# Patient Record
Sex: Male | Born: 1970
Health system: Southern US, Community
[De-identification: ages and names within clinical notes are randomized; demographics above are authoritative.]

## PROBLEM LIST (undated history)

## (undated) DIAGNOSIS — K4090 Unilateral inguinal hernia, without obstruction or gangrene, not specified as recurrent: Secondary | ICD-10-CM

## (undated) DIAGNOSIS — R51 Headache: Secondary | ICD-10-CM

## (undated) DIAGNOSIS — M1990 Unspecified osteoarthritis, unspecified site: Secondary | ICD-10-CM

## (undated) DIAGNOSIS — T4145XA Adverse effect of unspecified anesthetic, initial encounter: Secondary | ICD-10-CM

## (undated) DIAGNOSIS — T8859XA Other complications of anesthesia, initial encounter: Secondary | ICD-10-CM

## (undated) DIAGNOSIS — F329 Major depressive disorder, single episode, unspecified: Secondary | ICD-10-CM

## (undated) DIAGNOSIS — E785 Hyperlipidemia, unspecified: Secondary | ICD-10-CM

## (undated) DIAGNOSIS — J189 Pneumonia, unspecified organism: Secondary | ICD-10-CM

## (undated) DIAGNOSIS — F419 Anxiety disorder, unspecified: Secondary | ICD-10-CM

## (undated) DIAGNOSIS — R519 Headache, unspecified: Secondary | ICD-10-CM

## (undated) DIAGNOSIS — C184 Malignant neoplasm of transverse colon: Secondary | ICD-10-CM

## (undated) DIAGNOSIS — E559 Vitamin D deficiency, unspecified: Secondary | ICD-10-CM

## (undated) DIAGNOSIS — K219 Gastro-esophageal reflux disease without esophagitis: Secondary | ICD-10-CM

## (undated) DIAGNOSIS — C189 Malignant neoplasm of colon, unspecified: Secondary | ICD-10-CM

## (undated) DIAGNOSIS — Z800 Family history of malignant neoplasm of digestive organs: Secondary | ICD-10-CM

## (undated) DIAGNOSIS — F32A Depression, unspecified: Secondary | ICD-10-CM

## (undated) DIAGNOSIS — I10 Essential (primary) hypertension: Secondary | ICD-10-CM

## (undated) DIAGNOSIS — J439 Emphysema, unspecified: Secondary | ICD-10-CM

## (undated) HISTORY — PX: SIGMOIDOSCOPY: SUR1295

## (undated) HISTORY — DX: Emphysema, unspecified: J43.9

## (undated) HISTORY — PX: UPPER GASTROINTESTINAL ENDOSCOPY: SHX188

## (undated) HISTORY — PX: INGUINAL HERNIA REPAIR: SUR1180

## (undated) HISTORY — DX: Depression, unspecified: F32.A

## (undated) HISTORY — PX: COLONOSCOPY: SHX174

## (undated) HISTORY — DX: Malignant neoplasm of colon, unspecified: C18.9

## (undated) HISTORY — DX: Essential (primary) hypertension: I10

## (undated) HISTORY — PX: BACK SURGERY: SHX140

## (undated) HISTORY — DX: Gastro-esophageal reflux disease without esophagitis: K21.9

## (undated) HISTORY — DX: Family history of malignant neoplasm of digestive organs: Z80.0

## (undated) HISTORY — DX: Hyperlipidemia, unspecified: E78.5

## (undated) HISTORY — PX: POLYPECTOMY: SHX149

## (undated) HISTORY — DX: Anxiety disorder, unspecified: F41.9

## (undated) HISTORY — PX: APPENDECTOMY: SHX54

## (undated) HISTORY — DX: Unilateral inguinal hernia, without obstruction or gangrene, not specified as recurrent: K40.90

## (undated) HISTORY — PX: UPPER GI ENDOSCOPY: SHX6162

## (undated) HISTORY — DX: Major depressive disorder, single episode, unspecified: F32.9

---

## 1996-12-13 DIAGNOSIS — C189 Malignant neoplasm of colon, unspecified: Secondary | ICD-10-CM

## 1996-12-13 HISTORY — PX: OTHER SURGICAL HISTORY: SHX169

## 1996-12-13 HISTORY — DX: Malignant neoplasm of colon, unspecified: C18.9

## 1999-01-27 ENCOUNTER — Encounter: Payer: Self-pay | Admitting: Gastroenterology

## 2001-03-24 ENCOUNTER — Ambulatory Visit (HOSPITAL_COMMUNITY): Admission: RE | Admit: 2001-03-24 | Discharge: 2001-03-24 | Payer: Self-pay | Admitting: Gastroenterology

## 2001-03-24 ENCOUNTER — Encounter: Payer: Self-pay | Admitting: Gastroenterology

## 2010-08-21 ENCOUNTER — Encounter: Payer: Self-pay | Admitting: Gastroenterology

## 2010-09-22 ENCOUNTER — Encounter (INDEPENDENT_AMBULATORY_CARE_PROVIDER_SITE_OTHER): Payer: Self-pay | Admitting: *Deleted

## 2010-09-22 ENCOUNTER — Ambulatory Visit: Payer: Self-pay | Admitting: Gastroenterology

## 2010-09-22 DIAGNOSIS — R197 Diarrhea, unspecified: Secondary | ICD-10-CM | POA: Insufficient documentation

## 2010-09-22 DIAGNOSIS — Z85038 Personal history of other malignant neoplasm of large intestine: Secondary | ICD-10-CM | POA: Insufficient documentation

## 2010-09-23 ENCOUNTER — Ambulatory Visit: Payer: Self-pay | Admitting: Gastroenterology

## 2010-09-25 ENCOUNTER — Encounter: Payer: Self-pay | Admitting: Gastroenterology

## 2011-01-12 NOTE — Letter (Signed)
Summary: [facility] Instructions  [facility]  [address] [place], [place] [address]   Phone: [phone]  Fax: [phone]       Charles Santos    [birth year]    MRN: [id number]        Procedure Day /Date:09/23/10  WED     Arrival Time:730 am     Procedure Time:830 am     Location of Procedure:                    X   Endoscopy Center (4th Floor)                         PREPARATION FOR COLONOSCOPY WITH MOVIPREP   Starting 5 days prior to your procedure TODAY do not eat nuts, seeds, popcorn, corn, beans, peas,  salads, or any raw vegetables.  Do not take any fiber supplements (e.g. Metamucil, Citrucel, and Benefiber).  THE DAY BEFORE YOUR PROCEDURE         DATE: 09/22/10  DAY: TUE  1.  Drink clear liquids the entire day-NO SOLID FOOD  2.  Do not drink anything colored red or purple.  Avoid juices with pulp.  No orange juice.  3.  Drink at least 64 oz. (8 glasses) of fluid/clear liquids during the day to prevent dehydration and help the prep work efficiently.  CLEAR LIQUIDS INCLUDE: Water Jello Ice Popsicles Tea (sugar ok, no milk/cream) Powdered fruit flavored drinks Coffee (sugar ok, no milk/cream) Gatorade Juice: apple, white grape, white cranberry  Lemonade Clear bullion, consomm, broth Carbonated beverages (any kind) Strained chicken noodle soup Hard Candy                             4.  In the morning, mix first dose of MoviPrep solution:    Empty 1 Pouch A and 1 Pouch B into the disposable container    Add lukewarm drinking water to the top line of the container. Mix to dissolve    Refrigerate (mixed solution should be used within 24 hrs)  5.  Begin drinking the prep at 5:00 p.m. The MoviPrep container is divided by 4 marks.   Every 15 minutes drink the solution down to the next mark (approximately 8 oz) until the full liter is complete.   6.  Follow completed prep with 16 oz of clear liquid of your choice (Nothing red or  purple).  Continue to drink clear liquids until bedtime.  7.  Before going to bed, mix second dose of MoviPrep solution:    Empty 1 Pouch A and 1 Pouch B into the disposable container    Add lukewarm drinking water to the top line of the container. Mix to dissolve    Refrigerate  THE DAY OF YOUR PROCEDURE      DATE: 09/23/10 DAY: WED  Beginning at 330 a.m. (5 hours before procedure):         1. Every 15 minutes, drink the solution down to the next mark (approx 8 oz) until the full liter is complete.  2. Follow completed prep with 16 oz. of clear liquid of your choice.    3. You may drink clear liquids until 630 am (2 HOURS BEFORE PROCEDURE).   MEDICATION INSTRUCTIONS  Unless otherwise instructed, you should take regular prescription medications with a small sip of water   as early as possible the morning of your procedure.  OTHER INSTRUCTIONS  You will need a responsible adult at least 39 years of age to accompany you and drive you home.   This person must remain in the waiting room during your procedure.  Wear loose fitting clothing that is easily removed.  Leave jewelry and other valuables at home.  However, you may wish to bring a book to read or  an iPod/MP3 player to listen to music as you wait for your procedure to start.  Remove all body piercing jewelry and leave at home.  Total time from sign-in until discharge is approximately 2-3 hours.  You should go home directly after your procedure and rest.  You can resume normal activities the  day after your procedure.  The day of your procedure you should not:   Drive   Make legal decisions   Operate machinery   Drink alcohol   Return to work  You will receive specific instructions about eating, activities and medications before you leave.    The above instructions have been reviewed and explained to me by   _______________________    I fully understand and can verbalize these  instructions _____________________________ Date _________

## 2011-01-12 NOTE — Procedures (Signed)
Summary: Colonoscopy/[facility]  Colonoscopy/[facility]   Imported By: Sherian Rein 10/07/2010 09:36:31  _____________________________________________________________________  External Attachment:    Type:   Image     Comment:   External Document

## 2011-01-12 NOTE — Miscellaneous (Signed)
Summary: rx  Clinical Lists Changes  Medications: Removed medication of MOVIPREP 100 GM  SOLR (PEG-KCL-NACL-NASULF-NA ASC-C) As per prep instructions. Added new medication of CHOLESTYRAMINE   POWD (CHOLESTYRAMINE) take 4gram scoop of powder every morning - Signed Rx of CHOLESTYRAMINE   POWD (CHOLESTYRAMINE) take 4gram scoop of powder every morning;  #1 month x 11;  Signed;  Entered by: Rachael Fee MD;  Authorized by: Rachael Fee MD;  Method used: Electronically to [facility] [phone]*, [address] [place], [place], [place]  [address], Ph: [phone] or [phone], Fax: [phone]    Prescriptions: CHOLESTYRAMINE   POWD (CHOLESTYRAMINE) take 4gram scoop of powder every morning  #1 month x 11   Entered and Authorized by:   Rachael Fee MD   Signed by:   Rachael Fee MD on 09/23/2010   Method used:   Electronically to        [facility] [phone]* (retail)       [address] [place], [place]  [address]       Ph: [phone] or [phone]       Fax: [phone]   RxID:   [id number]

## 2011-01-12 NOTE — Letter (Signed)
Summary: New Patient letter  [facility]  [address] [place], [place] [address]   Phone: [phone]  Fax: [phone]       08/21/2010 MRN: [id number]  Charles Santos [address] [place], [place]  [address]  Dear Mr. Charles Santos,  Welcome to the Gastroenterology Division at [facility].    You are scheduled to see Dr.  Rob Bunting on Sep 22, 2010 at 9am on the 3rd floor at [facility], [address]  We ask that you try to arrive at our office 15 minutes prior to your appointment time to allow for check-in.  We would like you to complete the enclosed self-administered evaluation form prior to your visit and bring it with you on the day of your appointment.  We will review it with you.  Also, please bring a complete list of all your medications or, if you prefer, bring the medication bottles and we will list them.  Please bring your insurance card so that we may make a copy of it.  If your insurance requires a referral to see a specialist, please bring your referral form from your primary care physician.  Co-payments are due at the time of your visit and may be paid by cash, check or credit card.     Your office visit will consist of a consult with your physician (includes a physical exam), any laboratory testing he/she may order, scheduling of any necessary diagnostic testing (e.g. x-ray, ultrasound, CT-scan), and scheduling of a procedure (e.g. Endoscopy, Colonoscopy) if required.  Please allow enough time on your schedule to allow for any/all of these possibilities.    If you cannot keep your appointment, please call [phone] to cancel or reschedule prior to your appointment date.  This allows [place] the opportunity to schedule an appointment for another patient in need of care.  If you do not cancel or reschedule by 5 p.m. the business day prior to your appointment date, you will be charged a $50.00 late cancellation/no-show fee.    Thank you for choosing  [facility] for your medical needs.  We appreciate the opportunity to care for you.  Please visit [place] at our website  to learn more about our practice.                     Sincerely,                                                             The Gastroenterology Division

## 2011-01-12 NOTE — Letter (Signed)
Summary: [facility]   Imported By: Sherian Rein 10/07/2010 09:35:18  _____________________________________________________________________  External Attachment:    Type:   Image     Comment:   External Document

## 2011-01-12 NOTE — Letter (Signed)
Summary: Results Letter  [facility]  [address] [place], [place] [address]   Phone: [phone]  Fax: [phone]        September 25, 2010 MRN: [id number]    Charles Santos [address] [place], [place]  [address]    Dear [name],   At least one of the polyps removed during your recent procedure was proven to be adenomatous.  These are pre-cancerous polyps that may have grown into cancers if they had not been removed.  Based on current nationally recognized surveillance guidelines, I recommend that you have a repeat colonoscopy in 5 years.  These are the same recommendations (5 year interval) for personal history of colon cancer.  The biopsies I took during your colonoscopy (to check for microscopic colitis) were all normal.  You should continue to follow the recommendations that we discussed at the time of your procedure, that is to continue taking the cholestyramine powder once daily.  I look forward to seeing you in the office in 4-5 weeks to see how this is helping your chronic diarrhea.     Sincerely,  Rachael Fee MD  This letter has been electronically signed by your physician.  Appended Document: Results Letter letter mailed

## 2011-01-12 NOTE — Procedures (Signed)
Summary: Colonoscopy  Patient: Charles Santos Note: All result statuses are Final unless otherwise noted.  Tests: (1) Colonoscopy (COL)   COL Colonoscopy           DONE     [facility]     [address]     [place], [place]  [address]           COLONOSCOPY PROCEDURE REPORT     PATIENT:  [name], [name]  MR#:  [id number]     BIRTHDATE:  12/29/1970, 39 yrs. old  GENDER:  male     ENDOSCOPIST:  Rachael Fee, MD     REF. BY:  Rudi Heap, M.D.     PROCEDURE DATE:  09/23/2010     PROCEDURE:  Colonoscopy with snare polypectomy     ASA CLASS:  Class II     INDICATIONS:  diarrhea, personal history of colon cancer at age 71     (s/p resection and chemo in [place])     MEDICATIONS:   Fentanyl 75 mcg IV, Versed 7 mg IV     DESCRIPTION OF PROCEDURE:   After the risks benefits and     alternatives of the procedure were thoroughly explained, informed     consent was obtained.  Digital rectal exam was performed and     revealed no rectal masses.   The LB [id number] endoscope     was introduced through the anus and advanced to the anastomosis,     without limitations.  The quality of the prep was good, using     MoviPrep.  The instrument was then slowly withdrawn as the colon     was fully examined.     <<PROCEDUREIMAGES>>     FINDINGS:  There were three small sessile polyps, all were removed     with cold snare and all were sent to pathology (jar 2). These     ranged in size from 3mm to 4mm and were located in descending,     sigmoid, and rectal segments (see image5 and image4). There     appeared to be a right hemicolectomy anastomosis without any     adenomatous appearing mucosa (see image3 and image2).  This was     otherwise a normal examination of the colon. Random colon biopsies     were performed (pathology jar 1) (see image6).   Retroflexed views     in the rectum revealed no abnormalities.    The scope was then     withdrawn from the patient and the procedure  completed.     COMPLICATIONS:  None           ENDOSCOPIC IMPRESSION:     1) Three small polyps, all removed and all sent to pathology     2) Right hemicolectomy anastomosis appeared normal     3) Otherwise normal examination of the colon; random biopsies     performed to check for microscopic colitis     RECOMMENDATIONS:     1) Given your significant personal history of colon cancer, you     should have a repeat colonoscopy in  5 years even if the polyps     removed today are NOT precancerous.     2) You will receive a letter within 1-2 weeks with the results     of your biopsy as well as final recommendations. Please call my     office if you have not received a letter after 3 weeks.  3) Chronic loose stools likely related to abnormal bile salt     uptake due to right hemicolectomy. Will start trial of     cholestyramine powder 4gram once daily (called into pharmacy).     Call Dr. Christella Hartigan' office in 4-5 weeks to report on whether this is     helping or not.           ______________________________     Rachael Fee, MD           n.     eSIGNED:   Rachael Fee at 09/23/2010 08:51 AM           Nickolas Madrid, [id number]  Note: An exclamation mark (!) indicates a result that was not dispersed into the flowsheet. Document Creation Date: 09/23/2010 8:51 AM _______________________________________________________________________  (1) Order result status: Final Collection or observation date-time: 09/23/2010 08:42 Requested date-time:  Receipt date-time:  Reported date-time:  Referring Physician:   Ordering Physician: Rob Bunting [phone]) Specimen Source:  Source: Launa Grill Order Number: [id number] Lab site:   Appended Document: Colonoscopy     Procedures Next Due Date:    Colonoscopy: 09/2015

## 2011-01-12 NOTE — Assessment & Plan Note (Signed)
History of Present Illness Visit Type: consult  Primary GI MD: Rob Bunting MD Primary Provider: Ernestina Penna, MD Requesting Provider: Ernestina Penna, MD  Chief Complaint: chronic diarrhea  History of Present Illness:     very pleasant 40 year old man who is here with his wife today.  He has had diarrhea for "something" like 1-2 years.  He had colon cancer when he was 26.  This was removed by surgery, sounds right sided. He underwent 6 months of chemo.  Presented with sharp pains in his right side.  This was in [place].  Took a while to ultimately diagnose.    This was in [facility], in [place].  He had colonoscopies for follow up. The last was Dr. Melchor Amour about 9 years ago.    He has 6 loose stools a day, sometimes seven.  He has been dieting, losing weight.  HAs a brisk gastrocolic reflux.  No nocturnal diarrhea, no overt bleeding.  has not tried immodium.           Current Medications (verified): 1)  Antara 130 Mg Caps (Fenofibrate Micronized) .... One Capsule By Mouth Once Daily  Allergies (verified): No Known Drug Allergies  Past History:  Past Medical History: colon cancer at age 61, [place], resected and chemotherapy Elevated cholesterol Sleep apnea  Past Surgical History: colon cancer resection  Family History: diabetes  Social History: he is married, he has 3 children, he is a International aid/development worker, he currently smokes one pack of cigarettes a day, he does not drink alcohol, he drinks at least 10 caffeinated beverages a day  Review of Systems       Pertinent positive and negative review of systems were noted in the above HPI and GI specific review of systems.  All other review of systems was otherwise negative.   Vital Signs:  Patient profile:   40 year old male Height:      70 inches Weight:      204 pounds BMI:     29.38 BSA:     2.11 Pulse rate:   76 / minute Pulse rhythm:   regular BP sitting:   128 / 60  (left arm) Cuff  size:   regular  Vitals Entered By: Ok Anis CMA (September 22, 2010 8:38 AM)  Physical Exam  Additional Exam:  Constitutional: generally well appearing Psychiatric: alert and oriented times 3 Eyes: extraocular movements intact Mouth: oropharynx moist, no lesions Neck: supple, no lymphadenopathy Cardiovascular: heart regular rate and rythm Lungs: CTA bilaterally Abdomen: soft, Well-healed midline scar,non-tender, non-distended, no obvious ascites, no peritoneal signs, normal bowel sounds Extremities: no lower extremity edema bilaterally Skin: no lesions on visible extremities    Impression & Recommendations:  Problem # 1:  personal history of colon cancer we will get records from his [place] cancer care Center here for review. He is due for colonoscopy since the standard of care for personal history of colon cancer is a colonoscopy at least every 5 years. We will schedule that at his soonest convenience  Problem # 2:  diarrhea smoking cigarettes and drinking an exorbitant amount of caffeine every day likely contributes to his diarrhea. I recommended he try cutting back on his caffeine. After his colonoscopy I will suggest Imodium  Patient Instructions: 1)  One of your biggest health concerns is your smoking.  You should try your absolute best to stop.  If you need assistence, please contact your PCP or Smoking Cessation Class at [facility] [phone]) or  [facility] [facility] [place] Quit-Line (1-800-QUIT-NOW). 2)  Caffeine can cause diarrhea, you drink a lot of caffeine and cutting back may help your loose stools. 3)  You will be scheduled to have a colonoscopy. 4)  We will track down reports from Dr. Melchor Amour colonoscopy from 8-9 years ago AND also from your surgery/cancer treatment in [place]. 5)  The medication list was reviewed and reconciled.  All changed / newly prescribed medications were explained.  A complete medication list was provided to the patient / caregiver.  Appended Document:  Orders Update/Movi    Clinical Lists Changes  Problems: Added new problem of PERSONAL HISTORY MALIG NEOPLASM LARGE INTESTINE (ICD-V10.05) Added new problem of DIARRHEA (ICD-787.91) Medications: Added new medication of MOVIPREP 100 GM  SOLR (PEG-KCL-NACL-NASULF-NA ASC-C) As per prep instructions. - Signed Rx of MOVIPREP 100 GM  SOLR (PEG-KCL-NACL-NASULF-NA ASC-C) As per prep instructions.;  #1 x 0;  Signed;  Entered by: Chales Abrahams CMA (AAMA);  Authorized by: Rachael Fee MD;  Method used: Electronically to [facility] [phone]*, [address] [place], [place], [place]  [address], Ph: [phone] or [phone], Fax: [phone] Orders: Added new Test order of Colonoscopy (Colon) - Signed    Prescriptions: MOVIPREP 100 GM  SOLR (PEG-KCL-NACL-NASULF-NA ASC-C) As per prep instructions.  #1 x 0   Entered by:   Chales Abrahams CMA (AAMA)   Authorized by:   Rachael Fee MD   Signed by:   Chales Abrahams CMA (AAMA) on 09/22/2010   Method used:   Electronically to        [facility] [phone]* (retail)       [address] [place], [place]  [address]       Ph: [phone] or [phone]       Fax: [phone]   RxID:   [id number]

## 2011-05-05 ENCOUNTER — Encounter: Payer: Self-pay | Admitting: Nurse Practitioner

## 2012-03-31 ENCOUNTER — Other Ambulatory Visit: Payer: Self-pay | Admitting: Family Medicine

## 2012-03-31 ENCOUNTER — Ambulatory Visit (HOSPITAL_COMMUNITY)
Admission: RE | Admit: 2012-03-31 | Discharge: 2012-03-31 | Disposition: A | Discharge status: Home / Self Care | Payer: PRIVATE HEALTH INSURANCE | Source: Ambulatory Visit | Attending: Family Medicine | Admitting: Family Medicine

## 2012-03-31 DIAGNOSIS — N50819 Testicular pain, unspecified: Secondary | ICD-10-CM

## 2012-03-31 DIAGNOSIS — N509 Disorder of male genital organs, unspecified: Secondary | ICD-10-CM | POA: Insufficient documentation

## 2012-03-31 DIAGNOSIS — N508 Other specified disorders of male genital organs: Secondary | ICD-10-CM | POA: Insufficient documentation

## 2012-03-31 DIAGNOSIS — N433 Hydrocele, unspecified: Secondary | ICD-10-CM | POA: Insufficient documentation

## 2013-10-17 ENCOUNTER — Encounter: Payer: Self-pay | Admitting: Family Medicine

## 2013-10-17 ENCOUNTER — Ambulatory Visit (INDEPENDENT_AMBULATORY_CARE_PROVIDER_SITE_OTHER): Payer: 59 | Admitting: Family Medicine

## 2013-10-17 VITALS — BP 113/60 | HR 78 | Temp 98.0°F | Ht 69.0 in | Wt 201.0 lb

## 2013-10-17 DIAGNOSIS — Z716 Tobacco abuse counseling: Secondary | ICD-10-CM

## 2013-10-17 DIAGNOSIS — Z0000 Encounter for general adult medical examination without abnormal findings: Secondary | ICD-10-CM

## 2013-10-17 DIAGNOSIS — Z23 Encounter for immunization: Secondary | ICD-10-CM

## 2013-10-17 DIAGNOSIS — Z136 Encounter for screening for cardiovascular disorders: Secondary | ICD-10-CM

## 2013-10-17 DIAGNOSIS — Z7189 Other specified counseling: Secondary | ICD-10-CM

## 2013-10-17 DIAGNOSIS — F17200 Nicotine dependence, unspecified, uncomplicated: Secondary | ICD-10-CM

## 2013-10-17 LAB — POCT CBC
Hemoglobin: 17.4 g/dL (ref 14.1–18.1)
Lymph, poc: 1.4 (ref 0.6–3.4)
MCH, POC: 33 pg — AB (ref 27–31.2)
MCHC: 34.4 g/dL (ref 31.8–35.4)
MCV: 96.1 fL (ref 80–97)
POC Granulocyte: 5.7 (ref 2–6.9)
POC LYMPH PERCENT: 18.5 %L (ref 10–50)
Platelet Count, POC: 188 10*3/uL (ref 142–424)
RBC: 5.3 M/uL (ref 4.69–6.13)

## 2013-10-17 NOTE — Progress Notes (Signed)
New Patient History and Physical  Patient name: Charles Santos Medical record number: [id number] Date of birth: 1971/07/28 Age: 42 y.o. Gender: male  Primary Care Provider: Rudi Heap, MD  Chief Complaint: Annual Exam  History of Present Illness: Patient presents today for an annual exam. Patient denies any acute issues or concerns. Patient does report a baseline history of familial colon cancer status post resection in 1998 and recent polyp removal in 2012.   Past Medical History: Patient Active Problem List   Diagnosis Date Noted  . DIARRHEA 09/22/2010  . PERSONAL HISTORY MALIG NEOPLASM LARGE INTESTINE 09/22/2010   Past Medical History  Diagnosis Date  . Hyperlipidemia   . Family hx of colon cancer   . Colon cancer 1998    Past Surgical History: Past Surgical History  Procedure Laterality Date  . Colon resectomy  1998    2 degree herida  . Appendectomy      Social History: History   Social History  . Marital Status: Legally Separated    Spouse Name: N/A    Number of Children: N/A  . Years of Education: N/A   Social History Main Topics  . Smoking status: Current Every Day Smoker -- 1.25 packs/day for 23 years    Types: Cigarettes  . Smokeless tobacco: Never Used  . Alcohol Use: Yes     Comment: occ  . Drug Use: No  . Sexual Activity: None   Other Topics Concern  . None   Social History Narrative  . None    Family History: Family History  Problem Relation Age of Onset  . Family history unknown: Yes    Allergies: No Known Allergies  No current outpatient prescriptions on file.   No current facility-administered medications for this visit.   Review Of Systems: 12 point ROS negative except as noted above in HPI.  Physical Exam: Filed Vitals:   10/17/13 0909  BP: 113/60  Pulse: 78  Temp: 98 F (36.7 C)    General: alert and cooperative HEENT: PERRLA and extra ocular movement intact Heart: S1, S2 normal, no murmur, rub or gallop, regular  rate and rhythm Lungs: clear to auscultation, no wheezes or rales and unlabored breathing Abdomen: abdomen is soft without significant tenderness, masses, organomegaly or guarding Extremities: extremities normal, atraumatic, no cyanosis or edema Skin:no rashes, no ecchymoses Neurology: normal without focal findings  Labs and Imaging:  Assessment and Plan: Orders Placed This Encounter  Procedures  . TSH  . Lipid panel  . POCT CBC  . POCT A1C   Check risk stratification labs Will need regular follow up with GI Follow up pending blood work.        Doree Albee MD

## 2013-10-17 NOTE — Patient Instructions (Signed)
Smoking Cessation Quitting smoking is important to your health and has many advantages. However, it is not always easy to quit since nicotine is a very addictive drug. Often times, people try 3 times or more before being able to quit. This document explains the best ways for you to prepare to quit smoking. Quitting takes hard work and a lot of effort, but you can do it. ADVANTAGES OF QUITTING SMOKING  You will live longer, feel better, and live better.  Your body will feel the impact of quitting smoking almost immediately.  Within 20 minutes, blood pressure decreases. Your pulse returns to its normal level.  After 8 hours, carbon monoxide levels in the blood return to normal. Your oxygen level increases.  After 24 hours, the chance of having a heart attack starts to decrease. Your breath, hair, and body stop smelling like smoke.  After 48 hours, damaged nerve endings begin to recover. Your sense of taste and smell improve.  After 72 hours, the body is virtually free of nicotine. Your bronchial tubes relax and breathing becomes easier.  After 2 to 12 weeks, lungs can hold more air. Exercise becomes easier and circulation improves.  The risk of having a heart attack, stroke, cancer, or lung disease is greatly reduced.  After 1 year, the risk of coronary heart disease is cut in half.  After 5 years, the risk of stroke falls to the same as a nonsmoker.  After 10 years, the risk of lung cancer is cut in half and the risk of other cancers decreases significantly.  After 15 years, the risk of coronary heart disease drops, usually to the level of a nonsmoker.  If you are pregnant, quitting smoking will improve your chances of having a healthy baby.  The people you live with, especially any children, will be healthier.  You will have extra money to spend on things other than cigarettes. QUESTIONS TO THINK ABOUT BEFORE ATTEMPTING TO QUIT You may want to talk about your answers with your  caregiver.  Why do you want to quit?  If you tried to quit in the past, what helped and what did not?  What will be the most difficult situations for you after you quit? How will you plan to handle them?  Who can help you through the tough times? Your family? Friends? A caregiver?  What pleasures do you get from smoking? What ways can you still get pleasure if you quit? Here are some questions to ask your caregiver:  How can you help me to be successful at quitting?  What medicine do you think would be best for me and how should I take it?  What should I do if I need more help?  What is smoking withdrawal like? How can I get information on withdrawal? GET READY  Set a quit date.  Change your environment by getting rid of all cigarettes, ashtrays, matches, and lighters in your home, car, or work. Do not let people smoke in your home.  Review your past attempts to quit. Think about what worked and what did not. GET SUPPORT AND ENCOURAGEMENT You have a better chance of being successful if you have help. You can get support in many ways.  Tell your family, friends, and co-workers that you are going to quit and need their support. Ask them not to smoke around you.  Get individual, group, or telephone counseling and support. Programs are available at local hospitals and health centers. Call your local health department for   information about programs in your area.  Spiritual beliefs and practices may help some smokers quit.  Download a "quit meter" on your computer to keep track of quit statistics, such as how long you have gone without smoking, cigarettes not smoked, and money saved.  Get a self-help book about quitting smoking and staying off of tobacco. LEARN NEW SKILLS AND BEHAVIORS  Distract yourself from urges to smoke. Talk to someone, go for a walk, or occupy your time with a task.  Change your normal routine. Take a different route to work. Drink tea instead of coffee.  Eat breakfast in a different place.  Reduce your stress. Take a hot bath, exercise, or read a book.  Plan something enjoyable to do every day. Reward yourself for not smoking.  Explore interactive web-based programs that specialize in helping you quit. GET MEDICINE AND USE IT CORRECTLY Medicines can help you stop smoking and decrease the urge to smoke. Combining medicine with the above behavioral methods and support can greatly increase your chances of successfully quitting smoking.  Nicotine replacement therapy helps deliver nicotine to your body without the negative effects and risks of smoking. Nicotine replacement therapy includes nicotine gum, lozenges, inhalers, nasal sprays, and skin patches. Some may be available over-the-counter and others require a prescription.  Antidepressant medicine helps people abstain from smoking, but how this works is unknown. This medicine is available by prescription.  Nicotinic receptor partial agonist medicine simulates the effect of nicotine in your brain. This medicine is available by prescription. Ask your caregiver for advice about which medicines to use and how to use them based on your health history. Your caregiver will tell you what side effects to look out for if you choose to be on a medicine or therapy. Carefully read the information on the package. Do not use any other product containing nicotine while using a nicotine replacement product.  RELAPSE OR DIFFICULT SITUATIONS Most relapses occur within the first 3 months after quitting. Do not be discouraged if you start smoking again. Remember, most people try several times before finally quitting. You may have symptoms of withdrawal because your body is used to nicotine. You may crave cigarettes, be irritable, feel very hungry, cough often, get headaches, or have difficulty concentrating. The withdrawal symptoms are only temporary. They are strongest when you first quit, but they will go away within  10 14 days. To reduce the chances of relapse, try to:  Avoid drinking alcohol. Drinking lowers your chances of successfully quitting.  Reduce the amount of caffeine you consume. Once you quit smoking, the amount of caffeine in your body increases and can give you symptoms, such as a rapid heartbeat, sweating, and anxiety.  Avoid smokers because they can make you want to smoke.  Do not let weight gain distract you. Many smokers will gain weight when they quit, usually less than 10 pounds. Eat a healthy diet and stay active. You can always lose the weight gained after you quit.  Find ways to improve your mood other than smoking. FOR MORE INFORMATION  www.smokefree.gov  Document Released: 11/23/2001 Document Revised: 05/30/2012 Document Reviewed: 03/09/2012 [employer] Patient Information 2014 [employer].  

## 2013-10-18 LAB — LIPID PANEL
Chol/HDL Ratio: 3.3 ratio units (ref 0.0–5.0)
Cholesterol, Total: 199 mg/dL (ref 100–199)
LDL Calculated: 108 mg/dL — ABNORMAL HIGH (ref 0–99)
Triglycerides: 156 mg/dL — ABNORMAL HIGH (ref 0–149)
VLDL Cholesterol Cal: 31 mg/dL (ref 5–40)

## 2013-10-29 ENCOUNTER — Encounter: Payer: Self-pay | Admitting: Family Medicine

## 2013-10-29 ENCOUNTER — Ambulatory Visit (INDEPENDENT_AMBULATORY_CARE_PROVIDER_SITE_OTHER): Payer: 59 | Admitting: Family Medicine

## 2013-10-29 ENCOUNTER — Telehealth: Payer: Self-pay | Admitting: Family Medicine

## 2013-10-29 VITALS — BP 135/77 | HR 80 | Temp 97.3°F | Ht 69.0 in | Wt 201.0 lb

## 2013-10-29 DIAGNOSIS — R05 Cough: Secondary | ICD-10-CM

## 2013-10-29 DIAGNOSIS — R059 Cough, unspecified: Secondary | ICD-10-CM

## 2013-10-29 DIAGNOSIS — J209 Acute bronchitis, unspecified: Secondary | ICD-10-CM

## 2013-10-29 LAB — POCT INFLUENZA A/B
Influenza A, POC: NEGATIVE
Influenza B, POC: NEGATIVE

## 2013-10-29 MED ORDER — AMOXICILLIN 875 MG PO TABS: 875.0000 mg | ORAL_TABLET | Freq: Two times a day (BID) | ORAL | Status: DC

## 2013-10-29 MED ORDER — BENZONATATE 200 MG PO CAPS: 200.0000 mg | ORAL_CAPSULE | Freq: Two times a day (BID) | ORAL | Status: DC | PRN

## 2013-10-29 NOTE — Telephone Encounter (Signed)
appt today at 1:00 with [facility]

## 2013-10-29 NOTE — Patient Instructions (Signed)

## 2013-10-29 NOTE — Progress Notes (Signed)
  Subjective:    Patient ID: Charles Santos, male    DOB: Jul 03, 1971, 42 y.o.   MRN: [id number]  HPI This 41 y.o. male presents for evaluation of URI sx's for over a week.  He is coughing And has congestion.  He states he has been wheezing and he is coughing up  Thick mucopurulent sputum.   Review of Systems C/o cough and congestion. No chest pain, SOB, HA, dizziness, vision change, N/V, diarrhea, constipation, dysuria, urinary urgency or frequency, myalgias, arthralgias or rash.     Objective:   Physical Exam  Vital signs noted  Well developed well nourished male.  HEENT - Head atraumatic Normocephalic                Eyes - PERRLA, Conjuctiva - clear Sclera- Clear EOMI                Ears - EAC's Wnl TM's Wnl Gross Hearing WNL                Nose - Nares patent                 Throat - oropharanx wnl Respiratory - Lungs CTA bilateral Cardiac - RRR S1 and S2 without murmur GI - Abdomen soft Nontender and bowel sounds active x 4 Extremities - No edema. Neuro - Grossly intact.      Assessment & Plan:  Cough - Plan: POCT Influenza A/B, amoxicillin (AMOXIL) 875 MG tablet, benzonatate (TESSALON) 200 MG capsule  Acute bronchitis - Plan: amoxicillin (AMOXIL) 875 MG tablet, benzonatate (TESSALON) 200 MG capsule  Deatra Canter FNP

## 2014-09-10 ENCOUNTER — Encounter: Payer: Self-pay | Admitting: Gastroenterology

## 2015-01-24 ENCOUNTER — Other Ambulatory Visit: Payer: 59 | Admitting: Family

## 2015-09-24 ENCOUNTER — Ambulatory Visit (INDEPENDENT_AMBULATORY_CARE_PROVIDER_SITE_OTHER): Payer: BLUE CROSS/BLUE SHIELD | Admitting: *Deleted

## 2015-09-24 DIAGNOSIS — Z23 Encounter for immunization: Secondary | ICD-10-CM

## 2015-09-29 ENCOUNTER — Encounter: Payer: Self-pay | Admitting: Gastroenterology

## 2015-10-16 ENCOUNTER — Encounter: Payer: Self-pay | Admitting: Family

## 2015-10-16 ENCOUNTER — Ambulatory Visit (INDEPENDENT_AMBULATORY_CARE_PROVIDER_SITE_OTHER): Payer: BLUE CROSS/BLUE SHIELD | Admitting: Family

## 2015-10-16 VITALS — BP 128/85 | HR 74 | Temp 97.3°F | Ht 69.0 in | Wt 198.0 lb

## 2015-10-16 DIAGNOSIS — Z85038 Personal history of other malignant neoplasm of large intestine: Secondary | ICD-10-CM

## 2015-10-16 DIAGNOSIS — Z0000 Encounter for general adult medical examination without abnormal findings: Secondary | ICD-10-CM | POA: Diagnosis not present

## 2015-10-16 NOTE — Progress Notes (Signed)
   Subjective:    Patient ID: Charles Santos, male    DOB: 04-Oct-1971, 44 y.o.   MRN: [id number]  HPI Pt presents to the office to the office today for CPE. Pt currently not taking any medications at this time. Pt states she has bilateral heel pain at times. Pt states he works 12 hour shifts in [employer]. Pt states he needs a referral for a colonoscopy today. Pt states he is suppose to have a colonoscopy every 2 years and it has been over 3 years. Pt states he has a history of colon cancer and carries the genetic gene.  Pt denies any headache, palpitations, SOB, or edema at this time.     Review of Systems  Constitutional: Negative.   HENT: Negative.   Respiratory: Negative.   Cardiovascular: Negative.   Gastrointestinal: Negative.   Endocrine: Negative.   Genitourinary: Negative.   Musculoskeletal: Negative.   Neurological: Negative.   Hematological: Negative.   Psychiatric/Behavioral: Negative.   All other systems reviewed and are negative.      Objective:   Physical Exam  Constitutional: He is oriented to person, place, and time. He appears well-developed and well-nourished. No distress.  HENT:  Head: Normocephalic.  Right Ear: External ear normal.  Left Ear: External ear normal.  Nasal passage erythemas with mild swelling  Oropharynx erythemas   Eyes: Pupils are equal, round, and reactive to light. Right eye exhibits no discharge. Left eye exhibits no discharge.  Neck: Normal range of motion. Neck supple. No thyromegaly present.  Cardiovascular: Normal rate, regular rhythm, normal heart sounds and intact distal pulses.   No murmur heard. Pulmonary/Chest: Effort normal and breath sounds normal. No respiratory distress. He has no wheezes.  Abdominal: Soft. Bowel sounds are normal. He exhibits no distension. There is no tenderness.  Musculoskeletal: Normal range of motion. He exhibits no edema or tenderness.  Neurological: He is alert and oriented to person, place, and  time. He has normal reflexes. No cranial nerve deficit.  Skin: Skin is warm and dry. No rash noted. No erythema.  Psychiatric: He has a normal mood and affect. His behavior is normal. Judgment and thought content normal.  Vitals reviewed.   BP 128/85 mmHg  Pulse 74  Temp(Src) 97.3 F (36.3 C) (Oral)  Ht 5\' 9"  (1.753 m)  Wt 198 lb (89.812 kg)  BMI 29.23 kg/m2       Assessment & Plan:

## 2015-10-16 NOTE — Patient Instructions (Signed)

## 2015-10-17 ENCOUNTER — Other Ambulatory Visit: Payer: Self-pay | Admitting: Family

## 2015-10-17 DIAGNOSIS — E785 Hyperlipidemia, unspecified: Secondary | ICD-10-CM

## 2015-10-17 DIAGNOSIS — E559 Vitamin D deficiency, unspecified: Secondary | ICD-10-CM

## 2015-10-17 LAB — LIPID PANEL
CHOLESTEROL TOTAL: 205 mg/dL — AB (ref 100–199)
Chol/HDL Ratio: 3.1 ratio units (ref 0.0–5.0)
HDL: 66 mg/dL (ref >39)
LDL Calculated: 100 mg/dL — ABNORMAL HIGH (ref 0–99)
TRIGLYCERIDES: 194 mg/dL — AB (ref 0–149)
VLDL Cholesterol Cal: 39 mg/dL (ref 5–40)

## 2015-10-17 LAB — CMP14+EGFR
ALBUMIN: 4.4 g/dL (ref 3.5–5.5)
ALT: 28 IU/L (ref 0–44)
AST: 29 IU/L (ref 0–40)
Albumin/Globulin Ratio: 1.9 (ref 1.1–2.5)
Alkaline Phosphatase: 78 IU/L (ref 39–117)
BUN/Creatinine Ratio: 18 (ref 9–20)
BUN: 17 mg/dL (ref 6–24)
Bilirubin Total: 0.7 mg/dL (ref 0.0–1.2)
CO2: 19 mmol/L (ref 18–29)
CREATININE: 0.96 mg/dL (ref 0.76–1.27)
Calcium: 8.9 mg/dL (ref 8.7–10.2)
Chloride: 102 mmol/L (ref 97–106)
GFR calc non Af Amer: 96 mL/min/{1.73_m2} (ref >59)
GFR, EST AFRICAN AMERICAN: 111 mL/min/{1.73_m2} (ref >59)
GLUCOSE: 90 mg/dL (ref 65–99)
Globulin, Total: 2.3 g/dL (ref 1.5–4.5)
Potassium: 4 mmol/L (ref 3.5–5.2)
Sodium: 138 mmol/L (ref 136–144)
TOTAL PROTEIN: 6.7 g/dL (ref 6.0–8.5)

## 2015-10-17 LAB — CBC WITH DIFFERENTIAL/PLATELET
BASOS: 1 %
Basophils Absolute: 0 10*3/uL (ref 0.0–0.2)
EOS (ABSOLUTE): 0.2 10*3/uL (ref 0.0–0.4)
EOS: 3 %
HEMATOCRIT: 50.1 % (ref 37.5–51.0)
Hemoglobin: 17.5 g/dL (ref 12.6–17.7)
IMMATURE GRANS (ABS): 0 10*3/uL (ref 0.0–0.1)
IMMATURE GRANULOCYTES: 0 %
LYMPHS: 13 %
Lymphocytes Absolute: 1 10*3/uL (ref 0.7–3.1)
MCH: 34 pg — AB (ref 26.6–33.0)
MCHC: 34.9 g/dL (ref 31.5–35.7)
MCV: 97 fL (ref 79–97)
MONOS ABS: 0.9 10*3/uL (ref 0.1–0.9)
Monocytes: 12 %
NEUTROS ABS: 5.4 10*3/uL (ref 1.4–7.0)
Neutrophils: 71 %
PLATELETS: 180 10*3/uL (ref 150–379)
RBC: 5.15 x10E6/uL (ref 4.14–5.80)
RDW: 12.9 % (ref 12.3–15.4)
WBC: 7.6 10*3/uL (ref 3.4–10.8)

## 2015-10-17 LAB — VITAMIN D 25 HYDROXY (VIT D DEFICIENCY, FRACTURES): Vit D, 25-Hydroxy: 23.9 ng/mL — ABNORMAL LOW (ref 30.0–100.0)

## 2015-10-17 LAB — THYROID PANEL WITH TSH
FREE THYROXINE INDEX: 1.8 (ref 1.2–4.9)
T3 UPTAKE RATIO: 28 % (ref 24–39)
T4, Total: 6.5 ug/dL (ref 4.5–12.0)
TSH: 1.54 u[IU]/mL (ref 0.450–4.500)

## 2015-10-17 LAB — PSA, TOTAL AND FREE
PSA FREE PCT: 45 %
PSA FREE: 0.36 ng/mL
Prostate Specific Ag, Serum: 0.8 ng/mL (ref 0.0–4.0)

## 2015-10-17 MED ORDER — VITAMIN D (ERGOCALCIFEROL) 1.25 MG (50000 UNIT) PO CAPS: 50000.0000 [IU] | ORAL_CAPSULE | ORAL | Status: DC

## 2015-10-17 MED ORDER — ATORVASTATIN CALCIUM 10 MG PO TABS: 10.0000 mg | ORAL_TABLET | Freq: Every day | ORAL | Status: DC

## 2015-10-20 ENCOUNTER — Telehealth: Payer: Self-pay | Admitting: Family

## 2015-10-20 NOTE — Telephone Encounter (Signed)
Left detailed message going over all test results per pt request. Advised to CB with any further questions and concerns.

## 2015-10-21 ENCOUNTER — Encounter (INDEPENDENT_AMBULATORY_CARE_PROVIDER_SITE_OTHER): Payer: Self-pay | Admitting: *Deleted

## 2015-12-14 HISTORY — PX: OTHER SURGICAL HISTORY: SHX169

## 2016-06-01 ENCOUNTER — Ambulatory Visit (INDEPENDENT_AMBULATORY_CARE_PROVIDER_SITE_OTHER): Payer: BLUE CROSS/BLUE SHIELD | Admitting: Family Medicine

## 2016-06-01 ENCOUNTER — Encounter: Payer: Self-pay | Admitting: Family Medicine

## 2016-06-01 ENCOUNTER — Telehealth: Payer: Self-pay | Admitting: Family

## 2016-06-01 VITALS — BP 145/82 | HR 76 | Temp 97.7°F | Ht 69.0 in | Wt 205.0 lb

## 2016-06-01 DIAGNOSIS — Z85038 Personal history of other malignant neoplasm of large intestine: Secondary | ICD-10-CM

## 2016-06-01 DIAGNOSIS — Z720 Tobacco use: Secondary | ICD-10-CM | POA: Diagnosis not present

## 2016-06-01 DIAGNOSIS — F329 Major depressive disorder, single episode, unspecified: Secondary | ICD-10-CM | POA: Diagnosis not present

## 2016-06-01 DIAGNOSIS — E785 Hyperlipidemia, unspecified: Secondary | ICD-10-CM

## 2016-06-01 DIAGNOSIS — F1010 Alcohol abuse, uncomplicated: Secondary | ICD-10-CM

## 2016-06-01 DIAGNOSIS — F1011 Alcohol abuse, in remission: Secondary | ICD-10-CM | POA: Insufficient documentation

## 2016-06-01 DIAGNOSIS — F32A Depression, unspecified: Secondary | ICD-10-CM

## 2016-06-01 MED ORDER — CITALOPRAM HYDROBROMIDE 20 MG PO TABS: ORAL_TABLET | ORAL | Status: DC

## 2016-06-01 NOTE — Progress Notes (Signed)
   HPI  Patient presents today here to discuss mood, history of GI cancer, hyperlipidemia, and ethanol abuse.  Mood and ethanol abuse Patient's drinking 6-8 drinks at night. He states that he struggled with depression for years, about 3 years ago his son, who is 44 years old, died of pneumonia. Around the same time his wife left him for his brother, and his other brother died. He is also very anxious that his colon cancer will return. He denies suicidal thoughts. He does have anxiety He has difficulty sleeping when he does not drink.  Hyperlipidemia Watching his diet more carefully, not taking Lipitor.  Smoking Contemplative about quitting, not ready.  Acne, rosacea Facial rash for several months, he attributes this to alcohol and copper dust that is present at work.  PMH: Smoking status noted ROS: Per HPI  Objective: BP 145/82 mmHg  Pulse 76  Temp(Src) 97.7 F (36.5 C) (Oral)  Ht 5\' 9"  (1.753 m)  Wt 205 lb (92.987 kg)  BMI 30.26 kg/m2 Gen: NAD, alert, cooperative with exam HEENT: NCAT CV: RRR, good S1/S2, no murmur Resp: CTABL, no wheezes, non-labored Ext: No edema, warm Neuro: Alert and oriented, No gross deficits  Assessment and plan:  # Depression, mood disorder Starting citalopram Follow-up 3-4 weeks Discussed slowly titrating off of alcohol  # Ethanol abuse Encouraged him to cut back to 6 or less drinks a day Discussed slow titration He does not one to do inpatient rehabilitation or medical detox currently  # Tobacco abuse Contemplative  # History of colon cancer He is overdue for his every 2 year colonoscopy Referral written, he does not have a steady GI doctor, [facility]    Orders Placed This Encounter  Procedures  . Ambulatory referral to Gastroenterology    Referral Priority:  Routine    Referral Type:  Consultation    Referral Reason:  Specialty Services Required    Number of Visits Requested:  1    Meds  ordered this encounter  Medications  . citalopram (CELEXA) 20 MG tablet    Sig: 1/2 pill for 2 weeks then 1 pill daily    Dispense:  30 tablet    Refill:  [place], MD [facility] 06/01/2016, 10:59 AM

## 2016-06-01 NOTE — Telephone Encounter (Signed)
Advised patient to take celexa in morning.

## 2016-06-01 NOTE — Telephone Encounter (Signed)
Left detailed message on VM per ROI. Citilapram is to be taken 1/2 tablet for 2 weeks then 1 whole tablet

## 2016-06-01 NOTE — Telephone Encounter (Signed)
Take 1/2 celexa pill for 14 days then 1 pill daily.

## 2016-06-01 NOTE — Patient Instructions (Signed)
Great to meet you!  Look into getting a counselor, check out [email or web address] who has a very complete listing.    I have sent citalopram, an antidepressant, to [facility] with 1/2 pil for 2 weeks then increase to 1 pill daily.   Come back in 3-4 weeks

## 2016-06-01 NOTE — Telephone Encounter (Signed)
Advised pt to take in the evening since he works 7a - 7p and he did not want it to make him sleepy at work. To try that for a week then see if he needed to switch it to the morning

## 2016-06-16 ENCOUNTER — Encounter: Payer: Self-pay | Admitting: Gastroenterology

## 2016-07-02 ENCOUNTER — Encounter: Payer: Self-pay | Admitting: Family Medicine

## 2016-07-02 ENCOUNTER — Ambulatory Visit (INDEPENDENT_AMBULATORY_CARE_PROVIDER_SITE_OTHER): Payer: BLUE CROSS/BLUE SHIELD | Admitting: Family Medicine

## 2016-07-02 VITALS — BP 133/80 | HR 70 | Temp 98.0°F | Ht 69.0 in | Wt 202.8 lb

## 2016-07-02 DIAGNOSIS — Z720 Tobacco use: Secondary | ICD-10-CM

## 2016-07-02 DIAGNOSIS — F1010 Alcohol abuse, uncomplicated: Secondary | ICD-10-CM | POA: Diagnosis not present

## 2016-07-02 DIAGNOSIS — F32A Depression, unspecified: Secondary | ICD-10-CM

## 2016-07-02 DIAGNOSIS — F329 Major depressive disorder, single episode, unspecified: Secondary | ICD-10-CM

## 2016-07-02 MED ORDER — CITALOPRAM HYDROBROMIDE 20 MG PO TABS: 20.0000 mg | ORAL_TABLET | Freq: Every day | ORAL | Status: DC

## 2016-07-02 NOTE — Patient Instructions (Signed)
Great to see you!  I am so glad you are having good results!  Try to cut down to half the amount of alcohol for 2 weeks then quit.   Continue celexa, Come back in 2-3 months

## 2016-07-02 NOTE — Progress Notes (Signed)
   HPI  Patient presents today for depression and alcohol abuse.  Patient was previously drinking a sixpack of beer +one 40 ounce beer every night. Since his last visit he has cut down to 2 beers a night. He states that he feels much better as it pertains to depression and denies any suicidal thoughts. He states that since her last visit he "feels like the weight was lifted off of him". The medication is not bothering him, he denies any upset stomach. He had one coughing spell of a nosebleed which she was wondering if the medication could've caused.  He's planning to get his colonoscopy in a few weeks.  PMH: Smoking status noted ROS: Per HPI  Objective: BP 133/80 mmHg  Pulse 70  Temp(Src) 98 F (36.7 C) (Oral)  Ht 5\' 9"  (1.753 m)  Wt 202 lb 12.8 oz (91.989 kg)  BMI 29.93 kg/m2 Gen: NAD, alert, cooperative with exam HEENT: NCAT CV: RRR, good S1/S2, no murmur Resp: CTABL, no wheezes, non-labored Ext: No edema, warm Neuro: Alert and oriented, No gross deficits  Psych: Appropriate mood and affect, denies suicidal ideation  Assessment and plan:  # Depression Improving, likely more improvement from decreasing his alcohol use than starting the medication, however he does feel medication is helping as well. Continue Celexa at current dose Follow-up 2-3 months.  # Alcohol abuse Improving, we discussed ways to decrease further and obliquely stop. Consider this most likely to be mood disorder induced by substance use, likely alcoholic and would be best served by complete abstinence.  # Tobacco abuse Contemplative, not really ready to quit   Meds ordered this encounter  Medications  . citalopram (CELEXA) 20 MG tablet    Sig: Take 1 tablet (20 mg total) by mouth daily. 1/2 pill for 2 weeks then 1 pill daily    Dispense:  30 tablet    Refill:  [place], MD [facility] 07/02/2016, 10:33 AM

## 2016-07-21 ENCOUNTER — Ambulatory Visit (AMBULATORY_SURGERY_CENTER): Payer: BLUE CROSS/BLUE SHIELD | Admitting: *Deleted

## 2016-07-21 VITALS — Ht 70.0 in | Wt 202.0 lb

## 2016-07-21 DIAGNOSIS — Z85038 Personal history of other malignant neoplasm of large intestine: Secondary | ICD-10-CM

## 2016-07-21 MED ORDER — NA SULFATE-K SULFATE-MG SULF 17.5-3.13-1.6 GM/177ML PO SOLN: 1.0000 | Freq: Once | ORAL | 0 refills | Status: AC

## 2016-07-21 NOTE — Progress Notes (Signed)
No egg or soy allergy. No anesthesia problems.  No home O2.  No diet meds.  

## 2016-07-23 ENCOUNTER — Encounter: Payer: Self-pay | Admitting: Gastroenterology

## 2016-08-04 ENCOUNTER — Other Ambulatory Visit: Payer: BLUE CROSS/BLUE SHIELD

## 2016-08-04 ENCOUNTER — Encounter: Payer: Self-pay | Admitting: Gastroenterology

## 2016-08-04 ENCOUNTER — Ambulatory Visit (AMBULATORY_SURGERY_CENTER): Payer: BLUE CROSS/BLUE SHIELD | Admitting: Gastroenterology

## 2016-08-04 ENCOUNTER — Telehealth: Payer: Self-pay

## 2016-08-04 VITALS — BP 113/84 | HR 73 | Temp 98.9°F | Resp 15 | Ht 69.0 in | Wt 202.0 lb

## 2016-08-04 DIAGNOSIS — K6389 Other specified diseases of intestine: Secondary | ICD-10-CM

## 2016-08-04 DIAGNOSIS — D125 Benign neoplasm of sigmoid colon: Secondary | ICD-10-CM

## 2016-08-04 DIAGNOSIS — Z85038 Personal history of other malignant neoplasm of large intestine: Secondary | ICD-10-CM | POA: Diagnosis not present

## 2016-08-04 DIAGNOSIS — C189 Malignant neoplasm of colon, unspecified: Secondary | ICD-10-CM

## 2016-08-04 DIAGNOSIS — C184 Malignant neoplasm of transverse colon: Secondary | ICD-10-CM | POA: Diagnosis not present

## 2016-08-04 DIAGNOSIS — D123 Benign neoplasm of transverse colon: Secondary | ICD-10-CM

## 2016-08-04 DIAGNOSIS — D122 Benign neoplasm of ascending colon: Secondary | ICD-10-CM

## 2016-08-04 MED ORDER — SODIUM CHLORIDE 0.9 % IV SOLN: 500.0000 mL | INTRAVENOUS | Status: DC

## 2016-08-04 NOTE — Progress Notes (Signed)
Patient awakening,vss,report to rn 

## 2016-08-04 NOTE — Telephone Encounter (Signed)
Dr. [name]' office will arrange for CT       scan chest, abdomen, pelvis, CEA level.                           Referral to [facility] to consider subtotal  colectomy for colon cancer in setting of     (presumed) [name].   Will also work to get your records from [facility] genetic testing sent here for review.                            Dr. [name]' office will also arrange for EGD (for presumed [name] Screening) at [facility] in near future.   The lab order is in EPIC and the pt would like to call back with his schedule before the CT is set up, I will call [facility] and get appt and ROI is being signed for records.  EGD will also be set up when the pt calls with schedule for CT.

## 2016-08-04 NOTE — Patient Instructions (Signed)
YOU HAD AN ENDOSCOPIC PROCEDURE TODAY AT [facility]:   Refer to the procedure report that was given to you for any specific questions about what was found during the examination.  If the procedure report does not answer your questions, please call your gastroenterologist to clarify.  If you requested that your care partner not be given the details of your procedure findings, then the procedure report has been included in a sealed envelope for you to review at your convenience later.  YOU SHOULD EXPECT: Some feelings of bloating in the abdomen. Passage of more gas than usual.  Walking can help get rid of the air that was put into your GI tract during the procedure and reduce the bloating. If you had a lower endoscopy (such as a colonoscopy or flexible sigmoidoscopy) you may notice spotting of blood in your stool or on the toilet paper. If you underwent a bowel prep for your procedure, you may not have a normal bowel movement for a few days.  Please Note:  You might notice some irritation and congestion in your nose or some drainage.  This is from the oxygen used during your procedure.  There is no need for concern and it should clear up in a day or so.  SYMPTOMS TO REPORT IMMEDIATELY:   Following lower endoscopy (colonoscopy or flexible sigmoidoscopy):  Excessive amounts of blood in the stool  Significant tenderness or worsening of abdominal pains  Swelling of the abdomen that is new, acute  Fever of 100F or higher    For urgent or emergent issues, a gastroenterologist can be reached at any hour by calling [phone].   DIET:  We do recommend a small meal at first, but then you may proceed to your regular diet.  Drink plenty of fluids but you should avoid alcoholic beverages for 24 hours.  ACTIVITY:  You should plan to take it easy for the rest of today and you should NOT DRIVE or use heavy machinery until tomorrow (because of the sedation medicines used during the test).     FOLLOW UP: Our staff will call the number listed on your records the next business day following your procedure to check on you and address any questions or concerns that you may have regarding the information given to you following your procedure. If we do not reach you, we will leave a message.  However, if you are feeling well and you are not experiencing any problems, there is no need to return our call.  We will assume that you have returned to your regular daily activities without incident.  If any biopsies were taken you will be contacted by phone or by letter within the next 1-3 weeks.  Please call us at [phone] if you have not heard about the biopsies in 3 weeks.    SIGNATURES/CONFIDENTIALITY: You and/or your care partner have signed paperwork which will be entered into your electronic medical record.  These signatures attest to the fact that that the information above on your After Visit Summary has been reviewed and is understood.  Full responsibility of the confidentiality of this discharge information lies with you and/or your care-partner.   Office notified per Dr. [name] request for procedures to be scheduled. Contrast given to patient prior to discharge.Resume medications. Information given on polyps.

## 2016-08-04 NOTE — Progress Notes (Signed)
Patient stated that he does not have work schedule,he works 2/3 days meaning he works two days on two days [employer] and she instructed me to tell pt. To call when he gets home with his work schedule so she can schedule CT scan and other appointments. Pt. Verbalized understanding and phone number along with name written down and given to pt.

## 2016-08-04 NOTE — Progress Notes (Signed)
Pt. Signed release of information form prior to discharge.

## 2016-08-04 NOTE — Progress Notes (Signed)
Called to room for pathology. 

## 2016-08-04 NOTE — Op Note (Signed)
[place] Patient Name: Charles Santos Procedure Date: 08/04/2016 10:26 AM MRN: [id number] Endoscopist: [name] , MD Age: 45 Referring MD:  Date of Birth: 03-04-1971 Gender: Male Account #: [id number] Procedure:                Colonoscopy Indications:              Screening in patient at increased risk: Family                            history of 1st-degree relative with colorectal                            cancer; personal history of colon cancer at age 66;                            Colonoscopy Dr. [facility] 2002. Colonoscopy Dr.                            [name] 2011; no precancerous polyps; evaluated at                            [facility] genetics (we do not have those records here)                            and he was told he should have repeat colonoscopy                            every 2 years. Medicines:                Monitored Anesthesia Care Procedure:                Pre-Anesthesia Assessment:                           - Prior to the procedure, a History and Physical                            was performed, and patient medications and                            allergies were reviewed. The patient's tolerance of                            previous anesthesia was also reviewed. The risks                            and benefits of the procedure and the sedation                            options and risks were discussed with the patient.                            All questions were answered, and informed consent  was obtained. Prior Anticoagulants: The patient has                            taken no previous anticoagulant or antiplatelet                            agents. ASA Grade Assessment: II - A patient with                            mild systemic disease. After reviewing the risks                            and benefits, the patient was deemed in                            satisfactory condition to undergo the procedure.                        After obtaining informed consent, the colonoscope                            was passed under direct vision. Throughout the                            procedure, the patient's blood pressure, pulse, and                            oxygen saturations were monitored continuously. The                            Model CF-HQ190L [id number]) scope was introduced                            through the anus and advanced to the the                            ileocolonic anastomosis. The colonoscopy was                            performed without difficulty. The patient tolerated                            the procedure well. The quality of the bowel                            preparation was excellent. The rectum was                            photographed. Scope In: 10:32:05 AM Scope Out: 10:44:06 AM Scope Withdrawal Time: 0 hours 9 minutes 41 seconds  Total Procedure Duration: 0 hours 12 minutes 1 second  Findings:                 There was evidence of a prior end-to-side  colo-colonic anastomosis in the proximal transverse                            colon. This was patent and was characterized by                            healthy appearing mucosa.                           An ulcerated non-obstructing 3cm diameter mass was                            found in the mid transverse colon. The mass was                            non-circumferential. No bleeding was present. Area                            was tattooed with an injection of [place] ink.                            Biopsies were taken with a cold forceps for                            histology.                           Two semi-pedunculated polyps were found in the                            sigmoid colon. The polyps were 10 mm in size. These                            polyps were removed with a hot snare. Resection and                            retrieval were complete.                            The exam was otherwise without abnormality on                            direct and retroflexion views. Complications:            No immediate complications. Estimated blood loss:                            None. Estimated Blood Loss:     Estimated blood loss: none. Impression:               - Patent end-to-side colo-colonic anastomosis,                            characterized by healthy appearing mucosa.                           -  Likely malignant tumor in the mid transverse                            colon. Biopsied. Tattooed.                           - Two 10 mm polyps in the sigmoid colon, removed                            with a hot snare. Resected and retrieved.                           - The examination was otherwise normal on direct                            and retroflexion views. Recommendation:           - Patient has a contact number available for                            emergencies. The signs and symptoms of potential                            delayed complications were discussed with the                            patient. Return to normal activities tomorrow.                            Written discharge instructions were provided to the                            patient.                           - Resume previous diet.                           - Continue present medications.                           - Await final pathology.                           - Dr. [name]' office will arrange for CT scan                            chest, abdomen, pelvis, CEA level. Also for                            referral to [facility] to consider subtotal                            colectomy for colon cancer in setting of (presumed)  [name]. Will also work to get your records                            from [facility] genetic testing sent here for review.                           - Dr. [name]' office will also arrange for EGD (for                             presumed [name] Screening) at [facility] in near                            future. [name], MD 08/04/2016 10:59:47 AM This report has been signed electronically.

## 2016-08-05 ENCOUNTER — Telehealth: Payer: Self-pay

## 2016-08-05 ENCOUNTER — Telehealth: Payer: Self-pay | Admitting: Family Medicine

## 2016-08-05 ENCOUNTER — Telehealth: Payer: Self-pay | Admitting: Gastroenterology

## 2016-08-05 DIAGNOSIS — C189 Malignant neoplasm of colon, unspecified: Secondary | ICD-10-CM

## 2016-08-05 DIAGNOSIS — Z1509 Genetic susceptibility to other malignant neoplasm: Secondary | ICD-10-CM

## 2016-08-05 LAB — CEA: CEA: 6.9 ng/mL — ABNORMAL HIGH

## 2016-08-05 NOTE — Telephone Encounter (Signed)
  Follow up Call-  Call back number 08/04/2016  Post procedure Call Back phone  # [phone]  Permission to leave phone message Yes  Some recent data might be hidden    Patient was called for follow up after his procedure on 08/04/2016. No answer at the number given for follow up phone call. A message was left on the answering machine.

## 2016-08-05 NOTE — Telephone Encounter (Signed)
See alternate note  

## 2016-08-05 NOTE — Telephone Encounter (Signed)
I spoke with the pt and he is not ready to set up the CT, EGD or CCS appt's.  He gave a few dates that would work for appt's but will have to call tomorrow to set up beyond those days.  I have gone ahead and set up CCS with Dr Dalbert Batman for 08/10/16 at 11:15 am to arrive at 10:30 am, her EGD is scheduled for 08/31/16 10 am Tuesday, release has been faxed to [facility] for genetic records.  CT scan scheduled (see below)  You have been scheduled for a CT scan of the abdomen and pelvis at [place] ([address]---this is in the same building as Press photographer).   You are scheduled on 08/10/16 at 2 pm. You should arrive 15 minutes prior to your appointment time for registration. Please follow the written instructions below on the day of your exam:  WARNING: IF YOU ARE ALLERGIC TO IODINE/X-RAY DYE, PLEASE NOTIFY RADIOLOGY IMMEDIATELY AT [phone]! YOU WILL BE GIVEN A 13 HOUR PREMEDICATION PREP.  1) Do not eat or drink anything after 10 am (4 hours prior to your test) 2) You have been given 2 bottles of oral contrast to drink. The solution may taste better if refrigerated, but do NOT add ice or any other liquid to this solution. Shake well before drinking.    Drink 1 bottle of contrast @ 12 noon (2 hours prior to your exam)  Drink 1 bottle of contrast @ 1 pm (1 hour prior to your exam)  You may take any medications as prescribed with a small amount of water except for the following: Metformin, Glucophage, Glucovance, Avandamet, Riomet, Fortamet, Actoplus Met, Janumet, Glumetza or Metaglip. The above medications must be held the day of the exam AND 48 hours after the exam.  The purpose of you drinking the oral contrast is to aid in the visualization of your intestinal tract. The contrast solution may cause some diarrhea. Before your exam is started, you will be given a small amount of fluid to drink. Depending on your individual set of symptoms, you may also receive an intravenous injection of  x-ray contrast/dye. Plan on being at [facility] for 30 minutes or longer, depending on the type of exam you are having performed.  This test typically takes 30-45 minutes to complete.  If you have any questions regarding your exam or if you need to reschedule, you may call the CT department at [phone] between the hours of 8:00 am and 5:00 pm, Monday-Friday.  ________________________________________________________________________

## 2016-08-06 NOTE — Telephone Encounter (Signed)
Left message that Dr. Wendi Snipes is aware of results and call him if you need anything.  Please follow closely with your GI doctor.

## 2016-08-06 NOTE — Telephone Encounter (Signed)
Spoke with pt and he just wanted you to be aware of his Colonoscopy findings. FYI.

## 2016-08-06 NOTE — Telephone Encounter (Signed)
All information given to the pt and will be put at the front desk for pick up.  Pt verbalized understanding of the instructions.

## 2016-08-06 NOTE — Telephone Encounter (Signed)
The pt returned call and needs to reschedule the EGD to 09/07/16 10 am.  All other appts are ok, he already has the contrast and instruction sheet for the CT and will fill in the dates.  I will mail all the instructions to his home.

## 2016-08-06 NOTE — Telephone Encounter (Signed)
Thanks for the FYI.   I got the results, I am sorry to hear about the colon mass, I am glad he is getting what he needs with GI.

## 2016-08-08 ENCOUNTER — Telehealth: Payer: Self-pay | Admitting: Internal Medicine

## 2016-08-08 ENCOUNTER — Observation Stay (HOSPITAL_COMMUNITY)
Admission: AD | Admit: 2016-08-08 | Discharge: 2016-08-09 | Disposition: A | Discharge status: Home / Self Care | Payer: BLUE CROSS/BLUE SHIELD | Source: Other Acute Inpatient Hospital | Attending: Internal Medicine | Admitting: Internal Medicine

## 2016-08-08 ENCOUNTER — Encounter (HOSPITAL_COMMUNITY): Payer: Self-pay | Admitting: Radiology

## 2016-08-08 ENCOUNTER — Observation Stay (HOSPITAL_COMMUNITY): Payer: BLUE CROSS/BLUE SHIELD

## 2016-08-08 DIAGNOSIS — F17210 Nicotine dependence, cigarettes, uncomplicated: Secondary | ICD-10-CM | POA: Insufficient documentation

## 2016-08-08 DIAGNOSIS — C184 Malignant neoplasm of transverse colon: Secondary | ICD-10-CM

## 2016-08-08 DIAGNOSIS — Z980 Intestinal bypass and anastomosis status: Secondary | ICD-10-CM | POA: Diagnosis not present

## 2016-08-08 DIAGNOSIS — K922 Gastrointestinal hemorrhage, unspecified: Secondary | ICD-10-CM | POA: Diagnosis present

## 2016-08-08 DIAGNOSIS — Z85038 Personal history of other malignant neoplasm of large intestine: Secondary | ICD-10-CM

## 2016-08-08 DIAGNOSIS — Z86010 Personal history of colonic polyps: Secondary | ICD-10-CM | POA: Insufficient documentation

## 2016-08-08 DIAGNOSIS — K921 Melena: Principal | ICD-10-CM

## 2016-08-08 DIAGNOSIS — J439 Emphysema, unspecified: Secondary | ICD-10-CM | POA: Insufficient documentation

## 2016-08-08 DIAGNOSIS — Z720 Tobacco use: Secondary | ICD-10-CM | POA: Diagnosis present

## 2016-08-08 DIAGNOSIS — Z800 Family history of malignant neoplasm of digestive organs: Secondary | ICD-10-CM | POA: Insufficient documentation

## 2016-08-08 DIAGNOSIS — Z9221 Personal history of antineoplastic chemotherapy: Secondary | ICD-10-CM | POA: Insufficient documentation

## 2016-08-08 DIAGNOSIS — Z9889 Other specified postprocedural states: Secondary | ICD-10-CM | POA: Diagnosis not present

## 2016-08-08 DIAGNOSIS — F1010 Alcohol abuse, uncomplicated: Secondary | ICD-10-CM

## 2016-08-08 DIAGNOSIS — Z9049 Acquired absence of other specified parts of digestive tract: Secondary | ICD-10-CM | POA: Diagnosis not present

## 2016-08-08 DIAGNOSIS — R1032 Left lower quadrant pain: Secondary | ICD-10-CM | POA: Diagnosis not present

## 2016-08-08 DIAGNOSIS — F1011 Alcohol abuse, in remission: Secondary | ICD-10-CM | POA: Diagnosis present

## 2016-08-08 LAB — CBC
HCT: 45.9 % (ref 39.0–52.0)
HEMATOCRIT: 45.2 % (ref 39.0–52.0)
Hemoglobin: 15.9 g/dL (ref 13.0–17.0)
Hemoglobin: 16.5 g/dL (ref 13.0–17.0)
MCH: 33.7 pg (ref 26.0–34.0)
MCH: 34.4 pg — AB (ref 26.0–34.0)
MCHC: 35.2 g/dL (ref 30.0–36.0)
MCHC: 35.9 g/dL (ref 30.0–36.0)
MCV: 95.8 fL (ref 78.0–100.0)
MCV: 95.8 fL (ref 78.0–100.0)
PLATELETS: 187 10*3/uL (ref 150–400)
Platelets: 184 10*3/uL (ref 150–400)
RBC: 4.72 MIL/uL (ref 4.22–5.81)
RBC: 4.79 MIL/uL (ref 4.22–5.81)
RDW: 13.1 % (ref 11.5–15.5)
RDW: 13.1 % (ref 11.5–15.5)
WBC: 5.5 10*3/uL (ref 4.0–10.5)
WBC: 7.5 10*3/uL (ref 4.0–10.5)

## 2016-08-08 MED ORDER — ADULT MULTIVITAMIN W/MINERALS CH
1.0000 | ORAL_TABLET | Freq: Every day | ORAL | Status: DC
  Administered 2016-08-08 – 2016-08-09 (×2): 1 via ORAL
  Filled 2016-08-08 (×2): qty 1

## 2016-08-08 MED ORDER — IOPAMIDOL (ISOVUE-300) INJECTION 61%
100.0000 mL | Freq: Once | INTRAVENOUS | Status: AC | PRN
  Administered 2016-08-08: 100 mL via INTRAVENOUS

## 2016-08-08 MED ORDER — ONDANSETRON HCL 4 MG PO TABS: 4.0000 mg | ORAL_TABLET | Freq: Four times a day (QID) | ORAL | Status: DC | PRN

## 2016-08-08 MED ORDER — LORAZEPAM 1 MG PO TABS
1.0000 mg | ORAL_TABLET | Freq: Four times a day (QID) | ORAL | Status: DC | PRN
  Administered 2016-08-08: 1 mg via ORAL
  Filled 2016-08-08: qty 1

## 2016-08-08 MED ORDER — CITALOPRAM HYDROBROMIDE 20 MG PO TABS: 20.0000 mg | ORAL_TABLET | Freq: Every day | ORAL | Status: DC

## 2016-08-08 MED ORDER — THIAMINE HCL 100 MG/ML IJ SOLN: 100.0000 mg | Freq: Every day | INTRAMUSCULAR | Status: DC

## 2016-08-08 MED ORDER — DIATRIZOATE MEGLUMINE & SODIUM 66-10 % PO SOLN
30.0000 mL | Freq: Once | ORAL | Status: DC
  Administered 2016-08-08: 30 mL via ORAL

## 2016-08-08 MED ORDER — SODIUM CHLORIDE 0.9% FLUSH
3.0000 mL | Freq: Two times a day (BID) | INTRAVENOUS | Status: DC
  Administered 2016-08-08 – 2016-08-09 (×3): 3 mL via INTRAVENOUS

## 2016-08-08 MED ORDER — ONDANSETRON HCL 4 MG/2ML IJ SOLN: 4.0000 mg | Freq: Four times a day (QID) | INTRAMUSCULAR | Status: DC | PRN

## 2016-08-08 MED ORDER — FOLIC ACID 1 MG PO TABS
1.0000 mg | ORAL_TABLET | Freq: Every day | ORAL | Status: DC
  Administered 2016-08-08 – 2016-08-09 (×2): 1 mg via ORAL
  Filled 2016-08-08 (×2): qty 1

## 2016-08-08 MED ORDER — ACETAMINOPHEN 325 MG PO TABS: 650.0000 mg | ORAL_TABLET | Freq: Three times a day (TID) | ORAL | Status: DC | PRN

## 2016-08-08 MED ORDER — LORAZEPAM 2 MG/ML IJ SOLN: 1.0000 mg | Freq: Four times a day (QID) | INTRAMUSCULAR | Status: DC | PRN

## 2016-08-08 MED ORDER — NICOTINE 14 MG/24HR TD PT24
14.0000 mg | MEDICATED_PATCH | Freq: Every day | TRANSDERMAL | Status: DC
  Administered 2016-08-08 – 2016-08-09 (×2): 14 mg via TRANSDERMAL
  Filled 2016-08-08 (×2): qty 1

## 2016-08-08 MED ORDER — VITAMIN B-1 100 MG PO TABS
100.0000 mg | ORAL_TABLET | Freq: Every day | ORAL | Status: DC
  Administered 2016-08-08 – 2016-08-09 (×2): 100 mg via ORAL
  Filled 2016-08-08 (×2): qty 1

## 2016-08-08 NOTE — H&P (Signed)
History and Physical    Charles Santos [id number] DOB: 01/31/1971 DOA: 08/08/2016  PCP: Kenn File, MD  Outpatient Specialists: [facility] Patient coming from: [facility], home  Chief Complaint: blood in stool  HPI: Charles Santos is a 45 y.o. male with medical history significant of colon cancer when he was in his 16s, status post resection and chemotherapy, who had a colonoscopy 2 days ago by Dr. [name] which showed recurrence of his cancer as well as couple of polyps that were removed. He presents to the emergency room at [facility] with complaints of bright red blood per rectum. Patient tells me that the night prior to admission, he went to use the bathroom and saw "only blood". He had another episode, the next morning which prompted him to seek care. He has no chest pain or shortness of breath. He has no fever or chills. He complains of a left lower side abdominal pain,has no nausea or vomiting. He states that he drinks about 6 beers per day plus a 40 ounce, and he is a current smoker. He was transferred to the [facility] hospital for further evaluation.   ED Course: In the ED, his renal function is normal, his hemoglobin is 16, his LFTs are normal and his INR 0.9.  Review of Systems: As per HPI otherwise 10 point review of systems negative.   Past Medical History:  Diagnosis Date  . Anxiety   . Colon cancer ([place]) 1998  . Emphysema of lung ([place])   . Family hx of colon cancer   . Hyperlipidemia     Past Surgical History:  Procedure Laterality Date  . APPENDECTOMY    . colon resectomy  1998   2 degree herida  . COLONOSCOPY    . INGUINAL HERNIA REPAIR       reports that he has been smoking Cigarettes.  He has a 28.75 pack-year smoking history. He has never used smokeless tobacco. He reports that he drinks about 33.6 oz of alcohol per week . He reports that he does not use drugs.  No Known Allergies  Family History  Problem Relation Age of Onset  . Colon cancer Father       Prior to Admission medications   Medication Sig Start Date End Date Taking? Authorizing Provider  acetaminophen (TYLENOL) 650 MG CR tablet Take 650 mg by mouth every 8 (eight) hours as needed for pain.    Historical Provider, MD  citalopram (CELEXA) 20 MG tablet Take 1 tablet (20 mg total) by mouth daily. 1/2 pill for 2 weeks then 1 pill daily 07/02/16   Timmothy Euler, MD    Physical Exam: Vitals:   08/08/16 0656  BP: 126/74  Pulse: 70  Resp: 16  Temp: 98.9 F (37.2 C)  TempSrc: Oral  SpO2: 98%  Weight: 86.4 kg (190 lb 6.4 oz)  Height: _0  (1.778 m)    Constitutional: NAD, calm, comfortable Vitals:   08/08/16 0656  BP: 126/74  Pulse: 70  Resp: 16  Temp: 98.9 F (37.2 C)  TempSrc: Oral  SpO2: 98%  Weight: 86.4 kg (190 lb 6.4 oz)  Height: _1  (1.778 m)   Eyes: PERRL, lids and conjunctivae normal ENMT: Mucous membranes are moist. Posterior pharynx clear of any exudate or lesions.Normal dentition.  Neck: normal, supple Respiratory: clear to auscultation bilaterally, no wheezing, no crackles. Normal respiratory effort. No accessory muscle use.  Cardiovascular: Regular rate and rhythm, no murmurs / rubs / gallops. No extremity edema. 2+ pedal  pulses.  Abdomen: mild tenderness LLQ, no masses palpated. Bowel sounds positive. No guarding/rebound Musculoskeletal: no clubbing / cyanosis. Normal muscle tone.  Skin: no rashes, lesions, ulcers. No induration Neurologic: non focal  Psychiatric: Normal judgment and insight. Alert and oriented x 3. Normal mood.   Labs on Admission: I have personally reviewed following labs and imaging studies  [facility] hospital labs Na 139, K 3.3, Bicarb 19.5, [place] 21 (H), BUN 9, Cr 0.81, Ca 9.1, T bili 0.6, AST 30, ALT 28, Alk phos 80, INR 0.9, WBC 7.8, Hb 16.5, Plt 192.  CBC:  Recent Labs Lab 08/08/16 0804  WBC 5.5  HGB 15.9  HCT 45.2  MCV 95.8  PLT 844   Basic Metabolic Panel: No results for input(s): NA, K, CL, CO2,  GLUCOSE, BUN, CREATININE, CALCIUM, MG, PHOS in the last 168 hours. GFR: CrCl cannot be calculated (Patient's most recent lab result is older than the maximum 21 days allowed.). Liver Function Tests: No results for input(s): AST, ALT, ALKPHOS, BILITOT, PROT, ALBUMIN in the last 168 hours. No results for input(s): LIPASE, AMYLASE in the last 168 hours. No results for input(s): AMMONIA in the last 168 hours. Coagulation Profile: No results for input(s): INR, PROTIME in the last 168 hours. Cardiac Enzymes: No results for input(s): CKTOTAL, CKMB, CKMBINDEX, TROPONINI in the last 168 hours. BNP (last 3 results) No results for input(s): PROBNP in the last 8760 hours. HbA1C: No results for input(s): HGBA1C in the last 72 hours. CBG: No results for input(s): GLUCAP in the last 168 hours. Lipid Profile: No results for input(s): CHOL, HDL, LDLCALC, TRIG, CHOLHDL, LDLDIRECT in the last 72 hours. Thyroid Function Tests: No results for input(s): TSH, T4TOTAL, FREET4, T3FREE, THYROIDAB in the last 72 hours. Anemia Panel: No results for input(s): VITAMINB12, FOLATE, FERRITIN, TIBC, IRON, RETICCTPCT in the last 72 hours. Urine analysis: No results found for: COLORURINE, APPEARANCEUR, LABSPEC, PHURINE, GLUCOSEU, HGBUR, BILIRUBINUR, KETONESUR, PROTEINUR, UROBILINOGEN, NITRITE, LEUKOCYTESUR Sepsis Labs: _0 (procalcitonin:4,lacticidven:4) )No results found for this or any previous visit (from the past 240 hour(s)).   Radiological Exams on Admission: No results found.   Assessment/Plan Active Problems:   History of malignant neoplasm of large intestine   ETOH abuse   Tobacco abuse   GI bleed   GI bleed - Possible post polypectomy bleeding, consulted gastroenterology, they will see patient, appreciate input - Repeat CBC with hemoglobin of 15, continue to monitor  Recurrent colon cancer - Strong family history and with presumed [name], we'll need subtotal colectomy eventually -  Pathology is pending  - complete staging with CT chest, abdomen, pelvis  Tobacco abuse - We'll provide patient with a nicotine patch  Alcohol abuse - Place patient on CIWA    DVT prophylaxis: SCD  Code Status: Full  Family Communication: sons bedside Disposition Plan: home when ready Consults called: GI  Admission status: Obs    Marzetta Board, MD [facility] Pager [phone]  If 7PM-7AM, please contact night-coverage [email or web address] Password [facility]  08/08/2016, 9:35 AM

## 2016-08-08 NOTE — Consult Note (Signed)
Referring Provider: Dr. Cruzita Lederer Primary Care Physician:  Kenn File, MD Primary Gastroenterologist:  Dr. [name]   Reason for Consultation:  GI bleed  HPI: Charles Santos is a 45 y.o. male with history of colon cancer at age 34 s/p resection and chemo.  Had colonoscopy on 8/23 by Dr. [name] that showed the following:  - Patent end-to-side colo-colonic anastomosis, characterized by healthy appearing mucosa. - Likely malignant tumor in the mid transverse colon. Biopsied. Tattooed. - Two 10 mm polyps in the sigmoid colon, removed with a hot snare. Resected and retrieved  He presented to [facility] with complaints of red blood per rectum. Patient tells me that the night prior to admission, he went to use the bathroom and saw "only blood". He had another episode, the next morning which prompted him to seek care.  He also complained of some mild left sided abdominal pain but that has now resolved.  Was transferred to [facility] hospital.  ED Course: In the ED, his renal function is normal, his hemoglobin is 16, his LFTs are normal and his INR 0.9.  He tells me that he's not experienced any further bleeding since last night.  Is currently drinking CT scan contrast.  Past Medical History:  Diagnosis Date  . Anxiety   . Colon cancer ([place]) 1998  . Emphysema of lung ([place])   . Family hx of colon cancer   . Hyperlipidemia     Past Surgical History:  Procedure Laterality Date  . APPENDECTOMY    . colon resectomy  1998   2 degree herida  . COLONOSCOPY    . INGUINAL HERNIA REPAIR      Prior to Admission medications   Medication Sig Start Date End Date Taking? Authorizing Provider  acetaminophen (TYLENOL) 650 MG CR tablet Take 650 mg by mouth every 8 (eight) hours as needed for pain.    Historical Provider, MD  citalopram (CELEXA) 20 MG tablet Take 1 tablet (20 mg total) by mouth daily. 1/2 pill for 2 weeks then 1 pill daily 07/02/16   Timmothy Euler, MD    Current  Facility-Administered Medications  Medication Dose Route Frequency Provider Last Rate Last Dose  . acetaminophen (TYLENOL) CR tablet 650 mg  650 mg Oral Q8H PRN Costin Karlyne Greenspan, MD      . Derrill Memo ON 08/09/2016] citalopram (CELEXA) tablet 20 mg  20 mg Oral Daily Costin Karlyne Greenspan, MD      . diatrizoate meglumine-sodium (GASTROGRAFIN) 66-10 % solution 30 mL  30 mL Oral Once Costin Karlyne Greenspan, MD      . folic acid (FOLVITE) tablet 1 mg  1 mg Oral Daily Costin Karlyne Greenspan, MD      . LORazepam (ATIVAN) tablet 1 mg  1 mg Oral Q6H PRN Costin Karlyne Greenspan, MD       Or  . LORazepam (ATIVAN) injection 1 mg  1 mg Intravenous Q6H PRN Costin Karlyne Greenspan, MD      . multivitamin with minerals tablet 1 tablet  1 tablet Oral Daily Costin Karlyne Greenspan, MD      . nicotine (NICODERM CQ - dosed in mg/24 hours) patch 14 mg  14 mg Transdermal Daily Costin Karlyne Greenspan, MD      . ondansetron (ZOFRAN) tablet 4 mg  4 mg Oral Q6H PRN Costin Karlyne Greenspan, MD       Or  . ondansetron (ZOFRAN) injection 4 mg  4 mg Intravenous Q6H PRN Costin Karlyne Greenspan, MD      .  sodium chloride flush (NS) 0.9 % injection 3 mL  3 mL Intravenous Q12H Costin Karlyne Greenspan, MD      . thiamine (VITAMIN B-1) tablet 100 mg  100 mg Oral Daily Costin Karlyne Greenspan, MD       Or  . thiamine (B-1) injection 100 mg  100 mg Intravenous Daily Costin Karlyne Greenspan, MD        Allergies as of 08/08/2016  . (No Known Allergies)    Family History  Problem Relation Age of Onset  . Colon cancer Father     Social History   Social History  . Marital status: Legally Separated    Spouse name: N/A  . Number of children: N/A  . Years of education: N/A   Occupational History  . Not on file.   Social History Main Topics  . Smoking status: Current Every Day Smoker    Packs/day: 1.25    Years: 23.00    Types: Cigarettes  . Smokeless tobacco: Never Used  . Alcohol use 33.6 oz/week    56 Standard drinks or equivalent per week     Comment: occ  . Drug use: No  . Sexual activity:  Not on file   Other Topics Concern  . Not on file   Social History Narrative  . No narrative on file    Review of Systems: Ten point ROS is O/W negative except as mentioned in HPI.  Physical Exam: Vital signs in last 24 hours: Temp:  [98.9 F (37.2 C)] 98.9 F (37.2 C) (08/27 0656) Pulse Rate:  [70] 70 (08/27 0656) Resp:  [16] 16 (08/27 0656) BP: (126)/(74) 126/74 (08/27 0656) SpO2:  [98 %] 98 % (08/27 0656) Weight:  [190 lb 6.4 oz (86.4 kg)] 190 lb 6.4 oz (86.4 kg) (08/27 0656)   General:  Alert, Well-developed, well-nourished, pleasant and cooperative in NAD Head:  Normocephalic and atraumatic. Eyes:  Sclera clear, no icterus.  Conjunctiva pink. Ears:  Normal auditory acuity. Mouth:  No deformity or lesions.   Lungs:  Clear throughout to auscultation.  No wheezes, crackles, or rhonchi. Heart:  Regular rate and rhythm; no murmurs, clicks, rubs, or gallops. Abdomen:  Soft, non-distended.  BS present.  Non-tender. Rectal:  Deferred  Msk:  Symmetrical without gross deformities. Pulses:  Normal pulses noted. Extremities:  Without clubbing or edema. Neurologic:  Alert and oriented x 4;  grossly normal neurologically. Skin:  Intact without significant lesions or rashes. Psych:  Alert and cooperative. Normal mood and affect.  Lab Results:  Recent Labs  08/08/16 0804  WBC 5.5  HGB 15.9  HCT 45.2  PLT 184   IMPRESSION:  -45 year old male with history of colon cancer at age 58 and now recurrent mass on recent colonoscopy along with two other polyps that were removed who is presenting with rectal bleeding.  Polyps were removed with hot cautery so question if post-polypectomy bleed or if the ulcerated mass bled some.  No further bleeding since last night.  Hgb is stable.  Drinking contrast for CT scan currently.  PLAN: -Await results of CT scan. -Monitor for further bleeding and monitor Hgb.  ZEHR, JESSICA D.  08/08/2016, 8:46 AM  Pager number [phone]     Attending  physician's note   I have taken a history, examined the patient and reviewed the chart. I agree with the Advanced Practitioner's note, impression and recommendations.  LGI bleed following colonoscopy with snare cautery of 2 sigmoid colon polyps and biopsy of malignant appearing  transverse colon mass. Postpolypectomy bleed vs tumor bleed. He had recurrent bleeding this morning after Alonza Bogus evaluated him. CT was already ordered for tumor staging and he has had the oral contrast. Await CT results. If bleeding persist will proceed with colonoscopy. Trend Hb.   Lucio Edward, MD [facility] [phone] Mon-Fri 8a-5p [phone] after 5p, weekends, holidays

## 2016-08-08 NOTE — Progress Notes (Signed)
Pt had a BM that was entirely bloody. It was dark and bright red in color and a few clots were noted. It was a small amount. Pt reports that the stool he had at home yesterday was more watery and more bright red in color, compared to this stool. Will continue to monitor BMs.

## 2016-08-08 NOTE — Telephone Encounter (Signed)
Called by [facility] hospital: 45 yo M with h/o colon cancer at age 60, treated with resection and chemo.  Colonoscopy a couple of days ago found ulcerated mass and had 2x polyps removed also.  Had small amount rectal bleeding onset tonight.  HGB 16.  Pending CT chest abd pelvis, blood work, and subtotal colectomy.  Accepted to tele for GI bleed.

## 2016-08-08 NOTE — Progress Notes (Signed)
Patient had another BM. This one was a medium amount of bright red watery stool with dark red clots.

## 2016-08-08 NOTE — Progress Notes (Signed)
Have notified [facility] that pt has arrived and is in the system and does not have any orders as of yet.

## 2016-08-08 NOTE — Progress Notes (Signed)
Patient has had 3rd BM for today. This BM was a small amount of loose stool that was brown with some bright red streaks. The patient reported bright red blood when wiping.

## 2016-08-09 ENCOUNTER — Other Ambulatory Visit: Payer: Self-pay

## 2016-08-09 ENCOUNTER — Telehealth: Payer: Self-pay

## 2016-08-09 DIAGNOSIS — K921 Melena: Secondary | ICD-10-CM

## 2016-08-09 DIAGNOSIS — Z85038 Personal history of other malignant neoplasm of large intestine: Secondary | ICD-10-CM | POA: Diagnosis not present

## 2016-08-09 DIAGNOSIS — F1010 Alcohol abuse, uncomplicated: Secondary | ICD-10-CM | POA: Diagnosis not present

## 2016-08-09 DIAGNOSIS — Z9889 Other specified postprocedural states: Secondary | ICD-10-CM

## 2016-08-09 DIAGNOSIS — K922 Gastrointestinal hemorrhage, unspecified: Secondary | ICD-10-CM

## 2016-08-09 LAB — CBC
HCT: 48.6 % (ref 39.0–52.0)
Hemoglobin: 16.6 g/dL (ref 13.0–17.0)
MCH: 33.5 pg (ref 26.0–34.0)
MCHC: 34.2 g/dL (ref 30.0–36.0)
MCV: 98 fL (ref 78.0–100.0)
PLATELETS: 192 10*3/uL (ref 150–400)
RBC: 4.96 MIL/uL (ref 4.22–5.81)
RDW: 13.1 % (ref 11.5–15.5)
WBC: 6.4 10*3/uL (ref 4.0–10.5)

## 2016-08-09 LAB — COMPREHENSIVE METABOLIC PANEL
ALBUMIN: 3.7 g/dL (ref 3.5–5.0)
ALK PHOS: 65 U/L (ref 38–126)
ALT: 25 U/L (ref 17–63)
ANION GAP: 6 (ref 5–15)
AST: 26 U/L (ref 15–41)
BUN: 8 mg/dL (ref 6–20)
CO2: 27 mmol/L (ref 22–32)
Calcium: 8.7 mg/dL — ABNORMAL LOW (ref 8.9–10.3)
Chloride: 105 mmol/L (ref 101–111)
Creatinine, Ser: 0.87 mg/dL (ref 0.61–1.24)
GFR calc Af Amer: >60 mL/min (ref >60)
GFR calc non Af Amer: >60 mL/min (ref >60)
GLUCOSE: 98 mg/dL (ref 65–99)
POTASSIUM: 3.6 mmol/L (ref 3.5–5.1)
SODIUM: 138 mmol/L (ref 135–145)
Total Bilirubin: 1.2 mg/dL (ref 0.3–1.2)
Total Protein: 6.6 g/dL (ref 6.5–8.1)

## 2016-08-09 NOTE — Progress Notes (Signed)
Progress Note   Subjective  Chief Complaint:GI bleed,history of colon cancer at age 45 with recurrent mass on recent colonoscopy  Patient found laying in bed today, he has devoured his clear liquid diet. He tells me he has no abdominal pain and his last bowel movementwas around 3 PM yesterday with only slight streaking of bright red blood, hemoglobin has remained stable, he would like to go home   Objective   Vital signs in last 24 hours: Temp:  [98 F (36.7 C)-98.4 F (36.9 C)] 98 F (36.7 C) (08/28 0539) Pulse Rate:  [58-68] 58 (08/28 0539) Resp:  [16] 16 (08/28 0539) BP: (121-131)/(72-84) 122/72 (08/28 0539) SpO2:  [95 %-98 %] 95 % (08/28 0539) Last BM Date: 08/08/16 General: Caucasian male in NAD Heart:  Regular rate and rhythm; no murmurs Lungs: Respirations even and unlabored, lungs CTA bilaterally Abdomen:  Soft, nontender and nondistended. Normal bowel sounds. Extremities:  Without edema. Neurologic:  Alert and oriented,  grossly normal neurologically. Psych:  Cooperative. Normal mood and affect.  Intake/Output from previous day: 08/27 0701 - 08/28 0700 In: 840 [P.O.:840] Out: 2100 [Urine:2100]  Lab Results:  Recent Labs  08/08/16 0804 08/08/16 1657 08/09/16 0445  WBC 5.5 7.5 6.4  HGB 15.9 16.5 16.6  HCT 45.2 45.9 48.6  PLT 184 187 192   BMET  Recent Labs  08/09/16 0445  NA 138  K 3.6  CL 105  CO2 27  GLUCOSE 98  BUN 8  CREATININE 0.87  CALCIUM 8.7*   LFT  Recent Labs  08/09/16 0445  PROT 6.6  ALBUMIN 3.7  AST 26  ALT 25  ALKPHOS 65  BILITOT 1.2   PT/INR No results for input(s): LABPROT, INR in the last 72 hours.  Studies/Results: Ct Chest W Contrast  Result Date: 08/08/2016 CLINICAL DATA:  Malignant neoplasm of the colon.  Staging. EXAM: CT CHEST WITH CONTRAST CT ABDOMEN AND PELVIS WITH CONTRAST TECHNIQUE: Multidetector CT imaging of the chest was performed during intravenous contrast administration. Multidetector CT imaging of  the abdomen and pelvis was performed following the standard protocol before and during bolus administration of intravenous contrast. CONTRAST:  173mL ISOVUE-300 IOPAMIDOL (ISOVUE-300) INJECTION 61% COMPARISON:  CT of the abdomen pelvis 03/31/2011 FINDINGS: CT CHEST FINDINGS Mediastinum/Lymph Nodes: No masses, pathologically enlarged lymph nodes, or other significant abnormality. Lungs/Pleura: No pulmonary mass, infiltrate, or effusion. Musculoskeletal: No chest wall mass or suspicious bone lesions identified. CT ABDOMEN PELVIS FINDINGS Hepatobiliary: No masses or other significant abnormality. Pancreas: No mass, inflammatory changes, or other significant abnormality. Spleen: Within normal limits in size and appearance. Adrenals/Urinary Tract: No masses identified. No evidence of hydronephrosis. Stomach/Bowel: No evidence of obstruction, inflammatory process, or abnormal fluid collections. Postsurgical changes from cecal resection. Vascular/Lymphatic: No pathologically enlarged lymph nodes. Scattered mesenteric lymph nodes in the right upper abdomen are unchanged, non pathologic by CT criteria. No evidence of abdominal aortic aneurysm. Reproductive: Scattered calcifications within the prostate gland, nonspecific. Other: None. Musculoskeletal: No suspicious bone lesions identified. Mild osteoarthritic changes of the lumbosacral spine with multiple Schmorl's nodes. Mild osteoarthritic changes of bilateral hips also seen. IMPRESSION: Normal appearance of the thorax. Normal appearance of the abdomen and pelvis, status post right cecal resection. No evidence of metastatic disease. Electronically Signed   By: Fidela Salisbury M.D.   On: 08/08/2016 13:33   Ct Abdomen Pelvis W Contrast  Result Date: 08/08/2016 CLINICAL DATA:  Malignant neoplasm of the colon.  Staging. EXAM: CT CHEST WITH CONTRAST CT ABDOMEN AND  PELVIS WITH CONTRAST TECHNIQUE: Multidetector CT imaging of the chest was performed during intravenous  contrast administration. Multidetector CT imaging of the abdomen and pelvis was performed following the standard protocol before and during bolus administration of intravenous contrast. CONTRAST:  114mL ISOVUE-300 IOPAMIDOL (ISOVUE-300) INJECTION 61% COMPARISON:  CT of the abdomen pelvis 03/31/2011 FINDINGS: CT CHEST FINDINGS Mediastinum/Lymph Nodes: No masses, pathologically enlarged lymph nodes, or other significant abnormality. Lungs/Pleura: No pulmonary mass, infiltrate, or effusion. Musculoskeletal: No chest wall mass or suspicious bone lesions identified. CT ABDOMEN PELVIS FINDINGS Hepatobiliary: No masses or other significant abnormality. Pancreas: No mass, inflammatory changes, or other significant abnormality. Spleen: Within normal limits in size and appearance. Adrenals/Urinary Tract: No masses identified. No evidence of hydronephrosis. Stomach/Bowel: No evidence of obstruction, inflammatory process, or abnormal fluid collections. Postsurgical changes from cecal resection. Vascular/Lymphatic: No pathologically enlarged lymph nodes. Scattered mesenteric lymph nodes in the right upper abdomen are unchanged, non pathologic by CT criteria. No evidence of abdominal aortic aneurysm. Reproductive: Scattered calcifications within the prostate gland, nonspecific. Other: None. Musculoskeletal: No suspicious bone lesions identified. Mild osteoarthritic changes of the lumbosacral spine with multiple Schmorl's nodes. Mild osteoarthritic changes of bilateral hips also seen. IMPRESSION: Normal appearance of the thorax. Normal appearance of the abdomen and pelvis, status post right cecal resection. No evidence of metastatic disease. Electronically Signed   By: Fidela Salisbury M.D.   On: 08/08/2016 13:33       Assessment / Plan:   Assessment: 1. GI bleed: though likely secondary to recent polyp removal versus possible tumor bleeding,  No further since 3 PM yesterday and has decreased in frequency, hemoglobin  stable and increasing 2. History of colon cancer at age 45 with recurrent mass on recent colonoscopy: CT as above with no signs of metastasis   Plan: 1. Patient does have a follow-up appointment scheduled with Dr. [name] for tomorrow 08/10/16 in the morning 2. Agree with increasing to a full liquid diet and then to low fiber/low residue 3. Agree with patient discharged today with plans for follow-up with surgery and to follow in our office tomorrow as scheduled 4. Discussed above with Dr. Silverio Decamp  Thank you for your kind consultation, we will sign off, please let us know if we may be of any further assistance    LOS: 1 day   Levin Erp  08/09/2016, 9:53 AM  Pager # [phone]

## 2016-08-09 NOTE — Telephone Encounter (Signed)
The pt was admitted over the weekend and had CT scan done.  He was called and asked to come in tomorrow morning for labs and reminded him of [facility] appt tomorrow with Dr Dalbert Batman at 10:30 am.  He also has an EGD for 09/07/16 at 10 am.  We discussed at length the appt information and the pt verbalized back to me the details.  He has asked to call back and set up follow up appt with Dr [name] so that he can coordinate with his schedule.

## 2016-08-09 NOTE — Care Management Note (Signed)
Case Management Note  Patient Details  Name: Charles Santos MRN: [id number] Date of Birth: Apr 07, 1971  Subjective/Objective:  45 y/o m admitted w/GIB. From home.                  Action/Plan:d/c home no needs or orders.   Expected Discharge Date:                  Expected Discharge Plan:  Home/Self Care  In-House Referral:     Discharge planning Services  CM Consult  Post Acute Care Choice:    Choice offered to:     DME Arranged:    DME Agency:     HH Arranged:    [place] Agency:     Status of Service:  Completed, signed off  If discussed at [employer] of Stay Meetings, dates discussed:    Additional Comments:  Dessa Phi, RN 08/09/2016, 11:23 AM

## 2016-08-09 NOTE — Discharge Summary (Signed)
Physician Discharge Summary  Sharbel Cioffi [id number] DOB: 1971/02/26 DOA: 08/08/2016  PCP: Kenn File, MD  Admit date: 08/08/2016 Discharge date: 08/09/2016  Admitted From: home Disposition:  home  Recommendations for Outpatient Follow-up:  Follow up with Dr. Dalbert Batman with General Surgery in 1 day Follow up with Dr. [name] as scheduled  Home Health: none Equipment/Devices: none  Discharge Condition: stable CODE STATUS: Full Diet recommendation: regular  HPI: HPI: Charles Santos is a 45 y.o. male with medical history significant of colon cancer when he was in his 7s, status post resection and chemotherapy, who had a colonoscopy 2 days ago by Dr. [name] which showed recurrence of his cancer as well as couple of polyps that were removed. He presents to the emergency room at [facility] with complaints of bright red blood per rectum. Patient tells me that the night prior to admission, he went to use the bathroom and saw "only blood". He had another episode, the next morning which prompted him to seek care. He has no chest pain or shortness of breath. He has no fever or chills. He complains of a left lower side abdominal pain,has no nausea or vomiting. He states that he drinks about 6 beers per day plus a 40 ounce, and he is a current smoker. He was transferred to the [facility] long hospital for further evaluation.   ED Course: In the ED, his renal function is normal, his hemoglobin is 16, his LFTs are normal and his INR 0.9. Hospital Course: Discharge Diagnoses:  Active Problems:   History of malignant neoplasm of colon without staging   ETOH abuse   Tobacco abuse   GI bleed   S/P colonoscopic polypectomy   Hematochezia  The patient was admitted to the hospital with GI bleed in the setting of recent colonoscopy. During the colonoscopy it was found that his colon cancer has returned, had 2 polyps that were removed. Gastroenterology was consulted and followed patient was hospitalized.  Patient's GI bleed is likely to be a post polypectomy bleed, it resolved, patient's hemoglobin has remained stable around 15-16, discussed with gastroenterology, will discharge patient home. He has close follow-up with Gen. surgery tomorrow as he will likely need to be evaluated for surgery. Patient was advised against tobacco and alcohol use, he was placed on CIWA while hospitalized but did not trigger but did not appear to be in withdrawals.  Discharge Instructions     Medication List    TAKE these medications   acetaminophen 650 MG CR tablet Commonly known as:  TYLENOL Take 650 mg by mouth every 8 (eight) hours as needed for pain.      Follow-up Information    [name], MD .   Specialty:  Gastroenterology Contact information: [address] [place] [address] [phone]          No Known Allergies  Consultations:  GI  Procedures/Studies:  none  Ct Chest W Contrast  Result Date: 08/08/2016 CLINICAL DATA:  Malignant neoplasm of the colon.  Staging. EXAM: CT CHEST WITH CONTRAST CT ABDOMEN AND PELVIS WITH CONTRAST TECHNIQUE: Multidetector CT imaging of the chest was performed during intravenous contrast administration. Multidetector CT imaging of the abdomen and pelvis was performed following the standard protocol before and during bolus administration of intravenous contrast. CONTRAST:  120mL ISOVUE-300 IOPAMIDOL (ISOVUE-300) INJECTION 61% COMPARISON:  CT of the abdomen pelvis 03/31/2011 FINDINGS: CT CHEST FINDINGS Mediastinum/Lymph Nodes: No masses, pathologically enlarged lymph nodes, or other significant abnormality. Lungs/Pleura: No pulmonary mass, infiltrate,  or effusion. Musculoskeletal: No chest wall mass or suspicious bone lesions identified. CT ABDOMEN PELVIS FINDINGS Hepatobiliary: No masses or other significant abnormality. Pancreas: No mass, inflammatory changes, or other significant abnormality. Spleen: Within normal limits in size and appearance.  Adrenals/Urinary Tract: No masses identified. No evidence of hydronephrosis. Stomach/Bowel: No evidence of obstruction, inflammatory process, or abnormal fluid collections. Postsurgical changes from cecal resection. Vascular/Lymphatic: No pathologically enlarged lymph nodes. Scattered mesenteric lymph nodes in the right upper abdomen are unchanged, non pathologic by CT criteria. No evidence of abdominal aortic aneurysm. Reproductive: Scattered calcifications within the prostate gland, nonspecific. Other: None. Musculoskeletal: No suspicious bone lesions identified. Mild osteoarthritic changes of the lumbosacral spine with multiple Schmorl's nodes. Mild osteoarthritic changes of bilateral hips also seen. IMPRESSION: Normal appearance of the thorax. Normal appearance of the abdomen and pelvis, status post right cecal resection. No evidence of metastatic disease. Electronically Signed   By: Fidela Salisbury M.D.   On: 08/08/2016 13:33   Ct Abdomen Pelvis W Contrast  Result Date: 08/08/2016 CLINICAL DATA:  Malignant neoplasm of the colon.  Staging. EXAM: CT CHEST WITH CONTRAST CT ABDOMEN AND PELVIS WITH CONTRAST TECHNIQUE: Multidetector CT imaging of the chest was performed during intravenous contrast administration. Multidetector CT imaging of the abdomen and pelvis was performed following the standard protocol before and during bolus administration of intravenous contrast. CONTRAST:  152mL ISOVUE-300 IOPAMIDOL (ISOVUE-300) INJECTION 61% COMPARISON:  CT of the abdomen pelvis 03/31/2011 FINDINGS: CT CHEST FINDINGS Mediastinum/Lymph Nodes: No masses, pathologically enlarged lymph nodes, or other significant abnormality. Lungs/Pleura: No pulmonary mass, infiltrate, or effusion. Musculoskeletal: No chest wall mass or suspicious bone lesions identified. CT ABDOMEN PELVIS FINDINGS Hepatobiliary: No masses or other significant abnormality. Pancreas: No mass, inflammatory changes, or other significant abnormality.  Spleen: Within normal limits in size and appearance. Adrenals/Urinary Tract: No masses identified. No evidence of hydronephrosis. Stomach/Bowel: No evidence of obstruction, inflammatory process, or abnormal fluid collections. Postsurgical changes from cecal resection. Vascular/Lymphatic: No pathologically enlarged lymph nodes. Scattered mesenteric lymph nodes in the right upper abdomen are unchanged, non pathologic by CT criteria. No evidence of abdominal aortic aneurysm. Reproductive: Scattered calcifications within the prostate gland, nonspecific. Other: None. Musculoskeletal: No suspicious bone lesions identified. Mild osteoarthritic changes of the lumbosacral spine with multiple Schmorl's nodes. Mild osteoarthritic changes of bilateral hips also seen. IMPRESSION: Normal appearance of the thorax. Normal appearance of the abdomen and pelvis, status post right cecal resection. No evidence of metastatic disease. Electronically Signed   By: Fidela Salisbury M.D.   On: 08/08/2016 13:33      Subjective: - no chest pain, shortness of breath, no abdominal pain, nausea or vomiting.    Discharge Exam: Vitals:   08/08/16 2057 08/09/16 0539  BP: 131/84 122/72  Pulse: 63 (!) 58  Resp: 16 16  Temp: 98.4 F (36.9 C) 98 F (36.7 C)   Vitals:   08/08/16 0656 08/08/16 1336 08/08/16 2057 08/09/16 0539  BP: 126/74 121/79 131/84 122/72  Pulse: 70 68 63 (!) 58  Resp: 16 16 16 16   Temp: 98.9 F (37.2 C) 98.4 F (36.9 C) 98.4 F (36.9 C) 98 F (36.7 C)  TempSrc: Oral Oral Oral Oral  SpO2: 98% 97% 98% 95%  Weight: 86.4 kg (190 lb 6.4 oz)     Height: 5\' 10"  (1.778 m)       General: Pt is alert, awake, not in acute distress Cardiovascular: RRR, S1/S2 +, no rubs, no gallops Respiratory: CTA bilaterally, no wheezing, no rhonchi Abdominal:  Soft, NT, ND, bowel sounds + Extremities: no edema, no cyanosis    The results of significant diagnostics from this hospitalization (including imaging,  microbiology, ancillary and laboratory) are listed below for reference.     Microbiology: No results found for this or any previous visit (from the past 240 hour(s)).   Labs: BNP (last 3 results) No results for input(s): BNP in the last 8760 hours. Basic Metabolic Panel:  Recent Labs Lab 08/09/16 0445  NA 138  K 3.6  CL 105  CO2 27  GLUCOSE 98  BUN 8  CREATININE 0.87  CALCIUM 8.7*   Liver Function Tests:  Recent Labs Lab 08/09/16 0445  AST 26  ALT 25  ALKPHOS 65  BILITOT 1.2  PROT 6.6  ALBUMIN 3.7   No results for input(s): LIPASE, AMYLASE in the last 168 hours. No results for input(s): AMMONIA in the last 168 hours. CBC:  Recent Labs Lab 08/08/16 0804 08/08/16 1657 08/09/16 0445  WBC 5.5 7.5 6.4  HGB 15.9 16.5 16.6  HCT 45.2 45.9 48.6  MCV 95.8 95.8 98.0  PLT 184 187 192   Cardiac Enzymes: No results for input(s): CKTOTAL, CKMB, CKMBINDEX, TROPONINI in the last 168 hours. BNP: Invalid input(s): POCBNP CBG: No results for input(s): GLUCAP in the last 168 hours. D-Dimer No results for input(s): DDIMER in the last 72 hours. Hgb A1c No results for input(s): HGBA1C in the last 72 hours. Lipid Profile No results for input(s): CHOL, HDL, LDLCALC, TRIG, CHOLHDL, LDLDIRECT in the last 72 hours. Thyroid function studies No results for input(s): TSH, T4TOTAL, T3FREE, THYROIDAB in the last 72 hours.  Invalid input(s): FREET3 Anemia work up No results for input(s): VITAMINB12, FOLATE, FERRITIN, TIBC, IRON, RETICCTPCT in the last 72 hours. Urinalysis No results found for: COLORURINE, APPEARANCEUR, LABSPEC, [place], GLUCOSEU, HGBUR, BILIRUBINUR, KETONESUR, PROTEINUR, UROBILINOGEN, NITRITE, LEUKOCYTESUR Sepsis Labs Invalid input(s): PROCALCITONIN,  WBC,  LACTICIDVEN Microbiology No results found for this or any previous visit (from the past 240 hour(s)).   Time coordinating discharge: Over 30 minutes  SIGNED:  Marzetta Board, MD  [facility] 08/09/2016, 4:23 PM Pager [phone]  If 7PM-7AM, please contact night-coverage [email or web address] Password [email or web address]

## 2016-08-10 ENCOUNTER — Inpatient Hospital Stay: Admission: RE | Admit: 2016-08-10 | Payer: BLUE CROSS/BLUE SHIELD | Source: Ambulatory Visit

## 2016-08-10 ENCOUNTER — Other Ambulatory Visit: Payer: BLUE CROSS/BLUE SHIELD

## 2016-08-10 ENCOUNTER — Other Ambulatory Visit (INDEPENDENT_AMBULATORY_CARE_PROVIDER_SITE_OTHER): Payer: BLUE CROSS/BLUE SHIELD

## 2016-08-10 ENCOUNTER — Other Ambulatory Visit: Payer: Self-pay | Admitting: General Surgery

## 2016-08-10 DIAGNOSIS — K922 Gastrointestinal hemorrhage, unspecified: Secondary | ICD-10-CM

## 2016-08-10 DIAGNOSIS — K921 Melena: Secondary | ICD-10-CM | POA: Diagnosis not present

## 2016-08-10 LAB — CBC WITH DIFFERENTIAL/PLATELET
BASOS PCT: 0.9 % (ref 0.0–3.0)
Basophils Absolute: 0.1 10*3/uL (ref 0.0–0.1)
EOS PCT: 2 % (ref 0.0–5.0)
Eosinophils Absolute: 0.2 10*3/uL (ref 0.0–0.7)
HEMATOCRIT: 48 % (ref 39.0–52.0)
HEMOGLOBIN: 17.1 g/dL — AB (ref 13.0–17.0)
LYMPHS PCT: 18.2 % (ref 12.0–46.0)
Lymphs Abs: 1.6 10*3/uL (ref 0.7–4.0)
MCHC: 35.7 g/dL (ref 30.0–36.0)
MCV: 96.1 fl (ref 78.0–100.0)
MONOS PCT: 8.5 % (ref 3.0–12.0)
Monocytes Absolute: 0.7 10*3/uL (ref 0.1–1.0)
Neutro Abs: 6 10*3/uL (ref 1.4–7.7)
Neutrophils Relative %: 70.4 % (ref 43.0–77.0)
Platelets: 206 10*3/uL (ref 150.0–400.0)
RBC: 4.99 Mil/uL (ref 4.22–5.81)
RDW: 13.3 % (ref 11.5–15.5)
WBC: 8.6 10*3/uL (ref 4.0–10.5)

## 2016-08-10 LAB — HEMOGLOBIN: Hemoglobin: 17.1 g/dL — ABNORMAL HIGH (ref 13.0–17.0)

## 2016-08-13 ENCOUNTER — Telehealth: Payer: Self-pay | Admitting: Gastroenterology

## 2016-08-13 NOTE — Telephone Encounter (Signed)
Dr [name] the pt is available to speak to you now.Marland Kitchen

## 2016-08-13 NOTE — Telephone Encounter (Signed)
We spoke this afternoon.  He's aware of the final path. Already met with Dr. Dalbert Batman, planning for colectomy in early October.  Has EGD a week or two prior

## 2016-08-18 ENCOUNTER — Telehealth: Payer: Self-pay

## 2016-08-18 NOTE — Telephone Encounter (Signed)
-----   Message from [name], MD sent at 08/18/2016  7:51 AM EDT ----- Can you offer him sooner EGD ([facility] if possible or [facility] Thursday if needed).  Currently on for late September but we'd like to get it done sooner.    Thanks   dj

## 2016-08-19 NOTE — Telephone Encounter (Signed)
I offered sooner appt but the pt states he has to have the appt as scheduled because of his work schedule.  Please advise

## 2016-08-19 NOTE — Telephone Encounter (Signed)
Ok, thanks  [place], he cannot come in for a sooner EGD.  FYI

## 2016-08-25 ENCOUNTER — Encounter: Payer: Self-pay | Admitting: Gastroenterology

## 2016-08-31 ENCOUNTER — Encounter: Payer: BLUE CROSS/BLUE SHIELD | Admitting: Gastroenterology

## 2016-09-06 ENCOUNTER — Encounter (HOSPITAL_COMMUNITY)
Admission: RE | Admit: 2016-09-06 | Discharge: 2016-09-06 | Disposition: A | Discharge status: Home / Self Care | Payer: BLUE CROSS/BLUE SHIELD | Source: Ambulatory Visit | Attending: General Surgery | Admitting: General Surgery

## 2016-09-06 ENCOUNTER — Encounter (HOSPITAL_COMMUNITY): Payer: Self-pay

## 2016-09-06 DIAGNOSIS — Z01812 Encounter for preprocedural laboratory examination: Secondary | ICD-10-CM | POA: Insufficient documentation

## 2016-09-06 DIAGNOSIS — C189 Malignant neoplasm of colon, unspecified: Secondary | ICD-10-CM | POA: Diagnosis not present

## 2016-09-06 LAB — COMPREHENSIVE METABOLIC PANEL
ALK PHOS: 70 U/L (ref 38–126)
ALT: 30 U/L (ref 17–63)
AST: 31 U/L (ref 15–41)
Albumin: 4.4 g/dL (ref 3.5–5.0)
Anion gap: 9 (ref 5–15)
BUN: 14 mg/dL (ref 6–20)
CALCIUM: 9.2 mg/dL (ref 8.9–10.3)
CHLORIDE: 108 mmol/L (ref 101–111)
CO2: 23 mmol/L (ref 22–32)
CREATININE: 0.8 mg/dL (ref 0.61–1.24)
GFR calc Af Amer: >60 mL/min (ref >60)
Glucose, Bld: 97 mg/dL (ref 65–99)
Potassium: 3.9 mmol/L (ref 3.5–5.1)
SODIUM: 140 mmol/L (ref 135–145)
Total Bilirubin: 1.1 mg/dL (ref 0.3–1.2)
Total Protein: 6.9 g/dL (ref 6.5–8.1)

## 2016-09-06 LAB — CBC WITH DIFFERENTIAL/PLATELET
BASOS ABS: 0 10*3/uL (ref 0.0–0.1)
Basophils Relative: 0 %
EOS PCT: 3 %
Eosinophils Absolute: 0.2 10*3/uL (ref 0.0–0.7)
HCT: 48.8 % (ref 39.0–52.0)
HEMOGLOBIN: 16.9 g/dL (ref 13.0–17.0)
LYMPHS ABS: 1.4 10*3/uL (ref 0.7–4.0)
LYMPHS PCT: 20 %
MCH: 34.4 pg — AB (ref 26.0–34.0)
MCHC: 34.6 g/dL (ref 30.0–36.0)
MCV: 99.4 fL (ref 78.0–100.0)
Monocytes Absolute: 0.6 10*3/uL (ref 0.1–1.0)
Monocytes Relative: 9 %
Neutro Abs: 4.7 10*3/uL (ref 1.7–7.7)
Neutrophils Relative %: 68 %
PLATELETS: 197 10*3/uL (ref 150–400)
RBC: 4.91 MIL/uL (ref 4.22–5.81)
RDW: 13.1 % (ref 11.5–15.5)
WBC: 6.9 10*3/uL (ref 4.0–10.5)

## 2016-09-06 LAB — PROTIME-INR
INR: 0.98
Prothrombin Time: 13 seconds (ref 11.4–15.2)

## 2016-09-06 LAB — TYPE AND SCREEN
ABO/RH(D): A POS
ANTIBODY SCREEN: NEGATIVE

## 2016-09-06 LAB — ABO/RH: ABO/RH(D): A POS

## 2016-09-06 LAB — APTT: aPTT: 34 seconds (ref 24–36)

## 2016-09-06 NOTE — Patient Instructions (Addendum)
Charles Santos  09/06/2016   Your procedure is scheduled on: Friday 09/17/2016  Report to [facility]  Entrance take [place]  elevators to 3rd floor to  [place] at  [place]   AM.  Call this number if you have problems the morning of surgery [phone]   Remember: ONLY 1 PERSON MAY GO WITH YOU TO SHORT STAY TO GET  READY MORNING OF [place].             FOLLOW BOWEL PREP INSTRUCTIONS FROM DR. INGRAMS OFFICE WITH A CLEAR LIQUID DIET ALL DAY THE DAY BEFORE SURGERY TIL MIDNIGHT!           CLEAR LIQUID DIET   Foods Allowed                                                                     Foods Excluded  Coffee and tea, regular and decaf                             liquids that you cannot  Plain Jell-O in any flavor                                             see through such as: Fruit ices (not with fruit pulp)                                     milk, soups, orange juice  Iced Popsicles                                    All solid food Carbonated beverages, regular and diet                                    Cranberry, grape and apple juices Sports drinks like Gatorade Lightly seasoned clear broth or consume(fat free) Sugar, honey syrup  Sample Menu Breakfast                                Lunch                                     Supper Cranberry juice                    Beef broth                            Chicken broth Jell-O  Grape juice                           Apple juice Coffee or tea                        Jell-O                                      Popsicle                                                Coffee or tea                        Coffee or tea  _____________________________________________________________________     Do not eat food or drink liquids :After Midnight.     Take these medicines the morning of surgery with A SIP OF WATER: none                                  You  may not have any metal on your body including hair pins and              piercings  Do not wear jewelry, make-up, lotions, powders or perfumes, deodorant             Do not wear nail polish.  Do not shave  48 hours prior to surgery.              Men may shave face and neck.   Do not bring valuables to the hospital. [place].  Contacts, dentures or bridgework may not be worn into surgery.  Leave suitcase in the car. After surgery it may be brought to your room.     Patients discharged the day of surgery will not be allowed to drive home.  Name and phone number of your driver:  Special Instructions: N/A              Please read over the following fact sheets you were given: _____________________________________________________________________             [facility] - Preparing for Surgery Before surgery, you can play an important role.  Because skin is not sterile, your skin needs to be as free of germs as possible.  You can reduce the number of germs on your skin by washing with CHG (chlorahexidine gluconate) soap before surgery.  CHG is an antiseptic cleaner which kills germs and bonds with the skin to continue killing germs even after washing. Please DO NOT use if you have an allergy to CHG or antibacterial soaps.  If your skin becomes reddened/irritated stop using the CHG and inform your nurse when you arrive at Short Stay. Do not shave (including legs and underarms) for at least 48 hours prior to the first CHG shower.  You may shave your face/neck. Please follow these instructions carefully:  1.  Shower with CHG Soap the night before surgery and the  morning of Surgery.  2.  If you choose  to wash your hair, wash your hair first as usual with your  normal  shampoo.  3.  After you shampoo, rinse your hair and body thoroughly to remove the  shampoo.                           4.  Use CHG as you would any other liquid soap.  You can apply  chg directly  to the skin and wash                       Gently with a scrungie or clean washcloth.  5.  Apply the CHG Soap to your body ONLY FROM THE NECK DOWN.   Do not use on face/ open                           Wound or open sores. Avoid contact with eyes, ears mouth and genitals (private parts).                       Wash face,  Genitals (private parts) with your normal soap.             6.  Wash thoroughly, paying special attention to the area where your surgery  will be performed.  7.  Thoroughly rinse your body with warm water from the neck down.  8.  DO NOT shower/wash with your normal soap after using and rinsing off  the CHG Soap.                9.  Pat yourself dry with a clean towel.            10.  Wear clean pajamas.            11.  Place clean sheets on your bed the night of your first shower and do not  sleep with pets. Day of Surgery : Do not apply any lotions/deodorants the morning of surgery.  Please wear clean clothes to the hospital/surgery center.  FAILURE TO FOLLOW THESE INSTRUCTIONS MAY RESULT IN THE CANCELLATION OF YOUR SURGERY PATIENT SIGNATURE_________________________________  NURSE SIGNATURE__________________________________  ________________________________________________________________________  WHAT IS A BLOOD TRANSFUSION? Blood Transfusion Information  A transfusion is the replacement of blood or some of its parts. Blood is made up of multiple cells which provide different functions.  Red blood cells carry oxygen and are used for blood loss replacement.  White blood cells fight against infection.  Platelets control bleeding.  Plasma helps clot blood.  Other blood products are available for specialized needs, such as hemophilia or other clotting disorders. BEFORE THE TRANSFUSION  Who gives blood for transfusions?   Healthy volunteers who are fully evaluated to make sure their blood is safe. This is blood bank blood. Transfusion therapy is the  safest it has ever been in the practice of medicine. Before blood is taken from a donor, a complete history is taken to make sure that person has no history of diseases nor engages in risky social behavior (examples are intravenous drug use or sexual activity with multiple partners). The donor's travel history is screened to minimize risk of transmitting infections, such as malaria. The donated blood is tested for signs of infectious diseases, such as HIV and hepatitis. The blood is then tested to be sure it is compatible with you in order to minimize the chance of a transfusion reaction. If you  or a relative donates blood, this is often done in anticipation of surgery and is not appropriate for emergency situations. It takes many days to process the donated blood. RISKS AND COMPLICATIONS Although transfusion therapy is very safe and saves many lives, the main dangers of transfusion include:   Getting an infectious disease.  Developing a transfusion reaction. This is an allergic reaction to something in the blood you were given. Every precaution is taken to prevent this. The decision to have a blood transfusion has been considered carefully by your caregiver before blood is given. Blood is not given unless the benefits outweigh the risks. AFTER THE TRANSFUSION  Right after receiving a blood transfusion, you will usually feel much better and more energetic. This is especially true if your red blood cells have gotten low (anemic). The transfusion raises the level of the red blood cells which carry oxygen, and this usually causes an energy increase.  The nurse administering the transfusion will monitor you carefully for complications. HOME CARE INSTRUCTIONS  No special instructions are needed after a transfusion. You may find your energy is better. Speak with your caregiver about any limitations on activity for underlying diseases you may have. SEEK MEDICAL CARE IF:   Your condition is not improving  after your transfusion.  You develop redness or irritation at the intravenous (IV) site. SEEK IMMEDIATE MEDICAL CARE IF:  Any of the following symptoms occur over the next 12 hours:  Shaking chills.  You have a temperature by mouth above 102 F (38.9 C), not controlled by medicine.  Chest, back, or muscle pain.  People around you feel you are not acting correctly or are confused.  Shortness of breath or difficulty breathing.  Dizziness and fainting.  You get a rash or develop hives.  You have a decrease in urine output.  Your urine turns a dark color or changes to pink, red, or brown. Any of the following symptoms occur over the next 10 days:  You have a temperature by mouth above 102 F (38.9 C), not controlled by medicine.  Shortness of breath.  Weakness after normal activity.  The white part of the eye turns yellow (jaundice).  You have a decrease in the amount of urine or are urinating less often.  Your urine turns a dark color or changes to pink, red, or brown. Document Released: 11/26/2000 Document Revised: 02/21/2012 Document Reviewed: 07/15/2008 [facility] Patient Information 2014 [place], [place].  _______________________________________________________________________

## 2016-09-07 ENCOUNTER — Encounter: Payer: Self-pay | Admitting: Gastroenterology

## 2016-09-07 ENCOUNTER — Ambulatory Visit (AMBULATORY_SURGERY_CENTER): Payer: BLUE CROSS/BLUE SHIELD | Admitting: Gastroenterology

## 2016-09-07 ENCOUNTER — Other Ambulatory Visit: Payer: Self-pay

## 2016-09-07 VITALS — BP 117/74 | HR 66 | Temp 97.5°F | Resp 16 | Ht 69.0 in | Wt 202.0 lb

## 2016-09-07 DIAGNOSIS — K2990 Gastroduodenitis, unspecified, without bleeding: Secondary | ICD-10-CM

## 2016-09-07 DIAGNOSIS — Z800 Family history of malignant neoplasm of digestive organs: Secondary | ICD-10-CM

## 2016-09-07 DIAGNOSIS — K3189 Other diseases of stomach and duodenum: Secondary | ICD-10-CM

## 2016-09-07 DIAGNOSIS — K2970 Gastritis, unspecified, without bleeding: Secondary | ICD-10-CM

## 2016-09-07 DIAGNOSIS — Z1509 Genetic susceptibility to other malignant neoplasm: Secondary | ICD-10-CM

## 2016-09-07 LAB — HEMOGLOBIN A1C
HEMOGLOBIN A1C: 4.8 % (ref 4.8–5.6)
Mean Plasma Glucose: 91 mg/dL

## 2016-09-07 LAB — CEA: CEA: 10.2 ng/mL — AB (ref 0.0–4.7)

## 2016-09-07 MED ORDER — SODIUM CHLORIDE 0.9 % IV SOLN: 500.0000 mL | INTRAVENOUS | Status: DC

## 2016-09-07 NOTE — Progress Notes (Signed)
Pt given instructions re: CT per Loel Ro LPN.  Discharge instructions given at that time as well

## 2016-09-07 NOTE — Op Note (Signed)
[facility] Patient Name: Charles Santos Procedure Date: 09/07/2016 9:17 AM MRN: [id number] Endoscopist: [name] , MD Age: 45 Referring MD:  Date of Birth: Jun 04, 1971 Gender: Male Account #: [id number] Procedure:                Upper GI endoscopy Indications:              Screening procedure; known [name]; upcoming                            subtotal colectomy Dr. Dalbert Batman for colon cancer; s/p                            right colectomy many years ago for the same Medicines:                Monitored Anesthesia Care Procedure:                Pre-Anesthesia Assessment:                           - Prior to the procedure, a History and Physical                            was performed, and patient medications and                            allergies were reviewed. The patient's tolerance of                            previous anesthesia was also reviewed. The risks                            and benefits of the procedure and the sedation                            options and risks were discussed with the patient.                            All questions were answered, and informed consent                            was obtained. Prior Anticoagulants: The patient has                            taken no previous anticoagulant or antiplatelet                            agents. ASA Grade Assessment: II - A patient with                            mild systemic disease. After reviewing the risks                            and benefits, the patient was deemed in  satisfactory condition to undergo the procedure.                           After obtaining informed consent, the endoscope was                            passed under direct vision. Throughout the                            procedure, the patient's blood pressure, pulse, and                            oxygen saturations were monitored continuously. The                            Model  GIF-HQ190 [id number]) scope was introduced                            through the mouth, and advanced to the third part                            of duodenum. The upper GI endoscopy was                            accomplished without difficulty. The patient                            tolerated the procedure well. Scope In: Scope Out: Findings:                 The esophagus was normal.                           Mild inflammation characterized by erosions and                            friability was found in the entire examined                            stomach. Biopsies were taken with a cold forceps                            for histology.                           A single 20 mm mucosal lesion was found in the                            second portion of the duodenum (slightly proximal                            and lateral to the major papilla). This is                            suspicious for advanced neoplasm. Biopsies were  taken with a cold forceps for histology, lesion is                            firm. Area was tattooed with an injection of [place]                            ink (one injection on proximal edge and on                            injection on the distal edge). Complications:            No immediate complications. Estimated blood loss:                            None. Estimated Blood Loss:     Estimated blood loss: none. Impression:               - Normal esophagus.                           - Gastritis. Biopsied.                           - 2cm mucosal leion found in the second duodenum                            (lays 2cm from the major papilla but does not                            involve the major papilla); firm, suspicious for                            advanced neoplasm; multiple biopsies taken and the                            site was labeled with [place] Ink Recommendation:           - Patient has a contact number available  for                            emergencies. The signs and symptoms of potential                            delayed complications were discussed with the                            patient. Return to normal activities tomorrow.                            Written discharge instructions were provided to the                            patient.                           - Resume previous diet.                           -  Continue present medications.                           - Await pathology results (sent rush)                           - My office will arrange for CT enterography with                            IV contrast to check for further small bowel                            lesions.                           - Will communicate these findings with Drs. Gaylord Shih. [name], MD 09/07/2016 9:44:58 AM This report has been signed electronically.

## 2016-09-07 NOTE — Progress Notes (Signed)
To recovery, report to [place], RN, VSS 

## 2016-09-07 NOTE — Progress Notes (Signed)
Called to room to assist during endoscopic procedure.  Patient ID and intended procedure confirmed with present staff. Received instructions for my participation in the procedure from the performing physician.  

## 2016-09-07 NOTE — Progress Notes (Signed)
Patient scheduled for enterography abn/pelvis at [facility] for 09/10/16 3:00.  He will need to arrive at 1:40.  4 hours of no solids prior.  Riki Sheer, LPN notified and will relay to the patient

## 2016-09-07 NOTE — Patient Instructions (Signed)
YOU HAD AN ENDOSCOPIC PROCEDURE TODAY AT [facility]:   Refer to the procedure report that was given to you for any specific questions about what was found during the examination.  If the procedure report does not answer your questions, please call your gastroenterologist to clarify.  If you requested that your care partner not be given the details of your procedure findings, then the procedure report has been included in a sealed envelope for you to review at your convenience later.  YOU SHOULD EXPECT: Some feelings of bloating in the abdomen. Passage of more gas than usual.  Walking can help get rid of the air that was put into your GI tract during the procedure and reduce the bloating.  Please Note:  You might notice some irritation and congestion in your nose or some drainage.  This is from the oxygen used during your procedure.  There is no need for concern and it should clear up in a day or so.  SYMPTOMS TO REPORT IMMEDIATELY:   Following upper endoscopy (EGD)  Vomiting of blood or coffee ground material  New chest pain or pain under the shoulder blades  Painful or persistently difficult swallowing  New shortness of breath  Fever of 100F or higher  Black, tarry-looking stools  For urgent or emergent issues, a gastroenterologist can be reached at any hour by calling [phone].   DIET:  We do recommend a small meal at first, but then you may proceed to your regular diet.  Drink plenty of fluids but you should avoid alcoholic beverages for 24 hours.  ACTIVITY:  You should plan to take it easy for the rest of today and you should NOT DRIVE or use heavy machinery until tomorrow (because of the sedation medicines used during the test).    FOLLOW UP: Our staff will call the number listed on your records the next business day following your procedure to check on you and address any questions or concerns that you may have regarding the information given to you following  your procedure. If we do not reach you, we will leave a message.  However, if you are feeling well and you are not experiencing any problems, there is no need to return our call.  We will assume that you have returned to your regular daily activities without incident.  If any biopsies were taken you will be contacted by phone or by letter within the next 1-3 weeks.  Please call us at [phone] if you have not heard about the biopsies in 3 weeks.   SIGNATURES/CONFIDENTIALITY: You and/or your care partner have signed paperwork which will be entered into your electronic medical record.  These signatures attest to the fact that that the information above on your After Visit Summary has been reviewed and is understood.  Full responsibility of the confidentiality of this discharge information lies with you and/or your care-partner.  Please read over handout about gastritis  Your appointment for your CT is going to be on Friday, September 29th at [facility] Nothing to eat or drink after 11:00 a.m that day.  Please arrive at the office at 1:40, where they have you drink the contrast.  Please bring something to read/keep you occupied until 3:00 p.m which is when they will do the CT scan.    Please continue medications

## 2016-09-08 ENCOUNTER — Telehealth: Payer: Self-pay | Admitting: Gastroenterology

## 2016-09-08 ENCOUNTER — Telehealth: Payer: Self-pay

## 2016-09-08 ENCOUNTER — Telehealth: Payer: Self-pay | Admitting: *Deleted

## 2016-09-08 NOTE — Telephone Encounter (Signed)
  Follow up Call-  Call back number 09/07/2016 08/04/2016  Post procedure Call Back phone  # [phone] cell [phone]  Permission to leave phone message Yes Yes  Some recent data might be hidden    Patient was called for follow up after his procedure on 09/07/2016. No answer at the number given for follow up phone call. This was the second attempt to reach the patient. A message was left on the answering machine.

## 2016-09-08 NOTE — Telephone Encounter (Signed)
Message left

## 2016-09-09 ENCOUNTER — Telehealth: Payer: Self-pay

## 2016-09-09 NOTE — Telephone Encounter (Signed)
Left message on machine to call back  

## 2016-09-09 NOTE — Telephone Encounter (Signed)
The pt questions were answered and he is aware that the path has been received but not reviewed.  We will call as soon as available.

## 2016-09-09 NOTE — Telephone Encounter (Signed)
Recall flex in EPIC for 1 year, referral to Dr Okey Dupre.  Records and referral form faxed to [phone]

## 2016-09-09 NOTE — Telephone Encounter (Signed)
-----   Message from Charles Banister, MD sent at 09/09/2016 11:22 AM EDT ----- Madaline Brilliant, I talked with him.  He understands the plan is to go ahead with colon surgery next week.  Still getting the CT enterography tomorrow. We'll set him up to meet Dr. Lysle Rubens to discuss endoscopic resection.  Thanks.    James Senn, He needs to be referred to Dr. Gayleen Orem at [place].  He has [name], undergoing subtotal colectomy next week for colon cancer (his second bout with it).  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  Send him copies of colonoscopy, EGD, path reports. Also put him in for recall flex sign in one year if not already in our system.  Thanks    ----- Message ----- From: Stark Klein, MD Sent: 09/09/2016  10:54 AM To: Charles Banister, MD, [name], MD  If it can be resected endoscopically, that would be better for the patient.  Think 2 stage plan is good. fb  ----- Message ----- From: [name], MD Sent: 09/09/2016   7:22 AM To: Charles Banister, MD, Stark Klein, MD, #  Linna Hoff and Dorris Fetch,  If you think the duodenal adenoma  has a reasonable probability of being resectable endoscopically, then I have no problem going ahead with his colon cancer surgery first as scheduled Oct 6.  It would probably be 4 weeks before he could get the endoscopic resection of his duodenal adenoma from my point of view.  One usual caveat is that  if he were to suffer a major complication of the colon surgery, then that could significantly delay intervention on the duodenal adenoma..  I think that the risk of a major complication is somewhere between 5 and 7%.  Dorris Fetch, what do you think about this two-stage plan?  [place] ----- Message ----- From: Charles Banister, MD Sent: 09/09/2016   6:59 AM To: Stark Klein, MD, [name], MD  The biopsies of the duodenal lesion show adenoma (no cancer or HGD).  It is not removable endoscopically, at least not in  [place].  He is getting the CT enterography tomorrow to check for other small bowel abnormalities and I'll let everyone know the results.    I am planning to refer him to [facility] Dr. Gayleen Orem (GI) to attempt endoscopic resection.  Wonder about your thoughts on the timing but I think it may be best to go ahead with subtotal colectomy as planned and then work on the duodenal lesion after he recovers.    Thoughts?

## 2016-09-10 ENCOUNTER — Encounter: Payer: Self-pay | Admitting: Family Medicine

## 2016-09-10 ENCOUNTER — Ambulatory Visit (INDEPENDENT_AMBULATORY_CARE_PROVIDER_SITE_OTHER)
Admission: RE | Admit: 2016-09-10 | Discharge: 2016-09-10 | Disposition: A | Discharge status: Home / Self Care | Payer: BLUE CROSS/BLUE SHIELD | Source: Ambulatory Visit | Attending: Gastroenterology | Admitting: Gastroenterology

## 2016-09-10 ENCOUNTER — Ambulatory Visit (INDEPENDENT_AMBULATORY_CARE_PROVIDER_SITE_OTHER): Payer: BLUE CROSS/BLUE SHIELD | Admitting: Family Medicine

## 2016-09-10 VITALS — BP 136/82 | HR 75 | Temp 97.6°F | Ht 69.0 in | Wt 198.4 lb

## 2016-09-10 DIAGNOSIS — M542 Cervicalgia: Secondary | ICD-10-CM | POA: Diagnosis not present

## 2016-09-10 DIAGNOSIS — Z1509 Genetic susceptibility to other malignant neoplasm: Secondary | ICD-10-CM

## 2016-09-10 DIAGNOSIS — Z720 Tobacco use: Secondary | ICD-10-CM | POA: Diagnosis not present

## 2016-09-10 DIAGNOSIS — F329 Major depressive disorder, single episode, unspecified: Secondary | ICD-10-CM | POA: Diagnosis not present

## 2016-09-10 DIAGNOSIS — Z23 Encounter for immunization: Secondary | ICD-10-CM | POA: Diagnosis not present

## 2016-09-10 DIAGNOSIS — F32A Depression, unspecified: Secondary | ICD-10-CM

## 2016-09-10 MED ORDER — IOPAMIDOL (ISOVUE-300) INJECTION 61%
100.0000 mL | Freq: Once | INTRAVENOUS | Status: AC | PRN
  Administered 2016-09-10: 100 mL via INTRAVENOUS

## 2016-09-10 MED ORDER — PREDNISONE 20 MG PO TABS: 40.0000 mg | ORAL_TABLET | Freq: Every day | ORAL | 0 refills | Status: DC

## 2016-09-10 MED ORDER — CITALOPRAM HYDROBROMIDE 20 MG PO TABS: 20.0000 mg | ORAL_TABLET | Freq: Every day | ORAL | 3 refills | Status: DC

## 2016-09-10 NOTE — Progress Notes (Signed)
   HPI  Patient presents today here with neck pain.  Patient explains over the last week or so he's had severe right-sided neck pain with radiation down his posterior right arm extending to about the level of his elbow. He has numbness and tingling of that area of his arm, however no arm weakness. The pains described as shooting pain. Worse with certain positions like lying in bed, it's fine at work.   Naprosyn helps.  Patient states his depression is stable, he denies suicidal thoughts  Still smoking, he still drinking, however he has cut back quite a bit, he  guesses that he's drinking 2-4 beers per night.  PMH: Smoking status noted ROS: Per HPI  Objective: BP 136/82   Pulse 75   Temp 97.6 F (36.4 C) (Oral)   Ht 5\' 9"  (1.753 m)   Wt 198 lb 6.4 oz (90 kg)   BMI 29.30 kg/m  Gen: NAD, alert, cooperative with exam HEENT: NCAT CV: RRR, good S1/S2, no murmur Resp: CTABL, no wheezes, non-labored Ext: No edema, warm Neuro: Alert and oriented, strength 5/5 and sensation intact in bilateral upper extremities and grip strength.  Skin- rosacea, pustules and papules on the mid face  MSK: Mild tenderness to palpation of the paraspinal muscles in the cervical area on the right side  Assessment and plan:  # Neck pain with radiculopathy Prednisone burst RTC with any concerns, short course given surgery in a few days.   # depression Stable, continue citalopram  # tobacco abuse Discussed- recommended cessation. He is not ready  # flu shot given today    Orders Placed This Encounter  Procedures  . Flu Vaccine QUAD 36+ mos IM    Meds ordered this encounter  Medications  . predniSONE (DELTASONE) 20 MG tablet    Sig: Take 2 tablets (40 mg total) by mouth daily with breakfast.    Dispense:  10 tablet    Refill:  0  . citalopram (CELEXA) 20 MG tablet    Sig: Take 1 tablet (20 mg total) by mouth daily.    Dispense:  30 tablet    Refill:  [place],  MD [facility] 09/10/2016, 9:23 AM

## 2016-09-10 NOTE — Patient Instructions (Signed)
Great to see you!  Take all of your prednisone, I think it will make a big difference for your pain.

## 2016-09-16 NOTE — Anesthesia Preprocedure Evaluation (Addendum)
Anesthesia Evaluation  Patient identified by MRN, date of birth, ID band Patient awake    Reviewed: Allergy & Precautions, NPO status , Patient's Chart, lab work & pertinent test results  History of Anesthesia Complications Negative for: history of anesthetic complications  Airway Mallampati: III  TM Distance: >3 FB Neck ROM: Limited    Dental  (+) Poor Dentition, Dental Advisory Given   Pulmonary neg shortness of breath, sleep apnea (smokes 1.5 ppd) , COPD, neg recent URI, Current Smoker (smokes 1.5 ppd),    Pulmonary exam normal breath sounds clear to auscultation       Cardiovascular Exercise Tolerance: Good (-) hypertension(-) angina(-) Past MI, (-) Cardiac Stents, (-) CABG, (-) Orthopnea, (-) PND and (-) DOE (-) dysrhythmias  Rhythm:Regular Rate:Normal  HLD   Neuro/Psych PSYCHIATRIC DISORDERS Anxiety Depression negative neurological ROS     GI/Hepatic GERD  Controlled,(+)     substance abuse  alcohol use, Recurrent cancer   Endo/Other  negative endocrine ROS  Renal/GU negative Renal ROS     Musculoskeletal   Abdominal   Peds  Hematology negative hematology ROS (+)   Anesthesia Other Findings Patient admits to intermittent numbness and tingling of arms and hands.  Reproductive/Obstetrics                            Anesthesia Physical Anesthesia Plan  ASA: III  Anesthesia Plan: General   Post-op Pain Management:    Induction: Intravenous  Airway Management Planned: Oral ETT  Additional Equipment:   Intra-op Plan:   Post-operative Plan: Extubation in OR  Informed Consent: I have reviewed the patients History and Physical, chart, labs and discussed the procedure including the risks, benefits and alternatives for the proposed anesthesia with the patient or authorized representative who has indicated his/her understanding and acceptance.   Dental advisory given  Plan  Discussed with: CRNA  Anesthesia Plan Comments: (Risks of general anesthesia discussed including, but not limited to, sore throat, hoarse voice, chipped/damaged teeth, injury to vocal cords, nausea and vomiting, allergic reactions, lung infection, heart attack, stroke, and death. All questions answered.  Patient on prednisone 20mg  daily but did not take today. Will give hydrocortisone. )      Anesthesia Quick Evaluation

## 2016-09-16 NOTE — H&P (Signed)
Jeanmarie Plant Location: [facility] Patient #: [id number] DOB: 06-02-1971 Separated / Language: Cleophus Molt / Race: White Male       History of Present Illness  The patient is a 45 year old male who presents with a complaint of recurrent colon cancer, transverse colon. This is [name] who lives in [place]. He is referred by Dr. Owens Loffler because of a newly diagnosed cancer in the transverse colon. Significant past history of a right colectomy, presumably, in [place] at age 75 for colon cancer with postop chemotherapy. Presumably he has [name]. Dr. Laroy Apple is his PCP.  At age 33 he was having right lower quadrant pain and had CT scans and ultimately a colonoscopy and then underwent a colon resection for colon cancer from which he recovered. He had postop chemotherapy, and a note from [facility] in 2011 states that he had 5-FU and leucovorin according to old records. He also states that he had a CEA of 239 preoperatively in [place], but the only lab report they documented is CEA of 4.4. He states that genetic testing was done at [facility] in 2011. We are requesting those records but may not get them. He moved to [place] in the year 2000. He says he gets colonoscopies every 2 or 3 years. There is a colonoscopy report from [facility] in 2002 which showed hepatic flexure anastomosis and no polyps. He underwent elective colonoscopy by Dr. [name] on August 23 and Dr. [name] found a non-circumferential ulcerated mass in the transverse colon and 2 polyps in the sigmoid colon. He describes an anastomosis proximal to the cancer. He says the cancer was in the transverse colon. Pathology shows adenocarcinoma. 2 sigmoid polyp: Polyps were removed and showed villous adenoma. He was admitted to the hospital to a couple days later with post-polypectomy bleeding which stopped spontaneously.  He states that his biologic  father had colon cancer. He doesn't know of any other family members. His father was his mother's second husband. The patient has 2 sons. They were evaluated at [facility] in 2011. Reportedly the patient and one of his sons had the gene for colon cancer and the second son did not. The son with the gene for colon cancer had lots of medical problems and has died of pneumonia subsequently.  Patient's comorbidities are daily tobacco use. Daily alcohol use 9-10 beers a day.  Dr. Laroy Apple is his PCP and is trying to help him with substance dependence He's had a right inguinal hernia repair. Right colectomy.  He is separated. Lives with his 53 year old son. Drinks about 10 beers a day. Smokes about a pack of cigarettes per day. Lives in [place]. Works at a [employer] in [employer]. Fairly strenuous job. He denies alcohol withdrawal syndrome but hasn't stopped daily drinking a long time. Denies drinking in the morning.  I drew pictures of what I presumed his anatomy was. Abdomen CT scan suggested a right cecal resection. No metastasis. CT chest is normal. I reviewed his pathology. I'm going to add him onto GI case conference as soon as possible...Marland KitchenMarland Kitchenthis was done and the consensus surgical recommendation was subtotal colectomy to distal sigmoid or proximal rectum. He will be scheduled for subtotal colectomy. We talked about the extent of surgery and I told him that we would resect from the prior anastomosis all the way down to the mid sigmoid and he was still be able to have  bowel movements per rectum although this might be more frequent. He says he already has 3 or 4 bowel movements a day. This may be due to the beer intake. We will do this through his long midline incision.  I discussed the indications, techniques, and numerous risk of this surgery with him. He is aware the risk of bleeding, infection, wound dehiscence, wound  hernia, injury to adjacent organs with major reconstructive surgery, anastomotic leak requiring reoperation and diverting ostomy, pneumonia, cardiac, thromboembolic problems, alcohol and tobacco withdrawal. He understands all these issues well. All of his questions were answered. He agrees with this plan.  He was instructed in mechanical and antibiotic bowel prep He requests that we do the surgery at [facility]  I have requested records from [place], [facility], and subsequent records from Dr. [name].  Addendum: CT enterography is negative for other tumors. Upper endoscopy revealed a neoplastic mass in the second portion of the duodenum.  Biopsy shows adenoma.  Dr. [name] plans for referral After his colon surgery for endoscopic resection.  Dr. [name], Dr. Barry Dienes, and I have discussed this and agree with this plan.     Other Problems  Alcohol Abuse Colon Cancer Inguinal Hernia Sleep Apnea  Past Surgical History Colon Removal - Partial Ventral / Umbilical Hernia Surgery Right.  Allergies  No Known Drug Allergies08/29/2017  Medication History  No Current Medications Medications Reconciled  Social History Alcohol use Moderate alcohol use. Caffeine use Carbonated beverages. Tobacco use Current every day smoker.  Family History  Alcohol Abuse Father. Colon Cancer Father. Depression Sister. Migraine Headache Mother. Seizure disorder Brother.    Review of Systems  General Not Present- Appetite Loss, Chills, Fatigue, Fever, Night Sweats, Weight Gain and Weight Loss. Skin Not Present- Change in Wart/Mole, Dryness, Hives, Jaundice, New Lesions, Non-Healing Wounds, Rash and Ulcer. HEENT Present- Wears glasses/contact lenses. Not Present- Earache, Hearing Loss, Hoarseness, Nose Bleed, Oral Ulcers, Ringing in the Ears, Seasonal Allergies, Sinus Pain, Sore Throat, Visual Disturbances and Yellow Eyes. Respiratory Present-  Wheezing. Not Present- Bloody sputum, Chronic Cough, Difficulty Breathing and Snoring. Cardiovascular Present- Chest Pain. Not Present- Difficulty Breathing Lying Down, Leg Cramps, Palpitations, Rapid Heart Rate, Shortness of Breath and Swelling of Extremities. Musculoskeletal Not Present- Back Pain, Joint Pain, Joint Stiffness, Muscle Pain, Muscle Weakness and Swelling of Extremities. Neurological Not Present- Decreased Memory, Fainting, Headaches, Numbness, Seizures, Tingling, Tremor, Trouble walking and Weakness.  Vitals  Weight: 198 lb Height: 70in Body Surface Area: 2.08 m Body Mass Index: 28.41 kg/m  Temp.: 62F(Temporal)  Pulse: 79 (Regular)  BP: 124/74 (Sitting, Left Arm, Standard)    Physical Exam  General Mental Status-Alert. General Appearance-Consistent with stated age. Hydration-Well hydrated. Voice-Normal.  Head and Neck Head-normocephalic, atraumatic with no lesions or palpable masses. Trachea-midline. Thyroid Gland Characteristics - normal size and consistency.  Eye Eyeball - Bilateral-Extraocular movements intact. Sclera/Conjunctiva - Bilateral-No scleral icterus.  Chest and Lung Exam Chest and lung exam reveals -quiet, even and easy respiratory effort with no use of accessory muscles and on auscultation, normal breath sounds, no adventitious sounds and normal vocal resonance. Inspection Chest Wall - Normal. Back - normal.  Cardiovascular Cardiovascular examination reveals -normal heart sounds, regular rate and rhythm with no murmurs and normal pedal pulses bilaterally.  Abdomen Note: Abdomen slightly protuberant. Liver and spleen not enlarged. Long midline incision both above and below umbilicus. No hernia. No mass. Right inguinal scar but no recurrence of his right inguinal hernia. Good femoral pulses. No  inguinal adenopathy.   Neurologic Neurologic evaluation reveals -alert and oriented x 3 with no impairment of  recent or remote memory. Mental Status-Normal.  Neuropsychiatric Note: Alert and oriented 4. Gait normal. He looks a little anxious and a little stressed. Shirt is dirty.   Musculoskeletal Normal Exam - Left-Upper Extremity Strength Normal and Lower Extremity Strength Normal. Normal Exam - Right-Upper Extremity Strength Normal and Lower Extremity Strength Normal.  Lymphatic Head & Neck  General Head & Neck Lymphatics: Bilateral - Description - Normal. Axillary  General Axillary Region: Bilateral - Description - Normal. Tenderness - Non Tender. Femoral & Inguinal  Generalized Femoral & Inguinal Lymphatics: Bilateral - Description - Normal. Tenderness - Non Tender.    Assessment & Plan  CANCER OF TRANSVERSE COLON (C18.4) Current Plans Pt Education - Pamphlet Given - Colorectal Surgery: discussed with patient and provided information. Started Neomycin Sulfate 500MG , 2 (two) Tablet SEE NOTE, #6, 08/10/2016, No Refill. Local Order: TAKE TWO TABLETS AT 2 PM, 3 PM, AND 10 PM THE DAY PRIOR TO SURGERY Started Flagyl 500MG , 2 (two) Tablet SEE NOTE, #6, 08/10/2016, No Refill. Local Order: Take at 2pm, 3pm, and 10pm the day prior to your colon operation    You have been diagnosed with a small colon cancer in your transverse colon You had a previous colon cancer in the right colon at age 71. Records are pending. You state that you have a genetic mutation for colon cancer. Records are pending  We discussed the surgical approach in this case. You will be scheduled for a subtotal colectomy. This means that I will remove the transverse colon and sigmoid colon and hook the small intestine up to the mid sigmoid colon. This will leave you with 12-18 inches of colon so that she can have normal bowel movements, although they will be much more frequent than they are now This will avoid a colostomy You will need to undergo frequent screening in the future because there is still a  possibility of developing a third colon cancer in this remaining segment You have accepted this and stated that you did not want a colostomy.  We have discussed the indications, techniques, and numerous risk of this surgery in detail.  You will be required to undergo a bowel prep with lots of laxative the day before surgery You will also need to be on a liquid diet for 2 days prior to the surgery You will also need to take antibiotic pills one day prior to surgery  We will try to help you with tobacco and alcohol withdrawal issues in the hospital You'll be referred to a medical oncologist postop  Please read over all of the patient information booklets and printed material that we gave you.  HISTORY OF COLON CANCER (Z85.038) Impression: Presumably ascending colon cancer age 53. FAMILY HISTORY OF COLON CANCER IN FATHER (Z80.0) TOBACCO ABUSE (Z72.0) Impression: At least 1 pack per day ALCOHOL ABUSE (F10.10) Impression: Estimate 9 beers per day on a daily basis HISTORY OF RIGHT INGUINAL HERNIA REPAIR (Z98.890) [facility] SYNDROME (Z15.09) Impression: Records pending. States he has genetic mutation for colon cancer. Diagnosed at [facility] 2011. Records pending.    Edsel Petrin. Dalbert Batman, M.D., [facility] General and Minimally invasive Surgery Breast and Colorectal Surgery Office:   [phone] Pager:   [phone]

## 2016-09-17 ENCOUNTER — Inpatient Hospital Stay (HOSPITAL_COMMUNITY): Payer: BLUE CROSS/BLUE SHIELD | Admitting: Anesthesiology

## 2016-09-17 ENCOUNTER — Encounter (HOSPITAL_COMMUNITY): Payer: Self-pay | Admitting: *Deleted

## 2016-09-17 ENCOUNTER — Inpatient Hospital Stay (HOSPITAL_COMMUNITY)
Admission: RE | Admit: 2016-09-17 | Discharge: 2016-09-23 | DRG: 330 | Disposition: A | Discharge status: Home / Self Care | Payer: BLUE CROSS/BLUE SHIELD | Source: Ambulatory Visit | Attending: General Surgery | Admitting: General Surgery

## 2016-09-17 ENCOUNTER — Encounter (HOSPITAL_COMMUNITY)
Admission: RE | Disposition: A | Discharge status: Home / Self Care | Payer: Self-pay | Source: Ambulatory Visit | Attending: General Surgery

## 2016-09-17 DIAGNOSIS — K660 Peritoneal adhesions (postprocedural) (postinfection): Secondary | ICD-10-CM | POA: Diagnosis present

## 2016-09-17 DIAGNOSIS — Z1509 Genetic susceptibility to other malignant neoplasm: Secondary | ICD-10-CM

## 2016-09-17 DIAGNOSIS — F1011 Alcohol abuse, in remission: Secondary | ICD-10-CM | POA: Diagnosis present

## 2016-09-17 DIAGNOSIS — Z820 Family history of epilepsy and other diseases of the nervous system: Secondary | ICD-10-CM | POA: Diagnosis not present

## 2016-09-17 DIAGNOSIS — Z86010 Personal history of colonic polyps: Secondary | ICD-10-CM | POA: Diagnosis not present

## 2016-09-17 DIAGNOSIS — Z800 Family history of malignant neoplasm of digestive organs: Secondary | ICD-10-CM | POA: Diagnosis not present

## 2016-09-17 DIAGNOSIS — G4730 Sleep apnea, unspecified: Secondary | ICD-10-CM | POA: Diagnosis present

## 2016-09-17 DIAGNOSIS — F1010 Alcohol abuse, uncomplicated: Secondary | ICD-10-CM | POA: Diagnosis present

## 2016-09-17 DIAGNOSIS — R197 Diarrhea, unspecified: Secondary | ICD-10-CM | POA: Diagnosis present

## 2016-09-17 DIAGNOSIS — Z811 Family history of alcohol abuse and dependence: Secondary | ICD-10-CM

## 2016-09-17 DIAGNOSIS — Z85038 Personal history of other malignant neoplasm of large intestine: Secondary | ICD-10-CM

## 2016-09-17 DIAGNOSIS — F17210 Nicotine dependence, cigarettes, uncomplicated: Secondary | ICD-10-CM | POA: Diagnosis present

## 2016-09-17 DIAGNOSIS — C184 Malignant neoplasm of transverse colon: Secondary | ICD-10-CM | POA: Diagnosis present

## 2016-09-17 DIAGNOSIS — Z9049 Acquired absence of other specified parts of digestive tract: Secondary | ICD-10-CM | POA: Diagnosis not present

## 2016-09-17 DIAGNOSIS — K567 Ileus, unspecified: Secondary | ICD-10-CM | POA: Diagnosis not present

## 2016-09-17 DIAGNOSIS — Z720 Tobacco use: Secondary | ICD-10-CM | POA: Diagnosis present

## 2016-09-17 HISTORY — PX: LAPAROSCOPIC SUBTOTAL COLECTOMY: SHX5930

## 2016-09-17 HISTORY — PX: PROCTOSCOPY: SHX2266

## 2016-09-17 HISTORY — DX: Malignant neoplasm of transverse colon: C18.4

## 2016-09-17 LAB — CREATININE, SERUM
Creatinine, Ser: 1.13 mg/dL (ref 0.61–1.24)
GFR calc Af Amer: >60 mL/min (ref >60)

## 2016-09-17 LAB — CBC
HEMATOCRIT: 52 % (ref 39.0–52.0)
HEMOGLOBIN: 17.3 g/dL — AB (ref 13.0–17.0)
MCH: 34.1 pg — ABNORMAL HIGH (ref 26.0–34.0)
MCHC: 33.3 g/dL (ref 30.0–36.0)
MCV: 102.4 fL — ABNORMAL HIGH (ref 78.0–100.0)
Platelets: 210 10*3/uL (ref 150–400)
RBC: 5.08 MIL/uL (ref 4.22–5.81)
RDW: 13.5 % (ref 11.5–15.5)
WBC: 15.1 10*3/uL — ABNORMAL HIGH (ref 4.0–10.5)

## 2016-09-17 SURGERY — LAPAROSCOPIC SUBTOTAL COLECTOMY
Anesthesia: General | Site: Abdomen

## 2016-09-17 MED ORDER — FENTANYL CITRATE (PF) 100 MCG/2ML IJ SOLN
INTRAMUSCULAR | Status: AC
  Filled 2016-09-17: qty 4

## 2016-09-17 MED ORDER — HYDROMORPHONE HCL 1 MG/ML IJ SOLN
0.5000 mg | Freq: Once | INTRAMUSCULAR | Status: AC
  Administered 2016-09-17: 0.5 mg via INTRAVENOUS

## 2016-09-17 MED ORDER — HYDROMORPHONE HCL 1 MG/ML IJ SOLN
INTRAMUSCULAR | Status: DC | PRN
  Administered 2016-09-17 (×2): 1 mg via INTRAVENOUS

## 2016-09-17 MED ORDER — ACETAMINOPHEN 10 MG/ML IV SOLN
INTRAVENOUS | Status: DC | PRN
  Administered 2016-09-17: 1000 mg via INTRAVENOUS

## 2016-09-17 MED ORDER — SODIUM CHLORIDE 0.9 % IJ SOLN
INTRAMUSCULAR | Status: AC
  Filled 2016-09-17: qty 10

## 2016-09-17 MED ORDER — LIDOCAINE 2% (20 MG/ML) 5 ML SYRINGE
INTRAMUSCULAR | Status: AC
  Filled 2016-09-17: qty 15

## 2016-09-17 MED ORDER — MIDAZOLAM HCL 5 MG/5ML IJ SOLN
INTRAMUSCULAR | Status: DC | PRN
  Administered 2016-09-17 (×2): 1 mg via INTRAVENOUS

## 2016-09-17 MED ORDER — BUPIVACAINE HCL (PF) 0.5 % IJ SOLN
INTRAMUSCULAR | Status: AC
Start: 2016-09-17 — End: 2016-09-17
  Filled 2016-09-17: qty 30

## 2016-09-17 MED ORDER — NICOTINE 21 MG/24HR TD PT24
21.0000 mg | MEDICATED_PATCH | Freq: Every day | TRANSDERMAL | Status: DC
  Administered 2016-09-17 – 2016-09-22 (×5): 21 mg via TRANSDERMAL
  Filled 2016-09-17 (×6): qty 1

## 2016-09-17 MED ORDER — CEFOTETAN DISODIUM-DEXTROSE 2-2.08 GM-% IV SOLR
INTRAVENOUS | Status: AC
Start: 2016-09-17 — End: 2016-09-17
  Filled 2016-09-17: qty 50

## 2016-09-17 MED ORDER — ROCURONIUM BROMIDE 10 MG/ML (PF) SYRINGE
PREFILLED_SYRINGE | INTRAVENOUS | Status: AC
  Filled 2016-09-17: qty 10

## 2016-09-17 MED ORDER — LACTATED RINGERS IV SOLN
INTRAVENOUS | Status: DC | PRN
  Administered 2016-09-17 (×2): via INTRAVENOUS

## 2016-09-17 MED ORDER — OXYCODONE-ACETAMINOPHEN 5-325 MG PO TABS
1.0000 | ORAL_TABLET | ORAL | Status: DC | PRN
  Administered 2016-09-21 – 2016-09-22 (×8): 2 via ORAL
  Filled 2016-09-17 (×8): qty 2

## 2016-09-17 MED ORDER — FENTANYL CITRATE (PF) 100 MCG/2ML IJ SOLN
INTRAMUSCULAR | Status: DC | PRN
  Administered 2016-09-17 (×2): 50 ug via INTRAVENOUS
  Administered 2016-09-17 (×2): 100 ug via INTRAVENOUS
  Administered 2016-09-17 (×2): 50 ug via INTRAVENOUS
  Administered 2016-09-17: 100 ug via INTRAVENOUS

## 2016-09-17 MED ORDER — PNEUMOCOCCAL VAC POLYVALENT 25 MCG/0.5ML IJ INJ
0.5000 mL | INJECTION | INTRAMUSCULAR | Status: DC
  Filled 2016-09-17: qty 0.5

## 2016-09-17 MED ORDER — HYDROMORPHONE HCL 2 MG/ML IJ SOLN
INTRAMUSCULAR | Status: AC
  Filled 2016-09-17: qty 1

## 2016-09-17 MED ORDER — SODIUM CHLORIDE 0.9 % IV BOLUS (SEPSIS)
500.0000 mL | Freq: Once | INTRAVENOUS | Status: AC
  Administered 2016-09-17: 500 mL via INTRAVENOUS

## 2016-09-17 MED ORDER — ONDANSETRON HCL 4 MG/2ML IJ SOLN
INTRAMUSCULAR | Status: AC
  Filled 2016-09-17: qty 2

## 2016-09-17 MED ORDER — ONDANSETRON HCL 4 MG/2ML IJ SOLN: 4.0000 mg | Freq: Four times a day (QID) | INTRAMUSCULAR | Status: DC | PRN

## 2016-09-17 MED ORDER — ENOXAPARIN SODIUM 40 MG/0.4ML ~~LOC~~ SOLN
40.0000 mg | SUBCUTANEOUS | Status: DC
  Administered 2016-09-18 – 2016-09-23 (×6): 40 mg via SUBCUTANEOUS
  Filled 2016-09-17 (×6): qty 0.4

## 2016-09-17 MED ORDER — FENTANYL CITRATE (PF) 100 MCG/2ML IJ SOLN
INTRAMUSCULAR | Status: AC
  Filled 2016-09-17: qty 2

## 2016-09-17 MED ORDER — MIDAZOLAM HCL 2 MG/2ML IJ SOLN
INTRAMUSCULAR | Status: AC
  Filled 2016-09-17: qty 2

## 2016-09-17 MED ORDER — LIDOCAINE 2% (20 MG/ML) 5 ML SYRINGE
INTRAMUSCULAR | Status: DC | PRN
  Administered 2016-09-17: 1.5 mg/kg/h via INTRAVENOUS

## 2016-09-17 MED ORDER — SUGAMMADEX SODIUM 500 MG/5ML IV SOLN
INTRAVENOUS | Status: DC | PRN
  Administered 2016-09-17: 300 mg via INTRAVENOUS

## 2016-09-17 MED ORDER — HYDROCORTISONE NA SUCCINATE PF 100 MG IJ SOLR
INTRAMUSCULAR | Status: AC
  Filled 2016-09-17: qty 2

## 2016-09-17 MED ORDER — DIPHENHYDRAMINE HCL 12.5 MG/5ML PO ELIX: 12.5000 mg | ORAL_SOLUTION | Freq: Four times a day (QID) | ORAL | Status: DC | PRN

## 2016-09-17 MED ORDER — POTASSIUM CHLORIDE 2 MEQ/ML IV SOLN
INTRAVENOUS | Status: DC
  Administered 2016-09-17 – 2016-09-22 (×11): via INTRAVENOUS
  Filled 2016-09-17 (×21): qty 1000

## 2016-09-17 MED ORDER — BUPIVACAINE HCL (PF) 0.5 % IJ SOLN
INTRAMUSCULAR | Status: DC | PRN
  Administered 2016-09-17: 15 mL

## 2016-09-17 MED ORDER — ALVIMOPAN 12 MG PO CAPS
12.0000 mg | ORAL_CAPSULE | Freq: Two times a day (BID) | ORAL | Status: DC
  Administered 2016-09-18 – 2016-09-20 (×5): 12 mg via ORAL
  Filled 2016-09-17 (×5): qty 1

## 2016-09-17 MED ORDER — LABETALOL HCL 5 MG/ML IV SOLN
INTRAVENOUS | Status: AC
  Filled 2016-09-17: qty 4

## 2016-09-17 MED ORDER — ALVIMOPAN 12 MG PO CAPS
12.0000 mg | ORAL_CAPSULE | Freq: Once | ORAL | Status: AC
  Administered 2016-09-17: 12 mg via ORAL
  Filled 2016-09-17: qty 1

## 2016-09-17 MED ORDER — LACTATED RINGERS IR SOLN
Status: DC | PRN
  Administered 2016-09-17: 1000 mL

## 2016-09-17 MED ORDER — HYDROMORPHONE 1 MG/ML IV SOLN
INTRAVENOUS | Status: AC
  Filled 2016-09-17: qty 25

## 2016-09-17 MED ORDER — HYDROMORPHONE HCL 1 MG/ML IJ SOLN
INTRAMUSCULAR | Status: AC
  Administered 2016-09-17: 0.5 mg via INTRAVENOUS
  Filled 2016-09-17: qty 1

## 2016-09-17 MED ORDER — SODIUM CHLORIDE 0.9% FLUSH: 9.0000 mL | INTRAVENOUS | Status: DC | PRN

## 2016-09-17 MED ORDER — FENTANYL CITRATE (PF) 100 MCG/2ML IJ SOLN
INTRAMUSCULAR | Status: AC
  Administered 2016-09-17: 50 ug via INTRAVENOUS
  Filled 2016-09-17: qty 2

## 2016-09-17 MED ORDER — PROPOFOL 10 MG/ML IV BOLUS
INTRAVENOUS | Status: AC
  Filled 2016-09-17: qty 20

## 2016-09-17 MED ORDER — ONDANSETRON HCL 4 MG/2ML IJ SOLN
INTRAMUSCULAR | Status: DC | PRN
  Administered 2016-09-17: 4 mg via INTRAVENOUS

## 2016-09-17 MED ORDER — DEXTROSE 5 % IV SOLN
2.0000 g | INTRAVENOUS | Status: AC
  Administered 2016-09-17: 2 g via INTRAVENOUS

## 2016-09-17 MED ORDER — DEXTROSE 5 % IV SOLN
2.0000 g | Freq: Two times a day (BID) | INTRAVENOUS | Status: AC
  Administered 2016-09-17: 2 g via INTRAVENOUS
  Filled 2016-09-17: qty 2

## 2016-09-17 MED ORDER — PROMETHAZINE HCL 25 MG/ML IJ SOLN: 6.2500 mg | INTRAMUSCULAR | Status: DC | PRN

## 2016-09-17 MED ORDER — ARTIFICIAL TEARS OP OINT
TOPICAL_OINTMENT | OPHTHALMIC | Status: AC
  Filled 2016-09-17: qty 3.5

## 2016-09-17 MED ORDER — DIPHENHYDRAMINE HCL 50 MG/ML IJ SOLN: 12.5000 mg | Freq: Four times a day (QID) | INTRAMUSCULAR | Status: DC | PRN

## 2016-09-17 MED ORDER — ONDANSETRON HCL 4 MG PO TABS: 4.0000 mg | ORAL_TABLET | Freq: Four times a day (QID) | ORAL | Status: DC | PRN

## 2016-09-17 MED ORDER — SUGAMMADEX SODIUM 500 MG/5ML IV SOLN
INTRAVENOUS | Status: AC
  Filled 2016-09-17: qty 10

## 2016-09-17 MED ORDER — LIDOCAINE HCL (CARDIAC) 20 MG/ML IV SOLN
INTRAVENOUS | Status: DC | PRN
  Administered 2016-09-17: 100 mg via INTRAVENOUS

## 2016-09-17 MED ORDER — LABETALOL HCL 5 MG/ML IV SOLN
INTRAVENOUS | Status: DC | PRN
  Administered 2016-09-17: 5 mg via INTRAVENOUS

## 2016-09-17 MED ORDER — ROCURONIUM BROMIDE 100 MG/10ML IV SOLN
INTRAVENOUS | Status: DC | PRN
  Administered 2016-09-17: 10 mg via INTRAVENOUS
  Administered 2016-09-17: 30 mg via INTRAVENOUS
  Administered 2016-09-17: 20 mg via INTRAVENOUS
  Administered 2016-09-17: 50 mg via INTRAVENOUS

## 2016-09-17 MED ORDER — PROPOFOL 10 MG/ML IV BOLUS
INTRAVENOUS | Status: DC | PRN
  Administered 2016-09-17: 25 mg via INTRAVENOUS
  Administered 2016-09-17: 50 mg via INTRAVENOUS
  Administered 2016-09-17: 175 mg via INTRAVENOUS

## 2016-09-17 MED ORDER — 0.9 % SODIUM CHLORIDE (POUR BTL) OPTIME
TOPICAL | Status: DC | PRN
  Administered 2016-09-17: 6000 mL

## 2016-09-17 MED ORDER — HYDROMORPHONE 1 MG/ML IV SOLN
INTRAVENOUS | Status: DC
  Administered 2016-09-17: 11:00:00 via INTRAVENOUS
  Administered 2016-09-17: 0.9 mg via INTRAVENOUS
  Administered 2016-09-18: 1.8 mg via INTRAVENOUS
  Administered 2016-09-18: 2.1 mg via INTRAVENOUS
  Administered 2016-09-18: 1.2 mg via INTRAVENOUS
  Administered 2016-09-18: 2.4 mg via INTRAVENOUS
  Administered 2016-09-18 – 2016-09-19 (×3): 0.9 mg via INTRAVENOUS
  Administered 2016-09-19: 0.3 mg via INTRAVENOUS
  Administered 2016-09-19: 19:00:00 via INTRAVENOUS
  Administered 2016-09-19 (×2): 0.9 mg via INTRAVENOUS
  Administered 2016-09-19: 0.6 mg via INTRAVENOUS
  Administered 2016-09-19: 1.2 mg via INTRAVENOUS
  Administered 2016-09-20: 1.8 mg via INTRAVENOUS
  Administered 2016-09-20: 0.6 mg via INTRAVENOUS
  Administered 2016-09-20: 1.5 mg via INTRAVENOUS
  Administered 2016-09-20: 0.3 mg via INTRAVENOUS
  Administered 2016-09-20: 0.6 mg via INTRAVENOUS
  Administered 2016-09-20: 1.8 mg via INTRAVENOUS
  Administered 2016-09-20: 1.2 mg via INTRAVENOUS
  Administered 2016-09-21: 0.9 mg via INTRAVENOUS
  Filled 2016-09-17: qty 25

## 2016-09-17 MED ORDER — NALOXONE HCL 0.4 MG/ML IJ SOLN: 0.4000 mg | INTRAMUSCULAR | Status: DC | PRN

## 2016-09-17 MED ORDER — HYDROMORPHONE HCL 1 MG/ML IJ SOLN
INTRAMUSCULAR | Status: AC
  Filled 2016-09-17: qty 1

## 2016-09-17 MED ORDER — HYDROMORPHONE HCL 1 MG/ML IJ SOLN
0.2500 mg | INTRAMUSCULAR | Status: DC | PRN
  Administered 2016-09-17 (×4): 0.5 mg via INTRAVENOUS

## 2016-09-17 MED ORDER — SUCCINYLCHOLINE CHLORIDE 20 MG/ML IJ SOLN
INTRAMUSCULAR | Status: DC | PRN
  Administered 2016-09-17: 40 mg via INTRAVENOUS
  Administered 2016-09-17: 140 mg via INTRAVENOUS

## 2016-09-17 MED ORDER — HYDROCORTISONE NA SUCCINATE PF 1000 MG IJ SOLR
INTRAMUSCULAR | Status: DC | PRN
  Administered 2016-09-17: 100 mg via INTRAVENOUS

## 2016-09-17 MED ORDER — FENTANYL CITRATE (PF) 100 MCG/2ML IJ SOLN
25.0000 ug | INTRAMUSCULAR | Status: DC | PRN
  Administered 2016-09-17 (×2): 50 ug via INTRAVENOUS

## 2016-09-17 MED ORDER — ACETAMINOPHEN 10 MG/ML IV SOLN
INTRAVENOUS | Status: AC
  Filled 2016-09-17: qty 100

## 2016-09-17 SURGICAL SUPPLY — 67 items
APPLIER CLIP 5 13 M/L LIGAMAX5 (MISCELLANEOUS)
APPLIER CLIP ROT 10 11.4 M/L (STAPLE)
APR CLP MED LRG 11.4X10 (STAPLE)
APR CLP MED LRG 5 ANG JAW (MISCELLANEOUS)
BLADE EXTENDED COATED 6.5IN (ELECTRODE) ×3 IMPLANT
BLADE HEX COATED 2.75 (ELECTRODE) ×4 IMPLANT
BRR ADH 5X3 SEPRAFILM 6 SHT (MISCELLANEOUS) ×2
CELLS DAT CNTRL 66122 CELL SVR (MISCELLANEOUS) IMPLANT
CLIP APPLIE 5 13 M/L LIGAMAX5 (MISCELLANEOUS) IMPLANT
CLIP APPLIE ROT 10 11.4 M/L (STAPLE) IMPLANT
COUNTER NEEDLE 20 DBL MAG RED (NEEDLE) ×3 IMPLANT
COVER SURGICAL LIGHT HANDLE (MISCELLANEOUS) ×4 IMPLANT
DECANTER SPIKE VIAL GLASS SM (MISCELLANEOUS) ×2 IMPLANT
DRAPE LAPAROSCOPIC ABDOMINAL (DRAPES) ×3 IMPLANT
DRAPE UTILITY XL STRL (DRAPES) ×6 IMPLANT
DRSG OPSITE POSTOP 4X10 (GAUZE/BANDAGES/DRESSINGS) IMPLANT
DRSG OPSITE POSTOP 4X12 (GAUZE/BANDAGES/DRESSINGS) ×1 IMPLANT
DRSG OPSITE POSTOP 4X6 (GAUZE/BANDAGES/DRESSINGS) IMPLANT
DRSG OPSITE POSTOP 4X8 (GAUZE/BANDAGES/DRESSINGS) IMPLANT
DRSG TEGADERM 2-3/8X2-3/4 SM (GAUZE/BANDAGES/DRESSINGS) ×1 IMPLANT
DRSG TELFA 3X8 NADH (GAUZE/BANDAGES/DRESSINGS) ×3 IMPLANT
ELECT REM PT RETURN 9FT ADLT (ELECTROSURGICAL) ×3
ELECTRODE REM PT RTRN 9FT ADLT (ELECTROSURGICAL) ×2 IMPLANT
GAUZE SPONGE 2X2 8PLY STRL LF (GAUZE/BANDAGES/DRESSINGS) IMPLANT
GAUZE SPONGE 4X4 12PLY STRL (GAUZE/BANDAGES/DRESSINGS) IMPLANT
GLOVE EUDERMIC 7 POWDERFREE (GLOVE) ×6 IMPLANT
GOWN STRL REUS W/TWL XL LVL3 (GOWN DISPOSABLE) ×18 IMPLANT
HEMOSTAT SNOW SURGICEL 2X4 (HEMOSTASIS) ×2 IMPLANT
IRRIG SUCT STRYKERFLOW 2 WTIP (MISCELLANEOUS) ×3
IRRIGATION SUCT STRKRFLW 2 WTP (MISCELLANEOUS) ×2 IMPLANT
LEGGING LITHOTOMY PAIR STRL (DRAPES) ×3 IMPLANT
PACK COLON (CUSTOM PROCEDURE TRAY) ×3 IMPLANT
PAD DRESSING TELFA 3X8 NADH (GAUZE/BANDAGES/DRESSINGS) IMPLANT
PAD POSITIONING PINK XL (MISCELLANEOUS) ×1 IMPLANT
RELOAD PROXIMATE 75MM BLUE (ENDOMECHANICALS) ×6 IMPLANT
RELOAD STAPLE 75 3.8 BLU REG (ENDOMECHANICALS) IMPLANT
RETRACTOR WND ALEXIS 18 MED (MISCELLANEOUS) IMPLANT
RTRCTR WOUND ALEXIS 18CM MED (MISCELLANEOUS)
SCISSORS LAP 5X35 DISP (ENDOMECHANICALS) ×3 IMPLANT
SEALER TISSUE X1 CVD JAW (INSTRUMENTS) ×1 IMPLANT
SEPRAFILM PROCEDURAL PACK 3X5 (MISCELLANEOUS) ×1 IMPLANT
SHEARS HARMONIC ACE PLUS 36CM (ENDOMECHANICALS) ×1 IMPLANT
SLEEVE XCEL OPT CAN 5 100 (ENDOMECHANICALS) ×6 IMPLANT
SOLUTION ANTI FOG 6CC (MISCELLANEOUS) ×3 IMPLANT
SPONGE GAUZE 2X2 STER 10/PKG (GAUZE/BANDAGES/DRESSINGS) ×1
SPONGE LAP 18X18 X RAY DECT (DISPOSABLE) ×4 IMPLANT
STAPLER CIRC CVD 29MM 37CM (STAPLE) ×1 IMPLANT
STAPLER CIRC ILS CVD 25MM (STAPLE) ×1 IMPLANT
STAPLER PROXIMATE 75MM BLUE (STAPLE) ×1 IMPLANT
STAPLER VISISTAT 35W (STAPLE) ×3 IMPLANT
STRIP CLOSURE SKIN 1/2X4 (GAUZE/BANDAGES/DRESSINGS) IMPLANT
SUT NOVA 1 T20/GS 25DT (SUTURE) ×2 IMPLANT
SUT PDS AB 1 CT1 27 (SUTURE) IMPLANT
SUT PDS AB 1 TP1 96 (SUTURE) ×2 IMPLANT
SUT PROLENE 2 0 SH DA (SUTURE) ×1 IMPLANT
SUT SILK 2 0 (SUTURE) ×3
SUT SILK 2 0 SH CR/8 (SUTURE) ×3 IMPLANT
SUT SILK 2-0 18XBRD TIE 12 (SUTURE) ×2 IMPLANT
SUT SILK 3 0 (SUTURE) ×3
SUT SILK 3 0 SH CR/8 (SUTURE) ×4 IMPLANT
SUT SILK 3-0 18XBRD TIE 12 (SUTURE) ×2 IMPLANT
TOWEL OR NON WOVEN STRL DISP B (DISPOSABLE) ×6 IMPLANT
TRAY FOLEY W/METER SILVER 16FR (SET/KITS/TRAYS/PACK) ×3 IMPLANT
TROCAR BLADELESS OPT 5 100 (ENDOMECHANICALS) ×3 IMPLANT
TROCAR XCEL BLUNT TIP 100MML (ENDOMECHANICALS) IMPLANT
TROCAR XCEL NON-BLD 11X100MML (ENDOMECHANICALS) IMPLANT
TUBING INSUF HEATED (TUBING) ×3 IMPLANT

## 2016-09-17 NOTE — Transfer of Care (Signed)
Immediate Anesthesia Transfer of Care Note  Patient: Charles Santos  Procedure(s) Performed: Procedure(s): DIAGNOSTIC LAPAROSCOPY EXPLORATORY LAPAROTOMY SUBTOTAL COLECTOMY PROCTOSCOPY (N/A) PROCTOSCOPY  Patient Location: PACU  Anesthesia Type:General  Level of Consciousness: awake, alert , oriented and patient cooperative  Airway & Oxygen Therapy: Patient Spontanous Breathing and Patient connected to face mask oxygen  Post-op Assessment: Report given to RN, Post -op Vital signs reviewed and stable and Patient moving all extremities  Post vital signs: Reviewed and stable  Last Vitals:  Vitals:   09/17/16 0512  BP: (!) 132/91  Pulse: 78  Resp: 18  Temp: 37.2 C    Last Pain:  Vitals:   09/17/16 0535  TempSrc:   PainSc: 2       Patients Stated Pain Goal: 4 ([id number])  Complications: No apparent anesthesia complications

## 2016-09-17 NOTE — Anesthesia Procedure Notes (Signed)
Procedure Name: Intubation Date/Time: 09/17/2016 7:38 AM Performed by: Nilda Simmer Pre-anesthesia Checklist: Patient identified, Emergency Drugs available, Suction available, Patient being monitored and Timeout performed Patient Re-evaluated:Patient Re-evaluated prior to inductionOxygen Delivery Method: Circle system utilized Preoxygenation: Pre-oxygenation with 100% oxygen Intubation Type: IV induction Ventilation: Mask ventilation without difficulty and Oral airway inserted - appropriate to patient size Laryngoscope Size: Glidescope and 3 (first attempt with MAC 4, second attempt with glidescope #3) Grade View: Grade I Tube type: Oral Number of attempts: 3 Airway Equipment and Method: Stylet,  Oral airway and Video-laryngoscopy Placement Confirmation: ETT inserted through vocal cords under direct vision,  positive ETCO2 and breath sounds checked- equal and bilateral Secured at: 23 cm Tube secured with: Tape Dental Injury: Teeth and Oropharynx as per pre-operative assessment  Difficulty Due To: Difficulty was anticipated and Difficult Airway- due to reduced neck mobility Comments: First attempt at intubation by CRNA with MAC blade. Second attempt by MDA with MAC blade, grade III view. Third attempt with glidescope 3 with grade I view.

## 2016-09-17 NOTE — Anesthesia Postprocedure Evaluation (Signed)
Anesthesia Post Note  Patient: Lerin Chapple  Procedure(s) Performed: Procedure(s) (LRB): DIAGNOSTIC LAPAROSCOPY EXPLORATORY LAPAROTOMY SUBTOTAL COLECTOMY PROCTOSCOPY (N/A) PROCTOSCOPY  Patient location during evaluation: PACU Anesthesia Type: General Level of consciousness: awake and alert Pain management: pain level controlled Vital Signs Assessment: post-procedure vital signs reviewed and stable Respiratory status: spontaneous breathing, nonlabored ventilation and respiratory function stable Cardiovascular status: blood pressure returned to baseline and stable Postop Assessment: no signs of nausea or vomiting Anesthetic complications: no    Last Vitals:  Vitals:   09/17/16 1145 09/17/16 1200  BP: 119/64 117/69  Pulse: 68 70  Resp: 12 13  Temp:      Last Pain:  Vitals:   09/17/16 1200  TempSrc:   PainSc: 3                  Nilda Simmer

## 2016-09-17 NOTE — Interval H&P Note (Signed)
History and Physical Interval Note:  09/17/2016 6:43 AM  Charles Santos  has presented today for surgery, with the diagnosis of RECURRENT COLON CANCER  The various methods of treatment have been discussed with the patient and family. After consideration of risks, benefits and other options for treatment, the patient has consented to  Procedure(s): LAPAROSCOPIC ASSISTED SUBTOTAL COLECTOMY (N/A) as a surgical intervention .  The patient's history has been reviewed, patient examined, no change in status, stable for surgery.  I have reviewed the patient's chart and labs.  Questions were answered to the patient's satisfaction.     Adin Hector

## 2016-09-17 NOTE — Op Note (Signed)
Patient Name:           Charles Santos   Date of Surgery:        09/17/2016  Pre op Diagnosis:      Adenocarcinoma of the transverse colon                                       [name]                                       History right colectomy for cancer age 45  Post op Diagnosis:    Same plus extensive intra-abdominal adhesions  Procedure:                 Diagnostic laparoscopy, exploratory laparotomy, lysis of adhesions requiring 60 minutes, subtotal colectomy with ileoproctostomy  Surgeon:                     Edsel Petrin. Dalbert Batman, M.D., FACS  Assistant:                      Jens Som, M.D.   Indication for Assistant: Necessary for complex exposure, complex adhesio lysis, creation of anastomosis with stapler, to enhance safety and reduce complications  Operative Indications:    This is [name] who lives in [place]. He is referred by Dr. Owens Loffler because of a newly diagnosed cancer in the transverse colon. Significant past history of a right colectomy, presumably, in [place] at age 45 for colon cancer with postop chemotherapy. Presumably he has [name]. Dr. Laroy Apple is his PCP.       At age 44 he was having right lower quadrant pain and had CT scans and ultimately a colonoscopy and then underwent a colon resection for colon cancer from which he recovered. He had postop chemotherapy, and a note from [facility] in 2011 states that he had 5-FU and leucovorin according to old records. . He states that genetic testing was done at [facility] in 2011. \He moved to [place] in the year 2000. He says he gets colonoscopies every 2 or 3 years. There is a colonoscopy report from [facility] in 2002 which showed hepatic flexure anastomosis and no polyps. He underwent elective colonoscopy by Dr. [name] on August 23 and Dr. [name] found a non-circumferential ulcerated mass in the transverse colon and 2 polyps in the  sigmoid colon. He describes an anastomosis proximal to the cancer. He says the cancer was in the transverse colon. Pathology shows adenocarcinoma. 2 sigmoid polyp: Polyps were removed and showed villous adenoma. He was admitted to the hospital to a couple days later with post-polypectomy bleeding which stopped spontaneously.      He states that his biologic father had colon cancer. He doesn't know of any other family members. The patient has 2 sons. They were evaluated at [facility] in 2011. Reportedly the patient and one of his sons had the gene for colon cancer and the second son did not. The son with the gene for colon cancer had lots of medical problems and has died of pneumonia subsequently.     Patient's comorbidities are daily tobacco use.daily alcohol use 9-10 beers a day.  Dr. Laroy Apple is his PCP and is  trying to help him with substance dependence He's had a right inguinal hernia repair. Right colectomy.     He is separated. Lives with his 30 year old son. Drinks about 10 beers a day. Smokes about a pack of cigarettes per day. Lives in [place]. Works at a [employer] in [employer]. Fairly strenuous job. He denies alcohol withdrawal syndrome but hasn't stopped daily drinking a long time. Denies drinking in the morning. Was presented at GI case conference as soon as possible...Marland KitchenMarland Kitchenthis was done and the consensus surgical recommendation was subtotal colectomy to  proximal rectum. He will be scheduled for subtotal colectomy. We talked about the extent of surgery and I told him that we would resect from the prior anastomosis all the way down to the mid sigmoid and he was still be able to have bowel movements per rectum although this might be more frequent. He says he already has 3 or 4 bowel movements a day. This may be due to the beer intake. We will do this through his long midline incision.. CT enterography is negative for  other tumors. Upper endoscopy revealed a neoplastic mass in the second portion of the duodenum.  Biopsy shows adenoma.  Dr. [name] plans for referral After his colon surgery for endoscopic resection.  Dr. [name], Dr. Barry Dienes, and I have discussed this and agree with this plan.  Operative Findings:       The patient had extensive abdominal, sub-diaphragmatic, and pelvic adhesions.  This required at least 60 minutes to take down.  We started with a diagnostic laparoscopy but found very quickly that there were confluent adhesions and it would not be safe to proceed laparoscopically.  We therefore did this as an open procedure through a midline incision.  We can palpate a small tumor in the transverse colon near a tattoo marked that was about 2.5 cm in diameter.  There is no sign of any metastatic disease.  We created the anastomosis between the terminal ileum and the proximal rectum with the EEA stapling device.  The ileum would barely accommodate a 25 mm stapler and so the anastomosis was done with a 25 mm EEA stapler.  Following the anastomosis a leak test was done under water.  Insufflation was done under tension past the anastomosis and there were no air bubbles or leak.  The anastomosis looked good.  Good blood supply and no tension.  We were careful to make sure that it was not twisted.  Procedure in Detail:          Following the induction of general endotracheal anesthesia a Foley catheter was placed, intravenous antibiotics were given, and a surgical timeout was performed.  The patient was positioned in lithotomy position in padded stirrups and the abdomen and perineum were prepped and draped in a sterile fashion.     A 5 mg optical trocar was placed in the left subcostal region.  We were able to enter the peritoneal space without any trauma and insufflated the abdomen but we put the camera and we found that there were extensive omental adhesions extensively and Fleet covering the anterior bowel wall and  numerous adhesions between loops of small bowel.  We therefore abandon the laparoscopic approach.     Midline laparotomy incision was made both above and below the umbilicus.  The fascia was incised in the  midline.  We slowly entered the abdomen.  We slowly took down the omental adhesions and drop them away from the abdominal wall.  Self-retaining retractor was placed.  We spent 1 hour mobilizing the omentum up out of the pelvis and off of them numerous small bowel loops.  There were extensive adhesions in the right paracolic gutter and right upper quadrant.  We took down all the adhesions until we could run the small bowel from the ligament of Treitz to the anastomosis to the colon in the right upper quadrant.  We divided the terminal ileum with a GIA stapling device about 6 or 8 inches proximal to the ileocecal valve.  We then slowly took down the transverse colon mesentery with the Enseal device.  We mobilized the splenic flexure and divided the mesentery of the splenic flexure using the Enseal device.  We had one small capsular tear of the lower pole of the spleen which was controlled nicely with [facility] hemostatic sponge.  The descending colon and sigmoid colon were also mobilized.  There to visit their mesentery divided with the Enseal device.  We could feel the ureter in the left paracolic retroperitoneum and we stayed well away from this.  We transected the proximal rectum with a GIA stapling device and removed the specimen.  The specimen was sent to the lab and appropriate history attached.      We irrigated the abdomen and checked for hemostasis.  We created an anastomosis between the terminal ileum the proximal rectum in end-to-to side fashion, placed  the end of the rectum to the side of the terminal ileum.  This was done in a Baker technique by inserting the anvil of the stapler into the terminal ileum, bringing the spike of the anvil out through the antimesenteric wall, closing the terminal ileum  distal to the anvil with the GI stapling device.  Dr. Windle Guard passed the stapler from the rectum up to the end of the rectal closure.  We brought the spike of the stapler out through the anterior wall of the rectum.  We secured the terminal ileum onto the stapler.  We closed the stapler fired it opened it and removed it.  We had 2 good doughnuts of tissue.  Insufflation under air tension revealed no air bubbles.  Anastomosis looked good without tension and good blood supply.  There was no twisting of the mesentery.     We then did our standard changeover of instruments gowns and gloves and drapes.  The abdomen was copiously irrigated.  Sponge and needle counts were good.  We checked the  left upper quadrant, right paracolic gutter, left paracolic gutter and pelvis for bleeding and there was none.  We returned the small bowel to its anatomic position.  We placed 4 pieces of Seprafilm on top of the small bowel.  Midline fascia was closed with a running suture of #1 double-stranded PDS and several interrupted sutures of #1 Novafil.  The skin was closed loosely with staples and Telfa wicks between the staples.  Honeycomb bandage was placed and the patient taken to PACU in stable condition.  EBL 250 mL.  Counts correct.  Complications none.     Edsel Petrin. Dalbert Batman, M.D., FACS General and Minimally Invasive Surgery Breast and Colorectal Surgery  09/17/2016 10:54 AM

## 2016-09-18 LAB — BASIC METABOLIC PANEL
Anion gap: 7 (ref 5–15)
BUN: 9 mg/dL (ref 6–20)
CALCIUM: 8.4 mg/dL — AB (ref 8.9–10.3)
CO2: 31 mmol/L (ref 22–32)
CREATININE: 1.13 mg/dL (ref 0.61–1.24)
Chloride: 103 mmol/L (ref 101–111)
GFR calc Af Amer: >60 mL/min (ref >60)
GFR calc non Af Amer: >60 mL/min (ref >60)
GLUCOSE: 126 mg/dL — AB (ref 65–99)
Potassium: 4.4 mmol/L (ref 3.5–5.1)
Sodium: 141 mmol/L (ref 135–145)

## 2016-09-18 LAB — CBC
HCT: 47 % (ref 39.0–52.0)
Hemoglobin: 15.9 g/dL (ref 13.0–17.0)
MCH: 33.8 pg (ref 26.0–34.0)
MCHC: 33.8 g/dL (ref 30.0–36.0)
MCV: 99.8 fL (ref 78.0–100.0)
PLATELETS: 169 10*3/uL (ref 150–400)
RBC: 4.71 MIL/uL (ref 4.22–5.81)
RDW: 13.3 % (ref 11.5–15.5)
WBC: 10.1 10*3/uL (ref 4.0–10.5)

## 2016-09-18 MED ORDER — LORAZEPAM 2 MG/ML IJ SOLN
0.0000 mg | Freq: Four times a day (QID) | INTRAMUSCULAR | Status: AC
  Administered 2016-09-18 – 2016-09-19 (×3): 1 mg via INTRAVENOUS
  Filled 2016-09-18 (×2): qty 1

## 2016-09-18 MED ORDER — LORAZEPAM 2 MG/ML IJ SOLN
1.0000 mg | Freq: Four times a day (QID) | INTRAMUSCULAR | Status: AC | PRN
  Administered 2016-09-20: 1 mg via INTRAVENOUS
  Filled 2016-09-18: qty 1

## 2016-09-18 MED ORDER — LORAZEPAM 1 MG PO TABS: 1.0000 mg | ORAL_TABLET | Freq: Four times a day (QID) | ORAL | Status: AC | PRN

## 2016-09-18 MED ORDER — THIAMINE HCL 100 MG/ML IJ SOLN
100.0000 mg | Freq: Every day | INTRAMUSCULAR | Status: DC
  Administered 2016-09-18: 100 mg via INTRAVENOUS
  Filled 2016-09-18: qty 2

## 2016-09-18 MED ORDER — LORAZEPAM 2 MG/ML IJ SOLN
0.0000 mg | Freq: Two times a day (BID) | INTRAMUSCULAR | Status: DC
  Administered 2016-09-21: 2 mg via INTRAVENOUS
  Filled 2016-09-18 (×2): qty 1

## 2016-09-18 MED ORDER — VITAMIN B-1 100 MG PO TABS
100.0000 mg | ORAL_TABLET | Freq: Every day | ORAL | Status: DC
  Administered 2016-09-19 – 2016-09-22 (×4): 100 mg via ORAL
  Filled 2016-09-18 (×4): qty 1

## 2016-09-18 MED ORDER — FOLIC ACID 1 MG PO TABS
1.0000 mg | ORAL_TABLET | Freq: Every day | ORAL | Status: DC
  Administered 2016-09-18 – 2016-09-22 (×5): 1 mg via ORAL
  Filled 2016-09-18 (×5): qty 1

## 2016-09-18 MED ORDER — ADULT MULTIVITAMIN W/MINERALS CH
1.0000 | ORAL_TABLET | Freq: Every day | ORAL | Status: DC
  Administered 2016-09-18 – 2016-09-22 (×5): 1 via ORAL
  Filled 2016-09-18 (×5): qty 1

## 2016-09-18 NOTE — Progress Notes (Signed)
[place] [facility]:  [phone] General Surgery Progress Note   LOS: 1 day  POD -  1 Day Post-Op  Assessment/Plan: 1. DIAGNOSTIC LAPAROSCOPY,  EXPLORATORY LAPAROTOMY SUBTOTAL COLECTOMY PROCTOSCOPY,  PROCTOSCOPY  WBC - 09/18/2016 - 10,700  Fairly miserable with abdominal pain - but looks okay otherwise  2.  EtOH abuse  Started CIWA last PM.  Seems to be holding his own for right now. 3.  Smokes 4.  Foley   Plan to remove in AM 5.  DVT prophylaxis - Lovenox   Principal Problem:   Cancer of transverse colon ([place]) Active Problems:   Diarrhea   ETOH abuse   Tobacco abuse   Subjective:  Complains of lower abdominal pain, but seems within reason on first post op day.  Some shakiness last PM - his of 6 beers per day - started CIWA  Objective:   Vitals:   09/18/16 0413 09/18/16 0623  BP:  135/78  Pulse:  79  Resp: 14 16  Temp:  98.9 F (37.2 C)     Intake/Output from previous day:  10/06 0701 - 10/07 0700 In: 3209.2 [P.O.:180; I.V.:3029.2] Out: 2295 [Urine:1995; Blood:200]  Intake/Output this shift:  No intake/output data recorded.   Physical Exam:   General: WN WM who is alert and oriented.    HEENT: Normal. Pupils equal. .   Lungs: IS = 1,000.  Does fair.   Abdomen: Soft.  Quiet.   Wound: Okay   Lab Results:    Recent Labs  09/17/16 1438 09/18/16 0518  WBC 15.1* 10.1  HGB 17.3* 15.9  HCT 52.0 47.0  PLT 210 169    BMET   Recent Labs  09/17/16 1438 09/18/16 0518  NA  --  141  K  --  4.4  CL  --  103  CO2  --  31  GLUCOSE  --  126*  BUN  --  9  CREATININE 1.13 1.13  CALCIUM  --  8.4*    PT/INR  No results for input(s): LABPROT, INR in the last 72 hours.  ABG  No results for input(s): PHART, HCO3 in the last 72 hours.  Invalid input(s): PCO2, PO2   Studies/Results:  No results found.   Anti-infectives:   Anti-infectives    Start     Dose/Rate Route Frequency Ordered Stop   09/17/16 1930  cefoTEtan (CEFOTAN) 2 g in  dextrose 5 % 50 mL IVPB     2 g 100 mL/hr over 30 Minutes Intravenous Every 12 hours 09/17/16 1324 09/17/16 2027   09/17/16 0511  cefoTEtan (CEFOTAN) 2 g in dextrose 5 % 50 mL IVPB     2 g 100 mL/hr over 30 Minutes Intravenous On call to O.R. 09/17/16 0511 09/17/16 0730      Alphonsa Overall, MD, FACS Pager: [place] [facility]: [phone] 09/18/2016

## 2016-09-18 NOTE — Progress Notes (Signed)
Patient reports drinks 6 bottles of beer every night. Patient is experiencing shakiness,tremors and so anxious with everything. Notify on call provider and ordered to put patient on CIWA . We will continue to assess and monitor the patient.

## 2016-09-19 LAB — CBC
HEMATOCRIT: 43.7 % (ref 39.0–52.0)
HEMOGLOBIN: 14.2 g/dL (ref 13.0–17.0)
MCH: 33.6 pg (ref 26.0–34.0)
MCHC: 32.5 g/dL (ref 30.0–36.0)
MCV: 103.6 fL — AB (ref 78.0–100.0)
PLATELETS: 150 10*3/uL (ref 150–400)
RBC: 4.22 MIL/uL (ref 4.22–5.81)
RDW: 13.7 % (ref 11.5–15.5)
WBC: 10.6 10*3/uL — AB (ref 4.0–10.5)

## 2016-09-19 LAB — BASIC METABOLIC PANEL
ANION GAP: 6 (ref 5–15)
BUN: 7 mg/dL (ref 6–20)
CHLORIDE: 102 mmol/L (ref 101–111)
CO2: 28 mmol/L (ref 22–32)
Calcium: 8.7 mg/dL — ABNORMAL LOW (ref 8.9–10.3)
Creatinine, Ser: 0.84 mg/dL (ref 0.61–1.24)
GFR calc Af Amer: >60 mL/min (ref >60)
GLUCOSE: 101 mg/dL — AB (ref 65–99)
POTASSIUM: 4.3 mmol/L (ref 3.5–5.1)
Sodium: 136 mmol/L (ref 135–145)

## 2016-09-19 NOTE — Progress Notes (Signed)
[place] [facility]:  [phone] General Surgery Progress Note   LOS: 2 days  POD -  2 Days Post-Op  Assessment/Plan: 1. DIAGNOSTIC LAPAROSCOPY,  EXPLORATORY LAPAROTOMY SUBTOTAL COLECTOMY PROCTOSCOPY, PROCTOSCOPY - 09/17/2016 - [place]  WBC - 09/19/2016 - 10,600  Looks a lot better than yesterday.  Still no bowel function, will leave NPO  2.  EtOH abuse  Started CIWA on Friday, 10/6.   3.  Smokes 4.  Foley - out  Has already urinated 5.  DVT prophylaxis - Lovenox   Principal Problem:   Cancer of transverse colon ([place]) Active Problems:   Diarrhea   ETOH abuse   Tobacco abuse   Subjective:  Sore, but doing and moving better.  Understands plan.  History of 6 beers per day - started CIWA  Objective:   Vitals:   09/19/16 0529 09/19/16 0800  BP: 131/82   Pulse: 97   Resp: 18 14  Temp: 99.2 F (37.3 C)      Intake/Output from previous day:  10/07 0701 - 10/08 0700 In: 2927.1 [I.V.:2927.1] Out: 2250 [Urine:2250]  Intake/Output this shift:  Total I/O In: -  Out: 300 [Urine:300]   Physical Exam:   General: WN WM who is alert and oriented.  No withdrawal symptoms that I see.   HEENT: Normal. Pupils equal. .   Lungs: IS = 1,200.  Some rhonchi, but better.   Abdomen: Soft.  Quiet.   Wound: Okay   Lab Results:     Recent Labs  09/18/16 0518 09/19/16 0525  WBC 10.1 10.6*  HGB 15.9 14.2  HCT 47.0 43.7  PLT 169 150    BMET    Recent Labs  09/18/16 0518 09/19/16 0525  NA 141 136  K 4.4 4.3  CL 103 102  CO2 31 28  GLUCOSE 126* 101*  BUN 9 7  CREATININE 1.13 0.84  CALCIUM 8.4* 8.7*    PT/INR  No results for input(s): LABPROT, INR in the last 72 hours.  ABG  No results for input(s): PHART, HCO3 in the last 72 hours.  Invalid input(s): PCO2, PO2   Studies/Results:  No results found.   Anti-infectives:   Anti-infectives    Start     Dose/Rate Route Frequency Ordered Stop   09/17/16 1930  cefoTEtan (CEFOTAN) 2 g in dextrose 5 %  50 mL IVPB     2 g 100 mL/hr over 30 Minutes Intravenous Every 12 hours 09/17/16 1324 09/17/16 2027   09/17/16 0511  cefoTEtan (CEFOTAN) 2 g in dextrose 5 % 50 mL IVPB     2 g 100 mL/hr over 30 Minutes Intravenous On call to O.R. 09/17/16 0511 09/17/16 0730      Alphonsa Overall, MD, FACS Pager: [place] [facility]: [phone] 09/19/2016

## 2016-09-20 NOTE — Progress Notes (Signed)
3 Days Post-Op  Subjective: Stable and alert.  Pain control fairly good. States nicoderm patch has helped tobacco craving. On CIWA protocol for alcohol withdrawal, but he only takes Ativan about once every 24 hours.  Not a big problem.  He is well aware of this. Ambulating in hall Voiding without difficulty.  Urine output 100 mL per hour Denies nausea or vomiting.  No stool or flatus yet. Working hard on incentive spirometry.  VC 1750 mL.  Cough somewhat productive.  Objective: Vital signs in last 24 hours: Temp:  [98.5 F (36.9 C)-99.7 F (37.6 C)] 98.7 F (37.1 C) (10/09 0510) Pulse Rate:  [83-93] 83 (10/09 0510) Resp:  [14-22] 22 (10/09 0510) BP: (124-145)/(75-86) 133/84 (10/09 0510) SpO2:  [92 %-100 %] 92 % (10/09 0510) Last BM Date: 09/17/16  Intake/Output from previous day: 10/08 0701 - 10/09 0700 In: 3240 [P.O.:240; I.V.:3000] Out: 2200 [Urine:2200] Intake/Output this shift: No intake/output data recorded.    EXAM: General appearance: Alert.  Cooperative.  Oriented.  Mental status normal.  Mild distress from incisional pain Resp: clear to auscultation bilaterally.  VC 1750 cc. GI: Borderline distended.  Soft.  Appropriately tender.  Some bowel sounds heard.  Midline incision intact without signs of bleeding or infection.  Lab Results:  No results found for this or any previous visit (from the past 24 hour(s)).   Studies/Results: No results found.  Marland Kitchen alvimopan  12 mg Oral BID  . enoxaparin (LOVENOX) injection  40 mg Subcutaneous Q24H  . folic acid  1 mg Oral Daily  . HYDROmorphone   Intravenous Q4H  . LORazepam  0-4 mg Intravenous Q12H  . multivitamin with minerals  1 tablet Oral Daily  . nicotine  21 mg Transdermal Daily  . pneumococcal 23 valent vaccine  0.5 mL Intramuscular Tomorrow-1000  . thiamine  100 mg Oral Daily   Or  . thiamine  100 mg Intravenous Daily     Assessment/Plan: s/p Procedure(s): DIAGNOSTIC LAPAROSCOPY EXPLORATORY LAPAROTOMY  SUBTOTAL COLECTOMY PROCTOSCOPY PROCTOSCOPY   POD #3.  Extensive lysis of adhesions, subtotal colectomy, ileoproctostomy for cancer of transverse colon Stable Foley out and voiding well Expected ileus persists.  Nothing by mouth except ice chips and popsicles Continued frequent ambulation and incentive spirometry Change dressing today Check pathology  [name] History right colon cancer resected age 45 with postop chemotherapy EtOH abuse - CIWA- on Friday, October 6.  Well-controlled Tobacco abuse. Nickodermpatch seems to be effective DVT prophylaxis.  Lovenox    @PROBHOSP @  LOS: 3 days    Danie Hannig M 09/20/2016  . .prob

## 2016-09-21 MED ORDER — HYDROMORPHONE HCL 1 MG/ML IJ SOLN
1.0000 mg | INTRAMUSCULAR | Status: DC | PRN
  Administered 2016-09-21 – 2016-09-22 (×6): 1 mg via INTRAVENOUS
  Filled 2016-09-21 (×7): qty 1

## 2016-09-21 MED ORDER — DM-GUAIFENESIN ER 30-600 MG PO TB12
1.0000 | ORAL_TABLET | Freq: Two times a day (BID) | ORAL | Status: DC
  Administered 2016-09-21 – 2016-09-22 (×4): 1 via ORAL
  Filled 2016-09-21 (×4): qty 1

## 2016-09-21 NOTE — Progress Notes (Signed)
4 Days Post-Op  Subjective:  Stable and alert. Had 2 loose bowel movements without blood.  Gets a little crampy at times.  No nausea or vomiting Biggest problem is productive cough. Ambulating frequently and doing well with that.  Voiding without difficulty.  Still on CIWA protocol for alcohol withdrawal.  Takes Ativan about twice a day.  Doing well  Pathology shows 2 lesions.  At the old ileocolic anastomosis there is a villous adenoma with high-grade dysplasia.  In the transverse colon there is an adenocarcinoma which extends through the wall.  Nodes are negative.  Stage T3, N0.  I discussed this with him.  Advised medical oncology consultation as outpatient, and he understands.  Objective: Vital signs in last 24 hours: Temp:  [98.4 F (36.9 C)-99.8 F (37.7 C)] 98.5 F (36.9 C) (10/10 0529) Pulse Rate:  [79-87] 84 (10/10 0529) Resp:  [12-24] 15 (10/10 0529) BP: (116-145)/(65-86) 116/82 (10/10 0529) SpO2:  [93 %-99 %] 97 % (10/10 0529) Last BM Date: 09/20/16  Intake/Output from previous day: 10/09 0701 - 10/10 0700 In: 2500 [I.V.:2500] Out: 1575 [Urine:1575] Intake/Output this shift: Total I/O In: 1000 [I.V.:1000] Out: 0     EXAM: General appearance: Alert.  Cooperative.  Not agitated.  Not tremulous.  Skin warm and dry.  Coughing some. Lungs: Clear anteriorly.  Moving air well at bases.  A little coarse at bases but no egophony, wheezes or rhonchi.  Vital capacity excellent. Abdomen: Soft.  Slightly protuberant.  Not tympanitic.  Hypoactive bowel sounds.  Midline wound clean.  Lab Results:  No results found for this or any previous visit (from the past 24 hour(s)).   Studies/Results: No results found.  Marland Kitchen dextromethorphan-guaiFENesin  1 tablet Oral BID  . enoxaparin (LOVENOX) injection  40 mg Subcutaneous Q24H  . folic acid  1 mg Oral Daily  . LORazepam  0-4 mg Intravenous Q12H  . multivitamin with minerals  1 tablet Oral Daily  . nicotine  21 mg Transdermal  Daily  . pneumococcal 23 valent vaccine  0.5 mL Intramuscular Tomorrow-1000  . thiamine  100 mg Oral Daily   Or  . thiamine  100 mg Intravenous Daily     Assessment/Plan: s/p Procedure(s): DIAGNOSTIC LAPAROSCOPY EXPLORATORY LAPAROTOMY SUBTOTAL COLECTOMY PROCTOSCOPY PROCTOSCOPY   POD #4.   Extensive lysis of adhesions, subtotal colectomy, ileoproctostomy for cancer of transverse colon Stable Ileus resolving.  Allow liquid diet. Continued frequent ambulation and incentive spirometry Add expectorant to regimen to help mobilize secretions.  Long history smoking Pathology reveals T3 N0 cancer in transverse colon and villous adenoma with high-grade dysplasia at old anastomosis.  Both completely resected.  Plan medical oncology consultation as outpatient.  [name] History right colon cancer resected age 45 with postop chemotherapy  EtOH abuse - CIWA- on Friday, October 6.  Well-controlled Tobacco abuse. Charles Santos seems to be effective Mucinex DM DVT prophylaxis.  Lovenox   @PROBHOSP @  LOS: 4 days    Charles Santos M 09/21/2016  . .prob

## 2016-09-22 LAB — CBC
HCT: 41.9 % (ref 39.0–52.0)
Hemoglobin: 14.1 g/dL (ref 13.0–17.0)
MCH: 33.5 pg (ref 26.0–34.0)
MCHC: 33.7 g/dL (ref 30.0–36.0)
MCV: 99.5 fL (ref 78.0–100.0)
PLATELETS: 194 10*3/uL (ref 150–400)
RBC: 4.21 MIL/uL — ABNORMAL LOW (ref 4.22–5.81)
RDW: 13 % (ref 11.5–15.5)
WBC: 6.2 10*3/uL (ref 4.0–10.5)

## 2016-09-22 LAB — BASIC METABOLIC PANEL
Anion gap: 5 (ref 5–15)
BUN: 8 mg/dL (ref 6–20)
CALCIUM: 8.7 mg/dL — AB (ref 8.9–10.3)
CO2: 30 mmol/L (ref 22–32)
CREATININE: 0.91 mg/dL (ref 0.61–1.24)
Chloride: 102 mmol/L (ref 101–111)
GFR calc Af Amer: >60 mL/min (ref >60)
GLUCOSE: 111 mg/dL — AB (ref 65–99)
Potassium: 4.6 mmol/L (ref 3.5–5.1)
Sodium: 137 mmol/L (ref 135–145)

## 2016-09-22 MED ORDER — LORAZEPAM 1 MG PO TABS
1.0000 mg | ORAL_TABLET | Freq: Four times a day (QID) | ORAL | Status: DC | PRN
  Administered 2016-09-22: 1 mg via ORAL
  Filled 2016-09-22: qty 1

## 2016-09-22 MED ORDER — CALCIUM POLYCARBOPHIL 625 MG PO TABS
625.0000 mg | ORAL_TABLET | Freq: Two times a day (BID) | ORAL | Status: DC
  Administered 2016-09-22 (×2): 625 mg via ORAL
  Filled 2016-09-22 (×3): qty 1

## 2016-09-22 MED ORDER — PRO-STAT SUGAR FREE PO LIQD
30.0000 mL | Freq: Three times a day (TID) | ORAL | Status: DC
  Administered 2016-09-22 (×3): 30 mL via ORAL
  Filled 2016-09-22 (×3): qty 30

## 2016-09-22 NOTE — Progress Notes (Signed)
5 Days Post-Op  Subjective: Stable and alert.  Feeling better.  Good pain control with Percocet 5 stools Tolerating full liquids without nausea Still a little crampy when he ambulates  IV out and wants to leave it out Took Ativan once in last 24 hours   Objective: Vital signs in last 24 hours: Temp:  [98.4 F (36.9 C)-99.1 F (37.3 C)] 98.4 F (36.9 C) (10/11 0533) Pulse Rate:  [76-95] 76 (10/11 0533) Resp:  [15-17] 16 (10/11 0533) BP: (106-143)/(63-84) 106/63 (10/11 0533) SpO2:  [95 %-100 %] 95 % (10/11 0533) Last BM Date: 09/21/16  Intake/Output from previous day: 10/10 0701 - 10/11 0700 In: 2803.3 [P.O.:600; I.V.:2203.3] Out: -  Intake/Output this shift: No intake/output data recorded.  General appearance: Alert.  Looks more comfortable and relaxed today.  Smiling.  No distress. Resp: clear to auscultation bilaterally GI: Soft.  Hypoactive bowel sounds.  Incision clean and dry.  Appropriately tender.  Lab Results:  Results for orders placed or performed during the hospital encounter of 09/17/16 (from the past 24 hour(s))  CBC     Status: Abnormal   Collection Time: 09/22/16  5:17 AM  Result Value Ref Range   WBC 6.2 4.0 - 10.5 K/uL   RBC 4.21 (L) 4.22 - 5.81 MIL/uL   Hemoglobin 14.1 13.0 - 17.0 g/dL   HCT 41.9 39.0 - 52.0 %   MCV 99.5 78.0 - 100.0 fL   MCH 33.5 26.0 - 34.0 pg   MCHC 33.7 30.0 - 36.0 g/dL   RDW 13.0 11.5 - 15.5 %   Platelets 194 150 - 400 K/uL  Basic metabolic panel     Status: Abnormal   Collection Time: 09/22/16  5:17 AM  Result Value Ref Range   Sodium 137 135 - 145 mmol/L   Potassium 4.6 3.5 - 5.1 mmol/L   Chloride 102 101 - 111 mmol/L   CO2 30 22 - 32 mmol/L   Glucose, Bld 111 (H) 65 - 99 mg/dL   BUN 8 6 - 20 mg/dL   Creatinine, Ser 0.91 0.61 - 1.24 mg/dL   Calcium 8.7 (L) 8.9 - 10.3 mg/dL   GFR calc non Af Amer >60 >60 mL/min   GFR calc Af Amer >60 >60 mL/min   Anion gap 5 5 - 15     Studies/Results: No results found.  Marland Kitchen  dextromethorphan-guaiFENesin  1 tablet Oral BID  . enoxaparin (LOVENOX) injection  40 mg Subcutaneous Q24H  . folic acid  1 mg Oral Daily  . multivitamin with minerals  1 tablet Oral Daily  . nicotine  21 mg Transdermal Daily  . pneumococcal 23 valent vaccine  0.5 mL Intramuscular Tomorrow-1000  . thiamine  100 mg Oral Daily   Or  . thiamine  100 mg Intravenous Daily     Assessment/Plan: s/p Procedure(s): DIAGNOSTIC LAPAROSCOPY EXPLORATORY LAPAROTOMY SUBTOTAL COLECTOMY PROCTOSCOPY PROCTOSCOPY  POD #5.  Extensive lysis of adhesions, subtotal colectomy, ileoproctostomy for cancer of transverse colon Stable GI function normalizing Advance to soft diet Leave IV out Percocet for pain FiberCon tablet twice a day Diet and nutrition consult Continued frequent ambulation and incentive spirometry Add expectorant to regimen to help mobilize secretions.  Long history smoking.  This is helping Pathology reveals T3 N0 cancer in transverse colon and villous adenoma with high-grade dysplasia at old anastomosis.  Both completely resected.  Plan medical oncology consultation as outpatient. Possibly home tomorrow  [name] History right colon cancer resected age 14 with postop chemotherapy  EtOH  abuse - discontinue CIWA, but allow Ativan when necessary orally  Well-controlled Tobacco abuse. Nickodermpatch seems to be effective Mucinex DM DVT prophylaxis. Lovenox  Advised referral to PCP for alcohol and tobacco substance dependence program  @PROBHOSP @  LOS: 5 days    Bryer Gottsch M 09/22/2016  . .prob

## 2016-09-22 NOTE — Progress Notes (Signed)
Initial Nutrition Assessment  INTERVENTION:   -Provide Prostat liquid protein PO 30 ml TID with meals, each supplement provides 100 kcal, 15 grams protein. -Provided "Diarrhea Nutrition Therapy" handout from [employer]. -Provide education for s/p colectomy. Reviewed strategies to bulk stool. Emphasized the importance of adequate fluid and protein intake for hydration and healing. -Charles Santos to continue to monitor for additional needs  NUTRITION DIAGNOSIS:   Increased nutrient needs related to other (see comment) (s/p colectomy, Hx of ETOH abuse) as evidenced by estimated needs.  GOAL:   Patient will meet greater than or equal to 90% of their needs  MONITOR:   PO intake, Supplement acceptance, Labs, Weight trends, I & O's  REASON FOR ASSESSMENT:   Consult Diet education  ASSESSMENT:   45 year old male who presents with a complaint of recurrent colon cancer, transverse colon. This is [name] who lives in [place].  He is referred by Dr. Owens Loffler because of a newly diagnosed cancer in the transverse colon.  Significant past history of a right colectomy, presumably, in [place] at age 58 for colon cancer with postop chemotherapy.  Presumably he has [name].  Dr. Laroy Apple is his PCP.  Patient in room with no family at bedside. Pt c/o pain. States he did eat some broth, jello and popsicle for breakfast this morning. His diet has been advanced to soft diet but he does not feel ready to eat solid foods d/t bloating and gas. Pt states that PTA he was only consuming 1 meal a day. He states he has always drank a lot of fluids as his job requires him to work in the heat.  Pt patient, he has lost 18 lb and his UBW is 209 lb. Per chart review, pt's weight has remained stable around 198-202 lb recently. Pt does admit to gaining weight the more he drinks alcohol. Patient would not like protein supplements that require him to drink a  whole bottle. Pt is willing to try Prostat supplements, Charles Santos to order.  Provide pt with "Diarrhea Nutrition Therapy" handout from [employer]. Reviewed strategies to bulk stool. Reviewed GI soft diet. Emphasized the importance of adequate fluid and protein intake for hydration and healing. Pt with increased needs d/t PMHx of ETOH abuse and colon cancer. Encouraged pt to consume small, frequent meals and to sip on fluids throughout the day.   Nutrition focused physical exam shows no sign of depletion of muscle mass or body fat.  Labs reviewed. Medications: Folic acid tablet daily, Multivitamin with minerals daily, Fibercon tablet BID, Thiamine tablet daily  Diet Order:  DIET SOFT Room service appropriate? Yes; Fluid consistency: Thin  Skin:  Wound (see comment) (10/6 abdominal incision)  Last BM:  10/10  Height:   Ht Readings from Last 1 Encounters:  09/17/16 5\' 9"  (1.753 m)    Weight:   Wt Readings from Last 1 Encounters:  09/17/16 198 lb (89.8 kg)    Ideal Body Weight:  72.7 kg  BMI:  Body mass index is 29.24 kg/m.  Estimated Nutritional Needs:   Kcal:  2200-2400  Protein:  105-115g  Fluid:  2.2-2.4L/day  EDUCATION NEEDS:   Education needs addressed (Provided colectomy diet education)  Charles Bibles, Charles Santos, Charles Santos, Charles Santos Pager: [phone] After Hours Pager: [phone]

## 2016-09-23 ENCOUNTER — Encounter (INDEPENDENT_AMBULATORY_CARE_PROVIDER_SITE_OTHER): Payer: Self-pay | Admitting: Physician Assistant

## 2016-09-23 MED ORDER — SODIUM CHLORIDE 0.9% FLUSH: 3.0000 mL | INTRAVENOUS | Status: DC | PRN

## 2016-09-23 MED ORDER — MAGIC MOUTHWASH
15.0000 mL | Freq: Four times a day (QID) | ORAL | Status: DC | PRN
  Filled 2016-09-23: qty 15

## 2016-09-23 MED ORDER — LACTATED RINGERS IV BOLUS (SEPSIS): 1000.0000 mL | Freq: Three times a day (TID) | INTRAVENOUS | Status: DC | PRN

## 2016-09-23 MED ORDER — NAPROXEN 500 MG PO TABS
500.0000 mg | ORAL_TABLET | Freq: Two times a day (BID) | ORAL | Status: DC
  Administered 2016-09-23: 500 mg via ORAL
  Filled 2016-09-23: qty 1

## 2016-09-23 MED ORDER — SODIUM CHLORIDE 0.9 % IV SOLN: 250.0000 mL | INTRAVENOUS | Status: DC | PRN

## 2016-09-23 MED ORDER — OXYCODONE HCL 10 MG PO TABS: 10.0000 mg | ORAL_TABLET | ORAL | 0 refills | Status: DC | PRN

## 2016-09-23 MED ORDER — METHOCARBAMOL 500 MG PO TABS
1000.0000 mg | ORAL_TABLET | Freq: Four times a day (QID) | ORAL | Status: DC | PRN
  Administered 2016-09-23 (×2): 1000 mg via ORAL
  Filled 2016-09-23 (×2): qty 2

## 2016-09-23 MED ORDER — LOPERAMIDE HCL 2 MG PO CAPS: 2.0000 mg | ORAL_CAPSULE | ORAL | Status: DC | PRN

## 2016-09-23 MED ORDER — OXYCODONE HCL 5 MG PO TABS: 5.0000 mg | ORAL_TABLET | ORAL | Status: DC | PRN

## 2016-09-23 MED ORDER — CITALOPRAM HYDROBROMIDE 20 MG PO TABS: 20.0000 mg | ORAL_TABLET | Freq: Every day | ORAL | Status: DC

## 2016-09-23 MED ORDER — SODIUM CHLORIDE 0.9% FLUSH
3.0000 mL | Freq: Two times a day (BID) | INTRAVENOUS | Status: DC
  Administered 2016-09-23: 3 mL via INTRAVENOUS

## 2016-09-23 MED ORDER — PRO-STAT SUGAR FREE PO LIQD: 30.0000 mL | Freq: Three times a day (TID) | ORAL | Status: DC

## 2016-09-23 MED ORDER — LIP MEDEX EX OINT
1.0000 "application " | TOPICAL_OINTMENT | Freq: Two times a day (BID) | CUTANEOUS | Status: DC
  Administered 2016-09-23: 1 via TOPICAL

## 2016-09-23 MED ORDER — OXYCODONE HCL 5 MG PO TABS
10.0000 mg | ORAL_TABLET | ORAL | Status: DC | PRN
  Administered 2016-09-23 (×2): 10 mg via ORAL
  Filled 2016-09-23 (×2): qty 2

## 2016-09-23 MED ORDER — ALUM & MAG HYDROXIDE-SIMETH 200-200-20 MG/5ML PO SUSP: 30.0000 mL | Freq: Four times a day (QID) | ORAL | Status: DC | PRN

## 2016-09-23 MED ORDER — MENTHOL 3 MG MT LOZG: 1.0000 | LOZENGE | OROMUCOSAL | Status: DC | PRN

## 2016-09-23 MED ORDER — NICOTINE 21 MG/24HR TD PT24: 21.0000 mg | MEDICATED_PATCH | Freq: Every day | TRANSDERMAL | 1 refills | Status: AC

## 2016-09-23 MED ORDER — ACETAMINOPHEN 500 MG PO TABS: 1000.0000 mg | ORAL_TABLET | Freq: Three times a day (TID) | ORAL | Status: DC

## 2016-09-23 MED ORDER — METHOCARBAMOL 1000 MG/10ML IJ SOLN
1000.0000 mg | Freq: Four times a day (QID) | INTRAMUSCULAR | Status: DC | PRN
  Filled 2016-09-23: qty 10

## 2016-09-23 MED ORDER — PHENOL 1.4 % MT LIQD
2.0000 | OROMUCOSAL | Status: DC | PRN
  Filled 2016-09-23: qty 177

## 2016-09-23 MED ORDER — CALCIUM POLYCARBOPHIL 625 MG PO TABS: 625.0000 mg | ORAL_TABLET | Freq: Two times a day (BID) | ORAL | 6 refills | Status: DC

## 2016-09-23 MED ORDER — HYDROMORPHONE HCL 1 MG/ML IJ SOLN
0.5000 mg | INTRAMUSCULAR | Status: DC | PRN
Start: 2016-09-23 — End: 2016-09-23

## 2016-09-23 MED ORDER — PRO-STAT SUGAR FREE PO LIQD: 30.0000 mL | Freq: Three times a day (TID) | ORAL | 1 refills | Status: AC

## 2016-09-23 MED ORDER — LOPERAMIDE HCL 2 MG PO CAPS: 2.0000 mg | ORAL_CAPSULE | ORAL | 6 refills | Status: DC | PRN

## 2016-09-23 NOTE — Progress Notes (Signed)
09/23/16  0750  Reviewed discharge instructions with patient. Patient verbalized understanding of discharge instructions. Copy of discharge instructions, prescriptions, and work note given to patient.

## 2016-09-23 NOTE — Discharge Summary (Addendum)
THIS IS A DISCHARGE SUMMARY    Patient ID: Charles Santos [id number] 45 y.o. 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                                         [name]                                         Tobacco abuse                                         Alcohol abuse                                         Family history of colon cancer                                         History of right colectomy for cancer age 79                                         History right angle hernia repair  Operations: Procedure(s): DIAGNOSTIC LAPAROSCOPY EXPLORATORY LAPAROTOMY SUBTOTAL COLECTOMY PROCTOSCOPY PROCTOSCOPY  Admission Condition: good  Discharged Condition: good  Indication for Admission: This is [name] who lives in [place]. He is referred by Dr. Owens Loffler because of a newly diagnosed cancer in the transverse colon. Significant past history of a right colectomy, presumably, in [place] at age 74 for colon cancer with postop chemotherapy. Presumably he has [name]. Dr. Laroy Apple is his PCP.       At age 66 he was having right lower quadrant pain and had CT scans and ultimately a colonoscopy and then underwent a colon resection for colon cancer from which he recovered. He had postop chemotherapy, and a note from [facility] in 2011 states that he had 5-FU and leucovorin according to old records. . He states that genetic testing was done at [facility] in 2011. He moved to [place] in the year 2000. He says he gets colonoscopies every 2 or 3 years. There is a  colonoscopy report from [facility] in 2002 which showed hepatic flexure anastomosis and no polyps. He underwent elective colonoscopy by Dr. [name] on August 23 and Dr. [name] found a non-circumferential ulcerated mass in the transverse colon and 2 polyps in the sigmoid colon. He describes an anastomosis proximal to the cancer. He says the cancer  was in the transverse colon. Pathology shows adenocarcinoma. 2 sigmoid polyp: Polyps were removed and showed villous adenoma. He was admitted to the hospital to a couple days later with post-polypectomy bleeding which stopped spontaneously.      He states that his biologic father had colon cancer. He doesn't know of any other family members. The patient has 2 sons. They were evaluated at [facility] in [facility]. Reportedly the patient and one of his sons had the gene for colon cancer and the second son did not. The son with the gene for colon cancer had lots of medical problems and has died of pneumonia subsequently.     Patient's comorbidities are daily tobacco use.daily alcohol use 9-10 beers a day.  Dr. Laroy Apple is his PCP and is trying to help him with substance dependence He's had a right inguinal hernia repair. Right colectomy.     He is separated. Lives with his 59 year old son. Drinks about 10 beers a day. Smokes about a pack of cigarettes per day. Lives in [place]. Works at a [employer] in [employer]. Fairly strenuous job. He denies alcohol withdrawal syndrome but hasn't stopped daily drinking a long time. Denies drinking in the morning. Was presented at GI case conference .Marland Kitchen...this was done and the consensus surgical recommendation was subtotal colectomy to  proximal rectum. He will be scheduled for subtotal colectomy. We talked about the extent of surgery and I told him that we would resect from the prior anastomosis all the way down to the mid sigmoid and he was still be able to have  bowel movements per rectum although this might be more frequent. He says he already has 3 or 4 bowel movements a day. This may be due to the beer intake. We will do this through his long midline incision.. CT enterography is negative for other tumors. Upper endoscopy revealed a neoplastic mass in the second portion of the duodenum. Biopsy shows adenoma. Dr. [name] plans for referral After his colon surgery for endoscopic resection. Dr. [name], Dr. Barry Dienes, and I have discussed this and agree with this plan.  Hospital Course: On the day of admission the patient was taken to the operating room.  Diagnostic laparoscopy revealed extensive, confluent adhesions throughout the upper abdomen lower abdomen and pelvis.  Procedure was converted to open laparotomy.  Required a one hour lysis of adhesions.  I was then able to identify the old anastomosis.  Transection of terminal ileum and proximal rectum with removal of specimen and anastomosis of terminal ileum to proximal rectum with 25 mm EEA stapler.      Final pathology revealed 2 lesions.  In the transverse colon there was an invasive adenocarcinoma, 1.5 cm diameter.  22 lymph nodes were negative.  Stage TIII N0.  At the old surgical anastomosis there was a tubulovillous adenoma with high-grade dysplasia.  All of this was resected with negative margins.  Patient was advised of his pathology and was advised that I will refer him to a medical oncologist as an outpatient.     Postoperatively the patient did reasonably well.  He had problems with pain.  He had problems with nervousness.  An tremulousness, although never really got agitated.  We placed him on alcohol withdrawal protocol and that worked very well.  He was given neck and her patches and that helped his nicotine craving quite well.  He became ambulatory quickly.  Foley was removed on postop day 2 and he had no  trouble voiding.  His pain came under control by the second or third day and began having  loose stools.  We slowly advanced his diet.  He began having frequent loose stools, as anticipated.  He was placed on FiberCon.  Dietitian was asked to see him and made excellent recommendations for diet.     He felt ready to go home on the day of discharge.  He was still sore.  Abdomen was soft and appropriately tender.  Midline wound looked clean.  We left the staples in place.  Lungs were clear    He was given a prescription for Imodium to use as needed, nicoderm patches, Fibercon tabs twice a day, oxycodone.  We gave him a prescription for pro-stat protein supplement to take 3 times a day with meals.  All this was discussed in detail by me and the dietitian.  Continue usual medications.     I advised him to return to see me in one week for staple removal and wound check     I advised him to see his primary care physician immediately for smoking cessation and alcohol cessation programs     Activities were discussed.  No lifting more than 15 pounds for 2 months.  No driving.     Hydration was emphasized.  I told him that the diarrhea could dehydrate him quickly if he did not keep up.  Hopefully the fiber and Imodium will help control his stools.  Consults: Dietitian and nutritional services  Significant Diagnostic Studies: Surgical pathology  Treatments: surgery: Subtotal colectomy  Disposition: Home  Patient Instructions:    Medication List    TAKE these medications   acetaminophen 650 MG CR tablet Commonly known as:  TYLENOL Take 650 mg by mouth every 8 (eight) hours as needed for pain.   citalopram 20 MG tablet Commonly known as:  CELEXA Take 1 tablet (20 mg total) by mouth daily.   feeding supplement (PRO-STAT SUGAR FREE 64) Liqd Take 30 mLs by mouth 3 (three) times daily with meals.   loperamide 2 MG capsule Commonly known as:  IMODIUM Take 1 capsule (2 mg total) by mouth as needed for diarrhea or loose stools.   nicotine 21 mg/24hr patch Commonly known as:  NICODERM CQ -  dosed in mg/24 hours Place 1 patch (21 mg total) onto the skin daily.   Oxycodone HCl 10 MG Tabs Take 1-1.5 tablets (10-15 mg total) by mouth every 4 (four) hours as needed for moderate pain, severe pain or breakthrough pain.   polycarbophil 625 MG tablet Commonly known as:  FIBERCON Take 1 tablet (625 mg total) by mouth 2 (two) times daily at 10 AM and 5 PM.   predniSONE 20 MG tablet Commonly known as:  DELTASONE Take 2 tablets (40 mg total) by mouth daily with breakfast.       Activity: Ambulate frequently.  May walk up stairs.  No driving for 2-3 weeks.  No lifting more than 15 pounds for 6-8 weeks.  No sports. Diet: Soft diet with protein supplements, multivitamins and forced hydration as discussed Wound Care: keep wound clean and dry  Follow-up:  With Dr. Dalbert Batman in 1 week.  Signed: Edsel Petrin. Dalbert Batman, M.D., FACS General and minimally invasive surgery Breast and Colorectal Surgery  09/23/2016, 7:15 AM

## 2016-09-23 NOTE — Discharge Instructions (Signed)
[facility]      [facility] [phone]  OPEN ABDOMINAL SURGERY: POST OP INSTRUCTIONS  Always review your discharge instruction sheet given to you by the facility where your surgery was performed.  IF YOU HAVE DISABILITY OR FAMILY LEAVE FORMS, YOU MUST BRING THEM TO THE OFFICE FOR PROCESSING.  PLEASE DO NOT GIVE THEM TO YOUR DOCTOR.  1. A prescription for pain medication may be given to you upon discharge.  Take your pain medication as prescribed, if needed.  If narcotic pain medicine is not needed, then you may take acetaminophen (Tylenol) or ibuprofen (Advil) as needed. 2. Take your usually prescribed medications unless otherwise directed. 3. If you need a refill on your pain medication, please contact your pharmacy. They will contact our office to request authorization.  Prescriptions will not be filled after 5pm or on week-ends. 4. You should follow a light diet the first few days after arrival home, such as soup and crackers, pudding, etc.unless your doctor has advised otherwise. A high-fiber, low fat diet can be resumed as tolerated.   Be sure to include lots of fluids daily. Most patients will experience some swelling and bruising on the chest and neck area.  Ice packs will help.  Swelling and bruising can take several days to resolve 5. Most patients will experience some swelling and bruising in the area of the incision. Ice pack will help. Swelling and bruising can take several days to resolve..  6. It is common to experience some constipation if taking pain medication after surgery.  Increasing fluid intake and taking a stool softener will usually help or prevent this problem from occurring.  A mild laxative (Milk of Magnesia or Miralax) should be taken according to package directions if there are no bowel movements after 48 hours. 7.  You may have steri-strips (small skin tapes) in place directly over the incision.  These strips should be left on the skin for 7-10 days.  If your  surgeon used skin glue on the incision, you may shower in 24 hours.  The glue will flake off over the next 2-3 weeks.  Any sutures or staples will be removed at the office during your follow-up visit. You may find that a light gauze bandage over your incision may keep your staples from being rubbed or pulled. You may shower and replace the bandage daily. 8. ACTIVITIES:  You may resume regular (light) daily activities beginning the next day--such as daily self-care, walking, climbing stairs--gradually increasing activities as tolerated.  You may have sexual intercourse when it is comfortable.  Refrain from any heavy lifting or straining until approved by your doctor. a. You may drive when you no longer are taking prescription pain medication, you can comfortably wear a seatbelt, and you can safely maneuver your car and apply brakes b. Return to Work: ___________________________________ 9. You should see your doctor in the office for a follow-up appointment approximately two weeks after your surgery.  Make sure that you call for this appointment within a day or two after you arrive home to insure a convenient appointment time. OTHER INSTRUCTIONS:  _____________________________________________________________ _____________________________________________________________  WHEN TO CALL YOUR DOCTOR: 1. Fever over 101.0 2. Inability to urinate 3. Nausea and/or vomiting 4. Extreme swelling or bruising 5. Continued bleeding from incision. 6. Increased pain, redness, or drainage from the incision. 7. Difficulty swallowing or breathing 8. Muscle cramping or spasms. 9. Numbness or tingling in hands or feet or around lips.  The clinic staff is available to   answer your questions during regular business hours.  Please don't hesitate to call and ask to speak to one of the nurses if you have concerns.  For further questions, please visit [email or web address]   

## 2016-09-24 ENCOUNTER — Telehealth: Payer: Self-pay

## 2016-09-24 NOTE — Telephone Encounter (Signed)
[name], MD  [name], MD  Cc: Barron Alvine, RN        Renelda Loma  Thanks, we'll check on the referral for duodenal adenoma endoscopic resection.   Ebin Palazzi,  Does he have a date for the [facility] Dr. Gayleen Orem consult yet (for duodenal adenoma)?

## 2016-09-24 NOTE — Telephone Encounter (Signed)
They called the pt on 09/17/16 and mailed a letter but have not gotten a response from him yet.  He is aware they are trying to reach him, he will call them to set up appt.

## 2016-09-28 NOTE — Telephone Encounter (Signed)
Great, thanks for the follow up. 

## 2016-09-28 NOTE — Telephone Encounter (Signed)
I spoke to the pt and he states he has an appt this Thursday with Dr Okey Dupre.

## 2016-09-29 ENCOUNTER — Encounter: Payer: Self-pay | Admitting: *Deleted

## 2016-09-30 ENCOUNTER — Telehealth: Payer: Self-pay | Admitting: *Deleted

## 2016-09-30 ENCOUNTER — Telehealth: Payer: Self-pay | Admitting: Gastroenterology

## 2016-09-30 NOTE — Telephone Encounter (Signed)
Oncology Nurse Navigator Documentation  Oncology Nurse Navigator Flowsheets 09/30/2016  Navigator Location [facility]  Referral date to RadOnc/MedOnc 09/29/2016  Navigator Encounter Type Telephone  Telephone Outgoing Call  Referral from Dr. Dalbert Batman for Dr. Benay Spice. Called home # and left VM with navigator direct # to call back to schedule new patient appointment.

## 2016-09-30 NOTE — Telephone Encounter (Signed)
Rec'd from [facility] forward 5 pages to Dr. [name]

## 2016-09-30 NOTE — Telephone Encounter (Signed)
Oncology Nurse Navigator Documentation  Oncology Nurse Navigator Flowsheets 09/30/2016 09/30/2016  Navigator Location [facility]  Referral date to RadOnc/MedOnc 09/29/2016 -  Navigator Encounter Type Telephone Introductory phone call  Telephone Outgoing Call Appt Confirmation/Clarification  Abnormal Finding Date - 08/04/2016  Confirmed Diagnosis Date - 08/04/2016  Surgery Date - 09/17/2016  Spoke with patient and provided new patient appointment for 10/18/16 at 2 pm with Dr. Benay Spice. Informed of location of [place], valet service, and registration process. Reminded to bring photo ID,  insurance cards and a current medication list, including supplements. Patient verbalizes understanding. He has contact # for navigator to call with any questions or need to change appointment. HIM notified to schedule in EPIC. In basket message to Dr. Eugenia Pancoast office to inquire if they have received his genetic testing results from [facility].

## 2016-10-01 ENCOUNTER — Telehealth: Payer: Self-pay | Admitting: Gastroenterology

## 2016-10-01 NOTE — Telephone Encounter (Signed)
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

## 2016-10-04 ENCOUNTER — Encounter: Payer: Self-pay | Admitting: Family Medicine

## 2016-10-04 ENCOUNTER — Ambulatory Visit (INDEPENDENT_AMBULATORY_CARE_PROVIDER_SITE_OTHER): Payer: BLUE CROSS/BLUE SHIELD | Admitting: Family Medicine

## 2016-10-04 VITALS — BP 114/79 | HR 101 | Temp 98.3°F | Ht 69.0 in | Wt 176.2 lb

## 2016-10-04 DIAGNOSIS — F1011 Alcohol abuse, in remission: Secondary | ICD-10-CM | POA: Diagnosis not present

## 2016-10-04 DIAGNOSIS — Z720 Tobacco use: Secondary | ICD-10-CM | POA: Diagnosis not present

## 2016-10-04 DIAGNOSIS — C184 Malignant neoplasm of transverse colon: Secondary | ICD-10-CM | POA: Diagnosis not present

## 2016-10-04 DIAGNOSIS — K591 Functional diarrhea: Secondary | ICD-10-CM

## 2016-10-04 NOTE — Patient Instructions (Addendum)
Great to see you!  Lets follow up in 2-3 months unless you need me sooner

## 2016-10-04 NOTE — Progress Notes (Signed)
   HPI  Patient presents today after partial colectomy for colon cancer.  Patient's healing well, he states that he is tolerating foods and fluids better, however he does have associated bilateral abdominal cramping and frequent stooling after eating.  He has had some episodes of emesis but overall is tolerating foods and fluids much better.  Incisions are healing well.  He has no complaints other than cramping abdominal pain and difficulty tolerating foods and fluids.  He states he has completely stopped drinking alcohol since his admission. He's cut back on his cigarettes quite a bit.  PMH: Smoking status noted ROS: Per HPI  Objective: BP 114/79   Pulse (!) 101   Temp 98.3 F (36.8 C) (Oral)   Ht 5\' 9"  (1.753 m)   Wt 176 lb 3.2 oz (79.9 kg)   BMI 26.02 kg/m  Gen: NAD, alert, cooperative with exam HEENT: NCAT CV: RRR, good S1/S2, no murmur Resp: CTABL, no wheezes, non-labored Abd: Soft, positive bowel sounds, healing midline incision covered with Steri-Strips without any areas of fluctuance or drainage Ext: No edema, warm Neuro: Alert and oriented, No gross deficits  Assessment and plan:  # Transverse colon cancer, Diarrhea Functional diarrhea after partial colectomy, this was expected Status post surgical removal He has close follow-up with his surgeon in 3 days I have declined his asked for pain medications at this time so that he can have good follow-up with his surgeon and received pain meds from only one physician.   # Alcohol abuse in remission Patient has completely stopped drinking alcohol, congratulated today and encouraged to continue cessation  # Tobacco abuse Cutting back, interested in quitting but not ready to     Laroy Apple, MD [facility] 10/04/2016, 11:46 AM

## 2016-10-18 ENCOUNTER — Ambulatory Visit (HOSPITAL_BASED_OUTPATIENT_CLINIC_OR_DEPARTMENT_OTHER): Payer: BLUE CROSS/BLUE SHIELD | Admitting: Oncology

## 2016-10-18 ENCOUNTER — Encounter: Payer: Self-pay | Admitting: *Deleted

## 2016-10-18 ENCOUNTER — Ambulatory Visit (HOSPITAL_BASED_OUTPATIENT_CLINIC_OR_DEPARTMENT_OTHER): Payer: BLUE CROSS/BLUE SHIELD

## 2016-10-18 ENCOUNTER — Telehealth: Payer: Self-pay

## 2016-10-18 ENCOUNTER — Telehealth: Payer: Self-pay | Admitting: Oncology

## 2016-10-18 VITALS — BP 126/80 | HR 84 | Temp 98.8°F | Resp 17 | Ht 69.0 in | Wt 179.3 lb

## 2016-10-18 DIAGNOSIS — Z85038 Personal history of other malignant neoplasm of large intestine: Secondary | ICD-10-CM

## 2016-10-18 DIAGNOSIS — C184 Malignant neoplasm of transverse colon: Secondary | ICD-10-CM

## 2016-10-18 DIAGNOSIS — F329 Major depressive disorder, single episode, unspecified: Secondary | ICD-10-CM | POA: Diagnosis not present

## 2016-10-18 DIAGNOSIS — F419 Anxiety disorder, unspecified: Secondary | ICD-10-CM

## 2016-10-18 DIAGNOSIS — Z720 Tobacco use: Secondary | ICD-10-CM | POA: Diagnosis not present

## 2016-10-18 NOTE — Progress Notes (Signed)
Oncology Nurse Navigator Documentation  Oncology Nurse Navigator Flowsheets 10/18/2016  Navigator Location [facility]  Referral date to RadOnc/MedOnc -  Navigator Encounter Type Initial MedOnc  Telephone -  Abnormal Finding Date -  Confirmed Diagnosis Date -  Surgery Date -  Patient Visit Type MedOnc;Initial  Barriers/Navigation Needs Coordination of Care  Interventions Coordination of Care--message to Dr. Eugenia Pancoast nurse to inquire for patient on status of referral to Dr. Gayleen Orem at [facility]. Patient asking to be seen before he returns to work on 11/22.  Support Groups/Services GI Support Group  Acuity Level 1  Time Spent with Patient 60  Met with patient during new patient visit. Explained the role of the GI Nurse Navigator and provided New Patient Packet with information on: 1. Colon cancer--discussed CEA level and significance. Also provided smoking cessation information for him. 2. Support groups 3. Advanced Directives 4. Fall Safety Plan Answered questions, reviewed current treatment plan using TEACH back and provided emotional support. Provided copy of current treatment plan. Escorted him to scheduler and then to lab. He will return in 6 months with CEA and urine cytology. See *patient instructions*  Merceda Elks, RN, BSN GI Oncology [place]

## 2016-10-18 NOTE — Telephone Encounter (Signed)
GAVE PATIENT AVS REPORT AND APPOINTMENTS FOR MAY °

## 2016-10-18 NOTE — Telephone Encounter (Signed)
-----   Message from Tania Ade, RN sent at 10/18/2016  3:59 PM EST ----- Regarding: Referral to [facility], Have you heard anything about his referral to Dr. Lysle Rubens at [facility]? He is wanting to be seen before he returns to work on 11/22 if possible. He saw Dr. Benay Spice today and had CEA drawn. Will see Dr. Benay Spice again in 6 months-no need for adjuvant chemo.  Manuela Schwartz

## 2016-10-18 NOTE — Patient Instructions (Addendum)
You have a stage II colon cancer. Based on pathology, no further chemotherapy is needed Based on having [name], you are at a higher risk for a small bowel, gastric, pancreas and bladder cancer Follow Dr. Eugenia Pancoast recommendations for colonoscopy every year and endoscopy every 2 years Follow up with Dr. Gayleen Orem at [facility] will assist in this referral Check CEA level today and in 6 months. Will check urine cytology in 6 months.

## 2016-10-18 NOTE — Progress Notes (Signed)
[place] New Patient Consult   Referring MD: Antario Yasuda 45 y.o.  [birth year]    Reason for Referral: Colon cancer, HNPCC   HPI: Mr. Walkowiak has a history of hereditary non-polyposis colon cancer syndrome. He saw Dr. [name] for a screening upper and lower endoscopy 09/07/2016. The esophagus appeared normal. A 2 cm mucosal lesion was found in the duodenum 2 cm from the major papilla. Biopsies were obtained. A colonoscopy was performed 08/04/2016. An ulcerated 3 Center mass was found in the mid transverse colon. The area was biopsied and tattooed. 2 polyps were removed from the sigmoid colon.  The pathology from the colonoscopy on 08/04/2016 revealed adenocarcinoma at the transverse colon. The polyps from the sigmoid colon returned as a tubulovillous adenoma. The biopsies from the duodenum on 09/07/2016 revealed fragments of a small bowel adenoma. No high-grade dysplasia or malignancy was identified. CT of the stomach revealed chronic inactive gastritis.  Mr. Shadowens was diagnosed with colon cancer at age 75 a living in [place]. We do not have records from [place] available today. He was treated with adjuvant chemotherapy. A note from [facility] in 2011 indicates he received 5-FU and leucovorin.  He underwent genetic testing at [facility] in 2013. A deleterious mutation was found in the [facility] 2 gene   He was referred for staging CTs of the chest, abdomen, and pelvis on 08/08/2016. No evidence of metastatic disease. Normal appearance of the abdomen and pelvis status post a right cecal resection. A CT and enterography on 09/10/2016 revealed no small bowel thickening or mass. No evidence of metastatic disease.  Mr. Tetrault was referred to Dr. Dalbert Batman. He was taken to the operating room on 09/17/2016 for a laparoscopy, lysis of adhesions, subtotal colectomy with ileoproctostomy. There were extensive adhesions. A tumor was palpated in the transverse colon near a  tattoo mark. No evidence of metastatic disease. An anastomosis was created between the terminal ileum and proximal rectum.  The pathology ([id number]) confirmed an invasive moderately differentiated adenocarcinoma extending into pericolonic connective tissue. The resection margins were negative. 22 benign lymph nodes. A tubulovillous adenoma was noted at the previous surgical anastomosis with high-grade dysplasia. No lymphovascular or perineural invasion. No macroscopic tumor perforation. The tumor returned MSI-stable. There is loss of MSH2 expression.  He reports feeling well prior to surgery. He has developed more frequent bowel movements following surgery.  Past Medical History:  Diagnosis Date  . Anxiety   . Cancer of transverse colon (HCC)-stage II (T3 N0)  09/17/2016  . Colon cancer ([place]) 1998  . Depression   . Emphysema of lung ([place])   . Hereditary non-polyposis colon cancer syndrome-MSH2 mutation    . GERD (gastroesophageal reflux disease)   . Hyperlipidemia     Past Surgical History:  Procedure Laterality Date  . APPENDECTOMY    . Colon Resection   1998     . COLONOSCOPY    . INGUINAL HERNIA REPAIR    . LAPAROSCOPIC SUBTOTAL COLECTOMY N/A 09/17/2016   Procedure: DIAGNOSTIC LAPAROSCOPY EXPLORATORY LAPAROTOMY SUBTOTAL COLECTOMY PROCTOSCOPY;  Surgeon: [name], MD;  Location: [facility];  Service: General;  Laterality: N/A;  . PROCTOSCOPY  09/17/2016   Procedure: PROCTOSCOPY;  Surgeon: [name], MD;  Location: [facility];  Service: General;;    Medications: Reviewed  Allergies: No Known Allergies  Family history: His father had colon cancer. He reports a deceased son had hereditary non-polyposis colon cancer syndrome and died of complications related to cerebral palsy.  Another son has asked negative for HNPCC. His daughter has not been tested.  Social History:   He lives with his son in [place]. He works in a [employer]. He smokes cigarettes and drinks alcohol in the  evening. No transfusion history. No risk factor for HIV or hepatitis.  History  Alcohol Use  . 40.8 oz/week  . 56 Standard drinks or equivalent, 12 Cans of beer per week    Comment: occ    History  Smoking Status  . Current Every Day Smoker  . Packs/day: 1.25  . Years: 23.00  . Types: Cigarettes  Smokeless Tobacco  . Never Used      ROS:   Positives include:15 pound weight loss following the subtotal colectomy, abdominal pain following surgery, increased stool frequency following surgery  A complete ROS was otherwise negative.  Physical Exam:  Blood pressure 126/80, pulse 84, temperature 98.8 F (37.1 C), temperature source Oral, resp. rate 17, height 5' 9"  (1.753 m), weight 179 lb 4.8 oz (81.3 kg), SpO2 100 %.  HEENT: Oropharynx without visible mass, lower denture plate, neck without mass Lungs: Mild inspiratory and expiratory wheezes at the upper posterior chest bilaterally, no respiratory distress Cardiac: Regular rate and rhythm Abdomen: No hepatosplenomegaly, mild diffuse tenderness, no mass GU: Testes without mass  Vascular: No leg edema Lymph nodes: No cervical, supraclavicular, axillary, or inguinal nodes Neurologic: Alert and oriented, the motor exam appears intact in the upper and lower extremities Skin: Erythematous rash over the nose and malar areas Musculoskeletal: No spine tenderness   LAB:  CBC  Lab Results  Component Value Date   WBC 6.2 09/22/2016   HGB 14.1 09/22/2016   HCT 41.9 09/22/2016   MCV 99.5 09/22/2016   PLT 194 09/22/2016   NEUTROABS 4.7 09/06/2016     CMP      Component Value Date/Time   NA 137 09/22/2016 0517   NA 138 10/16/2015 1037   K 4.6 09/22/2016 0517   CL 102 09/22/2016 0517   CO2 30 09/22/2016 0517   GLUCOSE 111 (H) 09/22/2016 0517   BUN 8 09/22/2016 0517   BUN 17 10/16/2015 1037   CREATININE 0.91 09/22/2016 0517   CALCIUM 8.7 (L) 09/22/2016 0517   PROT 6.9 09/06/2016 1330   PROT 6.7 10/16/2015 1037    ALBUMIN 4.4 09/06/2016 1330   ALBUMIN 4.4 10/16/2015 1037   AST 31 09/06/2016 1330   ALT 30 09/06/2016 1330   ALKPHOS 70 09/06/2016 1330   BILITOT 1.1 09/06/2016 1330   BILITOT 0.7 10/16/2015 1037   GFRNONAA >60 09/22/2016 0517   GFRAA >60 09/22/2016 0517   CEA on 09/06/2016-10.2   Imaging:  As per history of present illness   Assessment/Plan:   1. Adenocarcinoma the transverse colon, stage II (T3 N0), status post subtotal colectomy 09/17/2016  2. History of right-sided colon cancer at age 59-status post a right colectomy and adjuvant 5-FU/leucovorin  3.   Hereditary non-polyposis colon cancer syndrome-MSH2 mutation  4.   Erythematous rash of the face-rosacea?  5.   Family history of colon cancer  6.   Tobacco use  7.   Anxiety/depression  8.   Duodenal adenoma confirmed on upper endoscopy 09/07/2016-referred to [facility] for removal   Disposition:   Mr. Bissonnette has hereditary non-polyposis colon cancer syndrome. He has been diagnosed with stage II cancer of the transverse colon. He underwent a subtotal colectomy. I reviewed the details of the surgical pathology report with Mr. Eckenrode. He has a good prognosis  with regard to the stage II colon cancer. I do not recommend adjuvant chemotherapy.  He plans to continue follow-up with Dr. [name] for surveillance of the rectum. He was found to have a duodenal adenoma on a screening endoscopy in September. He is being referred to [facility] for further evaluation.  We will check a CEA today to be sure this has normalized. He will return for an office visit, CEA, and screening urine cytology in 6 months.  I encouraged Mr. Dacy to be sure his family members are screened for [place].  He has an erythematous rash of the face consistent with rosacea. He will follow-up with his primary physician to evaluate the rash.  Approximately 50 minutes were spent with the patient today. The majority of the time was used for counseling and coordination of  care.  Betsy Coder, MD  10/18/2016, 2:39 PM

## 2016-10-19 LAB — CEA (IN HOUSE-CHCC): CEA (CHCC-In House): 6.99 ng/mL — ABNORMAL HIGH (ref 0.00–5.00)

## 2016-10-19 NOTE — Telephone Encounter (Signed)
I spoke with [facility] and they have been trying to reach the pt to set up appt.  I have spoken with the patient and provided the number to him. [phone]  He said he would call today.

## 2016-10-20 ENCOUNTER — Telehealth: Payer: Self-pay | Admitting: *Deleted

## 2016-10-20 DIAGNOSIS — C184 Malignant neoplasm of transverse colon: Secondary | ICD-10-CM

## 2016-10-20 NOTE — Telephone Encounter (Signed)
Oncology Nurse Navigator Documentation  Oncology Nurse Navigator Flowsheets 10/20/2016  Navigator Location [facility]  Referral date to RadOnc/MedOnc -  Navigator Encounter Type Telephone  Telephone Outgoing Call;Diagnostic Results--made him aware of CEA results-better, but still elevated, but could be related to smoking. MD wants to recheck in 1 month  Abnormal Finding Date -  Confirmed Diagnosis Date -  Surgery Date -  Patient Visit Type -  Treatment Phase Survivorship  Barriers/Navigation Needs Coordination of Care  Interventions Coordination of Care--informed Dr. [name] he sees Dr. Alphonzo Severance at [facility] on 11/11/16  Coordination of Care Appts--POF to scheduler for 1 month lab  Support Groups/Services -  Acuity -  Time Spent with Patient 15

## 2016-11-16 ENCOUNTER — Encounter: Payer: Self-pay | Admitting: Gastroenterology

## 2016-11-16 ENCOUNTER — Other Ambulatory Visit: Payer: Self-pay | Admitting: *Deleted

## 2016-11-16 ENCOUNTER — Other Ambulatory Visit (HOSPITAL_BASED_OUTPATIENT_CLINIC_OR_DEPARTMENT_OTHER): Payer: BLUE CROSS/BLUE SHIELD

## 2016-11-16 ENCOUNTER — Other Ambulatory Visit (HOSPITAL_COMMUNITY)
Admission: RE | Admit: 2016-11-16 | Discharge: 2016-11-16 | Disposition: A | Discharge status: Home / Self Care | Payer: BLUE CROSS/BLUE SHIELD | Source: Ambulatory Visit | Attending: Oncology | Admitting: Oncology

## 2016-11-16 DIAGNOSIS — C184 Malignant neoplasm of transverse colon: Secondary | ICD-10-CM | POA: Insufficient documentation

## 2016-11-16 LAB — CEA (IN HOUSE-CHCC): CEA (CHCC-In House): 6.12 ng/mL — ABNORMAL HIGH (ref 0.00–5.00)

## 2016-11-16 NOTE — Telephone Encounter (Signed)
Error

## 2016-11-18 ENCOUNTER — Telehealth: Payer: Self-pay | Admitting: *Deleted

## 2016-11-18 ENCOUNTER — Other Ambulatory Visit: Payer: BLUE CROSS/BLUE SHIELD

## 2016-11-18 NOTE — Telephone Encounter (Signed)
-----   Message from Ladell Pier, MD sent at 11/16/2016  9:01 PM EST ----- Please call patient, cea still mildly elevated, likely due to smoking, is he still smoking?, if yes repeat 2 months

## 2016-11-18 NOTE — Telephone Encounter (Signed)
Message left for patient to return call to [facility].

## 2016-11-22 ENCOUNTER — Telehealth: Payer: Self-pay | Admitting: *Deleted

## 2016-11-22 DIAGNOSIS — C184 Malignant neoplasm of transverse colon: Secondary | ICD-10-CM

## 2016-11-22 LAB — CYTOLOGY, URINE

## 2016-11-22 NOTE — Telephone Encounter (Signed)
Pt returned call. Informed him of negative urine cytology and elevated CEA result. He reports he is still smoking. Informed him schedulers will contact him with 2 month lab appt.  Pt voiced understanding.

## 2016-12-31 ENCOUNTER — Ambulatory Visit (INDEPENDENT_AMBULATORY_CARE_PROVIDER_SITE_OTHER): Payer: BLUE CROSS/BLUE SHIELD | Admitting: Family Medicine

## 2016-12-31 ENCOUNTER — Encounter: Payer: Self-pay | Admitting: Family Medicine

## 2016-12-31 VITALS — BP 133/78 | HR 79 | Temp 97.1°F | Ht 69.0 in | Wt 199.0 lb

## 2016-12-31 DIAGNOSIS — E559 Vitamin D deficiency, unspecified: Secondary | ICD-10-CM

## 2016-12-31 DIAGNOSIS — C184 Malignant neoplasm of transverse colon: Secondary | ICD-10-CM

## 2016-12-31 DIAGNOSIS — F1011 Alcohol abuse, in remission: Secondary | ICD-10-CM

## 2016-12-31 DIAGNOSIS — E663 Overweight: Secondary | ICD-10-CM

## 2016-12-31 DIAGNOSIS — R1031 Right lower quadrant pain: Secondary | ICD-10-CM | POA: Diagnosis not present

## 2016-12-31 NOTE — Progress Notes (Signed)
   HPI  Patient presents today  for follow-up chronic medical conditions and right lower quadrant abdominal pain.  Patient has one day history of right lower quadrant abdominal pain after lifting a heavy item. He states that it's improved after one day, however is still persistent. He's had recent surgery is worried about developing a hernia. Patient does not feel or appreciate any bulge in that area.  Alcohol abuse in remission Patient still only drinking moderately currently. No more than 6 beers a week and no more than 2 beers at any sitting.  He is not fasting but would like labs today.   PMH: Smoking status noted ROS: Per HPI  Objective: BP 133/78   Pulse 79   Temp 97.1 F (36.2 C) (Oral)   Ht '5\' 9"'$  (1.753 m)   Wt 199 lb (90.3 kg)   BMI 29.39 kg/m  Gen: NAD, alert, cooperative with exam HEENT: NCAT CV: RRR, good S1/S2, no murmur Resp: CTABL, no wheezes, non-labored Abd: Soft, mild tenderness to palpation in upper right lower quadrant, no clear abdominal wall defect, positive bowel sounds  Ext: No edema, warm Neuro: Alert and oriented, No gross deficits  Assessment and plan:  # Right lower quadrant abdominal pain Most likely muscle strain, improving after 1 day Low threshold for return with recent surgery, no signs of any serious intra-abdominal issue with normal food and fluid tolerance, afebrile, feeling better after 1 day  # Overweight Labs today including LDL Therapeutic lifestyle changes, congratulated on slowing down with alcohol use  # Alcohol abuse in remission Sustained improvement  # Vitamin D deficiency Previously vitamin D 23 Repeat labs  Cancer transverse colon Patient now status post surgery and doing well. Repeat labs He had further endoscopy done at [facility]   Orders Placed This Encounter  Procedures  . CMP14+EGFR  . CBC with Differential/Platelet  . LDL Cholesterol, Direct  . VITAMIN D 25 Hydroxy (Vit-D Deficiency, Fractures)    Laroy Apple, MD [facility] 12/31/2016, 5:38 PM

## 2016-12-31 NOTE — Patient Instructions (Signed)
Great to see you!  We will call with labs within 1 week.   Lets plan to follow up in 4 month sunless you need [place] sooner.

## 2017-01-01 LAB — CMP14+EGFR
A/G RATIO: 1.6 (ref 1.2–2.2)
ALT: 31 IU/L (ref 0–44)
AST: 25 IU/L (ref 0–40)
Albumin: 3.9 g/dL (ref 3.5–5.5)
Alkaline Phosphatase: 74 IU/L (ref 39–117)
BUN/Creatinine Ratio: 14 (ref 9–20)
BUN: 16 mg/dL (ref 6–24)
Bilirubin Total: 0.4 mg/dL (ref 0.0–1.2)
CALCIUM: 8.9 mg/dL (ref 8.7–10.2)
CHLORIDE: 103 mmol/L (ref 96–106)
CO2: 23 mmol/L (ref 18–29)
Creatinine, Ser: 1.12 mg/dL (ref 0.76–1.27)
GFR calc Af Amer: 92 mL/min/{1.73_m2} (ref >59)
GFR, EST NON AFRICAN AMERICAN: 79 mL/min/{1.73_m2} (ref >59)
GLUCOSE: 99 mg/dL (ref 65–99)
Globulin, Total: 2.5 g/dL (ref 1.5–4.5)
POTASSIUM: 4.2 mmol/L (ref 3.5–5.2)
Sodium: 142 mmol/L (ref 134–144)
Total Protein: 6.4 g/dL (ref 6.0–8.5)

## 2017-01-01 LAB — CBC WITH DIFFERENTIAL/PLATELET
BASOS ABS: 0.1 10*3/uL (ref 0.0–0.2)
BASOS: 1 %
EOS (ABSOLUTE): 0.2 10*3/uL (ref 0.0–0.4)
Eos: 2 %
Hematocrit: 46.6 % (ref 37.5–51.0)
Hemoglobin: 16.1 g/dL (ref 13.0–17.7)
IMMATURE GRANS (ABS): 0 10*3/uL (ref 0.0–0.1)
Immature Granulocytes: 0 %
LYMPHS: 20 %
Lymphocytes Absolute: 2 10*3/uL (ref 0.7–3.1)
MCH: 32.9 pg (ref 26.6–33.0)
MCHC: 34.5 g/dL (ref 31.5–35.7)
MCV: 95 fL (ref 79–97)
Monocytes Absolute: 0.8 10*3/uL (ref 0.1–0.9)
Monocytes: 8 %
Neutrophils Absolute: 6.9 10*3/uL (ref 1.4–7.0)
Neutrophils: 69 %
PLATELETS: 222 10*3/uL (ref 150–379)
RBC: 4.9 x10E6/uL (ref 4.14–5.80)
RDW: 15.2 % (ref 12.3–15.4)
WBC: 10 10*3/uL (ref 3.4–10.8)

## 2017-01-01 LAB — LDL CHOLESTEROL, DIRECT: LDL DIRECT: 34 mg/dL (ref 0–99)

## 2017-01-01 LAB — VITAMIN D 25 HYDROXY (VIT D DEFICIENCY, FRACTURES): Vit D, 25-Hydroxy: 13.1 ng/mL — ABNORMAL LOW (ref 30.0–100.0)

## 2017-01-03 ENCOUNTER — Other Ambulatory Visit: Payer: Self-pay | Admitting: Family Medicine

## 2017-01-03 DIAGNOSIS — E559 Vitamin D deficiency, unspecified: Secondary | ICD-10-CM

## 2017-01-03 MED ORDER — VITAMIN D (ERGOCALCIFEROL) 1.25 MG (50000 UNIT) PO CAPS: 50000.0000 [IU] | ORAL_CAPSULE | ORAL | 0 refills | Status: DC

## 2017-01-04 ENCOUNTER — Ambulatory Visit: Payer: BLUE CROSS/BLUE SHIELD | Admitting: Family Medicine

## 2017-01-20 ENCOUNTER — Encounter: Payer: Self-pay | Admitting: *Deleted

## 2017-02-08 ENCOUNTER — Encounter: Payer: Self-pay | Admitting: Physician Assistant

## 2017-02-08 ENCOUNTER — Ambulatory Visit (INDEPENDENT_AMBULATORY_CARE_PROVIDER_SITE_OTHER): Payer: BLUE CROSS/BLUE SHIELD

## 2017-02-08 ENCOUNTER — Ambulatory Visit (INDEPENDENT_AMBULATORY_CARE_PROVIDER_SITE_OTHER): Payer: BLUE CROSS/BLUE SHIELD | Admitting: Physician Assistant

## 2017-02-08 VITALS — BP 119/69 | HR 92 | Temp 99.3°F | Ht 69.0 in | Wt 196.0 lb

## 2017-02-08 DIAGNOSIS — M79672 Pain in left foot: Secondary | ICD-10-CM

## 2017-02-08 DIAGNOSIS — G8929 Other chronic pain: Secondary | ICD-10-CM | POA: Diagnosis not present

## 2017-02-08 DIAGNOSIS — M79671 Pain in right foot: Secondary | ICD-10-CM | POA: Diagnosis not present

## 2017-02-08 MED ORDER — PREDNISONE 10 MG (48) PO TBPK: ORAL_TABLET | ORAL | 0 refills | Status: DC

## 2017-02-08 NOTE — Progress Notes (Signed)
BP 119/69   Pulse 92   Temp 99.3 F (37.4 C) (Oral)   Ht 5\' 9"  (1.753 m)   Wt 196 lb (88.9 kg)   BMI 28.94 kg/m    Subjective:    Patient ID: Charles Santos, male    DOB: Jan 21, 1971, 46 y.o.   MRN: [id number]  HPI: Charles Santos is a 46 y.o. male presenting on 02/08/2017 for bone spur  (? bone spur both feet )  Has had pain in right heel and achilles tendon, worse after he worked all day. Works 12 hour shifts. Left foot has pain in the medial portion and arch.  No specific injury recalled.  Relevant past medical, surgical, family and social history reviewed and updated as indicated. Allergies and medications reviewed and updated.  Past Medical History:  Diagnosis Date  . Anxiety   . Cancer of transverse colon ([place]) 09/17/2016  . Colon cancer ([place]) 1998  . Depression   . Emphysema of lung ([place])   . Family hx of colon cancer   . GERD (gastroesophageal reflux disease)   . Hyperlipidemia     Past Surgical History:  Procedure Laterality Date  . APPENDECTOMY    . colon resectomy  1998   2 degree herida  . COLONOSCOPY    . INGUINAL HERNIA REPAIR    . LAPAROSCOPIC SUBTOTAL COLECTOMY N/A 09/17/2016   Procedure: DIAGNOSTIC LAPAROSCOPY EXPLORATORY LAPAROTOMY SUBTOTAL COLECTOMY PROCTOSCOPY;  Surgeon: [name], MD;  Location: [facility];  Service: General;  Laterality: N/A;  . PROCTOSCOPY  09/17/2016   Procedure: PROCTOSCOPY;  Surgeon: [name], MD;  Location: [facility];  Service: General;;    Review of Systems  Constitutional: Negative.  Negative for appetite change and fatigue.  Eyes: Negative for pain and visual disturbance.  Respiratory: Negative.  Negative for cough, chest tightness, shortness of breath and wheezing.   Cardiovascular: Negative.  Negative for chest pain, palpitations and leg swelling.  Gastrointestinal: Negative.  Negative for abdominal pain, diarrhea, nausea and vomiting.  Genitourinary: Negative.   Musculoskeletal: Positive for arthralgias, gait problem  and joint swelling.  Skin: Negative.  Negative for color change and rash.  Neurological: Negative for weakness, numbness and headaches.  Psychiatric/Behavioral: Negative.     Allergies as of 02/08/2017   No Known Allergies     Medication List       Accurate as of 02/08/17  9:37 PM. Always use your most recent med list.          acetaminophen 650 MG CR tablet Commonly known as:  TYLENOL Take 650 mg by mouth every 8 (eight) hours as needed for pain.   predniSONE 10 MG (48) Tbpk tablet Commonly known as:  STERAPRED UNI-PAK 48 TAB Take 12 day course as directed   Vitamin D (Ergocalciferol) 50000 units Caps capsule Commonly known as:  DRISDOL Take 1 capsule (50,000 Units total) by mouth every 7 (seven) days.          Objective:    BP 119/69   Pulse 92   Temp 99.3 F (37.4 C) (Oral)   Ht 5\' 9"  (1.753 m)   Wt 196 lb (88.9 kg)   BMI 28.94 kg/m   No Known Allergies  Physical Exam  Constitutional: He appears well-developed and well-nourished. No distress.  HENT:  Head: Normocephalic and atraumatic.  Eyes: Conjunctivae and EOM are normal. Pupils are equal, round, and reactive to light.  Cardiovascular: Normal rate, regular rhythm and normal heart sounds.   Pulmonary/Chest: Effort normal and breath  sounds normal. No respiratory distress.  Musculoskeletal:       Left foot: There is decreased range of motion and tenderness. There is no deformity.       Feet:  Skin: Skin is warm and dry.  Psychiatric: He has a normal mood and affect. His behavior is normal.  Nursing note and vitals reviewed.       Assessment & Plan:   1. Chronic pain in left foot - DG Foot Complete Left; Future - predniSONE (STERAPRED UNI-PAK 48 TAB) 10 MG (48) TBPK tablet; Take 12 day course as directed  Dispense: 48 tablet; Refill: 0  2. Foot pain, right - DG Foot Complete Right; Future   Continue all other maintenance medications as listed above.  Follow up plan: Return if symptoms  worsen or fail to improve.  Educational handout given for foot and heel pain  Terald Sleeper PA-C [facility] [address]  [place], [place] [address] [phone]   02/08/2017, 9:37 PM

## 2017-02-08 NOTE — Patient Instructions (Signed)
Foot Pain Many things can cause foot pain. Some common causes are:  An injury.  A sprain.  Arthritis.  Blisters.  Bunions. Follow these instructions at home: Pay attention to any changes in your symptoms. Take these actions to help with your discomfort:  If directed, put ice on the affected area:  Put ice in a plastic bag.  Place a towel between your skin and the bag.  Leave the ice on for 15-20 minutes, 3?4 times a day for 2 days.  Take over-the-counter and prescription medicines only as told by your health care provider.  Wear comfortable, supportive shoes that fit you well. Do not wear high heels.  Do not stand or walk for long periods of time.  Do not lift a lot of weight. This can put added pressure on your feet.  Do stretches to relieve foot pain and stiffness as told by your health care provider.  Rub your foot gently.  Keep your feet clean and dry. Contact a health care provider if:  Your pain does not get better after a few days of self-care.  Your pain gets worse.  You cannot stand on your foot. Get help right away if:  Your foot is numb or tingling.  Your foot or toes are swollen.  Your foot or toes turn white or blue.  You have warmth and redness along your foot. This information is not intended to replace advice given to you by your health care provider. Make sure you discuss any questions you have with your health care provider. Document Released: 12/26/2015 Document Revised: 05/06/2016 Document Reviewed: 12/25/2014 Elsevier Interactive Patient Education  2017 [employer]. Achilles Tendinitis Achilles tendinitis is inflammation of the tough, cord-like band that attaches the lower leg muscles to the heel bone (Achilles tendon). This is usually caused by overusing the tendon and the ankle joint. Achilles tendinitis usually gets better over time with treatment and caring for yourself at home. It can take weeks or months to heal completely. What  are the causes? This condition may be caused by:  A sudden increase in exercise or activity, such as running.  Doing the same exercises or activities (such as jumping) over and over.  Not warming up calf muscles before exercising.  Exercising in shoes that are worn out or not made for exercise.  Having arthritis or a bone growth (spur) on the back of the heel bone. This can rub against the tendon and hurt it.  Age-related wear and tear. Tendons become less flexible with age and more likely to be injured. What are the signs or symptoms? Common symptoms of this condition include:  Pain in the Achilles tendon or in the back of the leg, just above the heel. The pain usually gets worse with exercise.  Stiffness or soreness in the back of the leg, especially in the morning.  Swelling of the skin over the Achilles tendon.  Thickening of the tendon.  Bone spurs at the bottom of the Achilles tendon, near the heel.  Trouble standing on tiptoe. How is this diagnosed? This condition is diagnosed based on your symptoms and a physical exam. You may have tests, including:  X-rays.  MRI. How is this treated? The goal of treatment is to relieve symptoms and help your injury heal. Treatment may include:  Decreasing or stopping activities that caused the tendinitis. This may mean switching to low-impact exercises like biking or swimming.  Icing the injured area.  Doing physical therapy, including strengthening and stretching exercises.  NSAIDs to help relieve pain and swelling.  Using supportive shoes, wraps, heel lifts, or a walking boot (air cast).  Surgery. This may be done if your symptoms do not improve after 6 months.  Using high-energy shock wave impulses to stimulate the healing process (extracorporeal shock wave therapy). This is rare.  Injection of medicines to help relieve inflammation (corticosteroids). This is rare. Follow these instructions at home: If you have an air  cast:   Wear the cast as told by your health care provider. Remove it only as told by your health care provider.  Loosen the cast if your toes tingle, become numb, or turn cold and blue. Activity   Gradually return to your normal activities once your health care provider approves. Do not do activities that cause pain.  Consider doing low-impact exercises, like cycling or swimming.  If you have an air cast, ask your health care provider when it is safe for you to drive.  If physical therapy was prescribed, do exercises as told by your health care provider or physical therapist. Managing pain, stiffness, and swelling   Raise (elevate) your foot above the level of your heart while you are sitting or lying down.  Move your toes often to avoid stiffness and to lessen swelling.  If directed, put ice on the injured area:  Put ice in a plastic bag.  Place a towel between your skin and the bag.  Leave the ice on for 20 minutes, 2-3 times a day General instructions   If directed, wrap your foot with an elastic bandage or other wrap. This can help keep your tendon from moving too much while it heals. Your health care provider will show you how to wrap your foot correctly.  Wear supportive shoes or heel lifts only as told by your health care provider.  Take over-the-counter and prescription medicines only as told by your health care provider.  Keep all follow-up visits as told by your health care provider. This is important. Contact a health care provider if:  You have symptoms that gets worse.  You have pain that does not get better with medicine.  You develop new, unexplained symptoms.  You develop warmth and swelling in your foot.  You have a fever. Get help right away if:  You have a sudden popping sound or sensation in your Achilles tendon followed by severe pain.  You cannot move your toes or foot.  You cannot put any weight on your foot. Summary  Achilles tendinitis  is inflammation of the tough, cord-like band that attaches the lower leg muscles to the heel bone (Achilles tendon).  This condition is usually caused by overusing the tendon and the ankle joint. It can also be caused by arthritis or normal aging.  The most common symptoms of this condition include pain, swelling, or stiffness in the Achilles tendon or in the back of the leg.  This condition is usually treated with rest, NSAIDs, and physical therapy. This information is not intended to replace advice given to you by your health care provider. Make sure you discuss any questions you have with your health care provider. Document Released: 09/08/2005 Document Revised: 10/18/2016 Document Reviewed: 10/18/2016 Elsevier Interactive Patient Education  2017 [employer].

## 2017-03-21 ENCOUNTER — Encounter: Payer: Self-pay | Admitting: Family Medicine

## 2017-03-21 ENCOUNTER — Ambulatory Visit (INDEPENDENT_AMBULATORY_CARE_PROVIDER_SITE_OTHER): Payer: BLUE CROSS/BLUE SHIELD | Admitting: Family Medicine

## 2017-03-21 ENCOUNTER — Other Ambulatory Visit: Payer: Self-pay

## 2017-03-21 VITALS — BP 125/73 | HR 65 | Temp 97.8°F | Ht 69.0 in | Wt 200.0 lb

## 2017-03-21 DIAGNOSIS — L719 Rosacea, unspecified: Secondary | ICD-10-CM

## 2017-03-21 DIAGNOSIS — Z0000 Encounter for general adult medical examination without abnormal findings: Secondary | ICD-10-CM | POA: Diagnosis not present

## 2017-03-21 DIAGNOSIS — F1011 Alcohol abuse, in remission: Secondary | ICD-10-CM

## 2017-03-21 DIAGNOSIS — E559 Vitamin D deficiency, unspecified: Secondary | ICD-10-CM

## 2017-03-21 DIAGNOSIS — E782 Mixed hyperlipidemia: Secondary | ICD-10-CM

## 2017-03-21 DIAGNOSIS — Z720 Tobacco use: Secondary | ICD-10-CM

## 2017-03-21 DIAGNOSIS — K4090 Unilateral inguinal hernia, without obstruction or gangrene, not specified as recurrent: Secondary | ICD-10-CM

## 2017-03-21 MED ORDER — METRONIDAZOLE 1 % EX GEL
Freq: Every day | CUTANEOUS | 0 refills | Status: DC
Start: 2017-03-21 — End: 2018-01-19

## 2017-03-21 NOTE — Progress Notes (Signed)
Subjective:  Patient ID: Charles Santos, male    DOB: June 24, 1971  Age: 46 y.o. MRN: [id number]  CC: Annual Exam (pt here today for CPE/ work physical)   HPI Charles Santos presents for Complete physical for work. She is continuing to have diarrhea from his partial colectomy. He has 4-7 loose bowel movements a day on regular basis. He is continuing under the care of gastroenterology for that concern. He is having colonoscopies on annual basis. He carries a colon cancer gene. Additionally he is a smoker. He says he like to quit but says," that isn't going to happen." He is not interested in any interventional treatments such as Chantix. Patient was recently diagnosed with a low vitamin D. He is to have a repeat level as part of today's exam. He has started occasionally drinking alcohol again but nowhere near as much as he has in the past. Patient relates that he has a sensation in his left lower quadrant as if his stomach has moved down into that area. It seems like it swells when he gets hungry   History Charles Santos has a past medical history of Anxiety; Cancer of transverse colon ([place]) (09/17/2016); Colon cancer ([place]) (1998); Depression; Emphysema of lung ([place]); Family hx of colon cancer; GERD (gastroesophageal reflux disease); and Hyperlipidemia.   He has a past surgical history that includes colon resectomy (1998); Appendectomy; Colonoscopy; Inguinal hernia repair; Laparoscopic subtotal colectomy (N/A, 09/17/2016); and Proctoscopy (09/17/2016).   His family history includes Colon cancer in his father.He reports that he has been smoking Cigarettes.  He has a 28.75 pack-year smoking history. He has never used smokeless tobacco. He reports that he drinks about 40.8 oz of alcohol per week . He reports that he does not use drugs.    ROS Review of Systems  Constitutional: Negative for activity change, appetite change, chills, diaphoresis, fatigue, fever and unexpected weight change.  HENT: Negative for  congestion, ear pain, hearing loss, postnasal drip, rhinorrhea, sore throat, tinnitus and trouble swallowing.   Eyes: Negative for photophobia, pain, discharge and redness.  Respiratory: Negative for apnea, cough, choking, chest tightness, shortness of breath, wheezing and stridor.   Cardiovascular: Negative for chest pain, palpitations and leg swelling.  Gastrointestinal: Positive for abdominal pain (LLQ feels like stomach fell into the area were they took out the colon). Negative for abdominal distention, blood in stool, constipation, diarrhea, nausea and vomiting.  Endocrine: Negative for cold intolerance, heat intolerance, polydipsia, polyphagia and polyuria.  Genitourinary: Negative for difficulty urinating, dysuria, enuresis, flank pain, frequency, genital sores, hematuria and urgency.  Musculoskeletal: Positive for myalgias (soreness from working, nonfocal). Negative for arthralgias and joint swelling.  Skin: Positive for rash (facial rash from drinking). Negative for color change and wound.  Allergic/Immunologic: Negative for immunocompromised state.  Neurological: Negative for dizziness, tremors, seizures, syncope, facial asymmetry, speech difficulty, weakness, light-headedness, numbness and headaches.  Hematological: Does not bruise/bleed easily.  Psychiatric/Behavioral: Negative for agitation, behavioral problems, confusion, decreased concentration, dysphoric mood, hallucinations, sleep disturbance and suicidal ideas. The patient is not nervous/anxious and is not hyperactive.     Objective:  BP 125/73   Pulse 65   Temp 97.8 F (36.6 C) (Oral)   Ht 5' 9"  (1.753 m)   Wt 200 lb (90.7 kg)   BMI 29.53 kg/m   BP Readings from Last 3 Encounters:  03/21/17 125/73  02/08/17 119/69  12/31/16 133/78    Wt Readings from Last 3 Encounters:  03/21/17 200 lb (90.7 kg)  02/08/17 196 lb (  88.9 kg)  12/31/16 199 lb (90.3 kg)     Physical Exam  Constitutional: He is oriented to person,  place, and time. He appears well-developed and well-nourished.  HENT:  Head: Normocephalic and atraumatic.  Mouth/Throat: Oropharynx is clear and moist.  Eyes: EOM are normal. Pupils are equal, round, and reactive to light.  Neck: Normal range of motion. No tracheal deviation present. No thyromegaly present.  Cardiovascular: Normal rate, regular rhythm and normal heart sounds.  Exam reveals no gallop and no friction rub.   No murmur heard. Pulmonary/Chest: Breath sounds normal. He has no wheezes. He has no rales.  Abdominal: Soft. He exhibits no mass. There is no tenderness. A hernia is present. Hernia confirmed positive in the left inguinal area. Hernia confirmed negative in the right inguinal area.  Genitourinary: Testes normal and penis normal.  Musculoskeletal: Normal range of motion. He exhibits no edema.  Neurological: He is alert and oriented to person, place, and time.  Skin: Skin is warm and dry.  Moderately severe erythema with multiple papules covering most of the cheeks and nose.  Psychiatric: He has a normal mood and affect.      Assessment & Plan:   Charles Santos was seen today for annual exam.  Diagnoses and all orders for this visit:  Well adult exam -     CBC with Differential/Platelet -     PSA Total (Reflex To Free) -     CMP14+EGFR  Mixed hyperlipidemia -     CBC with Differential/Platelet -     CMP14+EGFR  Vitamin D deficiency -     CBC with Differential/Platelet -     VITAMIN D 25 Hydroxy (Vit-D Deficiency, Fractures) -     CMP14+EGFR  Alcohol abuse, in remission -     CBC with Differential/Platelet -     CMP14+EGFR  Tobacco abuse -     CBC with Differential/Platelet -     CMP14+EGFR  Left inguinal hernia -     US Scrotum; Future  Rosacea  Other orders -     metroNIDAZOLE (METROGEL) 1 % gel; Apply topically daily.       I am having Charles Santos start on metroNIDAZOLE. I am also having him maintain his acetaminophen, Vitamin D (Ergocalciferol),  and predniSONE.  Allergies as of 03/21/2017   No Known Allergies     Medication List       Accurate as of 03/21/17 12:04 PM. Always use your most recent med list.          acetaminophen 650 MG CR tablet Commonly known as:  TYLENOL Take 650 mg by mouth every 8 (eight) hours as needed for pain.   metroNIDAZOLE 1 % gel Commonly known as:  METROGEL Apply topically daily.   predniSONE 10 MG (48) Tbpk tablet Commonly known as:  STERAPRED UNI-PAK 48 TAB Take 12 day course as directed   Vitamin D (Ergocalciferol) 50000 units Caps capsule Commonly known as:  DRISDOL Take 1 capsule (50,000 Units total) by mouth every 7 (seven) days.        Follow-up: Return in about 6 months (around 09/20/2017).  Claretta Fraise, M.D.

## 2017-03-22 LAB — CMP14+EGFR
A/G RATIO: 1.6 (ref 1.2–2.2)
ALT: 26 IU/L (ref 0–44)
AST: 26 IU/L (ref 0–40)
Albumin: 3.7 g/dL (ref 3.5–5.5)
Alkaline Phosphatase: 66 IU/L (ref 39–117)
BILIRUBIN TOTAL: 0.5 mg/dL (ref 0.0–1.2)
BUN / CREAT RATIO: 11 (ref 9–20)
BUN: 10 mg/dL (ref 6–24)
CHLORIDE: 106 mmol/L (ref 96–106)
CO2: 21 mmol/L (ref 18–29)
Calcium: 8.8 mg/dL (ref 8.7–10.2)
Creatinine, Ser: 0.89 mg/dL (ref 0.76–1.27)
GFR calc non Af Amer: 104 mL/min/{1.73_m2} (ref >59)
GFR, EST AFRICAN AMERICAN: 120 mL/min/{1.73_m2} (ref >59)
GLOBULIN, TOTAL: 2.3 g/dL (ref 1.5–4.5)
Glucose: 97 mg/dL (ref 65–99)
POTASSIUM: 3.9 mmol/L (ref 3.5–5.2)
SODIUM: 140 mmol/L (ref 134–144)
Total Protein: 6 g/dL (ref 6.0–8.5)

## 2017-03-22 LAB — CBC WITH DIFFERENTIAL/PLATELET
Basophils Absolute: 0 10*3/uL (ref 0.0–0.2)
Basos: 1 %
EOS (ABSOLUTE): 0.2 10*3/uL (ref 0.0–0.4)
Eos: 3 %
Hematocrit: 45.8 % (ref 37.5–51.0)
Hemoglobin: 15.8 g/dL (ref 13.0–17.7)
IMMATURE GRANULOCYTES: 0 %
Immature Grans (Abs): 0 10*3/uL (ref 0.0–0.1)
LYMPHS ABS: 1.2 10*3/uL (ref 0.7–3.1)
Lymphs: 18 %
MCH: 33.3 pg — ABNORMAL HIGH (ref 26.6–33.0)
MCHC: 34.5 g/dL (ref 31.5–35.7)
MCV: 97 fL (ref 79–97)
MONOS ABS: 0.6 10*3/uL (ref 0.1–0.9)
Monocytes: 9 %
NEUTROS PCT: 69 %
Neutrophils Absolute: 4.8 10*3/uL (ref 1.4–7.0)
PLATELETS: 183 10*3/uL (ref 150–379)
RBC: 4.74 x10E6/uL (ref 4.14–5.80)
RDW: 14.1 % (ref 12.3–15.4)
WBC: 6.9 10*3/uL (ref 3.4–10.8)

## 2017-03-22 LAB — VITAMIN D 25 HYDROXY (VIT D DEFICIENCY, FRACTURES): Vit D, 25-Hydroxy: 35.1 ng/mL (ref 30.0–100.0)

## 2017-03-22 LAB — PSA TOTAL (REFLEX TO FREE): Prostate Specific Ag, Serum: 0.7 ng/mL (ref 0.0–4.0)

## 2017-03-24 ENCOUNTER — Ambulatory Visit (HOSPITAL_COMMUNITY): Payer: BLUE CROSS/BLUE SHIELD

## 2017-03-25 ENCOUNTER — Ambulatory Visit (HOSPITAL_COMMUNITY)
Admission: RE | Admit: 2017-03-25 | Discharge: 2017-03-25 | Disposition: A | Discharge status: Home / Self Care | Payer: BLUE CROSS/BLUE SHIELD | Source: Ambulatory Visit | Attending: Family Medicine | Admitting: Family Medicine

## 2017-03-25 ENCOUNTER — Other Ambulatory Visit: Payer: Self-pay | Admitting: Family Medicine

## 2017-03-25 DIAGNOSIS — K4090 Unilateral inguinal hernia, without obstruction or gangrene, not specified as recurrent: Secondary | ICD-10-CM | POA: Diagnosis not present

## 2017-03-25 DIAGNOSIS — N433 Hydrocele, unspecified: Secondary | ICD-10-CM | POA: Insufficient documentation

## 2017-04-19 ENCOUNTER — Ambulatory Visit (HOSPITAL_BASED_OUTPATIENT_CLINIC_OR_DEPARTMENT_OTHER): Payer: BLUE CROSS/BLUE SHIELD | Admitting: Oncology

## 2017-04-19 ENCOUNTER — Other Ambulatory Visit (HOSPITAL_BASED_OUTPATIENT_CLINIC_OR_DEPARTMENT_OTHER): Payer: BLUE CROSS/BLUE SHIELD

## 2017-04-19 ENCOUNTER — Other Ambulatory Visit (HOSPITAL_COMMUNITY)
Admission: RE | Admit: 2017-04-19 | Discharge: 2017-04-19 | Disposition: A | Discharge status: Home / Self Care | Payer: BLUE CROSS/BLUE SHIELD | Source: Ambulatory Visit | Attending: Oncology | Admitting: Oncology

## 2017-04-19 VITALS — BP 129/67 | HR 75 | Resp 18 | Ht 69.0 in | Wt 199.9 lb

## 2017-04-19 DIAGNOSIS — Z720 Tobacco use: Secondary | ICD-10-CM | POA: Diagnosis not present

## 2017-04-19 DIAGNOSIS — F419 Anxiety disorder, unspecified: Secondary | ICD-10-CM

## 2017-04-19 DIAGNOSIS — C184 Malignant neoplasm of transverse colon: Secondary | ICD-10-CM | POA: Diagnosis not present

## 2017-04-19 DIAGNOSIS — Z85038 Personal history of other malignant neoplasm of large intestine: Secondary | ICD-10-CM | POA: Diagnosis not present

## 2017-04-19 DIAGNOSIS — F329 Major depressive disorder, single episode, unspecified: Secondary | ICD-10-CM

## 2017-04-19 LAB — CEA (IN HOUSE-CHCC): CEA (CHCC-In House): 8.57 ng/mL — ABNORMAL HIGH (ref 0.00–5.00)

## 2017-04-19 NOTE — Progress Notes (Signed)
  [facility] OFFICE PROGRESS NOTE   Diagnosis: Colon cancer, hereditary non-polyposis colon cancer syndrome  INTERVAL HISTORY:   Charles Santos returns as scheduled. He has frequent bowel movements. He is working. He underwent endoscopic resection of the duodenal adenoma at [facility] on 12/21/2016. 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 No high-grade dysplasia or carcinoma was identified. He continues smoking.  Objective:  Vital signs in last 24 hours:  Blood pressure 129/67, pulse 75, resp. rate 18, height _0  (1.753 m), weight 199 lb 14.4 oz (90.7 kg), SpO2 99 %.    HEENT: Neck without mass Lymphatics: No cervical, supraclavicular, or axillary nodes Resp: Lungs with scattered wheezes bilaterally, no respiratory distress Cardio: Regular rate and rhythm GI: No hepatosplenomegaly, no mass Vascular: No leg edema   Lab Results: CEA-pending  Medications: I have reviewed the patient's current medications.  Assessment/Plan: 1. Adenocarcinoma the transverse colon, stage II (T3 N0), status post subtotal colectomy 09/17/2016  2. History of right-sided colon cancer at age 19-status post a right colectomy and adjuvant 5-FU/leucovorin  3.   Hereditary non-polyposis colon cancer syndrome-MSH2 mutation  4.   Erythematous rash of the face-rosacea?  5.   Family history of colon cancer  6.   Tobacco use  7.   Anxiety/depression  8.   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 He will continue endoscopic surveillance with Dr. [name]. We will follow-up on the CEA from today. He will return for an office visit and CEA in 6 months. He declines an appointment with the genetics counselors.  Betsy Coder, MD  04/19/2017  2:51  PM

## 2017-04-21 ENCOUNTER — Telehealth: Payer: Self-pay | Admitting: *Deleted

## 2017-04-21 DIAGNOSIS — C184 Malignant neoplasm of transverse colon: Secondary | ICD-10-CM

## 2017-04-21 LAB — CYTOLOGY, URINE

## 2017-04-21 NOTE — Telephone Encounter (Signed)
-----   Message from Ladell Pier, MD sent at 04/20/2017  7:31 PM EDT ----- Please call patient, urine cytology is negative, cea is still mildly elevated, likely related to smoking, repeat cea in 2 months, we will schedule CTs  If there is a consistent rise in the CEA, can leave message on his cell phone

## 2017-04-21 NOTE — Telephone Encounter (Signed)
Message left on patient's cell phone per order of Dr. Benay Spice to inform him that urine cytology is negative, cea is still mildly elevated, likely related to smoking, repeat cea in 2 months and we will schedule CT's if there is a consistent rise in the CEA.  Instructed Charles Santos to call [place] back with any questions or concerns.

## 2017-04-22 ENCOUNTER — Telehealth: Payer: Self-pay | Admitting: Oncology

## 2017-04-22 NOTE — Telephone Encounter (Signed)
Patient bypassed scheduling on 04/19/17. Appointments scheduled per 04/19/17 los. Patient was mailed an appointment letter and schedule.

## 2017-05-03 ENCOUNTER — Ambulatory Visit: Payer: BLUE CROSS/BLUE SHIELD | Admitting: Family Medicine

## 2017-05-06 ENCOUNTER — Telehealth: Payer: Self-pay | Admitting: Gastroenterology

## 2017-05-06 NOTE — Telephone Encounter (Signed)
The pt walked in and wanted to discuss his recent labs, he states Dr Benay Spice wants Dr [name] opinion on sooner colonoscopy due to "abnormal labs".  Pt aware I will forward to Dr [name] to review the chart and give recommendations.  He is aware it may be next week.

## 2017-05-10 ENCOUNTER — Ambulatory Visit: Payer: BLUE CROSS/BLUE SHIELD | Admitting: Gastroenterology

## 2017-05-10 NOTE — Telephone Encounter (Signed)
  [name], He needs lec flexible sigmoidoscopy and also EGD for lynch syndrome, h/o colon cancer/ h/o duodenal polyps, rising CEA  Brad, We'll get him in for this in next few weeks. thanks

## 2017-05-10 NOTE — Telephone Encounter (Signed)
Left message on machine to call back  

## 2017-05-11 ENCOUNTER — Other Ambulatory Visit: Payer: Self-pay

## 2017-05-11 DIAGNOSIS — C189 Malignant neoplasm of colon, unspecified: Secondary | ICD-10-CM

## 2017-05-11 DIAGNOSIS — Z1509 Genetic susceptibility to other malignant neoplasm: Secondary | ICD-10-CM

## 2017-05-11 DIAGNOSIS — R970 Elevated carcinoembryonic antigen [CEA]: Secondary | ICD-10-CM

## 2017-05-11 NOTE — Telephone Encounter (Signed)
EGD/FLEX scheduled, pt instructed and medications reviewed. Patient instructions mailed to home.  Patient to call with any questions or concerns.

## 2017-05-11 NOTE — Telephone Encounter (Signed)
Patient returning [name]'s call  °

## 2017-05-11 NOTE — Telephone Encounter (Signed)
Dr [name] your next appt for [place] is 06/28/17.  Do you want to add him to your Thursday schedule?  He states he "can't" have any procedures in July due to work.  Please advise

## 2017-05-11 NOTE — Telephone Encounter (Signed)
Left message on machine to call back  

## 2017-05-11 NOTE — Telephone Encounter (Signed)
Yes, ok to add to next available Thursday MAC day at [facility].  thanks

## 2017-05-12 ENCOUNTER — Encounter: Payer: Self-pay | Admitting: Family Medicine

## 2017-05-12 ENCOUNTER — Ambulatory Visit (INDEPENDENT_AMBULATORY_CARE_PROVIDER_SITE_OTHER): Payer: BLUE CROSS/BLUE SHIELD | Admitting: Family Medicine

## 2017-05-12 VITALS — BP 135/82 | HR 80 | Temp 98.5°F | Ht 69.0 in | Wt 202.0 lb

## 2017-05-12 DIAGNOSIS — E559 Vitamin D deficiency, unspecified: Secondary | ICD-10-CM

## 2017-05-12 DIAGNOSIS — R21 Rash and other nonspecific skin eruption: Secondary | ICD-10-CM | POA: Diagnosis not present

## 2017-05-12 DIAGNOSIS — F1011 Alcohol abuse, in remission: Secondary | ICD-10-CM

## 2017-05-12 NOTE — Progress Notes (Signed)
   HPI  Patient presents today for follow-up chronic medical conditions.  Vitamin D deficiency Needs refill of 50,000 units  Alcohol abuse Drinking some, however not as much as he was previously.  Colon cancer Patient concerned about recent elevated CEA, he has a coming up EGD and flexible sigmoidoscopy.  Rosacea Patient using metronidazole gel 1, cannot tell difference and did not try again.   PMH: Smoking status noted ROS: Per HPI  Objective: BP 135/82   Pulse 80   Temp 98.5 F (36.9 C) (Oral)   Ht 5\' 9"  (1.753 m)   Wt 202 lb (91.6 kg)   BMI 29.83 kg/m  Gen: NAD, alert, cooperative with exam HEENT: NCAT, facial rash bilateral cheeks with erythema and papular changes, improved from previous visit CV: RRR, good S1/S2, no murmur Resp: CTABL, no wheezes, non-labored Ext: No edema, warm Neuro: Alert and oriented, No gross deficits  Depression screen [facility] 2/9 05/12/2017 03/21/2017 12/31/2016 10/04/2016 09/10/2016  Decreased Interest 0 0 0 0 0  Down, Depressed, Hopeless 0 0 0 0 0  PHQ - 2 Score 0 0 0 0 0  Altered sleeping - - - - -  Tired, decreased energy - - - - -  Change in appetite - - - - -  Feeling bad or failure about yourself  - - - - -  Trouble concentrating - - - - -  Moving slowly or fidgety/restless - - - - -  Suicidal thoughts - - - - -  PHQ-9 Score - - - - -  Difficult doing work/chores - - - - -     Assessment and plan:  # Facial rash Most likely rosacea, encouraged to try metronidazole for a slightly longer time. Has not tried once  # Vitamin D deficiency No need to continue 50,000 units a week, recommended 2000 units vitamin D3 daily.   # Alcohol abuse in remission Some regression of previous cessation, however not abusing alcohol like he was previously. Overall patient appears very well and looks much better than her previous visits. He looks very happy   Laroy Apple, MD [facility] 05/12/2017, 2:34 PM

## 2017-05-18 ENCOUNTER — Encounter (HOSPITAL_COMMUNITY): Payer: Self-pay | Admitting: *Deleted

## 2017-05-19 ENCOUNTER — Ambulatory Visit (HOSPITAL_COMMUNITY)
Admission: RE | Admit: 2017-05-19 | Discharge: 2017-05-19 | Disposition: A | Discharge status: Home / Self Care | Payer: BLUE CROSS/BLUE SHIELD | Source: Ambulatory Visit | Attending: Gastroenterology | Admitting: Gastroenterology

## 2017-05-19 ENCOUNTER — Ambulatory Visit (HOSPITAL_COMMUNITY): Payer: BLUE CROSS/BLUE SHIELD | Admitting: Anesthesiology

## 2017-05-19 ENCOUNTER — Encounter (HOSPITAL_COMMUNITY): Payer: Self-pay | Admitting: *Deleted

## 2017-05-19 ENCOUNTER — Encounter (HOSPITAL_COMMUNITY)
Admission: RE | Disposition: A | Discharge status: Home / Self Care | Payer: Self-pay | Source: Ambulatory Visit | Attending: Gastroenterology

## 2017-05-19 DIAGNOSIS — Z1509 Genetic susceptibility to other malignant neoplasm: Secondary | ICD-10-CM

## 2017-05-19 DIAGNOSIS — F419 Anxiety disorder, unspecified: Secondary | ICD-10-CM | POA: Diagnosis not present

## 2017-05-19 DIAGNOSIS — F329 Major depressive disorder, single episode, unspecified: Secondary | ICD-10-CM | POA: Insufficient documentation

## 2017-05-19 DIAGNOSIS — Z800 Family history of malignant neoplasm of digestive organs: Secondary | ICD-10-CM | POA: Insufficient documentation

## 2017-05-19 DIAGNOSIS — K2970 Gastritis, unspecified, without bleeding: Secondary | ICD-10-CM | POA: Insufficient documentation

## 2017-05-19 DIAGNOSIS — K219 Gastro-esophageal reflux disease without esophagitis: Secondary | ICD-10-CM | POA: Insufficient documentation

## 2017-05-19 DIAGNOSIS — Z08 Encounter for follow-up examination after completed treatment for malignant neoplasm: Secondary | ICD-10-CM | POA: Insufficient documentation

## 2017-05-19 DIAGNOSIS — Z86010 Personal history of colonic polyps: Secondary | ICD-10-CM | POA: Insufficient documentation

## 2017-05-19 DIAGNOSIS — Z9049 Acquired absence of other specified parts of digestive tract: Secondary | ICD-10-CM | POA: Insufficient documentation

## 2017-05-19 DIAGNOSIS — Z85038 Personal history of other malignant neoplasm of large intestine: Secondary | ICD-10-CM | POA: Diagnosis not present

## 2017-05-19 DIAGNOSIS — R970 Elevated carcinoembryonic antigen [CEA]: Secondary | ICD-10-CM | POA: Diagnosis not present

## 2017-05-19 DIAGNOSIS — C189 Malignant neoplasm of colon, unspecified: Secondary | ICD-10-CM

## 2017-05-19 DIAGNOSIS — Z1506 Genetic susceptibility to malignant neoplasm of urinary tract: Secondary | ICD-10-CM

## 2017-05-19 HISTORY — PX: FLEXIBLE SIGMOIDOSCOPY: SHX5431

## 2017-05-19 HISTORY — DX: Other complications of anesthesia, initial encounter: T88.59XA

## 2017-05-19 HISTORY — DX: Adverse effect of unspecified anesthetic, initial encounter: T41.45XA

## 2017-05-19 HISTORY — DX: Headache: R51

## 2017-05-19 HISTORY — DX: Vitamin D deficiency, unspecified: E55.9

## 2017-05-19 HISTORY — PX: ESOPHAGOGASTRODUODENOSCOPY (EGD) WITH PROPOFOL: SHX5813

## 2017-05-19 HISTORY — DX: Headache, unspecified: R51.9

## 2017-05-19 SURGERY — SIGMOIDOSCOPY, FLEXIBLE
Anesthesia: Monitor Anesthesia Care

## 2017-05-19 MED ORDER — PROPOFOL 500 MG/50ML IV EMUL
INTRAVENOUS | Status: DC | PRN
  Administered 2017-05-19: 100 ug/kg/min via INTRAVENOUS

## 2017-05-19 MED ORDER — KETAMINE HCL 10 MG/ML IJ SOLN
INTRAMUSCULAR | Status: DC | PRN
  Administered 2017-05-19: 30 mg via INTRAVENOUS

## 2017-05-19 MED ORDER — PROPOFOL 10 MG/ML IV BOLUS
INTRAVENOUS | Status: AC
  Filled 2017-05-19: qty 20

## 2017-05-19 MED ORDER — SODIUM CHLORIDE 0.9 % IV SOLN: INTRAVENOUS | Status: DC

## 2017-05-19 MED ORDER — LACTATED RINGERS IV SOLN
INTRAVENOUS | Status: DC
  Administered 2017-05-19: 1000 mL via INTRAVENOUS

## 2017-05-19 MED ORDER — PROPOFOL 10 MG/ML IV BOLUS
INTRAVENOUS | Status: AC
  Filled 2017-05-19: qty 40

## 2017-05-19 MED ORDER — PHENYLEPHRINE 40 MCG/ML (10ML) SYRINGE FOR IV PUSH (FOR BLOOD PRESSURE SUPPORT)
PREFILLED_SYRINGE | INTRAVENOUS | Status: DC | PRN
  Administered 2017-05-19: 120 ug via INTRAVENOUS

## 2017-05-19 MED ORDER — PROPOFOL 10 MG/ML IV BOLUS
INTRAVENOUS | Status: AC
Start: 2017-05-19 — End: ?
  Filled 2017-05-19: qty 40

## 2017-05-19 MED ORDER — PROPOFOL 10 MG/ML IV BOLUS
INTRAVENOUS | Status: DC | PRN
  Administered 2017-05-19: 20 mg via INTRAVENOUS
  Administered 2017-05-19: 40 mg via INTRAVENOUS
  Administered 2017-05-19 (×2): 20 mg via INTRAVENOUS
  Administered 2017-05-19: 30 mg via INTRAVENOUS
  Administered 2017-05-19: 40 mg via INTRAVENOUS
  Administered 2017-05-19: 60 mg via INTRAVENOUS
  Administered 2017-05-19: 20 mg via INTRAVENOUS
  Administered 2017-05-19: 10 mg via INTRAVENOUS

## 2017-05-19 SURGICAL SUPPLY — 15 items

## 2017-05-19 NOTE — Anesthesia Preprocedure Evaluation (Signed)
Anesthesia Evaluation  Patient identified by MRN, date of birth, ID band Patient awake    Reviewed: Allergy & Precautions, NPO status , Patient's Chart, lab work & pertinent test results  Airway Mallampati: I  TM Distance: >3 FB Neck ROM: Full    Dental  (+) Teeth Intact, Dental Advisory Given   Pulmonary COPD, Current Smoker,    breath sounds clear to auscultation       Cardiovascular negative cardio ROS   Rhythm:Regular Rate:Normal     Neuro/Psych  Headaches, PSYCHIATRIC DISORDERS Anxiety Depression    GI/Hepatic Neg liver ROS, GERD  ,  Endo/Other  negative endocrine ROS  Renal/GU negative Renal ROS  negative genitourinary   Musculoskeletal negative musculoskeletal ROS (+)   Abdominal   Peds negative pediatric ROS (+)  Hematology negative hematology ROS (+)   Anesthesia Other Findings   Reproductive/Obstetrics negative OB ROS                             Anesthesia Physical Anesthesia Plan  ASA: II  Anesthesia Plan: MAC   Post-op Pain Management:    Induction: Intravenous  PONV Risk Score and Plan: Propofol  Airway Management Planned:   Additional Equipment:   Intra-op Plan:   Post-operative Plan:   Informed Consent: I have reviewed the patients History and Physical, chart, labs and discussed the procedure including the risks, benefits and alternatives for the proposed anesthesia with the patient or authorized representative who has indicated his/her understanding and acceptance.     Plan Discussed with: CRNA  Anesthesia Plan Comments:         Anesthesia Quick Evaluation

## 2017-05-19 NOTE — Op Note (Signed)
[facility] Patient Name: Charles Santos Procedure Date: 05/19/2017 MRN: [id number] Attending MD: [name] , MD Date of Birth: 11-17-1971 CSN: [id number] Age: 46 Admit Type: Outpatient Procedure:                Flexible Sigmoidoscopy Indications:              Genetically proven [name]; s/p colon cancer                            at age 55 and then also adenocarcinoma the                            transverse colon 2017, stage II (T3 N0), status                            post subtotal colectomy 09/17/2016; rising CEA Providers:                [name], MD, Cleda Daub, RN, Tinnie Gens, Technician Referring MD:              Medicines:                Monitored Anesthesia Care Complications:            No immediate complications. Estimated blood loss:                            None. Estimated Blood Loss:     Estimated blood loss: none. Procedure:                Pre-Anesthesia Assessment:                           - Prior to the procedure, a History and Physical                            was performed, and patient medications and                            allergies were reviewed. The patient's tolerance of                            previous anesthesia was also reviewed. The risks                            and benefits of the procedure and the sedation                            options and risks were discussed with the patient.                            All questions were answered, and informed consent  was obtained. Prior Anticoagulants: The patient has                            taken no previous anticoagulant or antiplatelet                            agents. ASA Grade Assessment: III - A patient with                            severe systemic disease. After reviewing the risks                            and benefits, the patient was deemed in                            satisfactory  condition to undergo the procedure.                           After obtaining informed consent, the scope was                            passed under direct vision. The [id number] ([id number])                            scope was introduced through the anus and advanced                            to the the ileo-rectal anastomosis. The flexible                            sigmoidoscopy was accomplished without difficulty.                            The patient tolerated the procedure well. The                            quality of the bowel preparation was good. Findings:      The ileocolonic anastomosis was normal appearing, located 20cm from the       anal verge.      No polyps in remaining colon (s/p 2017 subtotal colectomy) Impression:               - Normal ileocolonic anastomosis.                           - No polyps in remaining colon s/p 2017 subtotal                            colectomy Moderate Sedation:      N/A- Per Anesthesia Care Recommendation:           - Discharge patient to home (ambulatory).                           - EGD now. Procedure Code(s):        ---  Professional ---                           [phone], Sigmoidoscopy, flexible; diagnostic,                            including collection of specimen(s) by brushing or                            washing, when performed (separate procedure) Diagnosis Code(s):        --- Professional ---                           H84.696, Personal history of other malignant                            neoplasm of large intestine CPT copyright 2016 American Medical Association. All rights reserved. The codes documented in this report are preliminary and upon coder review may  be revised to meet current compliance requirements. [name], MD 05/19/2017 11:13:03 AM This report has been signed electronically. Number of Addenda: 0

## 2017-05-19 NOTE — Transfer of Care (Signed)
Immediate Anesthesia Transfer of Care Note  Patient: Charles Santos  Procedure(s) Performed: Procedure(s): FLEXIBLE SIGMOIDOSCOPY (N/A) ESOPHAGOGASTRODUODENOSCOPY (EGD) WITH PROPOFOL (N/A)  Patient Location: ENDO  Anesthesia Type:MAC  Level of Consciousness:  sedated, patient cooperative and responds to stimulation  Airway & Oxygen Therapy:Patient Spontanous Breathing and Patient connected to face mask oxgen  Post-op Assessment:  Report given to ENDO RN and Post -op Vital signs reviewed and stable  Post vital signs:  Reviewed and stable  Last Vitals:  Vitals:   05/19/17 0821  BP: (!) 145/91  Pulse: 72  Resp: 15  Temp: 57.3 C    Complications: No apparent anesthesia complications

## 2017-05-19 NOTE — Op Note (Signed)
[facility] Patient Name: Charles Santos Procedure Date: 05/19/2017 MRN: [id number] Attending MD: [name] , MD Date of Birth: 1971/03/16 CSN: [id number] Age: 46 Admit Type: Outpatient Procedure:                Upper GI endoscopy Indications:              Surveillance for malignancy due to personal history                            of polyposis syndrome; Genetically proven Lynch                            Syndrome; s/p EGD [facility] 12/2016 Dr. Stephanie Acre for EMR of                            dudoenal adenoma (2.5cm, no HGD); rising CEA Providers:                [name], MD, Cleda Daub, RN, Tinnie Gens, Technician Referring MD:              Medicines:                Monitored Anesthesia Care Complications:            No immediate complications. Estimated blood loss:                            None. Estimated Blood Loss:     Estimated blood loss: none. Procedure:                Pre-Anesthesia Assessment:                           - Prior to the procedure, a History and Physical                            was performed, and patient medications and                            allergies were reviewed. The patient's tolerance of                            previous anesthesia was also reviewed. The risks                            and benefits of the procedure and the sedation                            options and risks were discussed with the patient.                            All questions were answered, and informed consent  was obtained. Prior Anticoagulants: The patient has                            taken no previous anticoagulant or antiplatelet                            agents. ASA Grade Assessment: III - A patient with                            severe systemic disease. After reviewing the risks                            and benefits, the patient was deemed in                            satisfactory  condition to undergo the procedure.                           After obtaining informed consent, the endoscope was                            passed under direct vision. Throughout the                            procedure, the patient's blood pressure, pulse, and                            oxygen saturations were monitored continuously. The                            [id number] ([id number]) scope was introduced through the                            mouth, and advanced to the third part of duodenum.                            The upper GI endoscopy was accomplished without                            difficulty. The patient tolerated the procedure                            well. Scope In: Scope Out: Findings:      The esophagus was normal.      Minimal inflammation characterized by congestion (edema) was found in       the entire examined stomach.      There were no recurrent polyps. The previous site of [facility] EMR polypectomy       in second duodenum was evident, no recurrence. Impression:               - Gastritis.                           - NO recurrent duodenal polyps Moderate Sedation:      N/A-  Per Anesthesia Care Recommendation:           - Patient has a contact number available for                            emergencies. The signs and symptoms of potential                            delayed complications were discussed with the                            patient. Return to normal activities tomorrow.                            Written discharge instructions were provided to the                            patient.                           - Resume previous diet.                           - Continue present medications.                           - Repeat upper endoscopy in 2 years for                            surveillance. Will also need repeat flex                            sigmoidoscopy in 1 year. Procedure Code(s):        --- Professional ---                           [phone],  Esophagogastroduodenoscopy, flexible,                            transoral; diagnostic, including collection of                            specimen(s) by brushing or washing, when performed                            (separate procedure) Diagnosis Code(s):        --- Professional ---                           K29.70, Gastritis, unspecified, without bleeding                           Z86.010, Personal history of colonic polyps CPT copyright 2016 American Medical Association. All rights reserved. The codes documented in this report are preliminary and upon coder review may  be revised to meet current compliance requirements. [name], MD 05/19/2017 11:28:55 AM This report has been signed electronically. Number of Addenda:  0 

## 2017-05-19 NOTE — Interval H&P Note (Signed)
History and Physical Interval Note:  05/19/2017 10:06 AM  Charles Santos  has presented today for surgery, with the diagnosis of lynch syndrome, h/o colon cancer/ h/o duodenal polyps, rising CEA  The various methods of treatment have been discussed with the patient and family. After consideration of risks, benefits and other options for treatment, the patient has consented to  Procedure(s): FLEXIBLE SIGMOIDOSCOPY (N/A) ESOPHAGOGASTRODUODENOSCOPY (EGD) WITH PROPOFOL (N/A) as a surgical intervention .  The patient's history has been reviewed, patient examined, no change in status, stable for surgery.  I have reviewed the patient's chart and labs.  Questions were answered to the patient's satisfaction.     [name]

## 2017-05-19 NOTE — H&P (View-Only) (Signed)
  [facility] OFFICE PROGRESS NOTE   Diagnosis: Colon cancer, hereditary non-polyposis colon cancer syndrome  INTERVAL HISTORY:   Mr. Charles Santos returns as scheduled. He has frequent bowel movements. He is working. He underwent endoscopic resection of the duodenal adenoma at [facility] on 12/21/2016. 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 No high-grade dysplasia or carcinoma was identified. He continues smoking.  Objective:  Vital signs in last 24 hours:  Blood pressure 129/67, pulse 75, resp. rate 18, height 5' 9" (1.753 m), weight 199 lb 14.4 oz (90.7 kg), SpO2 99 %.    HEENT: Neck without mass Lymphatics: No cervical, supraclavicular, or axillary nodes Resp: Lungs with scattered wheezes bilaterally, no respiratory distress Cardio: Regular rate and rhythm GI: No hepatosplenomegaly, no mass Vascular: No leg edema   Lab Results: CEA-pending  Medications: I have reviewed the patient's current medications.  Assessment/Plan: 1. Adenocarcinoma the transverse colon, stage II (T3 N0), status post subtotal colectomy 09/17/2016  2. History of right-sided colon cancer at age 46-status post a right colectomy and adjuvant 5-FU/leucovorin  3.   Hereditary non-polyposis colon cancer syndrome-MSH2 mutation  4.   Erythematous rash of the face-rosacea?  5.   Family history of colon cancer  6.   Tobacco use  7.   Anxiety/depression  8.   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 He will continue endoscopic surveillance with Dr. Jacobs. We will follow-up on the CEA from today. He will return for an office visit and CEA in 6 months. He declines an appointment with the genetics counselors.  Tykia Mellone, MD  04/19/2017  2:51  PM   

## 2017-05-19 NOTE — Discharge Instructions (Signed)

## 2017-05-20 ENCOUNTER — Encounter (HOSPITAL_COMMUNITY): Payer: Self-pay | Admitting: Gastroenterology

## 2017-05-20 NOTE — Anesthesia Postprocedure Evaluation (Addendum)
Anesthesia Post Note  Patient: Charles Santos  Procedure(s) Performed: Procedure(s) (LRB): FLEXIBLE SIGMOIDOSCOPY (N/A) ESOPHAGOGASTRODUODENOSCOPY (EGD) WITH PROPOFOL (N/A)     Patient location during evaluation: PACU Anesthesia Type: MAC Level of consciousness: awake and alert Pain management: pain level controlled Vital Signs Assessment: post-procedure vital signs reviewed and stable Respiratory status: spontaneous breathing, nonlabored ventilation, respiratory function stable and patient connected to nasal cannula oxygen Cardiovascular status: stable and blood pressure returned to baseline Anesthetic complications: no    Last Vitals:  Vitals:   05/19/17 1140 05/19/17 1150  BP: 129/78 139/87  Pulse: 76 74  Resp: 19 15  Temp:      Last Pain:  Vitals:   05/19/17 1138  TempSrc: Oral                 Effie Berkshire

## 2017-06-16 ENCOUNTER — Other Ambulatory Visit: Payer: BLUE CROSS/BLUE SHIELD

## 2017-06-17 ENCOUNTER — Other Ambulatory Visit: Payer: BLUE CROSS/BLUE SHIELD

## 2017-06-22 ENCOUNTER — Other Ambulatory Visit (HOSPITAL_BASED_OUTPATIENT_CLINIC_OR_DEPARTMENT_OTHER): Payer: BLUE CROSS/BLUE SHIELD

## 2017-06-22 ENCOUNTER — Other Ambulatory Visit (HOSPITAL_COMMUNITY)
Admission: RE | Admit: 2017-06-22 | Discharge: 2017-06-22 | Disposition: A | Discharge status: Home / Self Care | Payer: BLUE CROSS/BLUE SHIELD | Source: Ambulatory Visit | Attending: Oncology | Admitting: Oncology

## 2017-06-22 ENCOUNTER — Other Ambulatory Visit: Payer: Self-pay

## 2017-06-22 DIAGNOSIS — C184 Malignant neoplasm of transverse colon: Secondary | ICD-10-CM | POA: Insufficient documentation

## 2017-06-22 LAB — CEA (IN HOUSE-CHCC): CEA (CHCC-IN HOUSE): 11.98 ng/mL — AB (ref 0.00–5.00)

## 2017-06-27 ENCOUNTER — Telehealth: Payer: Self-pay | Admitting: *Deleted

## 2017-06-27 DIAGNOSIS — C184 Malignant neoplasm of transverse colon: Secondary | ICD-10-CM

## 2017-06-27 NOTE — Telephone Encounter (Signed)
-----   Message from Ladell Pier, MD sent at 06/24/2017  9:58 AM EDT ----- Please call patient CEA is still mildly elevated, may be due to smoking, recommend CTs chest and abd/pelvis with contrast next 1 month

## 2017-06-27 NOTE — Telephone Encounter (Signed)
Left message for pt to call office

## 2017-06-28 LAB — CYTOLOGY, URINE

## 2017-06-29 NOTE — Telephone Encounter (Signed)
-----   Message from Ladell Pier, MD sent at 06/29/2017 12:59 AM EDT ----- Please call patient, urine cytology is negative

## 2017-07-04 NOTE — Telephone Encounter (Signed)
Message from pt, requested we leave message with lab result on voicemail. Left message informing pt of MD recommendation for CT scan.  CT orders entered.

## 2017-07-18 ENCOUNTER — Ambulatory Visit: Payer: BLUE CROSS/BLUE SHIELD | Admitting: Family Medicine

## 2017-07-25 ENCOUNTER — Telehealth: Payer: Self-pay | Admitting: *Deleted

## 2017-07-25 ENCOUNTER — Encounter: Payer: Self-pay | Admitting: Family Medicine

## 2017-07-25 ENCOUNTER — Ambulatory Visit (INDEPENDENT_AMBULATORY_CARE_PROVIDER_SITE_OTHER): Payer: BLUE CROSS/BLUE SHIELD | Admitting: Family Medicine

## 2017-07-25 VITALS — BP 108/70 | HR 68 | Temp 97.0°F | Ht 69.0 in | Wt 198.8 lb

## 2017-07-25 DIAGNOSIS — F1011 Alcohol abuse, in remission: Secondary | ICD-10-CM | POA: Diagnosis not present

## 2017-07-25 DIAGNOSIS — R1011 Right upper quadrant pain: Secondary | ICD-10-CM

## 2017-07-25 DIAGNOSIS — M533 Sacrococcygeal disorders, not elsewhere classified: Secondary | ICD-10-CM | POA: Diagnosis not present

## 2017-07-25 MED ORDER — METHYLPREDNISOLONE ACETATE 80 MG/ML IJ SUSP
80.0000 mg | Freq: Once | INTRAMUSCULAR | Status: AC
  Administered 2017-07-25: 80 mg via INTRAMUSCULAR

## 2017-07-25 NOTE — Progress Notes (Signed)
   HPI  Patient presents today here with hip pain and abdominal pain.  Abdominal pain has been going on for about 3 days, patient states that's happening on the right side of the right upper and right lower quadrant, at times it's short-lived and sharp, at times it still sharp and long-standing. It's worse with standing and preceding bowel movements. There are no alleviating factors. No fever, chills, sweats. Patient has history of partial colectomy due to colon cancer, he has a short and has been stooling to 7 times a day since his surgery. This has not changed.  Right hip pain Off-and-on right-sided lateral upper hip pain, patient points at the right iliac crest whenever he is describing his pain. No injury. Worse with sitting long periods of time. Does not want to do physical therapy due to cost.  Has had a slowly rising CEA and CT chest and abdomen have been ordered by oncology.  Also some knee swelling with certain knee positions. No knee injury  PMH: Smoking status noted ROS: Per HPI  Objective: BP 108/70   Pulse 68   Temp (!) 97 F (36.1 C) (Oral)   Ht _0  (1.753 m)   Wt 198 lb 12.8 oz (90.2 kg)   BMI 29.36 kg/m  Gen: NAD, alert, cooperative with exam HEENT: NCAT CV: RRR, good S1/S2, no murmur Resp: CTABL, no wheezes, non-labored Abd: Soft, positive bowel sounds, tenderness to palpation of right lower and right upper quadrant, some epigastric tenderness as well, no guarding or rigidity Ext: No edema, warm Neuro: Alert and oriented, No gross deficits  Assessment and plan:  # Abdominal pain, right upper quadrant Right upper quadrant, right lower quadrant, epigastric pain. Given his history of colon cancer I am concerned He is also drinking about 6 beers at a time multiple times a week. Checking labs today including lipase. Recommended scheduling his CT that oncology has ordered.  # SI joint dysfunction Most likely diagnosis Given handout from sports  medicine patient advisor, cortisone injection today as well.  Alcohol abuse Patient has cut back significantly since our first meeting, however he still drinking around 6 drinks at a time on occasion. Recommended to keeping his alcohol intake to no more than 2 or 3 drinks that setting.   Orders Placed This Encounter  Procedures  . CMP14+EGFR  . CBC with Differential/Platelet  . Lipase    Meds ordered this encounter  Medications  . methylPREDNISolone acetate (DEPO-MEDROL) injection 80 mg    Laroy Apple, MD [facility] 07/25/2017, 11:57 AM

## 2017-07-25 NOTE — Patient Instructions (Signed)
Great to see you!  Try the exercises for the SI joint  Please get your CT scheduled.   PLease call with any concerns

## 2017-07-25 NOTE — Telephone Encounter (Signed)
Message from pt requesting to schedule CT. He is off 8/17, 8/22 and 8/23. Called central scheduling, requested they contact pt.

## 2017-07-26 ENCOUNTER — Telehealth: Payer: Self-pay | Admitting: Family Medicine

## 2017-07-26 LAB — CBC WITH DIFFERENTIAL/PLATELET
BASOS: 1 %
Basophils Absolute: 0.1 10*3/uL (ref 0.0–0.2)
EOS (ABSOLUTE): 0.3 10*3/uL (ref 0.0–0.4)
EOS: 3 %
HEMATOCRIT: 45.6 % (ref 37.5–51.0)
HEMOGLOBIN: 15.5 g/dL (ref 13.0–17.7)
IMMATURE GRANULOCYTES: 0 %
Immature Grans (Abs): 0 10*3/uL (ref 0.0–0.1)
LYMPHS ABS: 1.3 10*3/uL (ref 0.7–3.1)
Lymphs: 16 %
MCH: 34.8 pg — ABNORMAL HIGH (ref 26.6–33.0)
MCHC: 34 g/dL (ref 31.5–35.7)
MCV: 103 fL — AB (ref 79–97)
MONOS ABS: 0.7 10*3/uL (ref 0.1–0.9)
Monocytes: 8 %
Neutrophils Absolute: 5.9 10*3/uL (ref 1.4–7.0)
Neutrophils: 72 %
Platelets: 201 10*3/uL (ref 150–379)
RBC: 4.45 x10E6/uL (ref 4.14–5.80)
RDW: 13.5 % (ref 12.3–15.4)
WBC: 8.3 10*3/uL (ref 3.4–10.8)

## 2017-07-26 LAB — CMP14+EGFR
ALBUMIN: 3.9 g/dL (ref 3.5–5.5)
ALT: 19 IU/L (ref 0–44)
AST: 26 IU/L (ref 0–40)
Albumin/Globulin Ratio: 1.6 (ref 1.2–2.2)
Alkaline Phosphatase: 67 IU/L (ref 39–117)
BUN / CREAT RATIO: 13 (ref 9–20)
BUN: 12 mg/dL (ref 6–24)
Bilirubin Total: 0.3 mg/dL (ref 0.0–1.2)
CALCIUM: 8.9 mg/dL (ref 8.7–10.2)
CO2: 20 mmol/L (ref 20–29)
CREATININE: 0.93 mg/dL (ref 0.76–1.27)
Chloride: 103 mmol/L (ref 96–106)
GFR calc Af Amer: 114 mL/min/{1.73_m2} (ref >59)
GFR, EST NON AFRICAN AMERICAN: 99 mL/min/{1.73_m2} (ref >59)
GLOBULIN, TOTAL: 2.5 g/dL (ref 1.5–4.5)
Glucose: 82 mg/dL (ref 65–99)
Potassium: 4.2 mmol/L (ref 3.5–5.2)
SODIUM: 139 mmol/L (ref 134–144)
TOTAL PROTEIN: 6.4 g/dL (ref 6.0–8.5)

## 2017-07-26 LAB — LIPASE: Lipase: 441 U/L — ABNORMAL HIGH (ref 13–78)

## 2017-07-26 NOTE — Telephone Encounter (Signed)
Pt called back  We have discussed his elevated lipase, moderate pancreatitis. He is actually tolerating food and fluids well and states his abdominal pain is improving today. Review red flags for medical care and he will call back with any concerns.  Laroy Apple, MD [facility] 07/26/2017, 10:37 AM

## 2017-07-26 NOTE — Telephone Encounter (Signed)
Called to discuss labs  Her with a 1 day ago with abdominal pain, he is drinking heavily recently. He has lipase elevated at 441 consistent with mild to moderate pancreatitis.  I will be glad to discuss this with him when he returns the call.   Laroy Apple, MD [facility] 07/26/2017, 8:29 AM

## 2017-07-26 NOTE — Telephone Encounter (Signed)
Patient aware and spoke with Dr. Wendi Snipes.

## 2017-08-02 ENCOUNTER — Ambulatory Visit: Payer: Self-pay | Admitting: Family Medicine

## 2017-08-08 ENCOUNTER — Encounter: Payer: Self-pay | Admitting: Family Medicine

## 2017-08-08 ENCOUNTER — Ambulatory Visit (INDEPENDENT_AMBULATORY_CARE_PROVIDER_SITE_OTHER): Payer: BLUE CROSS/BLUE SHIELD | Admitting: Family Medicine

## 2017-08-08 VITALS — BP 102/53 | HR 70 | Temp 98.0°F | Ht 69.0 in | Wt 197.6 lb

## 2017-08-08 DIAGNOSIS — K852 Alcohol induced acute pancreatitis without necrosis or infection: Secondary | ICD-10-CM

## 2017-08-08 DIAGNOSIS — F329 Major depressive disorder, single episode, unspecified: Secondary | ICD-10-CM

## 2017-08-08 DIAGNOSIS — F1011 Alcohol abuse, in remission: Secondary | ICD-10-CM

## 2017-08-08 DIAGNOSIS — F32A Depression, unspecified: Secondary | ICD-10-CM

## 2017-08-08 NOTE — Progress Notes (Signed)
   HPI  Patient presents today here for follow-up of abdominal pain Patient was seen on 07/25/2017 and found to have mild to moderate pancreatitis. This is likely alcohol-related. He has stopped drinking completely since that time. He was able to tolerate fluids easily and did not want to go to the emergency room.  His depression is improving, currently is not on any medications. Last visit we discussed oncology's concern was slowly rising CEA, he will call and schedule his CT scans that have been placed by them. PMH: Smoking status noted ROS: Per HPI  Objective: BP (!) 102/53   Pulse 70   Temp 98 F (36.7 C) (Oral)   Ht 5\' 9"  (1.753 m)   Wt 197 lb 9.6 oz (89.6 kg)   BMI 29.18 kg/m  Gen: NAD, alert, cooperative with exam HEENT: NCAT, EOMI, PERRL CV: RRR, good S1/S2, no murmur Resp: CTABL, no wheezes, non-labored Abd: SNTND, BS present, no guarding or organomegaly Ext: No edema, warm Neuro: Alert and oriented, No gross deficits  Depression screen [facility] 2/9 08/08/2017 07/25/2017 05/12/2017 03/21/2017 12/31/2016  Decreased Interest 0 0 0 0 0  Down, Depressed, Hopeless 0 0 0 0 0  PHQ - 2 Score 0 0 0 0 0  Altered sleeping - - - - -  Tired, decreased energy - - - - -  Change in appetite - - - - -  Feeling bad or failure about yourself  - - - - -  Trouble concentrating - - - - -  Moving slowly or fidgety/restless - - - - -  Suicidal thoughts - - - - -  PHQ-9 Score - - - - -  Difficult doing work/chores - - - - -    Assessment and plan:  # Pancreatitis Mild, alcohol-related, pain resolved Patient tolerating food and fluids by mouth   # Depression Doing very well without medications Likely this was also related to alcohol abuse.  # Alcohol abuse, in remission Congratulated patient on his abstinence Continue to discuss   Laroy Apple, MD [facility] 08/08/2017, 4:57 PM

## 2017-08-18 ENCOUNTER — Telehealth: Payer: Self-pay

## 2017-08-18 NOTE — Telephone Encounter (Signed)
Pt called about getting his contrast for his CT. He will come at 0800 on day of CT and drink contrast at hospital. He wants Dr Benay Spice to be aware of his history of pancreatitis as he looks at the CT.

## 2017-08-23 ENCOUNTER — Ambulatory Visit (HOSPITAL_COMMUNITY): Payer: BLUE CROSS/BLUE SHIELD

## 2017-08-24 ENCOUNTER — Telehealth: Payer: Self-pay | Admitting: Oncology

## 2017-08-24 NOTE — Telephone Encounter (Signed)
lvm to inform pt of Ct scan 9/19 at 1030 per sch msg. Informed pt to pick up contrast and begin drinking first bottle at 0830 and second at 0930

## 2017-08-31 ENCOUNTER — Ambulatory Visit (HOSPITAL_COMMUNITY): Payer: BLUE CROSS/BLUE SHIELD

## 2017-09-06 ENCOUNTER — Encounter (HOSPITAL_COMMUNITY): Payer: Self-pay

## 2017-09-06 ENCOUNTER — Ambulatory Visit (HOSPITAL_COMMUNITY)
Admission: RE | Admit: 2017-09-06 | Discharge: 2017-09-06 | Disposition: A | Discharge status: Home / Self Care | Payer: BLUE CROSS/BLUE SHIELD | Source: Ambulatory Visit | Attending: Oncology | Admitting: Oncology

## 2017-09-06 DIAGNOSIS — C184 Malignant neoplasm of transverse colon: Secondary | ICD-10-CM | POA: Diagnosis not present

## 2017-09-06 MED ORDER — IOPAMIDOL (ISOVUE-300) INJECTION 61%
INTRAVENOUS | Status: AC
  Filled 2017-09-06: qty 100

## 2017-09-06 MED ORDER — IOPAMIDOL (ISOVUE-300) INJECTION 61%
100.0000 mL | Freq: Once | INTRAVENOUS | Status: AC | PRN
  Administered 2017-09-06: 100 mL via INTRAVENOUS

## 2017-09-07 ENCOUNTER — Telehealth: Payer: Self-pay

## 2017-09-07 NOTE — Telephone Encounter (Signed)
Left VM asking patient to call back

## 2017-09-07 NOTE — Telephone Encounter (Signed)
-----   Message from Ladell Pier, MD sent at 09/06/2017  5:18 PM EDT ----- Please call patient, CTs are negative for cancer, f/u as scheduled for office visit and repeat CEA

## 2017-10-20 ENCOUNTER — Other Ambulatory Visit: Payer: BLUE CROSS/BLUE SHIELD

## 2017-10-20 ENCOUNTER — Ambulatory Visit: Payer: BLUE CROSS/BLUE SHIELD | Admitting: Oncology

## 2017-11-18 ENCOUNTER — Encounter: Payer: Self-pay | Admitting: Nurse Practitioner

## 2017-11-18 ENCOUNTER — Ambulatory Visit (INDEPENDENT_AMBULATORY_CARE_PROVIDER_SITE_OTHER): Payer: BLUE CROSS/BLUE SHIELD

## 2017-11-18 ENCOUNTER — Ambulatory Visit: Payer: BLUE CROSS/BLUE SHIELD | Admitting: Nurse Practitioner

## 2017-11-18 VITALS — BP 129/78 | HR 87 | Temp 98.2°F | Ht 69.0 in | Wt 200.0 lb

## 2017-11-18 DIAGNOSIS — M25562 Pain in left knee: Secondary | ICD-10-CM | POA: Diagnosis not present

## 2017-11-18 DIAGNOSIS — J069 Acute upper respiratory infection, unspecified: Secondary | ICD-10-CM

## 2017-11-18 MED ORDER — NAPROXEN 500 MG PO TABS: 500.0000 mg | ORAL_TABLET | Freq: Two times a day (BID) | ORAL | 1 refills | Status: DC

## 2017-11-18 MED ORDER — AZITHROMYCIN 250 MG PO TABS: ORAL_TABLET | ORAL | 0 refills | Status: DC

## 2017-11-18 NOTE — Patient Instructions (Signed)

## 2017-11-18 NOTE — Progress Notes (Signed)
Subjective:    Patient ID: Charles Santos, male    DOB: May 14, 1971, 46 y.o.   MRN: [id number]  HPI  Patient comes in today with 2 complaints: - patient c/o cough and congestion for >3 weeks. denies fever. OTC meds are giving him no relief.  - left knee pain. Started about 2 weeks ago and is getting worse. Rates pain 2/10 currently. stretching leg out eases pain. Bending or going upo and down stairs increases pain.   Review of Systems  Constitutional: Negative for appetite change, chills and fever.  HENT: Positive for congestion, rhinorrhea and sinus pressure. Negative for sore throat, trouble swallowing and voice change.   Respiratory: Positive for cough (productive greenish).   Cardiovascular: Negative.   Genitourinary: Negative.   Musculoskeletal: Positive for arthralgias (left knee).  Neurological: Negative.   Psychiatric/Behavioral: Negative.   All other systems reviewed and are negative.      Objective:   Physical Exam  Constitutional: He is oriented to person, place, and time. He appears well-developed and well-nourished. No distress (mild).  HENT:  Right Ear: Hearing, tympanic membrane, external ear and ear canal normal.  Left Ear: Hearing, tympanic membrane, external ear and ear canal normal.  Nose: Mucosal edema and rhinorrhea present. Right sinus exhibits no maxillary sinus tenderness and no frontal sinus tenderness. Left sinus exhibits no maxillary sinus tenderness and no frontal sinus tenderness.  Mouth/Throat: Uvula is midline and mucous membranes are normal. Posterior oropharyngeal erythema present.  Eyes: Conjunctivae are normal. Pupils are equal, round, and reactive to light.  Neck: Normal range of motion. Neck supple.  Cardiovascular: Normal rate, regular rhythm and normal heart sounds.  Pulmonary/Chest: Effort normal. He has wheezes (inspiratory ).  Musculoskeletal: Normal range of motion.  Mild effusion left knee' No patella tenderness FROM with pain on flexion   Neurological: He is alert and oriented to person, place, and time.  Skin: Skin is warm.  Psychiatric: He has a normal mood and affect. His behavior is normal. Judgment and thought content normal.   BP 129/78   Pulse 87   Temp 98.2 F (36.8 C) (Oral)   Ht 5\' 9"  (1.753 m)   Wt 200 lb (90.7 kg)   BMI 29.53 kg/m   Knee xray- no acute findings-Preliminary reading by Ronnald Collum, FNP  [facility]     Assessment & Plan:  1. Acute pain of left knee Rest ice bid  Compression wrap Elevate when sitting - DG Knee 1-2 Views Left; Future - naproxen (NAPROSYN) 500 MG tablet; Take 1 tablet (500 mg total) by mouth 2 (two) times daily with a meal.  Dispense: 60 tablet; Refill: 1  2. Upper respiratory infection with cough and congestion 1. Take meds as prescribed 2. Use a cool mist humidifier especially during the winter months and when heat has been humid. 3. Use saline nose sprays frequently 4. Saline irrigations of the nose can be very helpful if done frequently.  * 4X daily for 1 week*  * Use of a nettie pot can be helpful with this. Follow directions with this* 5. Drink plenty of fluids 6. Keep thermostat turn down low 7.For any cough or congestion  Use plain Mucinex- regular strength or max strength is fine   * Children- consult with Pharmacist for dosing 8. For fever or aces or pains- take tylenol or ibuprofen appropriate for age and weight.  * for fevers greater than 101 orally you may alternate ibuprofen and tylenol every  3 hours.    -  azithromycin (ZITHROMAX Z-PAK) 250 MG tablet; As directed  Dispense: 6 tablet; Refill: 0  Mary-Margaret Hassell Done, FNP

## 2018-01-19 ENCOUNTER — Ambulatory Visit (INDEPENDENT_AMBULATORY_CARE_PROVIDER_SITE_OTHER): Payer: BLUE CROSS/BLUE SHIELD | Admitting: Family Medicine

## 2018-01-19 ENCOUNTER — Encounter: Payer: Self-pay | Admitting: Family Medicine

## 2018-01-19 VITALS — BP 125/84 | HR 83 | Temp 99.3°F | Ht 69.0 in | Wt 194.0 lb

## 2018-01-19 DIAGNOSIS — Z0000 Encounter for general adult medical examination without abnormal findings: Secondary | ICD-10-CM | POA: Diagnosis not present

## 2018-01-19 DIAGNOSIS — F419 Anxiety disorder, unspecified: Secondary | ICD-10-CM

## 2018-01-19 MED ORDER — QUETIAPINE FUMARATE 100 MG PO TABS: 100.0000 mg | ORAL_TABLET | Freq: Every day | ORAL | 0 refills | Status: DC

## 2018-01-19 NOTE — Progress Notes (Signed)
   HPI  Patient presents today here for an annual physical exam and abdominal pain.  Patient states he had right-sided lower abdominal pain last week it lasted 3 or 4 days and improved when he slowed down drinking alcohol. He states that he also increased his water intake.  States that it has completely resolved currently.  Patient feels well overall, however he struggling with some depression and difficulty sleeping.  He states that he drinks alcohol primarily to sleep.  At night he has very vivid dreams. He has a history of depression and did pretty well with citalopram previously which he stopped after he started feeling better.  PMH: Smoking status noted ROS: Per HPI  Objective: BP 125/84   Pulse 83   Temp 99.3 F (37.4 C) (Oral)   Ht '5\' 9"'$  (1.753 m)   Wt 194 lb (88 kg)   BMI 28.65 kg/m  Gen: NAD, alert, cooperative with exam HEENT: NCAT CV: RRR, good S1/S2, no murmur Resp: CTABL, no wheezes, non-labored Ext: No edema, warm Neuro: Alert and oriented, No gross deficits  Assessment and plan:  #Annual physical exam Normal exam, slightly overweight Labs nonfasting today Recommended follow-up with oncology as his primary concern is relapse of cancer  #Anxiety With difficulty sleeping and alcohol abuse. Trial of Seroquel, has very vivid dreams  His depression is less prominent now than it was previously    Orders Placed This Encounter  Procedures  . CMP14+EGFR  . CBC with Differential/Platelet  . Lipid panel  . TSH  . Lipase     Laroy Apple, MD [facility] 01/19/2018, 3:28 PM

## 2018-01-19 NOTE — Patient Instructions (Signed)
Great to see you!  Heart with 1/2 tablet of Seroquel every night for about 5 days then increase to 1 pill, then 1.5 pills then 2 pills. If you are sleeping good with 1 pill at night just continue on only 1 pill daily.

## 2018-01-20 LAB — CBC WITH DIFFERENTIAL/PLATELET
BASOS: 1 %
Basophils Absolute: 0.1 10*3/uL (ref 0.0–0.2)
EOS (ABSOLUTE): 0.2 10*3/uL (ref 0.0–0.4)
EOS: 3 %
HEMATOCRIT: 49.4 % (ref 37.5–51.0)
HEMOGLOBIN: 17.2 g/dL (ref 13.0–17.7)
IMMATURE GRANS (ABS): 0 10*3/uL (ref 0.0–0.1)
Immature Granulocytes: 1 %
Lymphocytes Absolute: 2 10*3/uL (ref 0.7–3.1)
Lymphs: 30 %
MCH: 34.5 pg — ABNORMAL HIGH (ref 26.6–33.0)
MCHC: 34.8 g/dL (ref 31.5–35.7)
MCV: 99 fL — AB (ref 79–97)
MONOCYTES: 9 %
MONOS ABS: 0.6 10*3/uL (ref 0.1–0.9)
NEUTROS PCT: 56 %
Neutrophils Absolute: 3.8 10*3/uL (ref 1.4–7.0)
Platelets: 162 10*3/uL (ref 150–379)
RBC: 4.99 x10E6/uL (ref 4.14–5.80)
RDW: 13.5 % (ref 12.3–15.4)
WBC: 6.7 10*3/uL (ref 3.4–10.8)

## 2018-01-20 LAB — LIPID PANEL
CHOL/HDL RATIO: 3.2 ratio (ref 0.0–5.0)
Cholesterol, Total: 130 mg/dL (ref 100–199)
HDL: 41 mg/dL (ref >39)
LDL CALC: 13 mg/dL (ref 0–99)
TRIGLYCERIDES: 379 mg/dL — AB (ref 0–149)
VLDL CHOLESTEROL CAL: 76 mg/dL — AB (ref 5–40)

## 2018-01-20 LAB — LIPASE: Lipase: 81 U/L — ABNORMAL HIGH (ref 13–78)

## 2018-01-20 LAB — CMP14+EGFR
ALBUMIN: 3.9 g/dL (ref 3.5–5.5)
ALT: 27 IU/L (ref 0–44)
AST: 31 IU/L (ref 0–40)
Albumin/Globulin Ratio: 1.4 (ref 1.2–2.2)
Alkaline Phosphatase: 65 IU/L (ref 39–117)
BUN / CREAT RATIO: 17 (ref 9–20)
BUN: 17 mg/dL (ref 6–24)
Bilirubin Total: 0.5 mg/dL (ref 0.0–1.2)
CALCIUM: 9.1 mg/dL (ref 8.7–10.2)
CO2: 23 mmol/L (ref 20–29)
CREATININE: 1.01 mg/dL (ref 0.76–1.27)
Chloride: 101 mmol/L (ref 96–106)
GFR calc Af Amer: 103 mL/min/{1.73_m2} (ref >59)
GFR, EST NON AFRICAN AMERICAN: 89 mL/min/{1.73_m2} (ref >59)
Globulin, Total: 2.8 g/dL (ref 1.5–4.5)
Glucose: 82 mg/dL (ref 65–99)
Potassium: 4.2 mmol/L (ref 3.5–5.2)
SODIUM: 140 mmol/L (ref 134–144)
Total Protein: 6.7 g/dL (ref 6.0–8.5)

## 2018-01-20 LAB — TSH: TSH: 0.783 u[IU]/mL (ref 0.450–4.500)

## 2018-01-27 ENCOUNTER — Encounter: Payer: Self-pay | Admitting: Gastroenterology

## 2018-01-27 NOTE — Telephone Encounter (Signed)
Error

## 2018-02-09 ENCOUNTER — Ambulatory Visit: Payer: BLUE CROSS/BLUE SHIELD | Admitting: Family Medicine

## 2018-02-10 ENCOUNTER — Ambulatory Visit: Payer: Self-pay | Admitting: Family Medicine

## 2018-02-16 ENCOUNTER — Ambulatory Visit: Payer: BLUE CROSS/BLUE SHIELD | Admitting: Family Medicine

## 2018-02-16 ENCOUNTER — Encounter: Payer: Self-pay | Admitting: Family Medicine

## 2018-02-16 VITALS — BP 137/83 | HR 87 | Temp 98.4°F | Ht 69.0 in | Wt 194.2 lb

## 2018-02-16 DIAGNOSIS — F419 Anxiety disorder, unspecified: Secondary | ICD-10-CM | POA: Diagnosis not present

## 2018-02-16 DIAGNOSIS — F1011 Alcohol abuse, in remission: Secondary | ICD-10-CM

## 2018-02-16 MED ORDER — QUETIAPINE FUMARATE 100 MG PO TABS: 100.0000 mg | ORAL_TABLET | Freq: Every day | ORAL | 2 refills | Status: DC

## 2018-02-16 NOTE — Progress Notes (Signed)
   HPI  Patient presents today here for follow-up anxiety, difficulty sleeping, alcohol abuse.  Patient states that Seroquel is been very helpful, he is taking 1 pill at night sleeping well.  He states that his dreams are less vivid and that his anxiety is easier to deal with. As a unexpected benefit he has had a reduced number of stools per day daily now stooling only 3-4 times a day.  If that is not drinking more than 3 or 4 beers a week.  PMH: Smoking status noted ROS: Per HPI  Objective: BP 137/83   Pulse 87   Temp 98.4 F (36.9 C) (Oral)   Ht 5\' 9"  (1.753 m)   Wt 194 lb 3.2 oz (88.1 kg)   BMI 28.68 kg/m  Gen: NAD, alert, cooperative with exam HEENT: NCAT CV: RRR, good S1/S2, no murmur Resp: CTABL, no wheezes, non-labored Ext: No edema, warm Neuro: Alert and oriented, No gross deficits  Assessment and plan:  #Anxiety With prominent difficulty sleeping, improved with Seroquel Refill Seroquel 100 mg once nightly. Follow-up 3 months  #Alcohol abuse-in remission Congratulated on his success    Meds ordered this encounter  Medications  . QUEtiapine (SEROQUEL) 100 MG tablet    Sig: Take 1 tablet (100 mg total) by mouth at bedtime.    Dispense:  90 tablet    Refill:  [place], MD [facility] 02/16/2018, 2:04 PM

## 2018-04-04 ENCOUNTER — Ambulatory Visit: Payer: BLUE CROSS/BLUE SHIELD | Admitting: Family Medicine

## 2018-04-04 ENCOUNTER — Encounter: Payer: Self-pay | Admitting: Family Medicine

## 2018-04-04 VITALS — BP 138/81 | HR 81 | Temp 97.6°F | Ht 69.0 in | Wt 196.2 lb

## 2018-04-04 DIAGNOSIS — R079 Chest pain, unspecified: Secondary | ICD-10-CM | POA: Diagnosis not present

## 2018-04-04 DIAGNOSIS — M7712 Lateral epicondylitis, left elbow: Secondary | ICD-10-CM

## 2018-04-04 MED ORDER — TRIAMCINOLONE ACETONIDE 40 MG/ML IJ SUSP
40.0000 mg | Freq: Once | INTRAMUSCULAR | Status: AC
Start: 2018-04-04 — End: 2018-04-04
  Administered 2018-04-04: 40 mg via INTRAMUSCULAR

## 2018-04-04 NOTE — Progress Notes (Signed)
   HPI  Patient presents today with left elbow pain, chest pain.  Patient explains that he has had left elbow pain for about 3 weeks.  It hurts worse with exercise. He hurts around the left lateral epicondyle  Right-sided low back pain With radiation down his posterior leg, intermittent  Chest pain Central chest heaviness, came on for approximately 30 minutes while he laid down at night, he has not had a recurrent episode since that time, this happened about 1 week ago. States it felt like he was "having a heart attack". No additional chest pain since that time, patient works at a local [place] and has been able to work without any chest pain or shortness of breath.  PMH: Smoking status noted ROS: Per HPI  Objective: BP 138/81   Pulse 81   Temp 97.6 F (36.4 C) (Oral)   Ht 5\' 9"  (1.753 m)   Wt 196 lb 3.2 oz (89 kg)   BMI 28.97 kg/m  Gen: NAD, alert, cooperative with exam HEENT: NCAT, EOMI, PERRL CV: RRR, good S1/S2, no murmur Resp: CTABL, no wheezes, non-labored Abd: SNTND, BS present, no guarding or organomegaly Ext: No edema, warm Neuro: Alert and oriented, No gross deficits  Assessment and plan:  #Chest pain, atypical - new problem Patient with one episode of chest pain, he is done vigorous exercise since that time without reproduction of pain. EKG today shows incomplete right bundle, Risk factors include smoking, hyperlipidemia ( mainly trigs however) Recommended Cardiology, he would like to watch and wait  #Tennis elbow - new problem Exercises recommended, patient also has some right sided sciatica right now Given IM Kenalog, patient was last given Depo-Medrol 6 months ago    Orders Placed This Encounter  Procedures  . EKG 12-Lead     Laroy Apple, MD [facility] 04/04/2018, 3:33 PM

## 2018-04-11 ENCOUNTER — Encounter: Payer: Self-pay | Admitting: Gastroenterology

## 2018-05-23 ENCOUNTER — Ambulatory Visit: Payer: BLUE CROSS/BLUE SHIELD | Admitting: Family Medicine

## 2018-05-25 ENCOUNTER — Ambulatory Visit (INDEPENDENT_AMBULATORY_CARE_PROVIDER_SITE_OTHER): Payer: BLUE CROSS/BLUE SHIELD | Admitting: Family Medicine

## 2018-05-25 ENCOUNTER — Ambulatory Visit (INDEPENDENT_AMBULATORY_CARE_PROVIDER_SITE_OTHER): Payer: BLUE CROSS/BLUE SHIELD

## 2018-05-25 ENCOUNTER — Encounter: Payer: Self-pay | Admitting: Family Medicine

## 2018-05-25 VITALS — BP 121/73 | HR 74 | Temp 97.5°F | Ht 69.0 in | Wt 190.8 lb

## 2018-05-25 DIAGNOSIS — M5441 Lumbago with sciatica, right side: Secondary | ICD-10-CM | POA: Diagnosis not present

## 2018-05-25 DIAGNOSIS — R1031 Right lower quadrant pain: Secondary | ICD-10-CM | POA: Diagnosis not present

## 2018-05-25 DIAGNOSIS — R059 Cough, unspecified: Secondary | ICD-10-CM

## 2018-05-25 DIAGNOSIS — M5442 Lumbago with sciatica, left side: Secondary | ICD-10-CM | POA: Diagnosis not present

## 2018-05-25 DIAGNOSIS — R05 Cough: Secondary | ICD-10-CM | POA: Diagnosis not present

## 2018-05-25 MED ORDER — AZITHROMYCIN 250 MG PO TABS: ORAL_TABLET | ORAL | 0 refills | Status: DC

## 2018-05-25 NOTE — Progress Notes (Signed)
   HPI  Patient presents today here for follow-up chronic medical conditions, cough, abdominal pain, and back pain.  Cough 2 weeks or so of cough and sneezing, mild shortness of breath. He is tolerating food and fluids No fever chills or sweats. Early on had sinus pain and pressure which has resolved. Using over-the-counter Alka-Seltzer flu and some improvement and now has second sickness over the last 3 to 4 days.  Abdominal pain Described as discomfort in the right mid abdomen, worse before he has good bowel movement, has improvement with bowel movements Has not followed up with oncology.  Back pain Midline low back pain worsening with standing or at work.  Patient has radiation down bilateral posterior legs to the popliteal fossa. Alka-Seltzer flu actually helped his pain a bit  PMH: Smoking status noted ROS: Per HPI  Objective: BP 121/73   Pulse 74   Temp (!) 97.5 F (36.4 C) (Oral)   Ht _0  (1.753 m)   Wt 190 lb 12.8 oz (86.5 kg)   BMI 28.18 kg/m  Gen: NAD, alert, cooperative with exam HEENT: NCAT CV: RRR, good S1/S2, no murmur Resp: Nonlabored, good air movement, wheezes scattered Ext: No edema, warm Neuro: Alert and oriented, No gross deficits  Assessment and plan:  #Cough Patient with persistent cough for about 2 weeks now, had some improvement and now has second sickness Azithromycin given  #Abdominal pain Discomfort in the right midabdomen, worse before bowel movement improved afterwards. His abdominal exam is reassuring, his lipase was previously borderline elevated and he has struggled with alcohol.  We discussed this today. Also his CEA was rising at his previous oncology appointments, encouraged him to call for an appointment  #Midline low back pain with sciatica Symptoms persistent now for at least 3 months.  He has had some mild improvement with Tylenol (as part of Alka-Seltzer cold and flu) Recommended Tylenol as needed Plain film  today Considering history of GI cancer would keep a very low threshold for pursuing MR lumbar spine   Orders Placed This Encounter  Procedures  . DG Lumbar Spine 2-3 Views    Standing Status:   Future    Standing Expiration Date:   07/25/2019    Order Specific Question:   Reason for Exam (SYMPTOM  OR DIAGNOSIS REQUIRED)    Answer:   lpow back pain with Hx of Colon Ca/lynch syndrome, BL sciatica    Order Specific Question:   Preferred imaging location?    Answer:   Internal  . CMP14+EGFR  . Lipase    Meds ordered this encounter  Medications  . azithromycin (ZITHROMAX) 250 MG tablet    Sig: Take 2 tablets on day 1 and 1 tablet daily after that    Dispense:  6 tablet    Refill:  0    Laroy Apple, MD [facility] 05/25/2018, 1:32 PM

## 2018-05-25 NOTE — Patient Instructions (Signed)
Great to see you!  See Particia Nearing in 3 months

## 2018-05-26 LAB — CMP14+EGFR
ALT: 33 IU/L (ref 0–44)
AST: 26 IU/L (ref 0–40)
Albumin/Globulin Ratio: 1.8 (ref 1.2–2.2)
Albumin: 4.4 g/dL (ref 3.5–5.5)
Alkaline Phosphatase: 81 IU/L (ref 39–117)
BUN / CREAT RATIO: 13 (ref 9–20)
BUN: 13 mg/dL (ref 6–24)
Bilirubin Total: 0.5 mg/dL (ref 0.0–1.2)
CALCIUM: 9.6 mg/dL (ref 8.7–10.2)
CO2: 23 mmol/L (ref 20–29)
CREATININE: 1.02 mg/dL (ref 0.76–1.27)
Chloride: 105 mmol/L (ref 96–106)
GFR calc Af Amer: 102 mL/min/{1.73_m2} (ref >59)
GFR, EST NON AFRICAN AMERICAN: 88 mL/min/{1.73_m2} (ref >59)
Globulin, Total: 2.4 g/dL (ref 1.5–4.5)
Glucose: 75 mg/dL (ref 65–99)
Potassium: 4.8 mmol/L (ref 3.5–5.2)
Sodium: 143 mmol/L (ref 134–144)
TOTAL PROTEIN: 6.8 g/dL (ref 6.0–8.5)

## 2018-05-26 LAB — LIPASE: Lipase: 89 U/L — ABNORMAL HIGH (ref 13–78)

## 2018-05-29 ENCOUNTER — Telehealth: Payer: Self-pay | Admitting: Gastroenterology

## 2018-05-29 ENCOUNTER — Telehealth: Payer: Self-pay | Admitting: Family Medicine

## 2018-05-29 NOTE — Telephone Encounter (Signed)
Aware and will call GI for further work up

## 2018-05-29 NOTE — Telephone Encounter (Signed)
Patient states he had lab work done by pcp recently and was told it was elevated and to check with his gi doctor. Patient wants to know if Dr.Jacobs can review labs and advise what to do next.

## 2018-05-29 NOTE — Telephone Encounter (Signed)
His lipase is very mildly elevated.  Doubt that this is of any clinical significance but given his [name] I want to see him for nexgt available rov to discuss this further.  Thanks

## 2018-05-29 NOTE — Telephone Encounter (Signed)
Dr. [name],  Will you please see the question from the patient and recent elevated lipase and advise

## 2018-05-30 NOTE — Telephone Encounter (Signed)
Patient notified He is scheduled for 08/11/18

## 2018-08-11 ENCOUNTER — Encounter: Payer: Self-pay | Admitting: Gastroenterology

## 2018-08-11 ENCOUNTER — Ambulatory Visit: Payer: BLUE CROSS/BLUE SHIELD | Admitting: Gastroenterology

## 2018-08-11 ENCOUNTER — Encounter (INDEPENDENT_AMBULATORY_CARE_PROVIDER_SITE_OTHER): Payer: Self-pay

## 2018-08-11 VITALS — BP 112/68 | HR 64 | Ht 70.0 in | Wt 196.0 lb

## 2018-08-11 DIAGNOSIS — R748 Abnormal levels of other serum enzymes: Secondary | ICD-10-CM

## 2018-08-11 DIAGNOSIS — Z1509 Genetic susceptibility to other malignant neoplasm: Secondary | ICD-10-CM

## 2018-08-11 NOTE — Patient Instructions (Addendum)
Flex sigmoidoscopy for lynch syndrome. [facility]. MRI abdomen pancreas protocol for elevated lipase, lynch syndrome.  You have been scheduled for an MRI at [facility] on 08/23/18. Your appointment time is 9am. Please arrive 30 minutes prior to your appointment time for registration purposes. Please make certain not to have anything to eat or drink 6 hours prior to your test. In addition, if you have any metal in your body, have a pacemaker or defibrillator, please be sure to let your ordering physician know. This test typically takes 45 minutes to 1 hour to complete. Should you need to reschedule, please call [phone] to do so.  Normal BMI (Body Mass Index- based on height and weight) is between 19 and 25. Your BMI today is Body mass index is 28.12 kg/m. Marland Kitchen Please consider follow up  regarding your BMI with your Primary Care Provider.  Thank you for entrusting me with your care and choosing [facility].  Dr [name]

## 2018-08-11 NOTE — Progress Notes (Signed)
Review of pertinent gastrointestinal problems: 1. [name]: Genetically proven [name]; s/p EGD [facility] 12/2016 Dr. Stephanie Acre for EMR of dudoenal adenoma (2.5cm, no HGD); s/p colon cancer at age 47 and then alsoadenocarcinoma the transverse colon 2017, stage II (T3 N0), status post subtotal colectomy 09/17/2016;  Flexible sigmoidoscopy 05/2017 [name]; normal IC anastomosis at 20cm; repeat at 1 year  EGD 05/2017: minor gastritis, [facility] EMR duodenal site looked normal; repeat at 2 years   HPI: This is a very pleasant 47 year old man whom I last saw around the time of flexible sigmoidoscopy little over a year ago. He was sent a reminder letter for his flexible sigmoidoscopy in April.  We did not get back from him.  Lipase levels over the past year are intermittently elevated.  One year ago 441, 2 months ago 26, 6 months ago 81.  He has intermittent abdominal pains as well.  He is weight has been overall stable.  No overt GI bleeding.  No definite pancreas cancer in the family.  Chief complaint is [name], elevated pancreatic enzyme  ROS: complete GI ROS as described in HPI, all other review negative.  Constitutional:  No unintentional weight loss   Past Medical History:  Diagnosis Date  . Anxiety   . Cancer of transverse colon ([place]) 09/17/2016   surgery done  . Colon cancer [facility]) 1998   surgery and chemo  . Complication of anesthesia    not fully under for polyp removal at [place] 2017  . Depression   . Emphysema of lung ([place])   . Family hx of colon cancer   . GERD (gastroesophageal reflux disease)    none recent  . Headache   . Hyperlipidemia   . Vitamin D deficiency     Past Surgical History:  Procedure Laterality Date  . APPENDECTOMY    . colon resectomy  1998   2 degree herida  . COLONOSCOPY    . ESOPHAGOGASTRODUODENOSCOPY (EGD) WITH PROPOFOL N/A 05/19/2017   Procedure: ESOPHAGOGASTRODUODENOSCOPY (EGD) WITH PROPOFOL;  Surgeon: [name], MD;   [facility];  Service: Endoscopy;  Laterality: N/A;  . FLEXIBLE SIGMOIDOSCOPY N/A 05/19/2017   Procedure: FLEXIBLE SIGMOIDOSCOPY;  Surgeon: [name], MD;  [facility]: [facility];  Service: Endoscopy;  Laterality: N/A;  . INGUINAL HERNIA REPAIR    . LAPAROSCOPIC SUBTOTAL COLECTOMY N/A 09/17/2016   Procedure: DIAGNOSTIC LAPAROSCOPY EXPLORATORY LAPAROTOMY SUBTOTAL COLECTOMY PROCTOSCOPY;  Surgeon: [name], MD;  Location: [facility];  Service: General;  Laterality: N/A;  . polyp removal at [place]  2017  . PROCTOSCOPY  09/17/2016   Procedure: PROCTOSCOPY;  Surgeon: [name], MD;  Location: [facility];  Service: General;;  . UPPER GI ENDOSCOPY      Current Outpatient Medications  Medication Sig Dispense Refill  . Acetaminophen (TYLENOL PO) Take 2 tablets by mouth every 8 (eight) hours as needed (pain).    . naproxen sodium (ALEVE) 220 MG tablet Take 220 mg by mouth daily as needed.    Marland Kitchen QUEtiapine (SEROQUEL) 100 MG tablet Take 1 tablet (100 mg total) by mouth at bedtime. 90 tablet 2   No current facility-administered medications for this visit.     Allergies as of 08/11/2018 - Review Complete 08/11/2018  Allergen Reaction Noted  . Adhesive [tape] Rash 05/16/2017    Family History  Problem Relation Age of Onset  . Colon cancer Father        pt thinks he was in his 27's when dx  .  Esophageal cancer Neg Hx   . Rectal cancer Neg Hx   . Stomach cancer Neg Hx   . Prostate cancer Neg Hx     Social History   Socioeconomic History  . Marital status: Legally Separated    Spouse name: Not on file  . Number of children: Not on file  . Years of education: Not on file  . Highest education level: Not on file  Occupational History  . Not on file  Social Needs  . Financial resource strain: Not on file  . Food insecurity:    Worry: Not on file    Inability: Not on file  . Transportation needs:    Medical: Not on file    Non-medical: Not on file  Tobacco Use  .  Smoking status: Current Every Day Smoker    Packs/day: 1.25    Years: 23.00    Pack years: 28.75    Types: Cigarettes  . Smokeless tobacco: Never Used  Substance and Sexual Activity  . Alcohol use: Yes    Alcohol/week: 68.0 standard drinks    Types: 12 Cans of beer, 56 Standard drinks or equivalent per week    Comment: occ  . Drug use: No  . Sexual activity: Not on file  Lifestyle  . Physical activity:    Days per week: Not on file    Minutes per session: Not on file  . Stress: Not on file  Relationships  . Social connections:    Talks on phone: Not on file    Gets together: Not on file    Attends religious service: Not on file    Active member of club or organization: Not on file    Attends meetings of clubs or organizations: Not on file    Relationship status: Not on file  . Intimate partner violence:    Fear of current or ex partner: Not on file    Emotionally abused: Not on file    Physically abused: Not on file    Forced sexual activity: Not on file  Other Topics Concern  . Not on file  Social History Narrative  . Not on file     Physical Exam: BP 112/68   Pulse 64   Ht 5\' 10"  (1.778 m)   Wt 196 lb (88.9 kg)   BMI 28.12 kg/m  Constitutional: Chronically ill-appearing Psychiatric: alert and oriented x3 Abdomen: soft, nontender, nondistended, no obvious ascites, no peritoneal signs, normal bowel sounds No peripheral edema noted in lower extremities  Assessment and plan: 47 y.o. male with [name], elevated pancreatic enzymes, chronic intermittent abdominal pains.  [name] puts him at somewhat increased risk for pancreatic cancer.  He does not have a family history of pancreatic cancer fortunately.  Given his elevated pancreatic enzymes and his genetic predisposition I am going to order an MRI of his abdomen pancreatic protocol.  He did not respond to a reminder we sent him earlier this year about repeat flexible sigmoidoscopy for high risk colon  cancer screening, [name] surveillance.  We will arrange for that flexible sigmoidoscopy in the next few weeks since he is here in the office now.  Please see the "Patient Instructions" section for addition details about the plan.  Owens Loffler, MD [facility] 08/11/2018, 10:14 AM

## 2018-08-23 ENCOUNTER — Ambulatory Visit (HOSPITAL_COMMUNITY)
Admission: RE | Admit: 2018-08-23 | Discharge: 2018-08-23 | Disposition: A | Discharge status: Home / Self Care | Payer: BLUE CROSS/BLUE SHIELD | Source: Ambulatory Visit | Attending: Gastroenterology | Admitting: Gastroenterology

## 2018-08-23 ENCOUNTER — Other Ambulatory Visit: Payer: Self-pay | Admitting: Gastroenterology

## 2018-08-23 DIAGNOSIS — Z1509 Genetic susceptibility to other malignant neoplasm: Secondary | ICD-10-CM | POA: Diagnosis not present

## 2018-08-23 DIAGNOSIS — R748 Abnormal levels of other serum enzymes: Secondary | ICD-10-CM | POA: Insufficient documentation

## 2018-08-23 DIAGNOSIS — Q453 Other congenital malformations of pancreas and pancreatic duct: Secondary | ICD-10-CM | POA: Insufficient documentation

## 2018-08-23 MED ORDER — GADOBUTROL 1 MMOL/ML IV SOLN
9.0000 mL | Freq: Once | INTRAVENOUS | Status: AC | PRN
  Administered 2018-08-23: 9 mL via INTRAVENOUS

## 2018-09-19 ENCOUNTER — Encounter: Payer: Self-pay | Admitting: Gastroenterology

## 2018-09-29 ENCOUNTER — Encounter: Payer: Self-pay | Admitting: Gastroenterology

## 2018-09-29 ENCOUNTER — Ambulatory Visit (AMBULATORY_SURGERY_CENTER): Payer: Managed Care, Other (non HMO) | Admitting: Gastroenterology

## 2018-09-29 ENCOUNTER — Telehealth: Payer: Self-pay

## 2018-09-29 VITALS — BP 127/72 | HR 75 | Temp 98.6°F | Resp 21 | Ht 70.0 in | Wt 196.0 lb

## 2018-09-29 DIAGNOSIS — Z1509 Genetic susceptibility to other malignant neoplasm: Secondary | ICD-10-CM | POA: Diagnosis not present

## 2018-09-29 MED ORDER — FLEET ENEMA 7-19 GM/118ML RE ENEM
1.0000 | ENEMA | Freq: Once | RECTAL | Status: AC
  Administered 2018-09-29: 1 via RECTAL

## 2018-09-29 MED ORDER — SODIUM CHLORIDE 0.9 % IV SOLN: 500.0000 mL | Freq: Once | INTRAVENOUS | Status: DC

## 2018-09-29 NOTE — Progress Notes (Signed)
To PACU, VSS. Report to [email or web address] 

## 2018-09-29 NOTE — Patient Instructions (Signed)
YOU HAD AN ENDOSCOPIC PROCEDURE TODAY AT THE [facility]:   Refer to the procedure report that was given to you for any specific questions about what was found during the examination.  If the procedure report does not answer your questions, please call your gastroenterologist to clarify.  If you requested that your care partner not be given the details of your procedure findings, then the procedure report has been included in a sealed envelope for you to review at your convenience later.  YOU SHOULD EXPECT: Some feelings of bloating in the abdomen. Passage of more gas than usual.  Walking can help get rid of the air that was put into your GI tract during the procedure and reduce the bloating. If you had a lower endoscopy (such as a colonoscopy or flexible sigmoidoscopy) you may notice spotting of blood in your stool or on the toilet paper. If you underwent a bowel prep for your procedure, you may not have a normal bowel movement for a few days.  Please Note:  You might notice some irritation and congestion in your nose or some drainage.  This is from the oxygen used during your procedure.  There is no need for concern and it should clear up in a day or so.  SYMPTOMS TO REPORT IMMEDIATELY:   Following lower endoscopy (colonoscopy or flexible sigmoidoscopy):  Excessive amounts of blood in the stool  Significant tenderness or worsening of abdominal pains  Swelling of the abdomen that is new, acute  Fever of 100F or higher  For urgent or emergent issues, a gastroenterologist can be reached at any hour by calling [phone].   DIET:  We do recommend a small meal at first, but then you may proceed to your regular diet.  Drink plenty of fluids but you should avoid alcoholic beverages for 24 hours.  ACTIVITY:  You should plan to take it easy for the rest of today and you should NOT DRIVE or use heavy machinery until tomorrow (because of the sedation medicines used during the test).     FOLLOW UP: Our staff will call the number listed on your records the next business day following your procedure to check on you and address any questions or concerns that you may have regarding the information given to you following your procedure. If we do not reach you, we will leave a message.  However, if you are feeling well and you are not experiencing any problems, there is no need to return our call.  We will assume that you have returned to your regular daily activities without incident.  If any biopsies were taken you will be contacted by phone or by letter within the next 1-3 weeks.  Please call us at [phone] if you have not heard about the biopsies in 3 weeks.    SIGNATURES/CONFIDENTIALITY: You and/or your care partner have signed paperwork which will be entered into your electronic medical record.  These signatures attest to the fact that that the information above on your After Visit Summary has been reviewed and is understood.  Full responsibility of the confidentiality of this discharge information lies with you and/or your care-partner. 

## 2018-09-29 NOTE — Op Note (Signed)
[place] Patient Name: Charles Santos Procedure Date: 09/29/2018 1:34 PM MRN: [id number] Endoscopist: [name] , MD Age: 47 Referring MD:  Date of Birth: 02-Feb-1971 Gender: Male Account #: [id number] Procedure:                Flexible Sigmoidoscopy Indications:              High risk colon cancer surveillance: Personal                            history of colon cancer [name]: Genetically                            proven [name]; s/p EGD [facility] 12/2016 Dr. Stephanie Acre                            for EMR of dudoenal adenoma (2.5cm, no HGD); s/p                            colon cancer at age 69 and then alsoadenocarcinoma                            the transverse colon 2017, stage II (T3 N0), status                            post subtotal colectomy 09/17/2016; Flexible                            sigmoidoscopy 05/2017 [name]; normal IC anastomosis                            at 20cm; repeat at 1 year EGD 05/2017: minor                            gastritis, [facility] EMR duodenal site looked normal;                            repeat at 2 years Medicines:                Monitored Anesthesia Care Procedure:                Pre-Anesthesia Assessment:                           - Prior to the procedure, a History and Physical                            was performed, and patient medications and                            allergies were reviewed. The patient's tolerance of                            previous anesthesia was also reviewed. The risks  and benefits of the procedure and the sedation                            options and risks were discussed with the patient.                            All questions were answered, and informed consent                            was obtained. Prior Anticoagulants: The patient has                            taken no previous anticoagulant or antiplatelet                            agents. ASA Grade Assessment:  II - A patient with                            mild systemic disease. After reviewing the risks                            and benefits, the patient was deemed in                            satisfactory condition to undergo the procedure.                           After obtaining informed consent, the scope was                            passed under direct vision. The Model CF-HQ190L                            [id number]) scope was introduced through the anus                            and advanced to the the ileo-sigmoid anastomosis.                            The flexible sigmoidoscopy was accomplished without                            difficulty. The patient tolerated the procedure                            well. The flexible sigmoidoscopy was accomplished                            without difficulty. The patient tolerated the                            procedure well. Scope In: 1:41:40 PM Scope Out: 1:45:17 PM Total Procedure Duration: 0 hours 3 minutes 37 seconds  Findings:  The entire examined colon appeared normal to the                            ileocolonic anastomosis (20cm from the anus). Complications:            No immediate complications. Estimated blood loss:                            None. Estimated Blood Loss:     Estimated blood loss: none. Impression:               - The entire examined colon (to the ileocolonic                            anastomosis about 20cm from the anus) was normal.                           - No polyps or cancers Recommendation:           - Patient has a contact number available for                            emergencies. The signs and symptoms of potential                            delayed complications were discussed with the                            patient. Return to normal activities tomorrow.                            Written discharge instructions were provided to the                            patient.                            - Resume regular diet.                           - Recall sigmoidoscopy in 1 year for Lynch                            Syndrome. Will try to time this with your next                            scheduled EGD. [name], MD 09/29/2018 1:52:30 PM This report has been signed electronically.

## 2018-09-29 NOTE — Telephone Encounter (Signed)
Called the patient per Dr. [name] request to remind him of his appointment for this afternoon. Left message, pt was unavailable. Sm

## 2018-09-29 NOTE — Progress Notes (Signed)
Patient had brown chunky stool after enema. Dr. [name] notified and ordered a second enema. This was given with yellow liquid and small sediment results.

## 2018-10-02 ENCOUNTER — Telehealth: Payer: Self-pay | Admitting: *Deleted

## 2018-10-02 NOTE — Telephone Encounter (Signed)
  Follow up Call-  Call back number 09/29/2018 09/07/2016 08/04/2016  Post procedure Call Back phone  # [phone] cell [phone]  Permission to leave phone message Yes Yes Yes  Some recent data might be hidden     Patient questions:  Do you have a fever, pain , or abdominal swelling? No. Pain Score  0 *  Have you tolerated food without any problems? Yes.    Have you been able to return to your normal activities? Yes.    Do you have any questions about your discharge instructions: Diet   No. Medications  No. Follow up visit  No.  Do you have questions or concerns about your Care? Yes.   Patient was just wondering if its normal to have Mucous like stools ? I told him I would refer this question to Dr. [name]. Actions: * If pain score is 4 or above: No action needed, pain <4.

## 2018-10-02 NOTE — Telephone Encounter (Signed)
He has had a subtotal colectomy and so unusual, mucousy stools it is not uncommon.

## 2018-10-11 ENCOUNTER — Ambulatory Visit (INDEPENDENT_AMBULATORY_CARE_PROVIDER_SITE_OTHER): Payer: Managed Care, Other (non HMO) | Admitting: Family Medicine

## 2018-10-11 ENCOUNTER — Encounter: Payer: Self-pay | Admitting: Family Medicine

## 2018-10-11 VITALS — BP 110/67 | HR 67 | Temp 97.6°F | Ht 70.0 in | Wt 192.0 lb

## 2018-10-11 DIAGNOSIS — M5441 Lumbago with sciatica, right side: Secondary | ICD-10-CM | POA: Diagnosis not present

## 2018-10-11 MED ORDER — METHYLPREDNISOLONE ACETATE 80 MG/ML IJ SUSP
80.0000 mg | Freq: Once | INTRAMUSCULAR | Status: AC
  Administered 2018-10-11: 80 mg via INTRAMUSCULAR

## 2018-10-11 MED ORDER — NAPROXEN 500 MG PO TABS: 500.0000 mg | ORAL_TABLET | Freq: Two times a day (BID) | ORAL | 1 refills | Status: DC

## 2018-10-11 NOTE — Progress Notes (Signed)
Subjective:    Patient ID: Charles Santos, male    DOB: [birth year], 47 y.o.   MRN: [id number]  Chief Complaint:  Bilateral leg pain, lower back pain (x 3 months, right leg seems to be worse)   HPI: Charles Santos is a 47 y.o. male presenting on 10/11/2018 for Bilateral leg pain, lower back pain (x 3 months, right leg seems to be worse)  Pt presents today with complaints of lower back pain with radiation down right leg. Denies injury. States this started over 3 months ago and has been waxing and waning. States the pain is dull at times and then sharp and shooting at times. Pt states the pain is exacerbated by bending and walking. States he has tried Aleve with some relief of symptoms. He denies loss of bowel or bladder function. Denies saddle anesthesia. States he does get tingling in his right leg at times. Denies weakness, fever, fatigue, or changes in weight.   Relevant past medical, surgical, family, and social history reviewed and updated as indicated.  Allergies and medications reviewed and updated.   Past Medical History:  Diagnosis Date  . Anxiety   . Cancer of transverse colon ([place]) 09/17/2016   surgery done  . Colon cancer [facility]) 1998   surgery and chemo  . Complication of anesthesia    not fully under for polyp removal at [place] 2017  . Depression   . Emphysema of lung ([place])   . Family hx of colon cancer   . GERD (gastroesophageal reflux disease)    none recent  . Headache   . Hyperlipidemia   . Vitamin D deficiency     Past Surgical History:  Procedure Laterality Date  . APPENDECTOMY    . colon resectomy  1998   2 degree herida  . COLONOSCOPY    . ESOPHAGOGASTRODUODENOSCOPY (EGD) WITH PROPOFOL N/A 05/19/2017   Procedure: ESOPHAGOGASTRODUODENOSCOPY (EGD) WITH PROPOFOL;  Surgeon: [name], MD;  [facility];  Service: Endoscopy;  Laterality: N/A;  . FLEXIBLE SIGMOIDOSCOPY N/A 05/19/2017   Procedure: FLEXIBLE SIGMOIDOSCOPY;  Surgeon: [name], MD;  [facility];  Service: Endoscopy;  Laterality: N/A;  . INGUINAL HERNIA REPAIR    . LAPAROSCOPIC SUBTOTAL COLECTOMY N/A 09/17/2016   Procedure: DIAGNOSTIC LAPAROSCOPY EXPLORATORY LAPAROTOMY SUBTOTAL COLECTOMY PROCTOSCOPY;  Surgeon: [name], MD;  Location: [facility];  Service: General;  Laterality: N/A;  . polyp removal at [place]  2017  . PROCTOSCOPY  09/17/2016   Procedure: PROCTOSCOPY;  Surgeon: [name], MD;  Location: [facility];  Service: General;;  . UPPER GI ENDOSCOPY      Social History   Socioeconomic History  . Marital status: Legally Separated    Spouse name: Not on file  . Number of children: Not on file  . Years of education: Not on file  . Highest education level: Not on file  Occupational History  . Not on file  Social Needs  . Financial resource strain: Not on file  . Food insecurity:    Worry: Not on file    Inability: Not on file  . Transportation needs:    Medical: Not on file    Non-medical: Not on file  Tobacco Use  . Smoking status: Current Every Day Smoker    Packs/day: 1.25    Years: 23.00    Pack years: 28.75    Types: Cigarettes  . Smokeless tobacco: Never Used  Substance and Sexual Activity  . Alcohol use:  Yes    Alcohol/week: 68.0 standard drinks    Types: 12 Cans of beer, 56 Standard drinks or equivalent per week    Comment: occ  . Drug use: No  . Sexual activity: Not on file  Lifestyle  . Physical activity:    Days per week: Not on file    Minutes per session: Not on file  . Stress: Not on file  Relationships  . Social connections:    Talks on phone: Not on file    Gets together: Not on file    Attends religious service: Not on file    Active member of club or organization: Not on file    Attends meetings of clubs or organizations: Not on file    Relationship status: Not on file  . Intimate partner violence:    Fear of current or ex partner: Not on file    Emotionally abused: Not on file     Physically abused: Not on file    Forced sexual activity: Not on file  Other Topics Concern  . Not on file  Social History Narrative  . Not on file    Outpatient Encounter Medications as of 10/11/2018  Medication Sig  . naproxen sodium (ALEVE) 220 MG tablet Take 220 mg by mouth daily as needed.  Marland Kitchen QUEtiapine (SEROQUEL) 100 MG tablet Take 1 tablet (100 mg total) by mouth at bedtime.  . naproxen (NAPROSYN) 500 MG tablet Take 1 tablet (500 mg total) by mouth 2 (two) times daily with a meal.  . [DISCONTINUED] Acetaminophen (TYLENOL PO) Take 2 tablets by mouth every 8 (eight) hours as needed (pain).   Facility-Administered Encounter Medications as of 10/11/2018  Medication  . 0.9 %  sodium chloride infusion  . methylPREDNISolone acetate (DEPO-MEDROL) injection 80 mg    Allergies  Allergen Reactions  . Adhesive [Tape] Rash    Prefers paper tape    Review of Systems  Constitutional: Negative for activity change, appetite change, chills, fatigue, fever and unexpected weight change.  Respiratory: Negative for cough and shortness of breath.   Cardiovascular: Negative for chest pain, palpitations and leg swelling.  Gastrointestinal: Negative for constipation, diarrhea, nausea and vomiting.  Genitourinary: Negative for difficulty urinating.  Musculoskeletal: Positive for arthralgias, back pain (low right ) and myalgias (right leg). Negative for gait problem, joint swelling, neck pain and neck stiffness.  Skin: Negative for rash and wound.  Neurological: Negative for dizziness, tremors, weakness and numbness.  Psychiatric/Behavioral: Negative for confusion.  All other systems reviewed and are negative.       Objective:    BP 110/67   Pulse 67   Temp 97.6 F (36.4 C) (Oral)   Ht _0  (1.778 m)   Wt 192 lb (87.1 kg)   BMI 27.55 kg/m    Wt Readings from Last 3 Encounters:  10/11/18 192 lb (87.1 kg)  09/29/18 196 lb (88.9 kg)  08/11/18 196 lb (88.9 kg)    Physical Exam    Constitutional: He is oriented to person, place, and time. He appears well-developed and well-nourished. He is cooperative. No distress (mild).  HENT:  Head: Normocephalic and atraumatic.  Neck: Trachea normal, normal range of motion, full passive range of motion without pain and phonation normal. Neck supple.  Cardiovascular: Normal rate, regular rhythm, normal heart sounds and intact distal pulses. Exam reveals no gallop and no friction rub.  No murmur heard. Pulmonary/Chest: Effort normal and breath sounds normal. No respiratory distress.  Abdominal: Soft. Bowel sounds are  normal. There is no tenderness.  Musculoskeletal:       Lumbar back: He exhibits decreased range of motion, tenderness and pain. He exhibits no bony tenderness, no swelling, no edema, no deformity, no laceration, no spasm and normal pulse.       Back:  Positive bilateral straight leg raise test. Lower back pain with FROM flexion, extension, and bilateral rotation.   Neurological: He is alert and oriented to person, place, and time. He displays normal reflexes. No sensory deficit. He exhibits normal muscle tone. Coordination normal.  Skin: Skin is warm and dry. Capillary refill takes less than 2 seconds.  Psychiatric: He has a normal mood and affect. His behavior is normal. Judgment and thought content normal.  Nursing note and vitals reviewed.   Results for orders placed or performed in visit on 05/25/18  CMP14+EGFR  Result Value Ref Range   Glucose 75 65 - 99 mg/dL   BUN 13 6 - 24 mg/dL   Creatinine, Ser 1.02 0.76 - 1.27 mg/dL   GFR calc non Af Amer 88 >59 mL/min/1.73   GFR calc Af Amer 102 >59 mL/min/1.73   BUN/Creatinine Ratio 13 9 - 20   Sodium 143 134 - 144 mmol/L   Potassium 4.8 3.5 - 5.2 mmol/L   Chloride 105 96 - 106 mmol/L   CO2 23 20 - 29 mmol/L   Calcium 9.6 8.7 - 10.2 mg/dL   Total Protein 6.8 6.0 - 8.5 g/dL   Albumin 4.4 3.5 - 5.5 g/dL   Globulin, Total 2.4 1.5 - 4.5 g/dL   Albumin/Globulin  Ratio 1.8 1.2 - 2.2   Bilirubin Total 0.5 0.0 - 1.2 mg/dL   Alkaline Phosphatase 81 39 - 117 IU/L   AST 26 0 - 40 IU/L   ALT 33 0 - 44 IU/L  Lipase  Result Value Ref Range   Lipase 89 (H) 13 - 78 U/L       Pertinent labs & imaging results that were available during my care of the patient were reviewed by me and considered in my medical decision making.  Assessment & Plan:  Charles Santos was seen today for bilateral leg pain, lower back pain.  Diagnoses and all orders for this visit:  Acute right-sided low back pain with right-sided sciatica Back exercises, Lidoderm patch or Biofreeze, moist heat, medications as prescribed.  -     naproxen (NAPROSYN) 500 MG tablet; Take 1 tablet (500 mg total) by mouth 2 (two) times daily with a meal. -     methylPREDNISolone acetate (DEPO-MEDROL) injection 80 mg    Continue all other maintenance medications.  Follow up plan: Return in about 6 weeks (around 11/22/2018), or if symptoms worsen or fail to improve.  Educational handout given for back exercises, sciatica, low back pain  The above assessment and management plan was discussed with the patient. The patient verbalized understanding of and has agreed to the management plan. Patient is aware to call the clinic if symptoms persist or worsen. Patient is aware when to return to the clinic for a follow-up visit. Patient educated on when it is appropriate to go to the emergency department.   Monia Pouch, FNP-C [facility] [phone]

## 2018-10-11 NOTE — Patient Instructions (Signed)
Back Exercises If you have pain in your back, do these exercises 2-3 times each day or as told by your doctor. When the pain goes away, do the exercises once each day, but repeat the steps more times for each exercise (do more repetitions). If you do not have pain in your back, do these exercises once each day or as told by your doctor. Exercises Single Knee to Chest  Do these steps 3-5 times in a row for each leg: 1. Lie on your back on a firm bed or the floor with your legs stretched out. 2. Bring one knee to your chest. 3. Hold your knee to your chest by grabbing your knee or thigh. 4. Pull on your knee until you feel a gentle stretch in your lower back. 5. Keep doing the stretch for 10-30 seconds. 6. Slowly let go of your leg and straighten it.  Pelvic Tilt  Do these steps 5-10 times in a row: 1. Lie on your back on a firm bed or the floor with your legs stretched out. 2. Bend your knees so they point up to the ceiling. Your feet should be flat on the floor. 3. Tighten your lower belly (abdomen) muscles to press your lower back against the floor. This will make your tailbone point up to the ceiling instead of pointing down to your feet or the floor. 4. Stay in this position for 5-10 seconds while you gently tighten your muscles and breathe evenly.  Cat-Cow  Do these steps until your lower back bends more easily: 1. Get on your hands and knees on a firm surface. Keep your hands under your shoulders, and keep your knees under your hips. You may put padding under your knees. 2. Let your head hang down, and make your tailbone point down to the floor so your lower back is round like the back of a cat. 3. Stay in this position for 5 seconds. 4. Slowly lift your head and make your tailbone point up to the ceiling so your back hangs low (sags) like the back of a cow. 5. Stay in this position for 5 seconds.  Press-Ups  Do these steps 5-10 times in a row: 1. Lie on your belly (face-down)  on the floor. 2. Place your hands near your head, about shoulder-width apart. 3. While you keep your back relaxed and keep your hips on the floor, slowly straighten your arms to raise the top half of your body and lift your shoulders. Do not use your back muscles. To make yourself more comfortable, you may change where you place your hands. 4. Stay in this position for 5 seconds. 5. Slowly return to lying flat on the floor.  Bridges  Do these steps 10 times in a row: 1. Lie on your back on a firm surface. 2. Bend your knees so they point up to the ceiling. Your feet should be flat on the floor. 3. Tighten your butt muscles and lift your butt off of the floor until your waist is almost as high as your knees. If you do not feel the muscles working in your butt and the back of your thighs, slide your feet 1-2 inches farther away from your butt. 4. Stay in this position for 3-5 seconds. 5. Slowly lower your butt to the floor, and let your butt muscles relax.  If this exercise is too easy, try doing it with your arms crossed over your chest. Belly Crunches  Do these steps 5-10 times in   a row: 1. Lie on your back on a firm bed or the floor with your legs stretched out. 2. Bend your knees so they point up to the ceiling. Your feet should be flat on the floor. 3. Cross your arms over your chest. 4. Tip your chin a little bit toward your chest but do not bend your neck. 5. Tighten your belly muscles and slowly raise your chest just enough to lift your shoulder blades a tiny bit off of the floor. 6. Slowly lower your chest and your head to the floor.  Back Lifts Do these steps 5-10 times in a row: 1. Lie on your belly (face-down) with your arms at your sides, and rest your forehead on the floor. 2. Tighten the muscles in your legs and your butt. 3. Slowly lift your chest off of the floor while you keep your hips on the floor. Keep the back of your head in line with the curve in your back. Look at  the floor while you do this. 4. Stay in this position for 3-5 seconds. 5. Slowly lower your chest and your face to the floor.  Contact a doctor if:  Your back pain gets a lot worse when you do an exercise.  Your back pain does not lessen 2 hours after you exercise. If you have any of these problems, stop doing the exercises. Do not do them again unless your doctor says it is okay. Get help right away if:  You have sudden, very bad back pain. If this happens, stop doing the exercises. Do not do them again unless your doctor says it is okay. This information is not intended to replace advice given to you by your health care provider. Make sure you discuss any questions you have with your health care provider. Document Released: 01/01/2011 Document Revised: 05/06/2016 Document Reviewed: 01/23/2015 Elsevier Interactive Patient Education  2018 [place]. Sciatica Sciatica is pain, numbness, weakness, or tingling along your sciatic nerve. The sciatic nerve starts in the lower back and goes down the back of each leg. Sciatica happens when this nerve is pinched or has pressure put on it. Sciatica usually goes away on its own or with treatment. Sometimes, sciatica may keep coming back (recur). Follow these instructions at home: Medicines  Take over-the-counter and prescription medicines only as told by your doctor.  Do not drive or use heavy machinery while taking prescription pain medicine. Managing pain  If directed, put ice on the affected area. ? Put ice in a plastic bag. ? Place a towel between your skin and the bag. ? Leave the ice on for 20 minutes, 2-3 times a day.  After icing, apply heat to the affected area before you exercise or as often as told by your doctor. Use the heat source that your doctor tells you to use, such as a moist heat pack or a heating pad. ? Place a towel between your skin and the heat source. ? Leave the heat on for 20-30 minutes. ? Remove the heat if your  skin turns bright red. This is especially important if you are unable to feel pain, heat, or cold. You may have a greater risk of getting burned. Activity  Return to your normal activities as told by your doctor. Ask your doctor what activities are safe for you. ? Avoid activities that make your sciatica worse.  Take short rests during the day. Rest in a lying or standing position. This is usually better than sitting to rest. ?  When you rest for a long time, do some physical activity or stretching between periods of rest. ? Avoid sitting for a long time without moving. Get up and move around at least one time each hour.  Exercise and stretch regularly, as told by your doctor.  Do not lift anything that is heavier than 10 lb (4.5 kg) while you have symptoms of sciatica. ? Avoid lifting heavy things even when you do not have symptoms. ? Avoid lifting heavy things over and over.  When you lift objects, always lift in a way that is safe for your body. To do this, you should: ? Bend your knees. ? Keep the object close to your body. ? Avoid twisting. General instructions  Use good posture. ? Avoid leaning forward when you are sitting. ? Avoid hunching over when you are standing.  Stay at a healthy weight.  Wear comfortable shoes that support your feet. Avoid wearing high heels.  Avoid sleeping on a mattress that is too soft or too hard. You might have less pain if you sleep on a mattress that is firm enough to support your back.  Keep all follow-up visits as told by your doctor. This is important. Contact a doctor if:  You have pain that: ? Wakes you up when you are sleeping. ? Gets worse when you lie down. ? Is worse than the pain you have had in the past. ? Lasts longer than 4 weeks.  You lose weight for without trying. Get help right away if:  You cannot control when you pee (urinate) or poop (have a bowel movement).  You have weakness in any of these areas and it gets  worse. ? Lower back. ? Lower belly (pelvis). ? Butt (buttocks). ? Legs.  You have redness or swelling of your back.  You have a burning feeling when you pee. This information is not intended to replace advice given to you by your health care provider. Make sure you discuss any questions you have with your health care provider. Document Released: 09/07/2008 Document Revised: 05/06/2016 Document Reviewed: 08/08/2015 Elsevier Interactive Patient Education  2018 [place]. Back Pain, Adult Back pain is very common. The pain often gets better over time. The cause of back pain is usually not dangerous. Most people can learn to manage their back pain on their own. Follow these instructions at home: Watch your back pain for any changes. The following actions may help to lessen any pain you are feeling:  Stay active. Start with short walks on flat ground if you can. Try to walk farther each day.  Exercise regularly as told by your doctor. Exercise helps your back heal faster. It also helps avoid future injury by keeping your muscles strong and flexible.  Do not sit, drive, or stand in one place for more than 30 minutes.  Do not stay in bed. Resting more than 1-2 days can slow down your recovery.  Be careful when you bend or lift an object. Use good form when lifting: ? Bend at your knees. ? Keep the object close to your body. ? Do not twist.  Sleep on a firm mattress. Lie on your side, and bend your knees. If you lie on your back, put a pillow under your knees.  Take medicines only as told by your doctor.  Put ice on the injured area. ? Put ice in a plastic bag. ? Place a towel between your skin and the bag. ? Leave the ice on for 20  minutes, 2-3 times a day for the first 2-3 days. After that, you can switch between ice and heat packs.  Avoid feeling anxious or stressed. Find good ways to deal with stress, such as exercise.  Maintain a healthy weight. Extra weight puts stress on  your back.  Contact a doctor if:  You have pain that does not go away with rest or medicine.  You have worsening pain that goes down into your legs or buttocks.  You have pain that does not get better in one week.  You have pain at night.  You lose weight.  You have a fever or chills. Get help right away if:  You cannot control when you poop (bowel movement) or pee (urinate).  Your arms or legs feel weak.  Your arms or legs lose feeling (numbness).  You feel sick to your stomach (nauseous) or throw up (vomit).  You have belly (abdominal) pain.  You feel like you may pass out (faint). This information is not intended to replace advice given to you by your health care provider. Make sure you discuss any questions you have with your health care provider. Document Released: 05/17/2008 Document Revised: 05/06/2016 Document Reviewed: 04/02/2014 Elsevier Interactive Patient Education  [employer].

## 2018-11-23 ENCOUNTER — Encounter: Payer: Self-pay | Admitting: Family Medicine

## 2018-11-23 ENCOUNTER — Ambulatory Visit (INDEPENDENT_AMBULATORY_CARE_PROVIDER_SITE_OTHER): Payer: Managed Care, Other (non HMO) | Admitting: Family Medicine

## 2018-11-23 VITALS — BP 138/74 | HR 91 | Temp 98.7°F | Ht 70.0 in | Wt 200.0 lb

## 2018-11-23 DIAGNOSIS — Z0001 Encounter for general adult medical examination with abnormal findings: Secondary | ICD-10-CM | POA: Diagnosis not present

## 2018-11-23 DIAGNOSIS — Z23 Encounter for immunization: Secondary | ICD-10-CM

## 2018-11-23 DIAGNOSIS — H10022 Other mucopurulent conjunctivitis, left eye: Secondary | ICD-10-CM

## 2018-11-23 DIAGNOSIS — F419 Anxiety disorder, unspecified: Secondary | ICD-10-CM | POA: Diagnosis not present

## 2018-11-23 DIAGNOSIS — Z6828 Body mass index (BMI) 28.0-28.9, adult: Secondary | ICD-10-CM | POA: Diagnosis not present

## 2018-11-23 DIAGNOSIS — Z0000 Encounter for general adult medical examination without abnormal findings: Secondary | ICD-10-CM

## 2018-11-23 MED ORDER — QUETIAPINE FUMARATE 100 MG PO TABS: 100.0000 mg | ORAL_TABLET | Freq: Every day | ORAL | 2 refills | Status: DC

## 2018-11-23 MED ORDER — POLYMYXIN B-TRIMETHOPRIM 10000-0.1 UNIT/ML-% OP SOLN: 2.0000 [drp] | OPHTHALMIC | 0 refills | Status: AC

## 2018-11-23 NOTE — Patient Instructions (Signed)

## 2018-11-23 NOTE — Progress Notes (Signed)
Subjective:    Patient ID: Charles Santos, male    DOB: August 10, 1971, 47 y.o.   MRN: [id number]  Chief Complaint:  Annual Exam   HPI: Charles Santos is a 47 y.o. male presenting on 11/23/2018 for Annual Exam   1. Annual visit for general adult medical examination without abnormal findings  Doing well overall. Occasional alcohol. Current every day smoker, no desire to quit at this time. [name] - followed by GI - last colonoscopy in October 2019 - negative. Denies abdominal pian, back pian, rectal pain, blood in stool, fever, chills, weakness, or unexpected weight changes.    2. Anxiety  Well controlled with Seroquel. Denies panic attacks or sleep disturbances. No problems with concentration. States tolerating medications well without adverse side effects.    3. BMI 28.0-28.9,adult  Does not diet or exercise on a regular basis.    4. Pink eye disease, left  Reports left eye redness, drainage, and scratchiness for three days. States recently exposed to pink eye (daughter and granddaughter).     Relevant past medical, surgical, family, and social history reviewed and updated as indicated.  Allergies and medications reviewed and updated.   Past Medical History:  Diagnosis Date  . Anxiety   . Cancer of transverse colon ([place]) 09/17/2016   surgery done  . Colon cancer [facility]) 1998   surgery and chemo  . Complication of anesthesia    not fully under for polyp removal at [place] 2017  . Depression   . Emphysema of lung ([place])   . Family hx of colon cancer   . GERD (gastroesophageal reflux disease)    none recent  . Headache   . Hyperlipidemia   . Vitamin D deficiency     Past Surgical History:  Procedure Laterality Date  . APPENDECTOMY    . colon resectomy  1998   2 degree herida  . COLONOSCOPY    . ESOPHAGOGASTRODUODENOSCOPY (EGD) WITH PROPOFOL N/A 05/19/2017   Procedure: ESOPHAGOGASTRODUODENOSCOPY (EGD) WITH PROPOFOL;  Surgeon: [name], MD;  Location: [facility]  ENDOSCOPY;  Service: Endoscopy;  Laterality: N/A;  . FLEXIBLE SIGMOIDOSCOPY N/A 05/19/2017   Procedure: FLEXIBLE SIGMOIDOSCOPY;  Surgeon: [name], MD;  Location: [facility] ENDOSCOPY;  Service: Endoscopy;  Laterality: N/A;  . INGUINAL HERNIA REPAIR    . LAPAROSCOPIC SUBTOTAL COLECTOMY N/A 09/17/2016   Procedure: DIAGNOSTIC LAPAROSCOPY EXPLORATORY LAPAROTOMY SUBTOTAL COLECTOMY PROCTOSCOPY;  Surgeon: [name], MD;  Location: [facility] ORS;  Service: General;  Laterality: N/A;  . polyp removal at [place]  2017  . PROCTOSCOPY  09/17/2016   Procedure: PROCTOSCOPY;  Surgeon: [name], MD;  Location: [facility] ORS;  Service: General;;  . UPPER GI ENDOSCOPY      Social History   Socioeconomic History  . Marital status: Legally Separated    Spouse name: Not on file  . Number of children: Not on file  . Years of education: Not on file  . Highest education level: Not on file  Occupational History  . Not on file  Social Needs  . Financial resource strain: Not on file  . Food insecurity:    Worry: Not on file    Inability: Not on file  . Transportation needs:    Medical: Not on file    Non-medical: Not on file  Tobacco Use  . Smoking status: Current Every Day Smoker    Packs/day: 1.25    Years: 23.00    Pack years: 28.75    Types: Cigarettes  .  Smokeless tobacco: Never Used  Substance and Sexual Activity  . Alcohol use: Yes    Alcohol/week: 68.0 standard drinks    Types: 12 Cans of beer, 56 Standard drinks or equivalent per week    Comment: occ  . Drug use: No  . Sexual activity: Not on file  Lifestyle  . Physical activity:    Days per week: Not on file    Minutes per session: Not on file  . Stress: Not on file  Relationships  . Social connections:    Talks on phone: Not on file    Gets together: Not on file    Attends religious service: Not on file    Active member of club or organization: Not on file    Attends meetings of clubs or organizations: Not on file     Relationship status: Not on file  . Intimate partner violence:    Fear of current or ex partner: Not on file    Emotionally abused: Not on file    Physically abused: Not on file    Forced sexual activity: Not on file  Other Topics Concern  . Not on file  Social History Narrative  . Not on file    Outpatient Encounter Medications as of 11/23/2018  Medication Sig  . naproxen (NAPROSYN) 500 MG tablet Take 1 tablet (500 mg total) by mouth 2 (two) times daily with a meal.  . naproxen sodium (ALEVE) 220 MG tablet Take 220 mg by mouth daily as needed.  Marland Kitchen QUEtiapine (SEROQUEL) 100 MG tablet Take 1 tablet (100 mg total) by mouth at bedtime.  . [DISCONTINUED] QUEtiapine (SEROQUEL) 100 MG tablet Take 1 tablet (100 mg total) by mouth at bedtime.  Marland Kitchen trimethoprim-polymyxin b (POLYTRIM) ophthalmic solution Place 2 drops into both eyes every 4 (four) hours for 5 days.   Facility-Administered Encounter Medications as of 11/23/2018  Medication  . 0.9 %  sodium chloride infusion    Allergies  Allergen Reactions  . Adhesive [Tape] Rash    Prefers paper tape    Review of Systems  Constitutional: Negative for activity change, appetite change, chills, fatigue, fever and unexpected weight change.  HENT: Negative for congestion, sore throat, trouble swallowing and voice change.   Eyes: Positive for pain, discharge, redness and itching.  Respiratory: Negative for cough, chest tightness and shortness of breath.   Cardiovascular: Negative for chest pain, palpitations and leg swelling.  Gastrointestinal: Negative for abdominal distention, abdominal pain, anal bleeding, blood in stool, diarrhea, nausea, rectal pain and vomiting.  Genitourinary: Negative for decreased urine volume, difficulty urinating, discharge, dysuria, enuresis, flank pain, frequency, penile pain, penile swelling, scrotal swelling, testicular pain and urgency.  Musculoskeletal: Negative for back pain and myalgias.  Neurological:  Negative for dizziness, syncope, weakness and headaches.  Psychiatric/Behavioral: Negative for agitation, behavioral problems, confusion, decreased concentration, dysphoric mood, hallucinations, self-injury, sleep disturbance and suicidal ideas. The patient is nervous/anxious. The patient is not hyperactive.   All other systems reviewed and are negative.       Objective:    BP 138/74   Pulse 91   Temp 98.7 F (37.1 C) (Oral)   Ht 5' 10" (1.778 m)   Wt 200 lb (90.7 kg)   BMI 28.70 kg/m    Wt Readings from Last 3 Encounters:  11/23/18 200 lb (90.7 kg)  10/11/18 192 lb (87.1 kg)  09/29/18 196 lb (88.9 kg)    Physical Exam Vitals signs and nursing note reviewed.  Constitutional:  General: He is not in acute distress.    Appearance: He is well-developed and overweight. He is not ill-appearing.  HENT:     Head: Normocephalic and atraumatic.     Jaw: There is normal jaw occlusion.     Right Ear: Hearing, tympanic membrane, ear canal and external ear normal.     Left Ear: Hearing, tympanic membrane, ear canal and external ear normal.     Nose: Nose normal.     Mouth/Throat:     Lips: Pink.     Mouth: Mucous membranes are moist.     Pharynx: Oropharynx is clear. Uvula midline.  Eyes:     General: No allergic shiner, visual field deficit or scleral icterus.       Right eye: No foreign body, discharge or hordeolum.        Left eye: Discharge present.No foreign body or hordeolum.     Extraocular Movements:     Right eye: Normal extraocular motion and no nystagmus.     Left eye: Normal extraocular motion and no nystagmus.     Conjunctiva/sclera:     Right eye: Right conjunctiva is not injected. No chemosis, exudate or hemorrhage.    Left eye: Left conjunctiva is injected. Exudate present. No chemosis or hemorrhage. Neck:     Musculoskeletal: Normal range of motion and neck supple.     Thyroid: No thyroid mass, thyromegaly or thyroid tenderness.     Vascular: Normal carotid  pulses. No carotid bruit, hepatojugular reflux or JVD.     Trachea: Trachea and phonation normal.  Cardiovascular:     Rate and Rhythm: Normal rate and regular rhythm.     Chest Wall: PMI is not displaced.     Pulses: Normal pulses.     Heart sounds: Normal heart sounds. No murmur. No friction rub. No gallop.   Pulmonary:     Effort: Pulmonary effort is normal.     Breath sounds: Normal breath sounds.  Abdominal:     General: Abdomen is protuberant. Bowel sounds are normal. There is no distension or abdominal bruit.     Palpations: Abdomen is soft. There is no shifting dullness, fluid wave, hepatomegaly, splenomegaly, mass or pulsatile mass.     Tenderness: There is no abdominal tenderness. There is no right CVA tenderness or left CVA tenderness.     Hernia: No hernia is present.  Musculoskeletal: Normal range of motion.     Right lower leg: No edema.     Left lower leg: No edema.  Lymphadenopathy:     Cervical: No cervical adenopathy.  Skin:    General: Skin is warm and dry.     Capillary Refill: Capillary refill takes less than 2 seconds.  Neurological:     Mental Status: He is alert and oriented to person, place, and time.     Cranial Nerves: Cranial nerves are intact.     Sensory: Sensation is intact.     Motor: Motor function is intact.     Coordination: Coordination is intact.     Deep Tendon Reflexes: Reflexes are normal and symmetric.  Psychiatric:        Attention and Perception: Attention and perception normal.        Mood and Affect: Mood normal.        Speech: Speech normal.        Behavior: Behavior normal. Behavior is cooperative.        Thought Content: Thought content normal. Thought content is not paranoid or delusional.   Thought content does not include homicidal or suicidal ideation. Thought content does not include homicidal or suicidal plan.        Cognition and Memory: Cognition normal.        Judgment: Judgment normal.     Results for orders placed or  performed in visit on 05/25/18  CMP14+EGFR  Result Value Ref Range   Glucose 75 65 - 99 mg/dL   BUN 13 6 - 24 mg/dL   Creatinine, Ser 1.02 0.76 - 1.27 mg/dL   GFR calc non Af Amer 88 >59 mL/min/1.73   GFR calc Af Amer 102 >59 mL/min/1.73   BUN/Creatinine Ratio 13 9 - 20   Sodium 143 134 - 144 mmol/L   Potassium 4.8 3.5 - 5.2 mmol/L   Chloride 105 96 - 106 mmol/L   CO2 23 20 - 29 mmol/L   Calcium 9.6 8.7 - 10.2 mg/dL   Total Protein 6.8 6.0 - 8.5 g/dL   Albumin 4.4 3.5 - 5.5 g/dL   Globulin, Total 2.4 1.5 - 4.5 g/dL   Albumin/Globulin Ratio 1.8 1.2 - 2.2   Bilirubin Total 0.5 0.0 - 1.2 mg/dL   Alkaline Phosphatase 81 39 - 117 IU/L   AST 26 0 - 40 IU/L   ALT 33 0 - 44 IU/L  Lipase  Result Value Ref Range   Lipase 89 (H) 13 - 78 U/L       Pertinent labs & imaging results that were available during my care of the patient were reviewed by me and considered in my medical decision making.  Assessment & Plan:  Lynford was seen today for annual exam.  Diagnoses and all orders for this visit:  Annual visit for general adult medical examination without abnormal findings -     CBC with Differential -     Comprehensive metabolic panel -     Lipid panel -     TSH -     HIV antibody  Anxiety Stress management. Medications as prescribed.  -     QUEtiapine (SEROQUEL) 100 MG tablet; Take 1 tablet (100 mg total) by mouth at bedtime.  BMI 28.0-28.9,adult Diet and exercise encouraged.   Pink eye disease, left Medications as prescribed. Good hand hygiene. Infection control discussed.    Health maintenance discussed.  Labs pending. Diet and exercise encouraged.   Continue all other maintenance medications.  Follow up plan: Return in 3 months (on 02/22/2019), or if symptoms worsen or fail to improve.  Educational handout given for health maintenance   The above assessment and management plan was discussed with the patient. The patient verbalized understanding of and has agreed to  the management plan. Patient is aware to call the clinic if symptoms persist or worsen. Patient is aware when to return to the clinic for a follow-up visit. Patient educated on when it is appropriate to go to the emergency department.   Monia Pouch, FNP-C [facility] [phone]

## 2018-11-24 ENCOUNTER — Other Ambulatory Visit: Payer: Self-pay | Admitting: Family Medicine

## 2018-11-24 ENCOUNTER — Encounter: Payer: Self-pay | Admitting: Family Medicine

## 2018-11-24 DIAGNOSIS — E781 Pure hyperglyceridemia: Secondary | ICD-10-CM

## 2018-11-24 LAB — CBC WITH DIFFERENTIAL/PLATELET
BASOS: 1 %
Basophils Absolute: 0.1 10*3/uL (ref 0.0–0.2)
EOS (ABSOLUTE): 0.2 10*3/uL (ref 0.0–0.4)
EOS: 2 %
Hematocrit: 47.1 % (ref 37.5–51.0)
Hemoglobin: 16.6 g/dL (ref 13.0–17.7)
IMMATURE GRANS (ABS): 0.1 10*3/uL (ref 0.0–0.1)
IMMATURE GRANULOCYTES: 1 %
LYMPHS: 18 %
Lymphocytes Absolute: 1.6 10*3/uL (ref 0.7–3.1)
MCH: 34.9 pg — ABNORMAL HIGH (ref 26.6–33.0)
MCHC: 35.2 g/dL (ref 31.5–35.7)
MCV: 99 fL — ABNORMAL HIGH (ref 79–97)
MONOCYTES: 8 %
Monocytes Absolute: 0.7 10*3/uL (ref 0.1–0.9)
NEUTROS PCT: 70 %
Neutrophils Absolute: 6.2 10*3/uL (ref 1.4–7.0)
PLATELETS: 195 10*3/uL (ref 150–450)
RBC: 4.76 x10E6/uL (ref 4.14–5.80)
RDW: 13.9 % (ref 12.3–15.4)
WBC: 8.8 10*3/uL (ref 3.4–10.8)

## 2018-11-24 LAB — COMPREHENSIVE METABOLIC PANEL
A/G RATIO: 1.8 (ref 1.2–2.2)
ALT: 27 IU/L (ref 0–44)
AST: 23 IU/L (ref 0–40)
Albumin: 3.9 g/dL (ref 3.5–5.5)
Alkaline Phosphatase: 68 IU/L (ref 39–117)
BUN/Creatinine Ratio: 26 — ABNORMAL HIGH (ref 9–20)
BUN: 22 mg/dL (ref 6–24)
Bilirubin Total: 0.3 mg/dL (ref 0.0–1.2)
CALCIUM: 8.9 mg/dL (ref 8.7–10.2)
CO2: 23 mmol/L (ref 20–29)
Chloride: 103 mmol/L (ref 96–106)
Creatinine, Ser: 0.85 mg/dL (ref 0.76–1.27)
GFR, EST AFRICAN AMERICAN: 121 mL/min/{1.73_m2} (ref >59)
GFR, EST NON AFRICAN AMERICAN: 104 mL/min/{1.73_m2} (ref >59)
GLUCOSE: 88 mg/dL (ref 65–99)
Globulin, Total: 2.2 g/dL (ref 1.5–4.5)
Potassium: 4 mmol/L (ref 3.5–5.2)
Sodium: 142 mmol/L (ref 134–144)
TOTAL PROTEIN: 6.1 g/dL (ref 6.0–8.5)

## 2018-11-24 LAB — HIV ANTIBODY (ROUTINE TESTING W REFLEX): HIV Screen 4th Generation wRfx: NONREACTIVE

## 2018-11-24 LAB — LIPID PANEL
CHOL/HDL RATIO: 3.7 ratio (ref 0.0–5.0)
Cholesterol, Total: 179 mg/dL (ref 100–199)
HDL: 49 mg/dL (ref >39)
Triglycerides: 500 mg/dL — ABNORMAL HIGH (ref 0–149)

## 2018-11-24 LAB — TSH: TSH: 0.665 u[IU]/mL (ref 0.450–4.500)

## 2018-11-24 MED ORDER — ICOSAPENT ETHYL 1 G PO CAPS: 2.0000 g | ORAL_CAPSULE | Freq: Two times a day (BID) | ORAL | 3 refills | Status: DC

## 2018-12-14 ENCOUNTER — Telehealth: Payer: Self-pay | Admitting: Family Medicine

## 2018-12-14 NOTE — Telephone Encounter (Signed)
He can try mucinex cough and congestion, he canuse max strength

## 2018-12-15 ENCOUNTER — Telehealth: Payer: Self-pay | Admitting: Family Medicine

## 2018-12-15 NOTE — Telephone Encounter (Signed)
He can buy this over the counter.

## 2018-12-15 NOTE — Telephone Encounter (Signed)
Pt aware and will get otc fish oil.

## 2018-12-15 NOTE — Telephone Encounter (Signed)
Pt aware and has appt scheduled for Saturday clinic.

## 2018-12-16 ENCOUNTER — Ambulatory Visit: Payer: Managed Care, Other (non HMO) | Admitting: Family Medicine

## 2018-12-16 ENCOUNTER — Encounter: Payer: Self-pay | Admitting: Family Medicine

## 2018-12-16 VITALS — BP 129/65 | HR 99 | Temp 100.1°F | Ht 70.0 in | Wt 194.0 lb

## 2018-12-16 DIAGNOSIS — R6889 Other general symptoms and signs: Secondary | ICD-10-CM

## 2018-12-16 DIAGNOSIS — J0100 Acute maxillary sinusitis, unspecified: Secondary | ICD-10-CM | POA: Diagnosis not present

## 2018-12-16 MED ORDER — AZITHROMYCIN 250 MG PO TABS: ORAL_TABLET | ORAL | 0 refills | Status: DC

## 2018-12-16 NOTE — Progress Notes (Signed)
BP 129/65 (BP Location: Left Arm)   Pulse 99   Temp 100.1 F (37.8 C) (Oral)   Ht 5\' 10"  (1.778 m)   Wt 194 lb (88 kg)   BMI 27.84 kg/m    Subjective:    Patient ID: Charles Santos, male    DOB: [birth year], 48 y.o.   MRN: [id number]  HPI: Charles Santos is a 47 y.o. male presenting on 12/16/2018 for Cough (x 6 days ); Generalized Body Aches; Fever; and Sore Throat   HPI Patient comes in complaining of body aches and fever and sore throat and sinus congestion and pressure and postnasal drainage is been going on for the past 6 days.  He says he has felt feverish but has not taken his temperature and today here in the office he has a temperature of 100.1.  He has been coughing up thick yellow sputum and have a lot of sinus pressure and congestion.  He has been having sore throat as well going on with all of this.  Relevant past medical, surgical, family and social history reviewed and updated as indicated. Interim medical history since our last visit reviewed. Allergies and medications reviewed and updated.  Review of Systems  Constitutional: Negative for chills and fever.  HENT: Positive for congestion, postnasal drip, rhinorrhea, sinus pressure, sneezing and sore throat. Negative for ear discharge, ear pain and voice change.   Eyes: Negative for pain, discharge, redness and visual disturbance.  Respiratory: Positive for cough. Negative for shortness of breath and wheezing.   Cardiovascular: Negative for chest pain and leg swelling.  Musculoskeletal: Negative for gait problem.  Skin: Negative for rash.  All other systems reviewed and are negative.   Per HPI unless specifically indicated above   Allergies as of 12/16/2018      Reactions   Adhesive [tape] Rash   Prefers paper tape      Medication List       Accurate as of December 16, 2018  8:31 AM. Always use your most recent med list.        azithromycin 250 MG tablet Commonly known as:  ZITHROMAX Take 2 tablets today and  then 1 tablet day 2-5.   QUEtiapine 100 MG tablet Commonly known as:  SEROQUEL Take 1 tablet (100 mg total) by mouth at bedtime.          Objective:    BP 129/65 (BP Location: Left Arm)   Pulse 99   Temp 100.1 F (37.8 C) (Oral)   Ht 5\' 10"  (1.778 m)   Wt 194 lb (88 kg)   BMI 27.84 kg/m   Wt Readings from Last 3 Encounters:  12/16/18 194 lb (88 kg)  11/23/18 200 lb (90.7 kg)  10/11/18 192 lb (87.1 kg)    Physical Exam Vitals signs and nursing note reviewed.  Constitutional:      General: He is not in acute distress.    Appearance: He is well-developed. He is not diaphoretic.  HENT:     Right Ear: Tympanic membrane, ear canal and external ear normal.     Left Ear: Tympanic membrane, ear canal and external ear normal.     Nose: Mucosal edema and rhinorrhea present.     Right Sinus: Maxillary sinus tenderness present. No frontal sinus tenderness.     Left Sinus: Maxillary sinus tenderness present. No frontal sinus tenderness.     Mouth/Throat:     Pharynx: Uvula midline. Posterior oropharyngeal erythema present. No oropharyngeal exudate.  Tonsils: No tonsillar abscesses.  Eyes:     General: No scleral icterus.       Right eye: No discharge.     Conjunctiva/sclera: Conjunctivae normal.     Pupils: Pupils are equal, round, and reactive to light.  Neck:     Musculoskeletal: Neck supple.     Thyroid: No thyromegaly.  Cardiovascular:     Rate and Rhythm: Normal rate and regular rhythm.     Heart sounds: Normal heart sounds. No murmur.  Pulmonary:     Effort: Pulmonary effort is normal. No respiratory distress.     Breath sounds: Normal breath sounds. No wheezing or rales.  Musculoskeletal: Normal range of motion.  Lymphadenopathy:     Cervical: No cervical adenopathy.  Skin:    General: Skin is warm and dry.     Findings: No rash.  Neurological:     Mental Status: He is alert and oriented to person, place, and time.     Coordination: Coordination normal.    Psychiatric:        Behavior: Behavior normal.     Rapid flu and rapid strep both negative    Assessment & Plan:   Problem List Items Addressed This Visit    None    Visit Diagnoses    Flu-like symptoms    -  Primary   Relevant Orders   Veritor Flu A/B Waived   Acute non-recurrent maxillary sinusitis       Relevant Medications   azithromycin (ZITHROMAX) 250 MG tablet   Other Relevant Orders   Rapid Strep Screen ([facility] ONLY)       Follow up plan:  Return if symptoms worsen or fail to improve.  Counseling provided for all of the vaccine components Orders Placed This Encounter  Procedures  . Rapid Strep Screen ([facility] ONLY)  . Veritor Flu A/B Jackson, MD Ollie 12/16/2018, 8:31 AM

## 2018-12-18 LAB — VERITOR FLU A/B WAIVED
INFLUENZA A: NEGATIVE
INFLUENZA B: NEGATIVE

## 2018-12-18 LAB — CULTURE, GROUP A STREP

## 2018-12-18 LAB — RAPID STREP SCREEN (MED CTR MEBANE ONLY): Strep Gp A Ag, IA W/Reflex: NEGATIVE

## 2019-02-21 ENCOUNTER — Other Ambulatory Visit: Payer: Self-pay | Admitting: Family Medicine

## 2019-02-21 DIAGNOSIS — M5441 Lumbago with sciatica, right side: Secondary | ICD-10-CM

## 2019-02-22 ENCOUNTER — Ambulatory Visit: Payer: Managed Care, Other (non HMO) | Admitting: Family Medicine

## 2019-03-01 ENCOUNTER — Ambulatory Visit: Payer: Managed Care, Other (non HMO) | Admitting: Family Medicine

## 2019-04-17 ENCOUNTER — Other Ambulatory Visit: Payer: Self-pay | Admitting: Family Medicine

## 2019-04-17 DIAGNOSIS — M5441 Lumbago with sciatica, right side: Secondary | ICD-10-CM

## 2019-04-18 ENCOUNTER — Telehealth: Payer: Self-pay | Admitting: Family Medicine

## 2019-04-19 ENCOUNTER — Telehealth: Payer: Self-pay | Admitting: Family Medicine

## 2019-05-14 ENCOUNTER — Ambulatory Visit: Payer: Managed Care, Other (non HMO) | Admitting: Family Medicine

## 2019-05-28 ENCOUNTER — Ambulatory Visit: Payer: Managed Care, Other (non HMO) | Admitting: Family Medicine

## 2019-05-28 ENCOUNTER — Other Ambulatory Visit: Payer: Self-pay

## 2019-05-28 ENCOUNTER — Encounter: Payer: Self-pay | Admitting: Family Medicine

## 2019-05-28 ENCOUNTER — Ambulatory Visit (INDEPENDENT_AMBULATORY_CARE_PROVIDER_SITE_OTHER): Payer: Managed Care, Other (non HMO)

## 2019-05-28 VITALS — BP 145/82 | HR 71 | Temp 97.5°F | Ht 70.0 in | Wt 206.6 lb

## 2019-05-28 DIAGNOSIS — K4090 Unilateral inguinal hernia, without obstruction or gangrene, not specified as recurrent: Secondary | ICD-10-CM | POA: Diagnosis not present

## 2019-05-28 DIAGNOSIS — I10 Essential (primary) hypertension: Secondary | ICD-10-CM

## 2019-05-28 DIAGNOSIS — G8929 Other chronic pain: Secondary | ICD-10-CM

## 2019-05-28 DIAGNOSIS — E781 Pure hyperglyceridemia: Secondary | ICD-10-CM | POA: Diagnosis not present

## 2019-05-28 DIAGNOSIS — M5441 Lumbago with sciatica, right side: Secondary | ICD-10-CM

## 2019-05-28 LAB — LIPID PANEL

## 2019-05-28 MED ORDER — KETOROLAC TROMETHAMINE 60 MG/2ML IM SOLN
60.0000 mg | Freq: Once | INTRAMUSCULAR | Status: AC
  Administered 2019-05-28: 60 mg via INTRAMUSCULAR

## 2019-05-28 MED ORDER — CHLORTHALIDONE 25 MG PO TABS: 25.0000 mg | ORAL_TABLET | Freq: Every day | ORAL | 0 refills | Status: DC

## 2019-05-28 MED ORDER — ATORVASTATIN CALCIUM 40 MG PO TABS: 40.0000 mg | ORAL_TABLET | Freq: Every day | ORAL | 3 refills | Status: DC

## 2019-05-28 MED ORDER — METHYLPREDNISOLONE ACETATE 80 MG/ML IJ SUSP
80.0000 mg | Freq: Once | INTRAMUSCULAR | Status: AC
  Administered 2019-05-28: 80 mg via INTRAMUSCULAR

## 2019-05-28 NOTE — Progress Notes (Signed)
Subjective:  Patient ID: Charles Santos, male    DOB: Sep 16, 1971, 48 y.o.   MRN: [id number]  Chief Complaint:  Medical Management of Chronic Issues (3 month ), Back Pain, and Leg Pain   HPI: Charles Santos is a 48 y.o. male presenting on 05/28/2019 for Medical Management of Chronic Issues (3 month ), Back Pain, and Leg Pain   1. Hypertriglyceridemia  Pt did not start the Vascepa or Fish Oil as discussed. Triglycerides were 500 on 11/23/2018. Pt did not follow up in 3 months as discussed. He has not modified his diet. He does not exercise on a regular basis.    2. Essential hypertension  No previous diagnosis of HTN. Pts last blood pressures have been elevated or borderline. Pt does smoke on a regular basis. He denies headaches, leg swelling, chest pain, or dizziness. No confusion, weakness, palpitations, or dyspnea. He does not diet or exercise.    3. Chronic right-sided low back pain with right-sided sciatica  Ongoing for 6 months or more. Worse with lifting and certain movements. The pain is lower right sided with radiation into his right leg. Pt has been taking the Naproxen without complete relief of symptoms. No new injury. No weakness, loss of bowel or bladder, fever, chills, weight loss, or saddle anesthesia. Does have a history of colon caner so will complete imaging today.    4. Right inguinal hernia  Had repaired in the past. States he does have swelling in his right groin and testicle with heavy lifting. He denies symptoms at this time. No testicular pain or swelling.      Relevant past medical, surgical, family, and social history reviewed and updated as indicated.  Allergies and medications reviewed and updated.   Past Medical History:  Diagnosis Date  . Anxiety   . Cancer of transverse colon ([place]) 09/17/2016   surgery done  . Colon cancer [facility]) 1998   surgery and chemo  . Complication of anesthesia    not fully under for polyp removal at [place] 2017  .  Depression   . Emphysema of lung ([place])   . Family hx of colon cancer   . GERD (gastroesophageal reflux disease)    none recent  . Headache   . Hyperlipidemia   . Vitamin D deficiency     Past Surgical History:  Procedure Laterality Date  . APPENDECTOMY    . colon resectomy  1998   2 degree herida  . COLONOSCOPY    . ESOPHAGOGASTRODUODENOSCOPY (EGD) WITH PROPOFOL N/A 05/19/2017   Procedure: ESOPHAGOGASTRODUODENOSCOPY (EGD) WITH PROPOFOL;  Surgeon: [name], MD;  Location: [facility];  Service: Endoscopy;  Laterality: N/A;  . FLEXIBLE SIGMOIDOSCOPY N/A 05/19/2017   Procedure: FLEXIBLE SIGMOIDOSCOPY;  Surgeon: [name], MD;  Location: [facility];  Service: Endoscopy;  Laterality: N/A;  . INGUINAL HERNIA REPAIR    . LAPAROSCOPIC SUBTOTAL COLECTOMY N/A 09/17/2016   Procedure: DIAGNOSTIC LAPAROSCOPY EXPLORATORY LAPAROTOMY SUBTOTAL COLECTOMY PROCTOSCOPY;  Surgeon: [name], MD;  Location: [facility];  Service: General;  Laterality: N/A;  . polyp removal at [place]  2017  . PROCTOSCOPY  09/17/2016   Procedure: PROCTOSCOPY;  Surgeon: [name], MD;  Location: [facility];  Service: General;;  . UPPER GI ENDOSCOPY      Social History   Socioeconomic History  . Marital status: Legally Separated    Spouse name: Not on file  . Number of children: Not on file  . Years of education: Not  on file  . Highest education level: Not on file  Occupational History  . Not on file  Social Needs  . Financial resource strain: Not on file  . Food insecurity    Worry: Not on file    Inability: Not on file  . Transportation needs    Medical: Not on file    Non-medical: Not on file  Tobacco Use  . Smoking status: Current Every Day Smoker    Packs/day: 1.25    Years: 23.00    Pack years: 28.75    Types: Cigarettes  . Smokeless tobacco: Never Used  Substance and Sexual Activity  . Alcohol use: Yes    Alcohol/week: 68.0 standard drinks    Types: 12 Cans of beer, 56 Standard  drinks or equivalent per week    Comment: occ  . Drug use: No  . Sexual activity: Not on file  Lifestyle  . Physical activity    Days per week: Not on file    Minutes per session: Not on file  . Stress: Not on file  Relationships  . Social Herbalist on phone: Not on file    Gets together: Not on file    Attends religious service: Not on file    Active member of club or organization: Not on file    Attends meetings of clubs or organizations: Not on file    Relationship status: Not on file  . Intimate partner violence    Fear of current or ex partner: Not on file    Emotionally abused: Not on file    Physically abused: Not on file    Forced sexual activity: Not on file  Other Topics Concern  . Not on file  Social History Narrative  . Not on file    Outpatient Encounter Medications as of 05/28/2019  Medication Sig  . naproxen (NAPROSYN) 500 MG tablet TAKE (1) TABLET TWICE A DAY WITH A MEAL  . QUEtiapine (SEROQUEL) 100 MG tablet Take 1 tablet (100 mg total) by mouth at bedtime.  Marland Kitchen atorvastatin (LIPITOR) 40 MG tablet Take 1 tablet (40 mg total) by mouth daily.  . chlorthalidone (HYGROTON) 25 MG tablet Take 1 tablet (25 mg total) by mouth daily.  . [DISCONTINUED] azithromycin (ZITHROMAX) 250 MG tablet Take 2 tablets today and then 1 tablet day 2-5.  . [EXPIRED] ketorolac (TORADOL) injection 60 mg   . [EXPIRED] methylPREDNISolone acetate (DEPO-MEDROL) injection 80 mg    No facility-administered encounter medications on file as of 05/28/2019.     Allergies  Allergen Reactions  . Adhesive [Tape] Rash    Prefers paper tape    Review of Systems  Constitutional: Negative for activity change, appetite change, chills, diaphoresis, fatigue, fever and unexpected weight change.  Eyes: Negative for photophobia and visual disturbance.  Respiratory: Negative for cough, choking, chest tightness and shortness of breath.   Cardiovascular: Negative for chest pain, palpitations and  leg swelling.  Gastrointestinal: Negative for abdominal distention, abdominal pain, anal bleeding, blood in stool, constipation, diarrhea, nausea, rectal pain and vomiting.  Genitourinary: Negative for decreased urine volume, difficulty urinating, discharge, hematuria, penile pain, penile swelling, scrotal swelling and testicular pain.  Musculoskeletal: Positive for arthralgias, back pain and myalgias. Negative for gait problem, joint swelling, neck pain and neck stiffness.  Skin: Negative for color change and pallor.  Neurological: Negative for dizziness, tremors, seizures, syncope, facial asymmetry, speech difficulty, weakness, light-headedness, numbness and headaches.  Hematological: Does not bruise/bleed easily.  Psychiatric/Behavioral: Negative for  confusion.  All other systems reviewed and are negative.       Objective:  BP (!) 145/82   Pulse 71   Temp (!) 97.5 F (36.4 C) (Oral)   Ht 5' 10"  (1.778 m)   Wt 206 lb 9.6 oz (93.7 kg)   BMI 29.64 kg/m    Wt Readings from Last 3 Encounters:  05/28/19 206 lb 9.6 oz (93.7 kg)  12/16/18 194 lb (88 kg)  11/23/18 200 lb (90.7 kg)    Physical Exam Vitals signs and nursing note reviewed.  Constitutional:      General: He is not in acute distress.    Appearance: Normal appearance. He is well-developed and well-groomed. He is not ill-appearing, toxic-appearing or diaphoretic.  HENT:     Head: Normocephalic and atraumatic.     Jaw: There is normal jaw occlusion.     Right Ear: Hearing normal.     Left Ear: Hearing normal.     Nose: Nose normal.     Mouth/Throat:     Lips: Pink.     Mouth: Mucous membranes are moist.     Pharynx: Oropharynx is clear. Uvula midline.  Eyes:     General: Lids are normal.     Extraocular Movements: Extraocular movements intact.     Conjunctiva/sclera: Conjunctivae normal.     Pupils: Pupils are equal, round, and reactive to light.  Neck:     Musculoskeletal: Normal range of motion and neck supple.      Thyroid: No thyroid mass, thyromegaly or thyroid tenderness.     Vascular: No carotid bruit or JVD.     Trachea: Trachea and phonation normal.  Cardiovascular:     Rate and Rhythm: Normal rate and regular rhythm.     Chest Wall: PMI is not displaced.     Pulses: Normal pulses.     Heart sounds: Normal heart sounds. No murmur. No friction rub. No gallop.   Pulmonary:     Effort: Pulmonary effort is normal. No respiratory distress.     Breath sounds: Normal breath sounds. No wheezing.  Abdominal:     General: Bowel sounds are normal. There is no distension or abdominal bruit.     Palpations: Abdomen is soft. There is no hepatomegaly or splenomegaly.     Tenderness: There is no abdominal tenderness. There is no right CVA tenderness or left CVA tenderness.     Hernia: No hernia is present.  Musculoskeletal:     Lumbar back: He exhibits decreased range of motion, tenderness and pain. He exhibits no bony tenderness, no swelling, no edema, no deformity, no laceration, no spasm and normal pulse.       Back:     Right lower leg: No edema.     Left lower leg: No edema.     Comments: Positive right SLR test at 35 degrees.  Lymphadenopathy:     Cervical: No cervical adenopathy.  Skin:    General: Skin is warm and dry.     Capillary Refill: Capillary refill takes less than 2 seconds.     Coloration: Skin is not cyanotic, jaundiced or pale.     Findings: No rash.  Neurological:     General: No focal deficit present.     Mental Status: He is alert and oriented to person, place, and time.     Cranial Nerves: Cranial nerves are intact.     Sensory: Sensation is intact.     Motor: Motor function is intact.  Coordination: Coordination is intact.     Gait: Gait is intact.     Deep Tendon Reflexes: Reflexes are normal and symmetric.  Psychiatric:        Attention and Perception: Attention and perception normal.        Mood and Affect: Mood and affect normal.        Speech: Speech normal.         Behavior: Behavior normal. Behavior is cooperative.        Thought Content: Thought content normal.        Cognition and Memory: Cognition and memory normal.        Judgment: Judgment normal.     Results for orders placed or performed in visit on 12/16/18  Rapid Strep Screen ([facility] ONLY)   Specimen: Other   OTHER  Result Value Ref Range   Strep Gp A Ag, IA W/Reflex Negative Negative  Culture, Group A Strep   OTHER  Result Value Ref Range   Strep A Culture CANCELED   Veritor Flu A/B Waived  Result Value Ref Range   Influenza A Negative Negative   Influenza B Negative Negative     X-Ray: Lumbar spine: No acute findings. Preliminary x-ray reading by Monia Pouch, FNP-C, [facility].   Pertinent labs & imaging results that were available during my care of the patient were reviewed by me and considered in my medical decision making.  Assessment & Plan:  Kayen was seen today for medical management of chronic issues, back pain and leg pain.  Diagnoses and all orders for this visit:  Hypertriglyceridemia Pt is not taking fish oil as discussed and can not afford the Vascepa. Will start Lipitor today. Diet and exercise encouraged.  -     Lipid panel -     atorvastatin (LIPITOR) 40 MG tablet; Take 1 tablet (40 mg total) by mouth daily.  Essential hypertension Last few blood pressure readings have been elevated. DASH diet and exercise have not been beneficial. Will start on below. Labs pending. Diet and exercise encouraged. Recheck CMP and BP in 2 weeks.  -     CMP14+EGFR -     CBC with Differential/Platelet -     TSH -     chlorthalidone (HYGROTON) 25 MG tablet; Take 1 tablet (25 mg total) by mouth daily.  Chronic right-sided low back pain with right-sided sciatica Xray in office without changes from previous study in 2019. Can continue Naproxen as needed. Will refer to PT for ongoing symptoms. Symptomatic care discussed. Report any new or worsening symptoms. Does have a  history of colon cancer, so imaging completed today. No other red flag symptoms present today.  -     DG Lumbar Spine 2-3 Views; Future -     methylPREDNISolone acetate (DEPO-MEDROL) injection 80 mg -     ketorolac (TORADOL) injection 60 mg  Right inguinal hernia Ongoing symptoms. Will refer to general surgery. No hernia present today. Pt states he has recurrence of symptoms with heavy lifting.  -     Ambulatory referral to General Surgery     Continue all other maintenance medications.  Follow up plan: Return in about 2 weeks (around 06/11/2019), or if symptoms worsen or fail to improve, for HTN, CMP.   The above assessment and management plan was discussed with the patient. The patient verbalized understanding of and has agreed to the management plan. Patient is aware to call the clinic if symptoms persist or worsen. Patient is aware when  to return to the clinic for a follow-up visit. Patient educated on when it is appropriate to go to the emergency department.   Monia Pouch, FNP-C [facility] [phone]

## 2019-05-29 ENCOUNTER — Telehealth: Payer: Self-pay | Admitting: Family Medicine

## 2019-05-29 LAB — CMP14+EGFR
ALT: 21 IU/L (ref 0–44)
AST: 28 IU/L (ref 0–40)
Albumin/Globulin Ratio: 1.6 (ref 1.2–2.2)
Albumin: 4 g/dL (ref 4.0–5.0)
Alkaline Phosphatase: 59 IU/L (ref 39–117)
BUN/Creatinine Ratio: 18 (ref 9–20)
BUN: 18 mg/dL (ref 6–24)
Bilirubin Total: 0.2 mg/dL (ref 0.0–1.2)
CO2: 19 mmol/L — ABNORMAL LOW (ref 20–29)
Calcium: 9.3 mg/dL (ref 8.7–10.2)
Chloride: 103 mmol/L (ref 96–106)
Creatinine, Ser: 0.99 mg/dL (ref 0.76–1.27)
GFR calc Af Amer: 105 mL/min/{1.73_m2} (ref >59)
GFR calc non Af Amer: 91 mL/min/{1.73_m2} (ref >59)
Globulin, Total: 2.5 g/dL (ref 1.5–4.5)
Glucose: 90 mg/dL (ref 65–99)
Potassium: 3.9 mmol/L (ref 3.5–5.2)
Sodium: 137 mmol/L (ref 134–144)
Total Protein: 6.5 g/dL (ref 6.0–8.5)

## 2019-05-29 LAB — CBC WITH DIFFERENTIAL/PLATELET
Basophils Absolute: 0.1 10*3/uL (ref 0.0–0.2)
Basos: 1 %
EOS (ABSOLUTE): 0.2 10*3/uL (ref 0.0–0.4)
Eos: 4 %
Hematocrit: 46.8 % (ref 37.5–51.0)
Hemoglobin: 16.4 g/dL (ref 13.0–17.7)
Immature Grans (Abs): 0 10*3/uL (ref 0.0–0.1)
Immature Granulocytes: 0 %
Lymphocytes Absolute: 1.6 10*3/uL (ref 0.7–3.1)
Lymphs: 23 %
MCH: 34.2 pg — ABNORMAL HIGH (ref 26.6–33.0)
MCHC: 35 g/dL (ref 31.5–35.7)
MCV: 98 fL — ABNORMAL HIGH (ref 79–97)
Monocytes Absolute: 0.7 10*3/uL (ref 0.1–0.9)
Monocytes: 10 %
Neutrophils Absolute: 4.2 10*3/uL (ref 1.4–7.0)
Neutrophils: 62 %
Platelets: 181 10*3/uL (ref 150–450)
RBC: 4.8 x10E6/uL (ref 4.14–5.80)
RDW: 12.9 % (ref 11.6–15.4)
WBC: 6.8 10*3/uL (ref 3.4–10.8)

## 2019-05-29 LAB — LIPID PANEL
Chol/HDL Ratio: 8.7 ratio — ABNORMAL HIGH (ref 0.0–5.0)
Cholesterol, Total: 279 mg/dL — ABNORMAL HIGH (ref 100–199)
HDL: 32 mg/dL — ABNORMAL LOW (ref >39)
Triglycerides: 2032 mg/dL (ref 0–149)

## 2019-05-29 LAB — TSH: TSH: 1.5 u[IU]/mL (ref 0.450–4.500)

## 2019-05-29 MED ORDER — GEMFIBROZIL 600 MG PO TABS: 600.0000 mg | ORAL_TABLET | Freq: Two times a day (BID) | ORAL | 3 refills | Status: DC

## 2019-05-29 NOTE — Addendum Note (Signed)
Addended by: Baruch Gouty on: 05/29/2019 07:59 AM   Modules accepted: Orders

## 2019-06-04 ENCOUNTER — Encounter: Payer: Self-pay | Admitting: Gastroenterology

## 2019-06-11 ENCOUNTER — Ambulatory Visit: Payer: Managed Care, Other (non HMO) | Admitting: Family Medicine

## 2019-06-15 ENCOUNTER — Other Ambulatory Visit: Payer: Self-pay | Admitting: Family Medicine

## 2019-06-15 DIAGNOSIS — M5441 Lumbago with sciatica, right side: Secondary | ICD-10-CM

## 2019-06-15 NOTE — Telephone Encounter (Signed)
Rakes patient.  Seen 6/15 please advise

## 2019-06-19 ENCOUNTER — Ambulatory Visit: Payer: Managed Care, Other (non HMO) | Admitting: General Surgery

## 2019-06-25 ENCOUNTER — Encounter: Payer: Self-pay | Admitting: Family Medicine

## 2019-06-25 ENCOUNTER — Telehealth: Payer: Self-pay | Admitting: Family Medicine

## 2019-06-25 ENCOUNTER — Other Ambulatory Visit: Payer: Self-pay

## 2019-06-25 ENCOUNTER — Ambulatory Visit (INDEPENDENT_AMBULATORY_CARE_PROVIDER_SITE_OTHER): Payer: Managed Care, Other (non HMO) | Admitting: Family Medicine

## 2019-06-25 VITALS — BP 116/65 | HR 81 | Temp 97.6°F | Ht 70.0 in | Wt 204.0 lb

## 2019-06-25 DIAGNOSIS — I10 Essential (primary) hypertension: Secondary | ICD-10-CM | POA: Diagnosis not present

## 2019-06-25 MED ORDER — CHLORTHALIDONE 25 MG PO TABS: 25.0000 mg | ORAL_TABLET | Freq: Every day | ORAL | 1 refills | Status: DC

## 2019-06-25 NOTE — Progress Notes (Signed)
    Subjective:    Patient here for follow-up of elevated blood pressure.  He is exercising and is adherent to a low-salt diet.  Blood pressure is well controlled at home. Cardiac symptoms: none. Patient denies: chest pain, claudication, dyspnea, exertional chest pressure/discomfort, fatigue, irregular heart beat, lower extremity edema and palpitations. Cardiovascular risk factors: dyslipidemia, hypertension, male gender, obesity (BMI >= 30 kg/m2) and smoking/ tobacco exposure. Use of agents associated with hypertension: none. History of target organ damage: none.  The following portions of the patient's history were reviewed and updated as appropriate: allergies, current medications, past family history, past medical history, past social history, past surgical history and problem list.  Review of Systems Pertinent items noted in HPI and remainder of comprehensive ROS otherwise negative.     Objective:    BP 116/65   Pulse 81   Temp 97.6 F (36.4 C) (Oral)   Ht 5\' 10"  (1.778 m)   Wt 204 lb (92.5 kg)   BMI 29.27 kg/m  General appearance: alert, cooperative, appears stated age and no distress Head: Normocephalic, without obvious abnormality, atraumatic Neck: no adenopathy, no carotid bruit, no JVD, supple, symmetrical, trachea midline and thyroid not enlarged, symmetric, no tenderness/mass/nodules Lungs: clear to auscultation bilaterally Heart: regular rate and rhythm, S1, S2 normal, no murmur, click, rub or gallop Extremities: extremities normal, atraumatic, no cyanosis or edema Pulses: 2+ and symmetric Skin: Skin color, texture, turgor normal. No rashes or lesions Neurologic: Grossly normal    Assessment:   Charles Santos was seen today for medication management and hypertension.  Diagnoses and all orders for this visit:  Essential hypertension Well controlled since initiation of chlorthalidone. Will recheck CMP today. DASH diet and exercise encouraged. Follow up in 3 months.  -      Comprehensive metabolic panel -     chlorthalidone (HYGROTON) 25 MG tablet; Take 1 tablet (25 mg total) by mouth daily.    Plan:    Medication: no change. Dietary sodium restriction. Regular aerobic exercise. Follow up: 3 months and as needed.    The above assessment and management plan was discussed with the patient. The patient verbalized understanding of and has agreed to the management plan. Patient is aware to call the clinic if symptoms fail to improve or worsen. Patient is aware when to return to the clinic for a follow-up visit. Patient educated on when it is appropriate to go to the emergency department.   Monia Pouch, FNP-C [facility] [address] [place], [place] [address] [phone]

## 2019-06-25 NOTE — Telephone Encounter (Signed)
noted 

## 2019-06-25 NOTE — Patient Instructions (Signed)

## 2019-06-26 ENCOUNTER — Encounter: Payer: Self-pay | Admitting: General Surgery

## 2019-06-26 ENCOUNTER — Ambulatory Visit: Payer: Managed Care, Other (non HMO) | Admitting: General Surgery

## 2019-06-26 VITALS — BP 114/75 | HR 79 | Temp 98.6°F | Resp 16 | Ht 70.0 in | Wt 204.0 lb

## 2019-06-26 DIAGNOSIS — K4091 Unilateral inguinal hernia, without obstruction or gangrene, recurrent: Secondary | ICD-10-CM

## 2019-06-26 LAB — COMPREHENSIVE METABOLIC PANEL
ALT: 23 IU/L (ref 0–44)
AST: 30 IU/L (ref 0–40)
Albumin/Globulin Ratio: 1.9 (ref 1.2–2.2)
Albumin: 4.1 g/dL (ref 4.0–5.0)
Alkaline Phosphatase: 67 IU/L (ref 39–117)
BUN/Creatinine Ratio: 12 (ref 9–20)
BUN: 14 mg/dL (ref 6–24)
Bilirubin Total: 0.7 mg/dL (ref 0.0–1.2)
CO2: 25 mmol/L (ref 20–29)
Calcium: 9.4 mg/dL (ref 8.7–10.2)
Chloride: 100 mmol/L (ref 96–106)
Creatinine, Ser: 1.2 mg/dL (ref 0.76–1.27)
GFR calc Af Amer: 83 mL/min/{1.73_m2} (ref >59)
GFR calc non Af Amer: 72 mL/min/{1.73_m2} (ref >59)
Globulin, Total: 2.2 g/dL (ref 1.5–4.5)
Glucose: 103 mg/dL — ABNORMAL HIGH (ref 65–99)
Potassium: 3.8 mmol/L (ref 3.5–5.2)
Sodium: 142 mmol/L (ref 134–144)
Total Protein: 6.3 g/dL (ref 6.0–8.5)

## 2019-06-26 NOTE — Progress Notes (Signed)
[facility] History and Physical  Reason for Referral: Right inguinal hernia  Referring Physician:  Baruch Gouty, FNP   Chief Complaint    Pre-op Exam      Charles Santos is a 47 y.o. male.  HPI: Charles Santos is a 48 yo with a history of suspected [name], 2 prior colon cancers s/p right hemicolectomy and a subsequent subtotal colectomy after his most recent transverse colon cancer in 2017.  He has had a prior right inguinal hernia repair in [place] around 2001, and says that he has been having some right groin with BMs and bearing down. He says that it is random and he has not noticed a bulge.  He says that the pain has been coming on and off for the last few months. 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  He denies ever having a bulge on the left and no left sided pain. He has not had any issues with pain prior to this new episodes after his hernia repair in 2001. He is unsure if they used mesh.   Past Medical History:  Diagnosis Date  . Anxiety   . Cancer of transverse colon ([place]) 09/17/2016   surgery done  . Colon cancer [facility]) 1998   surgery and chemo  . Complication of anesthesia    not fully under for polyp removal at [place] 2017  . Depression   . Emphysema of lung ([place])   . Family hx of colon cancer   . GERD (gastroesophageal reflux disease)    none recent  . Headache   . Hyperlipidemia   . Hypertension   . Vitamin D deficiency     Past Surgical History:  Procedure Laterality Date  . APPENDECTOMY    . colon resectomy  1998   2 degree herida  . COLONOSCOPY    . ESOPHAGOGASTRODUODENOSCOPY (EGD) WITH PROPOFOL N/A 05/19/2017   Procedure: ESOPHAGOGASTRODUODENOSCOPY (EGD) WITH PROPOFOL;  Surgeon: [name], MD;  Location: [facility];  Service: Endoscopy;  Laterality: N/A;  . FLEXIBLE SIGMOIDOSCOPY N/A 05/19/2017   Procedure: FLEXIBLE SIGMOIDOSCOPY;  Surgeon: [name], MD;  Location: [facility];  Service:  Endoscopy;  Laterality: N/A;  . INGUINAL HERNIA REPAIR    . LAPAROSCOPIC SUBTOTAL COLECTOMY N/A 09/17/2016   Procedure: DIAGNOSTIC LAPAROSCOPY EXPLORATORY LAPAROTOMY SUBTOTAL COLECTOMY PROCTOSCOPY;  Surgeon: [name], MD;  Location: [facility];  Service: General;  Laterality: N/A;  . polyp removal at [place]  2017  . PROCTOSCOPY  09/17/2016   Procedure: PROCTOSCOPY;  Surgeon: [name], MD;  Location: [facility];  Service: General;;  . UPPER GI ENDOSCOPY      Family History  Problem Relation Age of Onset  . Colon cancer Father        pt thinks he was in his 51's when dx  . Esophageal cancer Neg Hx   . Rectal cancer Neg Hx   . Stomach cancer Neg Hx   . Prostate cancer Neg Hx     Social History   Tobacco Use  . Smoking status: Current Every Day Smoker    Packs/day: 1.25    Years: 23.00    Pack years: 28.75    Types: Cigarettes  . Smokeless tobacco: Never Used  Substance Use Topics  . Alcohol use: Yes    Alcohol/week: 68.0 standard drinks    Types: 12 Cans of beer, 56 Standard drinks or equivalent per week    Comment: occ  .  Drug use: No    Medications: I have reviewed the patient's current medications. Allergies as of 06/26/2019      Reactions   Adhesive [tape] Rash   Prefers paper tape      Medication List       Accurate as of June 26, 2019 10:32 AM. If you have any questions, ask your nurse or doctor.        atorvastatin 40 MG tablet Commonly known as: LIPITOR Take 1 tablet (40 mg total) by mouth daily.   chlorthalidone 25 MG tablet Commonly known as: HYGROTON Take 1 tablet (25 mg total) by mouth daily.   Fish Oil 1000 MG Cpdr Take 2 capsules by mouth daily.   gemfibrozil 600 MG tablet Commonly known as: Lopid Take 1 tablet (600 mg total) by mouth 2 (two) times daily before a meal for 30 days.   naproxen 500 MG tablet Commonly known as: NAPROSYN TAKE (1) TABLET TWICE A DAY WITH A MEAL   QUEtiapine 100 MG tablet Commonly known as: SEROquel  Take 1 tablet (100 mg total) by mouth at bedtime.        ROS:  A comprehensive review of systems was negative except for: Cardiovascular: positive for HTN Musculoskeletal: positive for back pain and lower leg pain  Blood pressure 114/75, pulse 79, temperature 98.6 F (37 C), temperature source Temporal, resp. rate 16, height 5\' 10"  (1.778 m), weight 204 lb (92.5 kg), SpO2 94 %. Physical Exam Vitals signs reviewed.  HENT:     Head: Normocephalic.     Nose: Nose normal.  Eyes:     Extraocular Movements: Extraocular movements intact.     Pupils: Pupils are equal, round, and reactive to light.  Neck:     Musculoskeletal: Normal range of motion.  Cardiovascular:     Rate and Rhythm: Normal rate.  Pulmonary:     Effort: Pulmonary effort is normal.     Breath sounds: Normal breath sounds.  Abdominal:     General: There is no distension.     Palpations: Abdomen is soft.     Tenderness: There is no abdominal tenderness.     Hernia: A hernia is present. Hernia is present in the right inguinal area. There is no hernia in the left inguinal area.     Comments: Midline incision healed, no obvious hernia; difficult to appreciate right inguinal hernia recurrence, patient examined with RN Charles Santos in room, only with deep valsalva did I appreciate any bulging but this was very limited, no pain of the scrotum or epidiymis with palpation   Neurological:     Mental Status: He is alert.     Results: Results for orders placed or performed in visit on 06/25/19 (from the past 48 hour(s))  Comprehensive metabolic panel     Status: Abnormal   Collection Time: 06/25/19 12:05 PM  Result Value Ref Range   Glucose 103 (H) 65 - 99 mg/dL   BUN 14 6 - 24 mg/dL   Creatinine, Ser 1.20 0.76 - 1.27 mg/dL   GFR calc non Af Amer 72 >59 mL/min/1.73   GFR calc Af Amer 83 >59 mL/min/1.73   BUN/Creatinine Ratio 12 9 - 20   Sodium 142 134 - 144 mmol/L   Potassium 3.8 3.5 - 5.2 mmol/L   Chloride 100 96 -  106 mmol/L   CO2 25 20 - 29 mmol/L   Calcium 9.4 8.7 - 10.2 mg/dL   Total Protein 6.3 6.0 - 8.5 g/dL   Albumin  4.1 4.0 - 5.0 g/dL   Globulin, Total 2.2 1.5 - 4.5 g/dL   Albumin/Globulin Ratio 1.9 1.2 - 2.2   Bilirubin Total 0.7 0.0 - 1.2 mg/dL   Alkaline Phosphatase 67 39 - 117 IU/L   AST 30 0 - 40 IU/L   ALT 23 0 - 44 IU/L    CT a/p 2018 -no left inguinal hernia, some thickening over the right side but no obvious mesh, bulging of the fat deep but no true defect noted then   Assessment & Plan:  Charles Santos is a 48 y.o. male with a small recurrence of his inguinal hernia possibly on the right.  We have discussed that this could have some fat coming down through the hernia from the prior repair. We discussed that some of the pain could also be from scaring. We discussed redoing the repair with mesh and the use of mesh to prevent recurrence. We discussed the risk of bleeding, infection, and nerve injury after surgery. We discussed the risk of injury to the testicle. We discussed that he could still have this pain following this surgery as some people have chronic pain. We discussed that he would likely not be a great candidate for a laparoscopic repair given this extensive abdominal surgery history and scarring.   -Discussed risk of incarceration and strangulation and signs and symptoms to watch out for -Patient is going to see how he feels over the next few weeks and let us know if he would like to proceed with repair   All questions were answered to the satisfaction of the patient.  Virl Cagey 06/26/2019, 10:32 AM

## 2019-06-26 NOTE — Patient Instructions (Signed)
Open Hernia Repair, Adult  Open hernia repair is a surgical procedure to fix a hernia. A hernia occurs when an internal organ or tissue pushes out through a weak spot in the abdominal wall muscles. Hernias commonly occur in the groin and around the navel. Most hernias tend to get worse over time. Often, surgery is done to prevent the hernia from becoming bigger, uncomfortable, or an emergency. Emergency surgery may be needed if abdominal contents get stuck in the opening (incarcerated hernia) or the blood supply gets cut off (strangulated hernia). In an open repair, an incision is made in the abdomen to perform the surgery. Tell a health care provider about:  Any allergies you have.  All medicines you are taking, including vitamins, herbs, eye drops, creams, and over-the-counter medicines.  Any problems you or family members have had with anesthetic medicines.  Any blood or bone disorders you have.  Any surgeries you have had.  Any medical conditions you have, including any recent cold or flu symptoms.  Whether you are pregnant or may be pregnant. What are the risks? Generally, this is a safe procedure. However, problems may occur, including:  Long-lasting (chronic) pain.  Bleeding.  Infection.  Damage to the testicle. This can cause shrinking or swelling.  Damage to the bladder, blood vessels, intestine, or nerves near the hernia.  Trouble passing urine.  Allergic reactions to medicines.  Return of the hernia. Medicines  Ask your health care provider about: ? Changing or stopping your regular medicines. This is especially important if you are taking diabetes medicines or blood thinners. ? Taking medicines such as aspirin and ibuprofen. These medicines can thin your blood. Do not take these medicines before your procedure if your health care provider instructs you not to.  You may be given antibiotic medicine to help prevent infection. General instructions  You may have  blood tests or imaging studies.  Ask your health care provider how your surgical site will be marked or identified.  If you smoke, do not smoke for at least 2 weeks before your procedure or for as long as told by your health care provider.  Let your health care provider know if you develop a cold or any infection before your surgery.  Plan to have someone take you home from the hospital or clinic.  If you will be going home right after the procedure, plan to have someone with you for 24 hours. What happens during the procedure?  To reduce your risk of infection: ? Your health care team will wash or sanitize their hands. ? Your skin will be washed with soap. ? Hair may be removed from the surgical area.  An IV tube will be inserted into one of your veins.  You will be given one or more of the following: ? A medicine to help you relax (sedative). ? A medicine to numb the area (local anesthetic). ? A medicine to make you fall asleep (general anesthetic).  Your surgeon will make an incision over the hernia.  The tissues of the hernia will be moved back into place.  The edges of the hernia may be stitched together.  The opening in the abdominal muscles will be closed with stitches (sutures). Or, your surgeon will place a mesh patch made of manmade (synthetic) material over the opening.  The incision will be closed.  A bandage (dressing) may be placed over the incision. The procedure may vary among health care providers and hospitals. What happens after the procedure?  Your blood pressure, heart rate, breathing rate, and blood oxygen level will be monitored until the medicines you were given have worn off.  You may be given medicine for pain.  Do not drive for 24 hours if you received a sedative. This information is not intended to replace advice given to you by your health care provider. Make sure you discuss any questions you have with your health care provider. Document  Released: 05/25/2001 Document Revised: 11/11/2017 Document Reviewed: 05/12/2016 Elsevier Patient Education  2020 [employer]. Inguinal Hernia, Adult An inguinal hernia is when fat or your intestines push through a weak spot in a muscle where your leg meets your lower belly (groin). This causes a rounded lump (bulge). This kind of hernia could also be:  In your scrotum, if you are male.  In folds of skin around your vagina, if you are male. There are three types of inguinal hernias. These include:  Hernias that can be pushed back into the belly (are reducible). This type rarely causes pain.  Hernias that cannot be pushed back into the belly (are incarcerated).  Hernias that cannot be pushed back into the belly and lose their blood supply (are strangulated). This type needs emergency surgery. If you do not have symptoms, you may not need treatment. If you have symptoms or a large hernia, you may need surgery. Follow these instructions at home: Lifestyle  Do these things if told by your doctor so you do not have trouble pooping (constipation): ? Drink enough fluid to keep your pee (urine) pale yellow. ? Eat foods that have a lot of fiber. These include fresh fruits and vegetables, whole grains, and beans. ? Limit foods that are high in fat and processed sugars. These include foods that are fried or sweet. ? Take medicine for trouble pooping.  Avoid lifting heavy objects.  Avoid standing for long amounts of time.  Do not use any products that contain nicotine or tobacco. These include cigarettes and e-cigarettes. If you need help quitting, ask your doctor.  Stay at a healthy weight. General instructions  You may try to push your hernia in by very gently pressing on it when you are lying down. Do not try to force the bulge back in if it will not push in easily.  Watch your hernia for any changes in shape, size, or color. Tell your doctor if you see any changes.  Take  over-the-counter and prescription medicines only as told by your doctor.  Keep all follow-up visits as told by your doctor. This is important. Contact a doctor if:  You have a fever.  You have new symptoms.  Your symptoms get worse. Get help right away if:  The area where your leg meets your lower belly has: ? Pain that gets worse suddenly. ? A bulge that gets bigger suddenly, and it does not get smaller after that. ? A bulge that turns red or purple. ? A bulge that is painful when you touch it.  You are a man, and your scrotum: ? Suddenly feels painful. ? Suddenly changes in size.  You cannot push the hernia in by very gently pressing on it when you are lying down. Do not try to force the bulge back in if it will not push in easily.  You feel sick to your stomach (nauseous), and that feeling does not go away.  You throw up (vomit), and that keeps happening.  You have a fast heartbeat.  You cannot poop (have a bowel  movement) or pass gas. These symptoms may be an emergency. Do not wait to see if the symptoms will go away. Get medical help right away. Call your local emergency services (911 in the [place] Summary  An inguinal hernia is when fat or your intestines push through a weak spot in a muscle where your leg meets your lower belly (groin). This causes a rounded lump (bulge).  If you do not have symptoms, you may not need treatment. If you have symptoms or a large hernia, you may need surgery.  Avoid lifting heavy objects. Also avoid standing for long amounts of time.  Do not try to force the bulge back in if it will not push in easily. This information is not intended to replace advice given to you by your health care provider. Make sure you discuss any questions you have with your health care provider. Document Released: 12/30/2006 Document Revised: 12/31/2017 Document Reviewed: 08/31/2017 Elsevier Patient Education  2020 [employer].

## 2019-07-19 ENCOUNTER — Telehealth: Payer: Self-pay | Admitting: Family Medicine

## 2019-07-19 NOTE — Telephone Encounter (Signed)
Pt aware.

## 2019-07-19 NOTE — Telephone Encounter (Signed)
Eat with foods. Try a bland diet. Report continued symptoms.

## 2019-07-19 NOTE — Telephone Encounter (Signed)
Patient states that he has been feeling nauseous for the last week.  Advised patient to not take medication on an empty stomach.  Patient states that he eats a meal before taking any medication.  No changes to diet, bm or vomiting

## 2019-07-23 ENCOUNTER — Other Ambulatory Visit: Payer: Self-pay | Admitting: Family Medicine

## 2019-07-23 DIAGNOSIS — M5441 Lumbago with sciatica, right side: Secondary | ICD-10-CM

## 2019-08-28 ENCOUNTER — Other Ambulatory Visit: Payer: Self-pay | Admitting: Family Medicine

## 2019-08-28 DIAGNOSIS — M5441 Lumbago with sciatica, right side: Secondary | ICD-10-CM

## 2019-08-30 ENCOUNTER — Telehealth: Payer: Self-pay | Admitting: Gastroenterology

## 2019-08-30 NOTE — Telephone Encounter (Signed)
Hi [name], could you pls clarify the type of procedure that pt is due for? There is a recall for a flex sig and also for a colon. Thank you.

## 2019-08-30 NOTE — Telephone Encounter (Signed)
There is a note on the recall that says Flex.  Looks like he can also have an EGD at the same time.  Thank you

## 2019-08-31 ENCOUNTER — Encounter: Payer: Self-pay | Admitting: Gastroenterology

## 2019-08-31 NOTE — Telephone Encounter (Signed)
Left msag to cb to sch flex sig and Egd.

## 2019-09-12 ENCOUNTER — Ambulatory Visit (AMBULATORY_SURGERY_CENTER): Payer: Managed Care, Other (non HMO) | Admitting: *Deleted

## 2019-09-12 ENCOUNTER — Other Ambulatory Visit: Payer: Self-pay

## 2019-09-12 VITALS — Ht 70.0 in | Wt 200.0 lb

## 2019-09-12 DIAGNOSIS — Z1509 Genetic susceptibility to other malignant neoplasm: Secondary | ICD-10-CM

## 2019-09-12 DIAGNOSIS — Z85038 Personal history of other malignant neoplasm of large intestine: Secondary | ICD-10-CM

## 2019-09-12 NOTE — Progress Notes (Signed)
Patient's pre-visit was done today over the phone with the patient due to COVID-19 pandemic. Name,DOB and address verified. Insurance verified. Packet of Prep instructions mailed to patient including copy of a consent form-pt is aware. Patient understands to call us back with any questions or concerns.   Pt is aware that care partner will wait in the car during procedure; if they feel like they will be too hot to wait in the car; they may wait in the lobby. Patient is aware to bring only one care partner. We want them to wear a mask (we do not have any that we can provide them), practice social distancing, and we will check their temperatures when they get here.  I did remind patient that their care partner needs to stay in the parking lot the entire time. Pt will wear mask into building.

## 2019-09-14 ENCOUNTER — Encounter: Payer: Self-pay | Admitting: Gastroenterology

## 2019-09-22 ENCOUNTER — Other Ambulatory Visit: Payer: Self-pay | Admitting: Family Medicine

## 2019-09-22 DIAGNOSIS — M5441 Lumbago with sciatica, right side: Secondary | ICD-10-CM

## 2019-09-25 ENCOUNTER — Other Ambulatory Visit: Payer: Self-pay

## 2019-09-26 ENCOUNTER — Encounter: Payer: Self-pay | Admitting: Family Medicine

## 2019-09-26 ENCOUNTER — Ambulatory Visit: Payer: Managed Care, Other (non HMO) | Admitting: Family Medicine

## 2019-09-26 VITALS — BP 128/80 | HR 75 | Temp 97.5°F | Resp 20 | Ht 70.0 in | Wt 204.0 lb

## 2019-09-26 DIAGNOSIS — Z716 Tobacco abuse counseling: Secondary | ICD-10-CM

## 2019-09-26 DIAGNOSIS — E781 Pure hyperglyceridemia: Secondary | ICD-10-CM | POA: Diagnosis not present

## 2019-09-26 DIAGNOSIS — M5441 Lumbago with sciatica, right side: Secondary | ICD-10-CM | POA: Diagnosis not present

## 2019-09-26 DIAGNOSIS — I10 Essential (primary) hypertension: Secondary | ICD-10-CM | POA: Insufficient documentation

## 2019-09-26 DIAGNOSIS — G8929 Other chronic pain: Secondary | ICD-10-CM

## 2019-09-26 DIAGNOSIS — Z720 Tobacco use: Secondary | ICD-10-CM

## 2019-09-26 MED ORDER — NAPROXEN 500 MG PO TABS: ORAL_TABLET | ORAL | 0 refills | Status: DC

## 2019-09-26 MED ORDER — PREDNISONE 20 MG PO TABS: 40.0000 mg | ORAL_TABLET | Freq: Every day | ORAL | 0 refills | Status: AC

## 2019-09-26 MED ORDER — METHYLPREDNISOLONE ACETATE 80 MG/ML IJ SUSP
40.0000 mg | Freq: Once | INTRAMUSCULAR | Status: AC
  Administered 2019-09-26: 12:00:00 40 mg via INTRAMUSCULAR

## 2019-09-26 MED ORDER — NICOTINE 21 MG/24HR TD PT24: 21.0000 mg | MEDICATED_PATCH | Freq: Every day | TRANSDERMAL | 0 refills | Status: DC

## 2019-09-26 NOTE — Progress Notes (Addendum)
Charles Santos is a 48 yo male who presents for a 3 mo follow up for HTN, hypertriglyceridemia, chronic low back pain with sciatica, and tobacco use. He has an endoscopy and colonoscopy scheduled for 09/28/19.  1. Hypertension Complaint with meds - Yes Current Medications - chlorthalidone 25 mg Checking BP at home ranging: doesn't check BP at home Exercising Regularly - 2-3x a week by walking, does manual work with heavy lifting  Watching Salt intake - Yes Pertinent ROS:  Headache - No Fatigue - No Visual Disturbances - No Chest pain - No Dyspnea - No Palpitations - No LE edema - No They report good compliance with medications and can restate their regimen by memory. No medication side effects.  2. Hypertriglyceridemia  Compliant with medications - Yes Current medications - Lipitor 40 mg, gemfibrozil 600 mg   Side effects from medications - No Diet - trying to avoid fried, greasy foods  Exercise - 2-3x a week by walking, does manual work with heavy lifting  3. Chronic right-sided low back pain with sciatica Reports some improvement in back pain with use of Naproxen 500 mg BID, but continues to have lower right sided back pain that radiates down right leg with numbness and tingling. Pain 8/10 at times. Pain is worse with movement and lifting at works; improves with rest. Also improves with stretching. Denies changes in bowel or bladder control, denies saddle anesthesia.   4. Tobacco use  Would like to try a nicotine patch to assist with smoking cessation. Has tried a patch a few years ago while in the hospital and did not have any side effects. He currently smokes 1 PPD.  Lab Results  Component Value Date   CHOL 279 (H) 05/28/2019   HDL 32 (L) 05/28/2019   Charles Santos Comment 05/28/2019   LDLDIRECT 34 12/31/2016   TRIG 2,032 ([place]) 05/28/2019   CHOLHDL 8.7 (H) 05/28/2019     Family and personal medical history reviewed. Smoking and ETOH history reviewed.   Family, social, and  smoking history reviewed.   Review of Systems  Constitutional: Negative for appetite change, fatigue, fever and unexpected weight change.  HENT: Negative for sore throat, trouble swallowing and voice change.   Eyes: Negative for visual disturbance.  Respiratory: Negative for shortness of breath and wheezing.   Cardiovascular: Negative for chest pain, palpitations and leg swelling.  Gastrointestinal: Positive for diarrhea (baseline). Negative for abdominal pain, blood in stool, constipation, nausea and vomiting.  Endocrine: Negative for cold intolerance and heat intolerance.  Genitourinary: Negative for decreased urine volume, difficulty urinating and dysuria.  Musculoskeletal: Positive for back pain. Negative for joint swelling and myalgias.  Skin: Negative for color change, rash and wound.  Neurological: Positive for numbness (right leg with back pain). Negative for dizziness, syncope, weakness, light-headedness and headaches.  Psychiatric/Behavioral: Negative for dysphoric mood, hallucinations and sleep disturbance.   Physical Exam Vitals signs and nursing note reviewed.  Constitutional:      General: He is not in acute distress.    Appearance: Normal appearance. He is not ill-appearing, toxic-appearing or diaphoretic.  HENT:     Head: Normocephalic and atraumatic.     Right Ear: Tympanic membrane and ear canal normal.     Left Ear: Tympanic membrane and ear canal normal.     Nose: Nose normal.     Mouth/Throat:     Mouth: Mucous membranes are moist.     Pharynx: Oropharynx is clear.  Eyes:     Extraocular Movements: Extraocular  movements intact.     Pupils: Pupils are equal, round, and reactive to light.  Neck:     Musculoskeletal: Normal range of motion and neck supple. No muscular tenderness.  Cardiovascular:     Rate and Rhythm: Normal rate and regular rhythm.     Heart sounds: Normal heart sounds. No murmur.  Pulmonary:     Effort: Pulmonary effort is normal.     Breath  sounds: Normal breath sounds.  Abdominal:     General: Bowel sounds are normal.     Palpations: Abdomen is soft.  Musculoskeletal: Normal range of motion.     Lumbar back: He exhibits normal range of motion, no bony tenderness, no swelling, no edema, no deformity and no laceration.       Back:     Right lower leg: No edema.     Left lower leg: No edema.  Skin:    General: Skin is warm and dry.     Capillary Refill: Capillary refill takes less than 2 seconds.  Neurological:     Mental Status: He is alert and oriented to person, place, and time. Mental status is at baseline.     Motor: No weakness.     Gait: Gait normal.  Psychiatric:        Mood and Affect: Mood normal.        Behavior: Behavior normal.        Thought Content: Thought content normal.        Judgment: Judgment normal.    BP Readings from Last 3 Encounters:  09/26/19 128/80  06/26/19 114/75  06/25/19 116/65   CMP Latest Ref Rng & Units 06/25/2019 05/28/2019 11/23/2018  Glucose 65 - 99 mg/dL 103(H) 90 88  BUN 6 - 24 mg/dL _0 Creatinine 0.76 - 1.27 mg/dL 1.20 0.99 0.85  Sodium 134 - 144 mmol/L 142 137 142  Potassium 3.5 - 5.2 mmol/L 3.8 3.9 4.0  Chloride 96 - 106 mmol/L 100 103 103  CO2 20 - 29 mmol/L 25 19(L) 23  Calcium 8.7 - 10.2 mg/dL 9.4 9.3 8.9  Total Protein 6.0 - 8.5 g/dL 6.3 6.5 6.1  Total Bilirubin 0.0 - 1.2 mg/dL 0.7 0.2 0.3  Alkaline Phos 39 - 117 IU/L 67 59 68  AST 0 - 40 IU/L _1 ALT 0 - 44 IU/L _2 Charles Santos was seen today for medical management of chronic issues, hypertension and hyperlipidemia.  Diagnoses and all orders for this visit:  Essential hypertension BP well controlled. Changes were not made in regimen today. Goal BP is 130/80. Pt aware to report any persistent high or low readings. DASH diet and exercise encouraged. Exercise at least 150 minutes per week and increase as tolerated. Goal BMI > 25. Stress management encouraged. Avoid nicotine and tobacco product use.  Avoid excessive alcohol and NSAID's. Avoid more than 2000 mg of sodium daily. Medications as prescribed. Follow up as scheduled.  -     CBC with Differential/Platelet -     CMP14+EGFR  Hypertriglyceridemia Diet encouraged - increase intake of fresh fruits and vegetables, increase intake of lean proteins. Bake, broil, or grill foods. Avoid fried, greasy, and fatty foods. Avoid fast foods. Increase intake of fiber-rich whole grains. Exercise encouraged - at least 150 minutes per week and advance as tolerated.  Goal BMI < 25. Continue medications as prescribed. Follow up in 3-6 months as discussed.  -     CBC with Differential/Platelet -  Lipid panel  Chronic right-sided low back pain with right-sided sciatica Treatment as below. Referral to PT. No red flags concerning for Cauda Equina Syndrome. Continue naproxen 500 mg BID as needed. Notify for new or worsening symptoms. -     methylPREDNISolone acetate (DEPO-MEDROL) injection 40 mg -     predniSONE (DELTASONE) 20 MG tablet; Take 2 tablets (40 mg total) by mouth daily with breakfast for 5 days. -     Ambulatory referral to Physical Therapy -     CBC with Differential/Platelet  Tobacco abuse Encounter for smoking cessation counseling Please think about quitting smoking.  This is very important for your health.  There are many things we can do to help you quit.  Let us know if you are interested.  You can also call 1-800-QUIT-NOW (918)105-9787) for free smoking cessation counseling -     nicotine (NICODERM CQ - DOSED IN MG/24 HOURS) 21 mg/24hr patch; Place 1 patch (21 mg total) onto the skin daily.  Follow up in 6 months for HTN, lipids.   The above assessment and management plan was discussed with the patient. The patient verbalized understanding of and has agreed to the management plan. Patient is aware to call the clinic if they develop any new symptoms or if symptoms fail to improve or worsen. Patient is aware when to return to the  clinic for a follow-up visit. Patient educated on when it is appropriate to go to the emergency department.   Marjorie Smolder, RN, FNP student [facility] [address] [place], [place] [address] [phone]    I personally was present during the history, physical exam, and medical decision-making activities of this service and have verified that the service and findings are accurately documented in the nurse practitioner student's note.  Monia Pouch, FNP-C [facility] [address] [place], [place] [address] [phone]

## 2019-09-26 NOTE — Patient Instructions (Signed)
It was a pleasure seeing you today, N[place]an.  Information regarding what we discussed is included in this packet.  Please make an appointment to see me in 6 months.   In a few days you may receive a survey in the mail or online from [employer] regarding your visit with [place] today. Please take a moment to fill this out. Your feedback is very important to our office. It can help [place] better understand your needs as well as improve your experience and satisfaction. Thank you for taking your time to complete it. We care about you.  Because of recent events of COVID-19 ("Coronavirus"), please follow CDC recommendations:   1. Wash your hand frequently 2. Avoid touching your face 3. Stay away from people who are sick 4. If you have symptoms such as fever, cough, shortness of breath then call your healthcare provider for further guidance 5. If you are sick, STAY AT HOME, unless otherwise directed by your healthcare provider. 6. Follow directions from state and national officials regarding staying safe    Please feel free to call our office if any questions or concerns arise.  Warm Regards, Monia Pouch, FNP-C [facility] [address] [place], [place] [address] [phone]

## 2019-09-26 NOTE — Addendum Note (Signed)
Addended by: Baruch Gouty on: 09/26/2019 01:17 PM   Modules accepted: Orders

## 2019-09-27 ENCOUNTER — Telehealth: Payer: Self-pay

## 2019-09-27 LAB — CMP14+EGFR
ALT: 41 IU/L (ref 0–44)
AST: 58 IU/L — ABNORMAL HIGH (ref 0–40)
Albumin/Globulin Ratio: 1.8 (ref 1.2–2.2)
Albumin: 4.4 g/dL (ref 4.0–5.0)
Alkaline Phosphatase: 71 IU/L (ref 39–117)
BUN/Creatinine Ratio: 12 (ref 9–20)
BUN: 13 mg/dL (ref 6–24)
Bilirubin Total: 1 mg/dL (ref 0.0–1.2)
CO2: 25 mmol/L (ref 20–29)
Calcium: 9.3 mg/dL (ref 8.7–10.2)
Chloride: 97 mmol/L (ref 96–106)
Creatinine, Ser: 1.08 mg/dL (ref 0.76–1.27)
GFR calc Af Amer: 94 mL/min/{1.73_m2} (ref >59)
GFR calc non Af Amer: 81 mL/min/{1.73_m2} (ref >59)
Globulin, Total: 2.5 g/dL (ref 1.5–4.5)
Glucose: 97 mg/dL (ref 65–99)
Potassium: 3.1 mmol/L — ABNORMAL LOW (ref 3.5–5.2)
Sodium: 138 mmol/L (ref 134–144)
Total Protein: 6.9 g/dL (ref 6.0–8.5)

## 2019-09-27 LAB — CBC WITH DIFFERENTIAL/PLATELET
Basophils Absolute: 0.1 10*3/uL (ref 0.0–0.2)
Basos: 1 %
EOS (ABSOLUTE): 0.3 10*3/uL (ref 0.0–0.4)
Eos: 4 %
Hematocrit: 51.2 % — ABNORMAL HIGH (ref 37.5–51.0)
Hemoglobin: 18.6 g/dL — ABNORMAL HIGH (ref 13.0–17.7)
Immature Grans (Abs): 0 10*3/uL (ref 0.0–0.1)
Immature Granulocytes: 0 %
Lymphocytes Absolute: 1.5 10*3/uL (ref 0.7–3.1)
Lymphs: 22 %
MCH: 35.4 pg — ABNORMAL HIGH (ref 26.6–33.0)
MCHC: 36.3 g/dL — ABNORMAL HIGH (ref 31.5–35.7)
MCV: 97 fL (ref 79–97)
Monocytes Absolute: 0.8 10*3/uL (ref 0.1–0.9)
Monocytes: 11 %
Neutrophils Absolute: 4.3 10*3/uL (ref 1.4–7.0)
Neutrophils: 62 %
Platelets: 200 10*3/uL (ref 150–450)
RBC: 5.26 x10E6/uL (ref 4.14–5.80)
RDW: 13.2 % (ref 11.6–15.4)
WBC: 7 10*3/uL (ref 3.4–10.8)

## 2019-09-27 LAB — LIPID PANEL
Chol/HDL Ratio: 3 ratio (ref 0.0–5.0)
Cholesterol, Total: 146 mg/dL (ref 100–199)
HDL: 48 mg/dL (ref >39)
LDL Chol Calc (NIH): 39 mg/dL (ref 0–99)
Triglycerides: 409 mg/dL — ABNORMAL HIGH (ref 0–149)
VLDL Cholesterol Cal: 59 mg/dL — ABNORMAL HIGH (ref 5–40)

## 2019-09-27 NOTE — Telephone Encounter (Signed)
Covid-19 screening questions   Do you now or have you had a fever in the last 14 days? NO   Do you have any respiratory symptoms of shortness of breath or cough now or in the last 14 days? NO  Do you have any family members or close contacts with diagnosed or suspected Covid-19 in the past 14 days? NO  Have you been tested for Covid-19 and found to be positive? NO        

## 2019-09-28 ENCOUNTER — Ambulatory Visit (AMBULATORY_SURGERY_CENTER): Payer: Managed Care, Other (non HMO) | Admitting: Gastroenterology

## 2019-09-28 ENCOUNTER — Other Ambulatory Visit: Payer: Self-pay

## 2019-09-28 ENCOUNTER — Encounter: Payer: Self-pay | Admitting: Gastroenterology

## 2019-09-28 VITALS — BP 145/90 | HR 65 | Temp 98.6°F | Resp 22 | Ht 70.0 in | Wt 200.0 lb

## 2019-09-28 DIAGNOSIS — K317 Polyp of stomach and duodenum: Secondary | ICD-10-CM | POA: Diagnosis not present

## 2019-09-28 DIAGNOSIS — Z85038 Personal history of other malignant neoplasm of large intestine: Secondary | ICD-10-CM | POA: Diagnosis not present

## 2019-09-28 DIAGNOSIS — Z1509 Genetic susceptibility to other malignant neoplasm: Secondary | ICD-10-CM | POA: Diagnosis not present

## 2019-09-28 DIAGNOSIS — K2970 Gastritis, unspecified, without bleeding: Secondary | ICD-10-CM | POA: Diagnosis not present

## 2019-09-28 MED ORDER — SODIUM CHLORIDE 0.9 % IV SOLN: 500.0000 mL | Freq: Once | INTRAVENOUS | Status: DC

## 2019-09-28 NOTE — Op Note (Signed)
[facility] Patient Name: Charles Santos Procedure Date: 09/28/2019 1:21 PM MRN: [id number] Endoscopist: [name] , MD Age: 48 Referring MD:  Date of Birth: Oct 14, 1971 Gender: Male Account #: [id number] Procedure:                Flexible Sigmoidoscopy Indications:              Colon cancer screening in patient at increased                            risk: Family history of colon polyps Medicines:                Monitored Anesthesia Care Procedure:                Pre-Anesthesia Assessment:                           - Prior to the procedure, a History and Physical                            was performed, and patient medications and                            allergies were reviewed. The patient's tolerance of                            previous anesthesia was also reviewed. The risks                            and benefits of the procedure and the sedation                            options and risks were discussed with the patient.                            All questions were answered, and informed consent                            was obtained. Prior Anticoagulants: The patient has                            taken no previous anticoagulant or antiplatelet                            agents. ASA Grade Assessment: II - A patient with                            mild systemic disease. After reviewing the risks                            and benefits, the patient was deemed in                            satisfactory condition to undergo the procedure.                           -  Prior to the procedure, a History and Physical                            was performed, and patient medications and                            allergies were reviewed. The patient's tolerance of                            previous anesthesia was also reviewed. The risks                            and benefits of the procedure and the sedation                            options and risks were  discussed with the patient.                            All questions were answered, and informed consent                            was obtained. Prior Anticoagulants: The patient has                            taken no previous anticoagulant or antiplatelet                            agents. ASA Grade Assessment: II - A patient with                            mild systemic disease. After reviewing the risks                            and benefits, the patient was deemed in                            satisfactory condition to undergo the procedure.                           After obtaining informed consent, the scope was                            passed under direct vision. The Colonoscope was                            introduced through the anus and advanced to the the                            ileo-sigmoid anastomosis. The flexible                            sigmoidoscopy was accomplished without difficulty.  The patient tolerated the procedure well. The                            quality of the bowel preparation was good. Scope In: Scope Out: Findings:                 The ileocolonic anastomosis was normal appearing,                            located 25cm from the anus.                           The colonic mucosa that remains was all normal                            appearing. No polyps. Complications:            No immediate complications. Estimated blood loss:                            None. Estimated Blood Loss:     Estimated blood loss: none. Impression:               The ileocolonic anastomosis was normal appearing,                            located 25cm from the anus.                           The colonic mucosa that remains was all normal                            appearing. No polyps. Recommendation:           - Discharge patient to home (ambulatory).                           - Patient has a contact number available for                             emergencies. The signs and symptoms of potential                            delayed complications were discussed with the                            patient. Return to normal activities tomorrow.                            Written discharge instructions were provided to the                            patient.                           - Resume regular diet.                           -  Continue present medications.                           - Repeat Flex sigmoidoscopy in 1 year given your                            [name]. [name], MD 09/28/2019 2:02:45 PM This report has been signed electronically.

## 2019-09-28 NOTE — Op Note (Signed)
[facility] Patient Name: Laif Kohlhoff Procedure Date: 09/28/2019 1:20 PM MRN: [id number] Endoscopist: [name] , MD Age: 48 Referring MD:  Date of Birth: 1971-07-26 Gender: Male Account #: [id number] Procedure:                Upper GI endoscopy Indications:              [name]: Genetically proven [name];                            s/p EGD [facility] 12/2016 Dr. Stephanie Acre for EMR of dudoenal                            adenoma (2.5cm, no HGD); s/p colon cancer at age 68                            and then alsoadenocarcinoma the transverse colon                            2017, stage II (T3 N0), status post subtotal                            colectomy 09/17/2016;                            Flexible sigmoidoscopy 05/2017 [name]; normal IC                            anastomosis at 20cm; repeat at 1 year. Flex sig                            09/2018 no polyps                            EGD 05/2017: minor gastritis, [facility] EMR duodenal                            site looked normal; repeat at 2 years Medicines:                Monitored Anesthesia Care Procedure:                Pre-Anesthesia Assessment:                           - Prior to the procedure, a History and Physical                            was performed, and patient medications and                            allergies were reviewed. The patient's tolerance of                            previous anesthesia was also reviewed. The risks  and benefits of the procedure and the sedation                            options and risks were discussed with the patient.                            All questions were answered, and informed consent                            was obtained. Prior Anticoagulants: The patient has                            taken no previous anticoagulant or antiplatelet                            agents. ASA Grade Assessment: II - A patient with        mild systemic disease. After reviewing the risks                            and benefits, the patient was deemed in                            satisfactory condition to undergo the procedure.                           After obtaining informed consent, the endoscope was                            passed under direct vision. Throughout the                            procedure, the patient's blood pressure, pulse, and                            oxygen saturations were monitored continuously. The                            Endoscope was introduced through the mouth, and                            advanced to the second part of duodenum. The upper                            GI endoscopy was accomplished without difficulty.                            The patient tolerated the procedure well. Scope In: Scope Out: Findings:                 Moderate non-specific pan-gastritis was noted,                            biopsies taken (jar 3).  The site of 2018 duodenal adenoma EMR was noted,                            adjacent tattoo apprecieated. There was a small,                            discrete sessile polyp at the site (1-24mm across)                            which I removed with cold snare (jar 1). The                            surrounding mucosa in the region was                            non-specifically edematous, erythematous without                            discrete borders and it was biopsied with forceps                            (jar 2).                           The exam was otherwise without abnormality. Complications:            No immediate complications. Estimated blood loss:                            None. Estimated Blood Loss:     Estimated blood loss: none. Impression:               - Gastritis, biopsied.                           - The site of 2018 duodenal adenoma EMR was noted.                            There was a small, discrete  sessile polyp at the                            site (1-66mm across) which I removed with cold snare                            (jar 1). The surrounding mucosa in the region was                            non-specifically edematous, erythematous without                            discrete borders and it was biopsied with forceps                            (jar 2). Recommendation:           -  Flex sig now.                           - Await pathology results. [name], MD 09/28/2019 1:59:35 PM This report has been signed electronically.

## 2019-09-28 NOTE — Patient Instructions (Signed)
Handout on hemorrhoids and gastritis given   YOU HAD AN ENDOSCOPIC PROCEDURE TODAY AT [facility]:   Refer to the procedure report that was given to you for any specific questions about what was found during the examination.  If the procedure report does not answer your questions, please call your gastroenterologist to clarify.  If you requested that your care partner not be given the details of your procedure findings, then the procedure report has been included in a sealed envelope for you to review at your convenience later.  YOU SHOULD EXPECT: Some feelings of bloating in the abdomen. Passage of more gas than usual.  Walking can help get rid of the air that was put into your GI tract during the procedure and reduce the bloating. If you had a lower endoscopy (such as a colonoscopy or flexible sigmoidoscopy) you may notice spotting of blood in your stool or on the toilet paper. If you underwent a bowel prep for your procedure, you may not have a normal bowel movement for a few days.  Please Note:  You might notice some irritation and congestion in your nose or some drainage.  This is from the oxygen used during your procedure.  There is no need for concern and it should clear up in a day or so.  SYMPTOMS TO REPORT IMMEDIATELY:   Following lower endoscopy (colonoscopy or flexible sigmoidoscopy):  Excessive amounts of blood in the stool  Significant tenderness or worsening of abdominal pains  Swelling of the abdomen that is new, acute  Fever of 100F or higher   Following upper endoscopy (EGD)  Vomiting of blood or coffee ground material  New chest pain or pain under the shoulder blades  Painful or persistently difficult swallowing  New shortness of breath  Fever of 100F or higher  Black, tarry-looking stools  For urgent or emergent issues, a gastroenterologist can be reached at any hour by calling [phone].   DIET:  We do recommend a small meal at first, but  then you may proceed to your regular diet.  Drink plenty of fluids but you should avoid alcoholic beverages for 24 hours.  ACTIVITY:  You should plan to take it easy for the rest of today and you should NOT DRIVE or use heavy machinery until tomorrow (because of the sedation medicines used during the test).    FOLLOW UP: Our staff will call the number listed on your records 48-72 hours following your procedure to check on you and address any questions or concerns that you may have regarding the information given to you following your procedure. If we do not reach you, we will leave a message.  We will attempt to reach you two times.  During this call, we will ask if you have developed any symptoms of COVID 19. If you develop any symptoms (ie: fever, flu-like symptoms, shortness of breath, cough etc.) before then, please call [phone].  If you test positive for Covid 19 in the 2 weeks post procedure, please call and report this information to [place].    If any biopsies were taken you will be contacted by phone or by letter within the next 1-3 weeks.  Please call us at [phone] if you have not heard about the biopsies in 3 weeks.    SIGNATURES/CONFIDENTIALITY: You and/or your care partner have signed paperwork which will be entered into your electronic medical record.  These signatures attest to the fact that that the information above on your  After Visit Summary has been reviewed and is understood.  Full responsibility of the confidentiality of this discharge information lies with you and/or your care-partner.

## 2019-09-28 NOTE — Progress Notes (Signed)
Report given to PACU, vss 

## 2019-09-28 NOTE — Progress Notes (Signed)
Temp JB V/S CW 

## 2019-10-01 ENCOUNTER — Ambulatory Visit: Payer: Managed Care, Other (non HMO) | Attending: Family Medicine | Admitting: Physical Therapy

## 2019-10-01 ENCOUNTER — Telehealth: Payer: Self-pay | Admitting: Family Medicine

## 2019-10-01 ENCOUNTER — Encounter: Payer: Self-pay | Admitting: Physical Therapy

## 2019-10-01 ENCOUNTER — Other Ambulatory Visit: Payer: Self-pay

## 2019-10-01 DIAGNOSIS — M5441 Lumbago with sciatica, right side: Secondary | ICD-10-CM | POA: Diagnosis not present

## 2019-10-01 NOTE — Therapy (Signed)
[facility] [place], [place], [address] Phone: [phone]   Fax:  [phone]  Physical Therapy Evaluation  Patient Details  Name: Charles Santos MRN: [id number] Date of Birth: 07-15-1971 Referring Provider (PT): Darla Lesches   Encounter Date: 10/01/2019  PT End of Session - 10/01/19 1132    Visit Number  1    Number of Visits  12    Date for PT Re-Evaluation  11/12/19    PT Start Time  1030    PT Stop Time  1117    PT Time Calculation (min)  47 min       Past Medical History:  Diagnosis Date  . Anxiety   . Cancer of transverse colon ([place]) 09/17/2016   surgery done  . Colon cancer [facility]) 1998   surgery and chemo  . Complication of anesthesia    not fully under for polyp removal at [place] 2017  . Depression   . Emphysema of lung ([place])   . Family hx of colon cancer   . GERD (gastroesophageal reflux disease)    none recent  . Headache   . Hyperlipidemia   . Hypertension   . Vitamin D deficiency     Past Surgical History:  Procedure Laterality Date  . APPENDECTOMY    . colon resectomy  1998   2 degree herida  . COLONOSCOPY    . ESOPHAGOGASTRODUODENOSCOPY (EGD) WITH PROPOFOL N/A 05/19/2017   Procedure: ESOPHAGOGASTRODUODENOSCOPY (EGD) WITH PROPOFOL;  Surgeon: [name], MD;  [facility];  Service: Endoscopy;  Laterality: N/A;  . FLEXIBLE SIGMOIDOSCOPY N/A 05/19/2017   Procedure: FLEXIBLE SIGMOIDOSCOPY;  Surgeon: [name], MD;  [facility];  Service: Endoscopy;  Laterality: N/A;  . INGUINAL HERNIA REPAIR    . LAPAROSCOPIC SUBTOTAL COLECTOMY N/A 09/17/2016   Procedure: DIAGNOSTIC LAPAROSCOPY EXPLORATORY LAPAROTOMY SUBTOTAL COLECTOMY PROCTOSCOPY;  Surgeon: [name], MD;  [facility];  Service: General;  Laterality: N/A;  . polyp removal at [place]  2017  . PROCTOSCOPY  09/17/2016   Procedure: PROCTOSCOPY;  Surgeon: [name], MD;  [facility];   Service: General;;  . UPPER GI ENDOSCOPY      There were no vitals filed for this visit.   Subjective Assessment - 10/01/19 1247    Pertinent History  H/o colon cancer, Inguinal hernia surgery,         [facility] PT Assessment - 10/01/19 0001      Assessment   Medical Diagnosis  Chronic right-sided LBP with right sided sciatica.    Referring Provider (PT)  Darla Lesches    Onset Date/Surgical Date  --   Several months.     Precautions   Precautions  None      Restrictions   Weight Bearing Restrictions  No      Balance Screen   Has the patient fallen in the past 6 months  No    Has the patient had a decrease in activity level because of a fear of falling?   No    Is the patient reluctant to leave their home because of a fear of falling?   No      Home Film/video editor residence      Prior Function   Level of Independence  Independent      Posture/Postural Control   Posture/Postural Control  No significant limitations      Deep Tendon Reflexes   DTR Assessment Site  Patella;Achilles    Patella DTR  1+    Achilles DTR  1+      ROM / Strength   AROM / PROM / Strength  AROM;Strength      AROM   Overall AROM Comments  Full active lumbar extension and flexion limited by 25%.      Strength   Overall Strength Comments  Normal LE strength.      Palpation   Palpation comment  Tender to palpation over right SIJ.      Special Tests   Other special tests  C/o pain with right SLR and Slump test.  Equal leg lengths.  (-) scaral press test.      Ambulation/Gait   Gait Comments  WNL.                Objective measurements completed on examination: See above findings.      [facility] Adult PT Treatment/Exercise - 10/01/19 0001      Modalities   Modalities  Electrical Stimulation;Moist Heat      Moist Heat Therapy   Number Minutes Moist Heat  20 Minutes    Moist Heat Location  Lumbar Spine      Electrical Stimulation   Electrical  Stimulation Location  LB    Electrical Stimulation Action  IFC    Electrical Stimulation Parameters  80-150 Hz x 20 minutes.    Electrical Stimulation Goals  Tone;Pain                  PT Long Term Goals - 10/01/19 1249      PT LONG TERM GOAL #1   Title  Independent with a HEP.    Time  6    Period  Weeks    Status  New      PT LONG TERM GOAL #2   Title  Perform ADL's with pain not > 2-3/10.    Time  6    Period  Weeks    Status  New      PT LONG TERM GOAL #3   Title  Eliminate right LE symptoms.    Time  6    Period  Weeks    Status  New             Plan - 10/01/19 1245    Clinical Impression Statement  The patient presents to OPPT with a CC of right sided low back pain with variable levels of pain depending on what he does.  He was found to be very tender to palpation over his right SIJ today.  He c/o pain with a SLR and Slump test.  He will experience pain into his right posterior thigh and calf.  Patient will benefit from skilled PT intervention to address deficits and pain.    Personal Factors and Comorbidities  Comorbidity 1;Comorbidity 2    Comorbidities  H/o colon cancer, Inguinal hernia surgery,    Examination-Activity Limitations  Locomotion Level;Other    Examination-Participation Restrictions  Other    Stability/Clinical Decision Making  Evolving/Moderate complexity    Clinical Decision Making  Low    Rehab Potential  Good    PT Frequency  2x / week    PT Duration  6 weeks    PT Treatment/Interventions  ADLs/Self Care Home Management;Cryotherapy;Electrical Stimulation;Ultrasound;Moist Heat;Therapeutic activities;Therapeutic exercise;Manual techniques;Patient/family education;Passive range of motion    PT Next Visit Plan  S and DKTC, hip bridges, modalites and STW/M to right SIJ region.    Consulted and Agree with  Plan of Care  Patient       Patient will benefit from skilled therapeutic intervention in order to improve the following deficits  and impairments:  Pain, Decreased activity tolerance, Decreased range of motion  Visit Diagnosis: Acute right-sided low back pain with right-sided sciatica - Plan: PT plan of care cert/re-cert     Problem List Patient Active Problem List   Diagnosis Date Noted  . Essential hypertension 09/26/2019  . Chronic right-sided low back pain with right-sided sciatica 09/26/2019  . Recurrent right inguinal hernia 06/26/2019  . Hypertriglyceridemia 11/24/2018  . Anxiety 02/16/2018  . Elevated CEA   . [name]   . Cancer of transverse colon ([place]) 09/17/2016  . S/P colonoscopic polypectomy   . Hematochezia   . Depression 06/01/2016  . Alcohol abuse, in remission 06/01/2016  . Tobacco abuse 06/01/2016  . Vitamin D deficiency 10/17/2015  . Hyperlipidemia 10/17/2015  . History of malignant neoplasm of colon without staging 09/22/2010    Quetzalli Clos, [place]  MPT 10/01/2019, 12:52 PM  [facility] [address] [place], [place], [address] Phone: [phone]   Fax:  [phone]  Name: Charles Santos MRN: [id number] Date of Birth: 1971-12-11

## 2019-10-01 NOTE — Telephone Encounter (Signed)
If PT is not beneficial, we can refer to ortho. They can order a MRI if deemed necessary.

## 2019-10-01 NOTE — Telephone Encounter (Signed)
Advised patient to continue with therapy and he agreed. Wanted you to also know that he has only smoked 2 cigarettes today and is not drinking any alcohol. He says that he feels great and he has got some of his energy back. FYI

## 2019-10-02 ENCOUNTER — Telehealth: Payer: Self-pay

## 2019-10-02 NOTE — Telephone Encounter (Signed)
  Follow up Call-  Call back number 09/28/2019 09/29/2018  Post procedure Call Back phone  # [phone]  Permission to leave phone message Yes Yes  Some recent data might be hidden     Patient questions:  Do you have a fever, pain , or abdominal swelling? No. Pain Score  0 *  Have you tolerated food without any problems? Yes.    Have you been able to return to your normal activities? Yes.    Do you have any questions about your discharge instructions: Diet   No. Medications  No. Follow up visit  No.  Do you have questions or concerns about your Care? No.  Actions: * If pain score is 4 or above: No action needed, pain <4.  1. Have you developed a fever since your procedure? no  2.   Have you had an respiratory symptoms (SOB or cough) since your procedure? no  3.   Have you tested positive for COVID 19 since your procedure no  4.   Have you had any family members/close contacts diagnosed with the COVID 19 since your procedure?  no   If yes to any of these questions please route to Joylene John, RN and Alphonsa Gin, Therapist, sports.

## 2019-10-02 NOTE — Telephone Encounter (Signed)
First attempt follow up call to pt, lm on vm 

## 2019-10-10 ENCOUNTER — Ambulatory Visit: Payer: Managed Care, Other (non HMO) | Admitting: Physical Therapy

## 2019-10-10 ENCOUNTER — Telehealth: Payer: Self-pay | Admitting: Family Medicine

## 2019-10-10 ENCOUNTER — Other Ambulatory Visit: Payer: Self-pay

## 2019-10-10 DIAGNOSIS — M5441 Lumbago with sciatica, right side: Secondary | ICD-10-CM

## 2019-10-10 NOTE — Telephone Encounter (Signed)
Pain is getting worse with back and legs. He just got done with his second session of therapy. What is the next step? Please advise.

## 2019-10-10 NOTE — Telephone Encounter (Signed)
Patient aware and verbalized understanding. Can he do another medication for the pain?

## 2019-10-10 NOTE — Telephone Encounter (Signed)
We need to continue with therapy for at least 6 weeks to see if beneficial. If not, we can refer to ortho.

## 2019-10-10 NOTE — Therapy (Signed)
[facility] [place], [place], [address] Phone: [phone]   Fax:  [phone]  Physical Therapy Treatment  Patient Details  Name: Charles Santos MRN: [id number] Date of Birth: [birth year] Referring Provider (PT): Darla Lesches   Encounter Date: 10/10/2019  PT End of Session - 10/10/19 1234    Visit Number  2    Number of Visits  12    Date for PT Re-Evaluation  11/12/19    PT Start Time  1118    PT Stop Time  1211    PT Time Calculation (min)  53 min       Past Medical History:  Diagnosis Date  . Anxiety   . Cancer of transverse colon ([place]) 09/17/2016   surgery done  . Colon cancer [facility]) 1998   surgery and chemo  . Complication of anesthesia    not fully under for polyp removal at [place] 2017  . Depression   . Emphysema of lung ([place])   . Family hx of colon cancer   . GERD (gastroesophageal reflux disease)    none recent  . Headache   . Hyperlipidemia   . Hypertension   . Vitamin D deficiency     Past Surgical History:  Procedure Laterality Date  . APPENDECTOMY    . colon resectomy  1998   2 degree herida  . COLONOSCOPY    . ESOPHAGOGASTRODUODENOSCOPY (EGD) WITH PROPOFOL N/A 05/19/2017   Procedure: ESOPHAGOGASTRODUODENOSCOPY (EGD) WITH PROPOFOL;  Surgeon: [name], MD;  [facility];  Service: Endoscopy;  Laterality: N/A;  . FLEXIBLE SIGMOIDOSCOPY N/A 05/19/2017   Procedure: FLEXIBLE SIGMOIDOSCOPY;  Surgeon: [name], MD;  [facility];  Service: Endoscopy;  Laterality: N/A;  . INGUINAL HERNIA REPAIR    . LAPAROSCOPIC SUBTOTAL COLECTOMY N/A 09/17/2016   Procedure: DIAGNOSTIC LAPAROSCOPY EXPLORATORY LAPAROTOMY SUBTOTAL COLECTOMY PROCTOSCOPY;  Surgeon: [name], MD;  [facility];  Service: General;  Laterality: N/A;  . polyp removal at [place]  2017  . PROCTOSCOPY  09/17/2016   Procedure: PROCTOSCOPY;  Surgeon: [name], MD;  [facility];  Service:  General;;  . UPPER GI ENDOSCOPY      There were no vitals filed for this visit.  Subjective Assessment - 10/10/19 1227    Subjective  COVID-19 screen performed prior to patient entering clinic.  Still getting pain down right leg.    Pertinent History  H/o colon cancer, Inguinal hernia surgery,    How long can you walk comfortably?  Community distance.    Diagnostic tests  X-ray.    Patient Stated Goals  Get X-ray.    Currently in Pain?  Yes    Pain Score  4     Pain Location  Back    Pain Orientation  Right    Pain Descriptors / Indicators  Aching    Pain Type  Acute pain    Pain Onset  More than a month ago                       [facility] Adult PT Treatment/Exercise - 10/10/19 0001      Modalities   Modalities  Electrical Stimulation;Moist Heat;Ultrasound      Moist Heat Therapy   Number Minutes Moist Heat  20 Minutes    Moist Heat Location  Lumbar Spine      Electrical Stimulation   Electrical Stimulation Location  RT LB    Electrical Stimulation Action  Pre-mod.    Electrical Stimulation Parameters  80-150 Hz x 20 minutes.    Electrical Stimulation Goals  Tone;Pain      Ultrasound   Ultrasound Location  Right LB/SIJ    Ultrasound Parameters  Combo e'stim/U/S at 1.50 W/CM2 x 12 minutes (patient in left sdly position with folded pillows between knees for comfort).      Manual Therapy   Manual Therapy  Soft tissue mobilization    Soft tissue mobilization  STW/M x 11 minutes to patient's left LB/SIJ region.                  PT Long Term Goals - 10/01/19 1249      PT LONG TERM GOAL #1   Title  Independent with a HEP.    Time  6    Period  Weeks    Status  New      PT LONG TERM GOAL #2   Title  Perform ADL's with pain not > 2-3/10.    Time  6    Period  Weeks    Status  New      PT LONG TERM GOAL #3   Title  Eliminate right LE symptoms.    Time  6    Period  Weeks    Status  New            Plan - 10/10/19 1234    Clinical  Impression Statement  Patient did well today.  He reported continued right low back/SIJ and right LE pain.    Personal Factors and Comorbidities  Comorbidity 1;Comorbidity 2    Comorbidities  H/o colon cancer, Inguinal hernia surgery,    Examination-Activity Limitations  Locomotion Level;Other    Examination-Participation Restrictions  Other    Stability/Clinical Decision Making  Evolving/Moderate complexity    Rehab Potential  Good    PT Frequency  2x / week    PT Duration  6 weeks    PT Treatment/Interventions  ADLs/Self Care Home Management;Cryotherapy;Electrical Stimulation;Ultrasound;Moist Heat;Therapeutic activities;Therapeutic exercise;Manual techniques;Patient/family education;Passive range of motion    PT Next Visit Plan  S and DKTC, hip bridges, modalites and STW/M to right SIJ region.    Consulted and Agree with Plan of Care  Patient       Patient will benefit from skilled therapeutic intervention in order to improve the following deficits and impairments:  Pain, Decreased activity tolerance, Decreased range of motion  Visit Diagnosis: Acute right-sided low back pain with right-sided sciatica     Problem List Patient Active Problem List   Diagnosis Date Noted  . Essential hypertension 09/26/2019  . Chronic right-sided low back pain with right-sided sciatica 09/26/2019  . Recurrent right inguinal hernia 06/26/2019  . Hypertriglyceridemia 11/24/2018  . Anxiety 02/16/2018  . Elevated CEA   . [name]   . Cancer of transverse colon ([place]) 09/17/2016  . S/P colonoscopic polypectomy   . Hematochezia   . Depression 06/01/2016  . Alcohol abuse, in remission 06/01/2016  . Tobacco abuse 06/01/2016  . Vitamin D deficiency 10/17/2015  . Hyperlipidemia 10/17/2015  . History of malignant neoplasm of colon without staging 09/22/2010    Takumi Din, [place] MPT 10/10/2019, 12:36 PM  [facility] [address] [place], [place],  [address] Phone: [phone]   Fax:  [phone]  Name: Charles Santos MRN: [id number] Date of Birth: [birth year]

## 2019-10-11 ENCOUNTER — Other Ambulatory Visit: Payer: Self-pay | Admitting: Family Medicine

## 2019-10-11 DIAGNOSIS — G8929 Other chronic pain: Secondary | ICD-10-CM

## 2019-10-11 DIAGNOSIS — M5441 Lumbago with sciatica, right side: Secondary | ICD-10-CM

## 2019-10-11 MED ORDER — MELOXICAM 15 MG PO TABS: 15.0000 mg | ORAL_TABLET | Freq: Every day | ORAL | 3 refills | Status: DC

## 2019-10-11 NOTE — Telephone Encounter (Signed)
Patient aware and verbalizes understanding. 

## 2019-10-11 NOTE — Telephone Encounter (Signed)
We can switch to once daily Meloxicam if he would like.

## 2019-10-11 NOTE — Telephone Encounter (Signed)
RX sent. Stop naproxen.

## 2019-10-11 NOTE — Telephone Encounter (Signed)
Patient states he will try it.  Please send in rx to [facility].

## 2019-10-19 ENCOUNTER — Ambulatory Visit: Payer: Managed Care, Other (non HMO) | Attending: Family Medicine | Admitting: Physical Therapy

## 2019-10-19 ENCOUNTER — Other Ambulatory Visit: Payer: Self-pay

## 2019-10-19 DIAGNOSIS — M5441 Lumbago with sciatica, right side: Secondary | ICD-10-CM | POA: Insufficient documentation

## 2019-10-19 NOTE — Therapy (Signed)
[facility] [place], [place], [address] Phone: [phone]   Fax:  [phone]  Physical Therapy Treatment  Patient Details  Name: Charles Santos MRN: [id number] Date of Birth: Oct 17, 1971 Referring Provider (PT): Darla Lesches   Encounter Date: 10/19/2019  PT End of Session - 10/19/19 1159    Visit Number  3    Number of Visits  12    Date for PT Re-Evaluation  11/12/19    PT Start Time  1057    PT Stop Time  1148    PT Time Calculation (min)  51 min       Past Medical History:  Diagnosis Date  . Anxiety   . Cancer of transverse colon ([place]) 09/17/2016   surgery done  . Colon cancer [facility]) 1998   surgery and chemo  . Complication of anesthesia    not fully under for polyp removal at [place] 2017  . Depression   . Emphysema of lung ([place])   . Family hx of colon cancer   . GERD (gastroesophageal reflux disease)    none recent  . Headache   . Hyperlipidemia   . Hypertension   . Vitamin D deficiency     Past Surgical History:  Procedure Laterality Date  . APPENDECTOMY    . colon resectomy  1998   2 degree herida  . COLONOSCOPY    . ESOPHAGOGASTRODUODENOSCOPY (EGD) WITH PROPOFOL N/A 05/19/2017   Procedure: ESOPHAGOGASTRODUODENOSCOPY (EGD) WITH PROPOFOL;  Surgeon: [name], MD;  [facility];  Service: Endoscopy;  Laterality: N/A;  . FLEXIBLE SIGMOIDOSCOPY N/A 05/19/2017   Procedure: FLEXIBLE SIGMOIDOSCOPY;  Surgeon: [name], MD;  [facility];  Service: Endoscopy;  Laterality: N/A;  . INGUINAL HERNIA REPAIR    . LAPAROSCOPIC SUBTOTAL COLECTOMY N/A 09/17/2016   Procedure: DIAGNOSTIC LAPAROSCOPY EXPLORATORY LAPAROTOMY SUBTOTAL COLECTOMY PROCTOSCOPY;  Surgeon: [name], MD;  [facility];  Service: General;  Laterality: N/A;  . polyp removal at [place]  2017  . PROCTOSCOPY  09/17/2016   Procedure: PROCTOSCOPY;  Surgeon: [name], MD;  [facility];  Service:  General;;  . UPPER GI ENDOSCOPY      There were no vitals filed for this visit.  Subjective Assessment - 10/19/19 1155    Subjective  COVID-19 screen performed prior to patient entering clinic.  Right leg has really been hurting me.    Pertinent History  H/o colon cancer, Inguinal hernia surgery,    How long can you walk comfortably?  Community distance.    Diagnostic tests  X-ray.    Patient Stated Goals  Get X-ray.    Currently in Pain?  Yes    Pain Score  6     Pain Location  Leg    Pain Orientation  Right    Pain Descriptors / Indicators  Aching;Throbbing    Pain Type  Acute pain    Pain Onset  More than a month ago                       [facility] Adult PT Treatment/Exercise - 10/19/19 0001      Exercises   Exercises  Knee/Hip      Knee/Hip Exercises: Aerobic   Nustep  Level 3 x 10 minutes.      Knee/Hip Exercises: Supine   Other Supine Knee/Hip Exercises  SKTC and supine HSS with long hold stretching.  Patient attempted a hip bridge exercise but  could not perform due to pain.      Modalities   Modalities  Electrical Stimulation;Moist Heat;Ultrasound      Moist Heat Therapy   Number Minutes Moist Heat  20 Minutes    Moist Heat Location  Lumbar Spine      Electrical Stimulation   Electrical Stimulation Location  RT LB    Electrical Stimulation Action  Pre-mod.    Electrical Stimulation Parameters  80-150 Hz x 20 minutes.    Electrical Stimulation Goals  Tone;Pain      Ultrasound   Ultrasound Location  RT LB/SIJ    Ultrasound Parameters  Combo e'stim/U/S at 1.50 W/CM2 x 10 minutes.    Ultrasound Goals  Pain                  PT Long Term Goals - 10/01/19 1249      PT LONG TERM GOAL #1   Title  Independent with a HEP.    Time  6    Period  Weeks    Status  New      PT LONG TERM GOAL #2   Title  Perform ADL's with pain not > 2-3/10.    Time  6    Period  Weeks    Status  New      PT LONG TERM GOAL #3   Title  Eliminate right LE  symptoms.    Time  6    Period  Weeks    Status  New            Plan - 10/19/19 1202    Clinical Impression Statement  Patient continues to have a great deal of pain into his right low back.  Patient attempting a hip bridge exercise but could not due to pain.    Personal Factors and Comorbidities  Comorbidity 1;Comorbidity 2    Comorbidities  H/o colon cancer, Inguinal hernia surgery,    Examination-Participation Restrictions  Other    Rehab Potential  Good    PT Frequency  2x / week    PT Duration  6 weeks    PT Treatment/Interventions  ADLs/Self Care Home Management;Cryotherapy;Electrical Stimulation;Ultrasound;Moist Heat;Therapeutic activities;Therapeutic exercise;Manual techniques;Patient/family education;Passive range of motion    PT Next Visit Plan  S and DKTC, hip bridges, modalites and STW/M to right SIJ region.    Consulted and Agree with Plan of Care  Patient       Patient will benefit from skilled therapeutic intervention in order to improve the following deficits and impairments:  Pain, Decreased activity tolerance, Decreased range of motion  Visit Diagnosis: Acute right-sided low back pain with right-sided sciatica     Problem List Patient Active Problem List   Diagnosis Date Noted  . Essential hypertension 09/26/2019  . Chronic right-sided low back pain with right-sided sciatica 09/26/2019  . Recurrent right inguinal hernia 06/26/2019  . Hypertriglyceridemia 11/24/2018  . Anxiety 02/16/2018  . Elevated CEA   . [name]   . Cancer of transverse colon ([place]) 09/17/2016  . S/P colonoscopic polypectomy   . Hematochezia   . Depression 06/01/2016  . Alcohol abuse, in remission 06/01/2016  . Tobacco abuse 06/01/2016  . Vitamin D deficiency 10/17/2015  . Hyperlipidemia 10/17/2015  . History of malignant neoplasm of colon without staging 09/22/2010    Jayleah Garbers, [place] MPT 10/19/2019, 12:04 PM  [facility] [address] [place], [place], [address] Phone: [phone]   Fax:  [phone]  Name: Charles Santos MRN: [id number]  Date of Birth: [birth year]

## 2019-10-24 ENCOUNTER — Encounter: Payer: Self-pay | Admitting: Physical Therapy

## 2019-10-24 ENCOUNTER — Ambulatory Visit: Payer: Managed Care, Other (non HMO) | Admitting: Physical Therapy

## 2019-10-24 ENCOUNTER — Other Ambulatory Visit: Payer: Self-pay | Admitting: Family Medicine

## 2019-10-24 ENCOUNTER — Other Ambulatory Visit: Payer: Self-pay

## 2019-10-24 DIAGNOSIS — M5441 Lumbago with sciatica, right side: Secondary | ICD-10-CM | POA: Diagnosis not present

## 2019-10-24 DIAGNOSIS — E781 Pure hyperglyceridemia: Secondary | ICD-10-CM

## 2019-10-24 NOTE — Therapy (Signed)
[facility] [place], [place], [address] Phone: [phone]   Fax:  [phone]  Physical Therapy Treatment  Patient Details  Name: Charles Santos MRN: [id number] Date of Birth: Jun 19, 1971 Referring Provider (PT): Darla Lesches   Encounter Date: 10/24/2019  PT End of Session - 10/24/19 1432    Visit Number  4    Number of Visits  12    Date for PT Re-Evaluation  11/12/19    PT Start Time  1115    PT Stop Time  1210    PT Time Calculation (min)  55 min    Activity Tolerance  Patient tolerated treatment well    Behavior During Therapy  [facility] for tasks assessed/performed       Past Medical History:  Diagnosis Date  . Anxiety   . Cancer of transverse colon ([place]) 09/17/2016   surgery done  . Colon cancer [facility]) 1998   surgery and chemo  . Complication of anesthesia    not fully under for polyp removal at [place] 2017  . Depression   . Emphysema of lung ([place])   . Family hx of colon cancer   . GERD (gastroesophageal reflux disease)    none recent  . Headache   . Hyperlipidemia   . Hypertension   . Vitamin D deficiency     Past Surgical History:  Procedure Laterality Date  . APPENDECTOMY    . colon resectomy  1998   2 degree herida  . COLONOSCOPY    . ESOPHAGOGASTRODUODENOSCOPY (EGD) WITH PROPOFOL N/A 05/19/2017   Procedure: ESOPHAGOGASTRODUODENOSCOPY (EGD) WITH PROPOFOL;  Surgeon: [name], MD;  [facility]: [facility];  Service: Endoscopy;  Laterality: N/A;  . FLEXIBLE SIGMOIDOSCOPY N/A 05/19/2017   Procedure: FLEXIBLE SIGMOIDOSCOPY;  Surgeon: [name], MD;  [facility];  Service: Endoscopy;  Laterality: N/A;  . INGUINAL HERNIA REPAIR    . LAPAROSCOPIC SUBTOTAL COLECTOMY N/A 09/17/2016   Procedure: DIAGNOSTIC LAPAROSCOPY EXPLORATORY LAPAROTOMY SUBTOTAL COLECTOMY PROCTOSCOPY;  Surgeon: [name], MD;  [facility]: [facility];  Service: General;  Laterality: N/A;  . polyp removal at [place]   2017  . PROCTOSCOPY  09/17/2016   Procedure: PROCTOSCOPY;  Surgeon: [name], MD;  [facility]: [facility];  Service: General;;  . UPPER GI ENDOSCOPY      There were no vitals filed for this visit.  Subjective Assessment - 10/24/19 1253    Subjective  COVID-19 screen performed prior to patient entering clinic.  My back feels good today.    Pertinent History  H/o colon cancer, Inguinal hernia surgery,    How long can you walk comfortably?  Community distance.    Diagnostic tests  X-ray.    Patient Stated Goals  Get X-ray.    Currently in Pain?  Yes    Pain Score  2                        [facility] Adult PT Treatment/Exercise - 10/24/19 0001      Exercises   Exercises  Knee/Hip      Knee/Hip Exercises: Aerobic   Nustep  Level 3 x 15 minutes.      Knee/Hip Exercises: Supine   Other Supine Knee/Hip Exercises  Total hip bridges 40 reps.      Modalities   Modalities  Electrical Stimulation;Moist Heat;Ultrasound      Moist Heat Therapy   Number Minutes Moist Heat  20 Minutes    Moist  Heat Location  Lumbar Spine      Electrical Stimulation   Electrical Stimulation Location  LB    Electrical Stimulation Action  Pre-mod.    Electrical Stimulation Parameters  80-150 Hz x 20 minutes.    Electrical Stimulation Goals  Tone;Pain      Ultrasound   Ultrasound Location  Affected low back    Ultrasound Parameters  Combo e'stim/U/S at 1.50 W/CM2 x 8 minutes.    Ultrasound Goals  Pain                  PT Long Term Goals - 10/01/19 1249      PT LONG TERM GOAL #1   Title  Independent with a HEP.    Time  6    Period  Weeks    Status  New      PT LONG TERM GOAL #2   Title  Perform ADL's with pain not > 2-3/10.    Time  6    Period  Weeks    Status  New      PT LONG TERM GOAL #3   Title  Eliminate right LE symptoms.    Time  6    Period  Weeks    Status  New            Plan - 10/24/19 1433    Clinical Impression Statement  Patient having a  better day with a lower pain-level today even though he hit his right side on an object at work yesterday.  He increased his time on the Nustep today and was able to perform a total of 40 hip bridges today.    Personal Factors and Comorbidities  Comorbidity 1;Comorbidity 2    Comorbidities  H/o colon cancer, Inguinal hernia surgery,    Examination-Activity Limitations  Locomotion Level;Other    Examination-Participation Restrictions  Other    Stability/Clinical Decision Making  Evolving/Moderate complexity    Rehab Potential  Good    PT Frequency  2x / week    PT Duration  6 weeks    PT Treatment/Interventions  ADLs/Self Care Home Management;Cryotherapy;Electrical Stimulation;Ultrasound;Moist Heat;Therapeutic activities;Therapeutic exercise;Manual techniques;Patient/family education;Passive range of motion    PT Next Visit Plan  S and DKTC, hip bridges, modalites and STW/M to right SIJ region.    Consulted and Agree with Plan of Care  Patient       Patient will benefit from skilled therapeutic intervention in order to improve the following deficits and impairments:  Pain, Decreased activity tolerance, Decreased range of motion  Visit Diagnosis: Acute right-sided low back pain with right-sided sciatica     Problem List Patient Active Problem List   Diagnosis Date Noted  . Essential hypertension 09/26/2019  . Chronic right-sided low back pain with right-sided sciatica 09/26/2019  . Recurrent right inguinal hernia 06/26/2019  . Hypertriglyceridemia 11/24/2018  . Anxiety 02/16/2018  . Elevated CEA   . [name]   . Cancer of transverse colon ([place]) 09/17/2016  . S/P colonoscopic polypectomy   . Hematochezia   . Depression 06/01/2016  . Alcohol abuse, in remission 06/01/2016  . Tobacco abuse 06/01/2016  . Vitamin D deficiency 10/17/2015  . Hyperlipidemia 10/17/2015  . History of malignant neoplasm of colon without staging 09/22/2010    [name], [place] MPT 10/24/2019, 2:36  PM  [facility] [address] [place], [place], [address] Phone: [phone]   Fax:  [phone]  Name: Charles Santos MRN: [id number] Date of Birth: [birth year]

## 2019-10-29 ENCOUNTER — Ambulatory Visit: Payer: Managed Care, Other (non HMO) | Admitting: Physical Therapy

## 2019-10-29 ENCOUNTER — Encounter: Payer: Self-pay | Admitting: Physical Therapy

## 2019-10-29 ENCOUNTER — Other Ambulatory Visit: Payer: Self-pay

## 2019-10-29 DIAGNOSIS — M5441 Lumbago with sciatica, right side: Secondary | ICD-10-CM | POA: Diagnosis not present

## 2019-10-29 NOTE — Therapy (Signed)
[facility] [place], [place], [address] Phone: [phone]   Fax:  [phone]  Physical Therapy Treatment  Patient Details  Name: Charles Santos MRN: [id number] Date of Birth: 08-11-1971 Referring Provider (PT): Darla Lesches   Encounter Date: 10/29/2019  PT End of Session - 10/29/19 1230    Visit Number  5    Number of Visits  12    Date for PT Re-Evaluation  11/12/19    PT Start Time  1100    PT Stop Time  1153    PT Time Calculation (min)  53 min    Activity Tolerance  Patient tolerated treatment well    Behavior During Therapy  [facility] for tasks assessed/performed       Past Medical History:  Diagnosis Date  . Anxiety   . Cancer of transverse colon ([place]) 09/17/2016   surgery done  . Colon cancer [facility]) 1998   surgery and chemo  . Complication of anesthesia    not fully under for polyp removal at [place] 2017  . Depression   . Emphysema of lung ([place])   . Family hx of colon cancer   . GERD (gastroesophageal reflux disease)    none recent  . Headache   . Hyperlipidemia   . Hypertension   . Vitamin D deficiency     Past Surgical History:  Procedure Laterality Date  . APPENDECTOMY    . colon resectomy  1998   2 degree herida  . COLONOSCOPY    . ESOPHAGOGASTRODUODENOSCOPY (EGD) WITH PROPOFOL N/A 05/19/2017   Procedure: ESOPHAGOGASTRODUODENOSCOPY (EGD) WITH PROPOFOL;  Surgeon: [name], MD;  [facility];  Service: Endoscopy;  Laterality: N/A;  . FLEXIBLE SIGMOIDOSCOPY N/A 05/19/2017   Procedure: FLEXIBLE SIGMOIDOSCOPY;  Surgeon: [name], MD;  [facility];  Service: Endoscopy;  Laterality: N/A;  . INGUINAL HERNIA REPAIR    . LAPAROSCOPIC SUBTOTAL COLECTOMY N/A 09/17/2016   Procedure: DIAGNOSTIC LAPAROSCOPY EXPLORATORY LAPAROTOMY SUBTOTAL COLECTOMY PROCTOSCOPY;  Surgeon: [name], MD;  [facility];  Service: General;  Laterality: N/A;  . polyp removal at [place]   2017  . PROCTOSCOPY  09/17/2016   Procedure: PROCTOSCOPY;  Surgeon: [name], MD;  [facility];  Service: General;;  . UPPER GI ENDOSCOPY      There were no vitals filed for this visit.  Subjective Assessment - 10/29/19 1231    Subjective  COVID-19 screen performed prior to patient entering clinic.  Back hurt a lot yesteday.  Not too bad today.  Pain still going down right leg.    Pertinent History  H/o colon cancer, Inguinal hernia surgery,    How long can you walk comfortably?  Community distance.    Diagnostic tests  X-ray.    Patient Stated Goals  Get X-ray.    Currently in Pain?  Yes    Pain Score  3     Pain Location  Leg    Pain Orientation  Right    Pain Descriptors / Indicators  Aching;Throbbing    Pain Type  Acute pain    Pain Onset  More than a month ago                       [facility] Adult PT Treatment/Exercise - 10/29/19 0001      Exercises   Exercises  Knee/Hip      Knee/Hip Exercises: Aerobic   Nustep  Level 3 x 17 minutes.  Knee/Hip Exercises: Sidelying   Other Sidelying Knee/Hip Exercises  Left sidelying positional traction x 5 minutes while receiving STW/M to the patient's right QL(5 minutes).      Modalities   Modalities  Electrical Stimulation;Moist Heat      Moist Heat Therapy   Number Minutes Moist Heat  20 Minutes    Moist Heat Location  Lumbar Spine      Electrical Stimulation   Electrical Stimulation Location  RT LB    Electrical Stimulation Action  Pre-mod.    Electrical Stimulation Parameters  80-150 Hz x 20 minutes.    Electrical Stimulation Goals  Tone;Pain                  PT Long Term Goals - 10/01/19 1249      PT LONG TERM GOAL #1   Title  Independent with a HEP.    Time  6    Period  Weeks    Status  New      PT LONG TERM GOAL #2   Title  Perform ADL's with pain not > 2-3/10.    Time  6    Period  Weeks    Status  New      PT LONG TERM GOAL #3   Title  Eliminate right LE symptoms.     Time  6    Period  Weeks    Status  New            Plan - 10/29/19 1238    Clinical Impression Statement  Patient did well with positional traction today.  He was found to be very tender to palpation over his right QL today.  Patient continues to experience pain into his right LE.    Personal Factors and Comorbidities  Comorbidity 1;Comorbidity 2    Comorbidities  H/o colon cancer, Inguinal hernia surgery,    Examination-Activity Limitations  Locomotion Level;Other    Examination-Participation Restrictions  Other    Stability/Clinical Decision Making  Evolving/Moderate complexity    Rehab Potential  Good    PT Duration  6 weeks    PT Treatment/Interventions  ADLs/Self Care Home Management;Cryotherapy;Electrical Stimulation;Ultrasound;Moist Heat;Therapeutic activities;Therapeutic exercise;Manual techniques;Patient/family education;Passive range of motion    PT Next Visit Plan  S and DKTC, hip bridges, modalites and STW/M to right SIJ region.    Consulted and Agree with Plan of Care  Patient       Patient will benefit from skilled therapeutic intervention in order to improve the following deficits and impairments:  Pain, Decreased activity tolerance, Decreased range of motion  Visit Diagnosis: Acute right-sided low back pain with right-sided sciatica     Problem List Patient Active Problem List   Diagnosis Date Noted  . Essential hypertension 09/26/2019  . Chronic right-sided low back pain with right-sided sciatica 09/26/2019  . Recurrent right inguinal hernia 06/26/2019  . Hypertriglyceridemia 11/24/2018  . Anxiety 02/16/2018  . Elevated CEA   . [name]   . Cancer of transverse colon ([place]) 09/17/2016  . S/P colonoscopic polypectomy   . Hematochezia   . Depression 06/01/2016  . Alcohol abuse, in remission 06/01/2016  . Tobacco abuse 06/01/2016  . Vitamin D deficiency 10/17/2015  . Hyperlipidemia 10/17/2015  . History of malignant neoplasm of colon without  staging 09/22/2010    Dolph Tavano, [place] MPT 10/29/2019, 12:41 PM  [facility] [address] [place], [place], [address] Phone: [phone]   Fax:  [phone]  Name: Charles Santos MRN: [id number] Date  of Birth: [birth year]

## 2019-10-30 ENCOUNTER — Ambulatory Visit (INDEPENDENT_AMBULATORY_CARE_PROVIDER_SITE_OTHER): Payer: Managed Care, Other (non HMO) | Admitting: *Deleted

## 2019-10-30 DIAGNOSIS — Z23 Encounter for immunization: Secondary | ICD-10-CM | POA: Diagnosis not present

## 2019-11-07 ENCOUNTER — Ambulatory Visit: Payer: Managed Care, Other (non HMO) | Admitting: Physical Therapy

## 2019-11-07 ENCOUNTER — Other Ambulatory Visit: Payer: Self-pay

## 2019-11-07 DIAGNOSIS — M5441 Lumbago with sciatica, right side: Secondary | ICD-10-CM | POA: Diagnosis not present

## 2019-11-07 NOTE — Therapy (Signed)
[facility] [place], [place], [address] Phone: [phone]   Fax:  [phone]  Physical Therapy Treatment  Patient Details  Name: Charles Santos MRN: [id number] Date of Birth: 25-Sep-1971 Referring Provider (PT): Darla Lesches   Encounter Date: 11/07/2019  PT End of Session - 11/07/19 1437    Visit Number  6    Number of Visits  12    Date for PT Re-Evaluation  11/12/19    PT Start Time  0105    PT Stop Time  0159    PT Time Calculation (min)  54 min    Activity Tolerance  Patient tolerated treatment well    Behavior During Therapy  [facility] for tasks assessed/performed       Past Medical History:  Diagnosis Date  . Anxiety   . Cancer of transverse colon ([place]) 09/17/2016   surgery done  . Colon cancer [facility]) 1998   surgery and chemo  . Complication of anesthesia    not fully under for polyp removal at [place] 2017  . Depression   . Emphysema of lung ([place])   . Family hx of colon cancer   . GERD (gastroesophageal reflux disease)    none recent  . Headache   . Hyperlipidemia   . Hypertension   . Vitamin D deficiency     Past Surgical History:  Procedure Laterality Date  . APPENDECTOMY    . colon resectomy  1998   2 degree herida  . COLONOSCOPY    . ESOPHAGOGASTRODUODENOSCOPY (EGD) WITH PROPOFOL N/A 05/19/2017   Procedure: ESOPHAGOGASTRODUODENOSCOPY (EGD) WITH PROPOFOL;  Surgeon: [name], MD;  [facility];  Service: Endoscopy;  Laterality: N/A;  . FLEXIBLE SIGMOIDOSCOPY N/A 05/19/2017   Procedure: FLEXIBLE SIGMOIDOSCOPY;  Surgeon: [name], MD;  [facility];  Service: Endoscopy;  Laterality: N/A;  . INGUINAL HERNIA REPAIR    . LAPAROSCOPIC SUBTOTAL COLECTOMY N/A 09/17/2016   Procedure: DIAGNOSTIC LAPAROSCOPY EXPLORATORY LAPAROTOMY SUBTOTAL COLECTOMY PROCTOSCOPY;  Surgeon: [name], MD;  [facility];  Service: General;  Laterality: N/A;  . polyp removal at [place]   2017  . PROCTOSCOPY  09/17/2016   Procedure: PROCTOSCOPY;  Surgeon: [name], MD;  [facility];  Service: General;;  . UPPER GI ENDOSCOPY      There were no vitals filed for this visit.  Subjective Assessment - 11/07/19 1345    Subjective  COVID-19 screen performed prior to patient entering clinic.  Back hurt a lot yesteday.  Low pain today.    Pertinent History  H/o colon cancer, Inguinal hernia surgery,    How long can you walk comfortably?  Community distance.    Diagnostic tests  X-ray.    Patient Stated Goals  Get X-ray.    Currently in Pain?  Yes    Pain Score  2                        [facility] Adult PT Treatment/Exercise - 11/07/19 0001      Exercises   Exercises  Knee/Hip      Knee/Hip Exercises: Aerobic   Nustep  Level 5 x 16 minutes.      Modalities   Modalities  Electrical Stimulation      Moist Heat Therapy   Number Minutes Moist Heat  20 Minutes    Moist Heat Location  Lumbar Spine      Electrical Stimulation   Electrical Stimulation Location  RT LB    Electrical Stimulation Action  Pre-mod.    Electrical Stimulation Parameters  80-150 Hz x 20 minutes.    Electrical Stimulation Goals  Tone;Pain      Ultrasound   Ultrasound Location  RT LB    Ultrasound Parameters  U/S at 1.50 W/CM2 x 7 minutes.    Ultrasound Goals  Pain                  PT Long Term Goals - 10/01/19 1249      PT LONG TERM GOAL #1   Title  Independent with a HEP.    Time  6    Period  Weeks    Status  New      PT LONG TERM GOAL #2   Title  Perform ADL's with pain not > 2-3/10.    Time  6    Period  Weeks    Status  New      PT LONG TERM GOAL #3   Title  Eliminate right LE symptoms.    Time  6    Period  Weeks    Status  New            Plan - 11/07/19 1455    Clinical Impression Statement  Patient having a better day today.  Yesterday he reported a high pain-level with pain down his right LE.  Patient tolerated increased cadence on  Nustep today without complaint.    Personal Factors and Comorbidities  Comorbidity 1;Comorbidity 2    Comorbidities  H/o colon cancer, Inguinal hernia surgery,    Examination-Activity Limitations  Locomotion Level;Other    Examination-Participation Restrictions  Other    Stability/Clinical Decision Making  Evolving/Moderate complexity    Rehab Potential  Good    PT Frequency  2x / week    PT Duration  6 weeks    PT Treatment/Interventions  ADLs/Self Care Home Management;Cryotherapy;Electrical Stimulation;Ultrasound;Moist Heat;Therapeutic activities;Therapeutic exercise;Manual techniques;Patient/family education;Passive range of motion    PT Next Visit Plan  S and DKTC, hip bridges, modalites and STW/M to right SIJ region.    Consulted and Agree with Plan of Care  Patient       Patient will benefit from skilled therapeutic intervention in order to improve the following deficits and impairments:  Pain, Decreased activity tolerance, Decreased range of motion  Visit Diagnosis: Acute right-sided low back pain with right-sided sciatica     Problem List Patient Active Problem List   Diagnosis Date Noted  . Essential hypertension 09/26/2019  . Chronic right-sided low back pain with right-sided sciatica 09/26/2019  . Recurrent right inguinal hernia 06/26/2019  . Hypertriglyceridemia 11/24/2018  . Anxiety 02/16/2018  . Elevated CEA   . [name]   . Cancer of transverse colon ([place]) 09/17/2016  . S/P colonoscopic polypectomy   . Hematochezia   . Depression 06/01/2016  . Alcohol abuse, in remission 06/01/2016  . Tobacco abuse 06/01/2016  . Vitamin D deficiency 10/17/2015  . Hyperlipidemia 10/17/2015  . History of malignant neoplasm of colon without staging 09/22/2010    Charles Santos, [place] MPT 11/07/2019, 2:59 PM  [facility] [address] [place], [place], [address] Phone: [phone]   Fax:  [phone]  Name: Charles Santos MRN:  [id number] Date of Birth: 14-Nov-1971

## 2019-11-13 ENCOUNTER — Ambulatory Visit: Payer: Managed Care, Other (non HMO) | Attending: Family Medicine | Admitting: Physical Therapy

## 2019-11-13 ENCOUNTER — Other Ambulatory Visit: Payer: Self-pay

## 2019-11-13 ENCOUNTER — Encounter: Payer: Self-pay | Admitting: Physical Therapy

## 2019-11-13 DIAGNOSIS — M5441 Lumbago with sciatica, right side: Secondary | ICD-10-CM | POA: Diagnosis not present

## 2019-11-13 NOTE — Therapy (Signed)
[facility] [place], [place], [address] Phone: [phone]   Fax:  [phone]  Physical Therapy Treatment  Patient Details  Name: Charles Santos MRN: [id number] Date of Birth: 03/17/1971 Referring Provider (PT): Darla Lesches   Encounter Date: 11/13/2019  PT End of Session - 11/13/19 1519    Visit Number  7    Number of Visits  12    Date for PT Re-Evaluation  11/12/19    PT Start Time  1518    PT Stop Time  1604    PT Time Calculation (min)  46 min    Activity Tolerance  Patient tolerated treatment well    Behavior During Therapy  [facility] for tasks assessed/performed       Past Medical History:  Diagnosis Date  . Anxiety   . Cancer of transverse colon ([place]) 09/17/2016   surgery done  . Colon cancer [facility]) 1998   surgery and chemo  . Complication of anesthesia    not fully under for polyp removal at [place] 2017  . Depression   . Emphysema of lung ([place])   . Family hx of colon cancer   . GERD (gastroesophageal reflux disease)    none recent  . Headache   . Hyperlipidemia   . Hypertension   . Vitamin D deficiency     Past Surgical History:  Procedure Laterality Date  . APPENDECTOMY    . colon resectomy  1998   2 degree herida  . COLONOSCOPY    . ESOPHAGOGASTRODUODENOSCOPY (EGD) WITH PROPOFOL N/A 05/19/2017   Procedure: ESOPHAGOGASTRODUODENOSCOPY (EGD) WITH PROPOFOL;  Surgeon: [name], MD;  [facility];  Service: Endoscopy;  Laterality: N/A;  . FLEXIBLE SIGMOIDOSCOPY N/A 05/19/2017   Procedure: FLEXIBLE SIGMOIDOSCOPY;  Surgeon: [name], MD;  [facility];  Service: Endoscopy;  Laterality: N/A;  . INGUINAL HERNIA REPAIR    . LAPAROSCOPIC SUBTOTAL COLECTOMY N/A 09/17/2016   Procedure: DIAGNOSTIC LAPAROSCOPY EXPLORATORY LAPAROTOMY SUBTOTAL COLECTOMY PROCTOSCOPY;  Surgeon: [name], MD;  [facility]: [facility];  Service: General;  Laterality: N/A;  . polyp removal at [place]   2017  . PROCTOSCOPY  09/17/2016   Procedure: PROCTOSCOPY;  Surgeon: [name], MD;  [facility]: [facility];  Service: General;;  . UPPER GI ENDOSCOPY      There were no vitals filed for this visit.  Subjective Assessment - 11/13/19 1518    Subjective  COVID-19 screen performed prior to patient entering clinic. "A little" LBP upon arrival in R low back. Reports anteriorly rotating low back and pelvis while driving back from [place] over the weekend and experienced a pop and corresponding LBP.    Pertinent History  H/o colon cancer, Inguinal hernia surgery,    How long can you walk comfortably?  Community distance.    Diagnostic tests  X-ray.    Patient Stated Goals  Get X-ray.    Currently in Pain?  Yes    Pain Score  3     Pain Location  Back    Pain Orientation  Right;Lower    Pain Descriptors / Indicators  Discomfort    Pain Type  Acute pain    Pain Onset  More than a month ago    Pain Frequency  Intermittent         [facility] PT Assessment - 11/13/19 0001      Assessment   Medical Diagnosis  Chronic right-sided LBP with right sided sciatica.    Referring Provider (  PT)  Darla Lesches    Next MD Visit  None at this time      Precautions   Precautions  None      Restrictions   Weight Bearing Restrictions  No                   [facility] Adult PT Treatment/Exercise - 11/13/19 0001      Exercises   Exercises  Lumbar;Knee/Hip      Lumbar Exercises: Stretches   Single Knee to Chest Stretch  Right;Left;3 reps;30 seconds      Lumbar Exercises: Aerobic   Nustep  L5 x16 min LEs only      Lumbar Exercises: Supine   Bridge  15 reps;3 seconds    Straight Leg Raise  15 reps;2 seconds      Knee/Hip Exercises: Aerobic   Nustep  --      Modalities   Modalities  Electrical Stimulation;Moist Heat      Moist Heat Therapy   Number Minutes Moist Heat  15 Minutes    Moist Heat Location  Lumbar Spine      Electrical Stimulation   Electrical Stimulation Location  B low  back    Electrical Stimulation Action  Pre-Mod    Electrical Stimulation Parameters  80-150 hz x15 min    Electrical Stimulation Goals  Tone;Pain                  PT Long Term Goals - 10/01/19 1249      PT LONG TERM GOAL #1   Title  Independent with a HEP.    Time  6    Period  Weeks    Status  New      PT LONG TERM GOAL #2   Title  Perform ADL's with pain not > 2-3/10.    Time  6    Period  Weeks    Status  New      PT LONG TERM GOAL #3   Title  Eliminate right LE symptoms.    Time  6    Period  Weeks    Status  New            Plan - 11/13/19 1559    Clinical Impression Statement  Patient presented in clinic with pain and reports of a pop in lumbar spine after moving while on a long drive. Patient indicated pain still in BLE but especially in RLE and R low back. VCs to take his time with exercises and to not rush were provided during therex. Core activation VCs also provided during supine core exercises. Normal modalities response noted following removal of the modalities.    Personal Factors and Comorbidities  Comorbidity 1;Comorbidity 2    Comorbidities  H/o colon cancer, Inguinal hernia surgery,    Examination-Activity Limitations  Locomotion Level;Other    Examination-Participation Restrictions  Other    Stability/Clinical Decision Making  Evolving/Moderate complexity    Rehab Potential  Good    PT Frequency  2x / week    PT Duration  6 weeks    PT Treatment/Interventions  ADLs/Self Care Home Management;Cryotherapy;Electrical Stimulation;Ultrasound;Moist Heat;Therapeutic activities;Therapeutic exercise;Manual techniques;Patient/family education;Passive range of motion    PT Next Visit Plan  S and DKTC, hip bridges, modalites and STW/M to right SIJ region.    Consulted and Agree with Plan of Care  Patient       Patient will benefit from skilled therapeutic intervention in order to improve the following deficits and impairments:  Pain, Decreased  activity tolerance, Decreased range of motion  Visit Diagnosis: Acute right-sided low back pain with right-sided sciatica     Problem List Patient Active Problem List   Diagnosis Date Noted  . Essential hypertension 09/26/2019  . Chronic right-sided low back pain with right-sided sciatica 09/26/2019  . Recurrent right inguinal hernia 06/26/2019  . Hypertriglyceridemia 11/24/2018  . Anxiety 02/16/2018  . Elevated CEA   . [name]   . Cancer of transverse colon ([place]) 09/17/2016  . S/P colonoscopic polypectomy   . Hematochezia   . Depression 06/01/2016  . Alcohol abuse, in remission 06/01/2016  . Tobacco abuse 06/01/2016  . Vitamin D deficiency 10/17/2015  . Hyperlipidemia 10/17/2015  . History of malignant neoplasm of colon without staging 09/22/2010    Standley Brooking, PTA 11/13/2019, 4:08 PM  [facility] [address] [place], [place], [address] Phone: [phone]   Fax:  [phone]  Name: Charles Santos MRN: [id number] Date of Birth: January 05, 1971

## 2019-11-16 ENCOUNTER — Other Ambulatory Visit: Payer: Self-pay

## 2019-11-16 ENCOUNTER — Encounter: Payer: Self-pay | Admitting: Physical Therapy

## 2019-11-16 ENCOUNTER — Ambulatory Visit: Payer: Managed Care, Other (non HMO) | Admitting: Physical Therapy

## 2019-11-16 DIAGNOSIS — M5441 Lumbago with sciatica, right side: Secondary | ICD-10-CM

## 2019-11-16 NOTE — Therapy (Signed)
[facility] [place], [place], [address] Phone: [phone]   Fax:  [phone]  Physical Therapy Treatment  Patient Details  Name: Charles Santos MRN: [id number] Date of Birth: 04/06/1971 Referring Provider (PT): Darla Lesches   Encounter Date: 11/16/2019  PT End of Session - 11/16/19 1035    Visit Number  8    Number of Visits  12    Date for PT Re-Evaluation  11/12/19    PT Start Time  1033    PT Stop Time  1120    PT Time Calculation (min)  47 min    Activity Tolerance  Patient tolerated treatment well    Behavior During Therapy  [facility] for tasks assessed/performed       Past Medical History:  Diagnosis Date  . Anxiety   . Cancer of transverse colon ([place]) 09/17/2016   surgery done  . Colon cancer [facility]) 1998   surgery and chemo  . Complication of anesthesia    not fully under for polyp removal at [place] 2017  . Depression   . Emphysema of lung ([place])   . Family hx of colon cancer   . GERD (gastroesophageal reflux disease)    none recent  . Headache   . Hyperlipidemia   . Hypertension   . Vitamin D deficiency     Past Surgical History:  Procedure Laterality Date  . APPENDECTOMY    . colon resectomy  1998   2 degree herida  . COLONOSCOPY    . ESOPHAGOGASTRODUODENOSCOPY (EGD) WITH PROPOFOL N/A 05/19/2017   Procedure: ESOPHAGOGASTRODUODENOSCOPY (EGD) WITH PROPOFOL;  Surgeon: [name], MD;  [facility];  Service: Endoscopy;  Laterality: N/A;  . FLEXIBLE SIGMOIDOSCOPY N/A 05/19/2017   Procedure: FLEXIBLE SIGMOIDOSCOPY;  Surgeon: [name], MD;  [facility];  Service: Endoscopy;  Laterality: N/A;  . INGUINAL HERNIA REPAIR    . LAPAROSCOPIC SUBTOTAL COLECTOMY N/A 09/17/2016   Procedure: DIAGNOSTIC LAPAROSCOPY EXPLORATORY LAPAROTOMY SUBTOTAL COLECTOMY PROCTOSCOPY;  Surgeon: [name], MD;  [facility];  Service: General;  Laterality: N/A;  . polyp removal at [place]   2017  . PROCTOSCOPY  09/17/2016   Procedure: PROCTOSCOPY;  Surgeon: [name], MD;  [facility];  Service: General;;  . UPPER GI ENDOSCOPY      There were no vitals filed for this visit.  Subjective Assessment - 11/16/19 1034    Subjective  COVID 19 screening performed on patient upon arrival. 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    Pertinent History  H/o colon cancer, Inguinal hernia surgery,    How long can you walk comfortably?  Community distance.    Diagnostic tests  X-ray.    Patient Stated Goals  Get X-ray.    Currently in Pain?  Yes    Pain Score  9     Pain Location  Back    Pain Orientation  Lower    Pain Descriptors / Indicators  Sore;Aching;Sharp;Throbbing    Pain Type  Acute pain    Pain Onset  More than a month ago    Pain Frequency  Constant         [facility] PT Assessment - 11/16/19 0001      Assessment   Medical Diagnosis  Chronic right-sided LBP with right sided sciatica.    Referring Provider (PT)  Darla Lesches    Next MD Visit  None at this time  Precautions   Precautions  None      Restrictions   Weight Bearing Restrictions  No                   [facility] Adult PT Treatment/Exercise - 11/16/19 0001      Lumbar Exercises: Aerobic   Nustep  L5 x16 min LEs only      Modalities   Modalities  Electrical Stimulation;Moist Heat;Ultrasound      Moist Heat Therapy   Number Minutes Moist Heat  15 Minutes    Moist Heat Location  Lumbar Spine      Electrical Stimulation   Electrical Stimulation Location  B low back    Electrical Stimulation Action  Pre-Mod    Electrical Stimulation Parameters  80-150 hz x15 min    Electrical Stimulation Goals  Tone;Pain      Ultrasound   Ultrasound Location  R low back    Ultrasound Parameters  Combo 1.5 w/cm2, 100%, 1 mhz x10 min    Ultrasound Goals  Pain                  PT Long Term Goals - 10/01/19 1249      PT LONG TERM GOAL #1   Title   Independent with a HEP.    Time  6    Period  Weeks    Status  New      PT LONG TERM GOAL #2   Title  Perform ADL's with pain not > 2-3/10.    Time  6    Period  Weeks    Status  New      PT LONG TERM GOAL #3   Title  Eliminate right LE symptoms.    Time  6    Period  Weeks    Status  New            Plan - 11/16/19 1113    Clinical Impression Statement  Patient presented in clinic with reports of increased pain since returning to work and [employer] for work duties. 9/10 R LBP reported with intermittant L LBP as well. Area of inflammation noted just proximal to R superior iliac crest. Normal modalities response noted following removal of the modalities.    Personal Factors and Comorbidities  Comorbidity 1;Comorbidity 2    Comorbidities  H/o colon cancer, Inguinal hernia surgery,    Examination-Activity Limitations  Locomotion Level;Other    Examination-Participation Restrictions  Other    Stability/Clinical Decision Making  Evolving/Moderate complexity    Rehab Potential  Good    PT Frequency  2x / week    PT Duration  6 weeks    PT Treatment/Interventions  ADLs/Self Care Home Management;Cryotherapy;Electrical Stimulation;Ultrasound;Moist Heat;Therapeutic activities;Therapeutic exercise;Manual techniques;Patient/family education;Passive range of motion    PT Next Visit Plan  S and DKTC, hip bridges, modalites and STW/M to right SIJ region.    Consulted and Agree with Plan of Care  Patient       Patient will benefit from skilled therapeutic intervention in order to improve the following deficits and impairments:  Pain, Decreased activity tolerance, Decreased range of motion  Visit Diagnosis: Acute right-sided low back pain with right-sided sciatica     Problem List Patient Active Problem List   Diagnosis Date Noted  . Essential hypertension 09/26/2019  . Chronic right-sided low back pain with right-sided sciatica 09/26/2019  . Recurrent right inguinal hernia  06/26/2019  . Hypertriglyceridemia 11/24/2018  . Anxiety 02/16/2018  . Elevated CEA   .  [name]   . Cancer of transverse colon ([place]) 09/17/2016  . S/P colonoscopic polypectomy   . Hematochezia   . Depression 06/01/2016  . Alcohol abuse, in remission 06/01/2016  . Tobacco abuse 06/01/2016  . Vitamin D deficiency 10/17/2015  . Hyperlipidemia 10/17/2015  . History of malignant neoplasm of colon without staging 09/22/2010    Standley Brooking, PTA 11/16/2019, 12:09 PM  [facility] [address] [place], [place], [address] Phone: [phone]   Fax:  [phone]  Name: Charles Santos MRN: [id number] Date of Birth: Nov 14, 1971

## 2019-11-26 ENCOUNTER — Telehealth: Payer: Self-pay | Admitting: Family Medicine

## 2019-11-26 NOTE — Telephone Encounter (Signed)
Needs another appointment to document ongoing or worsening symptoms.

## 2019-11-26 NOTE — Telephone Encounter (Signed)
REFERRAL REQUEST Telephone Note  What type of referral do you need? MRI  Have you been seen at our office for this problem? Yes states Rakes is aware (Advise that they will likely need an appointment with their PCP before a referral can be done)  Is there a particular doctor or location that you prefer? [place]  Patient notified that referrals can take up to a week or longer to process. If they haven't heard anything within a week they should call back and speak with the referral department.

## 2019-11-27 ENCOUNTER — Ambulatory Visit: Payer: Managed Care, Other (non HMO) | Admitting: Physical Therapy

## 2019-11-27 ENCOUNTER — Other Ambulatory Visit: Payer: Self-pay

## 2019-11-27 DIAGNOSIS — M5441 Lumbago with sciatica, right side: Secondary | ICD-10-CM

## 2019-11-27 NOTE — Therapy (Signed)
[facility] [place], [place], [address] Phone: [phone]   Fax:  [phone]  Physical Therapy Treatment  Patient Details  Name: Charles Santos MRN: [id number] Date of Birth: 1971/02/20 Referring Provider (PT): Darla Lesches   Encounter Date: 11/27/2019  PT End of Session - 11/27/19 1511    Visit Number  9    Number of Visits  12    Date for PT Re-Evaluation  11/12/19    PT Start Time  0230    PT Stop Time  0327    PT Time Calculation (min)  57 min    Activity Tolerance  Patient tolerated treatment well    Behavior During Therapy  [facility] for tasks assessed/performed       Past Medical History:  Diagnosis Date  . Anxiety   . Cancer of transverse colon ([place]) 09/17/2016   surgery done  . Colon cancer [facility]) 1998   surgery and chemo  . Complication of anesthesia    not fully under for polyp removal at [place] 2017  . Depression   . Emphysema of lung ([place])   . Family hx of colon cancer   . GERD (gastroesophageal reflux disease)    none recent  . Headache   . Hyperlipidemia   . Hypertension   . Vitamin D deficiency     Past Surgical History:  Procedure Laterality Date  . APPENDECTOMY    . colon resectomy  1998   2 degree herida  . COLONOSCOPY    . ESOPHAGOGASTRODUODENOSCOPY (EGD) WITH PROPOFOL N/A 05/19/2017   Procedure: ESOPHAGOGASTRODUODENOSCOPY (EGD) WITH PROPOFOL;  Surgeon: [name], MD;  [facility];  Service: Endoscopy;  Laterality: N/A;  . FLEXIBLE SIGMOIDOSCOPY N/A 05/19/2017   Procedure: FLEXIBLE SIGMOIDOSCOPY;  Surgeon: [name], MD;  [facility];  Service: Endoscopy;  Laterality: N/A;  . INGUINAL HERNIA REPAIR    . LAPAROSCOPIC SUBTOTAL COLECTOMY N/A 09/17/2016   Procedure: DIAGNOSTIC LAPAROSCOPY EXPLORATORY LAPAROTOMY SUBTOTAL COLECTOMY PROCTOSCOPY;  Surgeon: [name], MD;  [facility];  Service: General;  Laterality: N/A;  . polyp removal at [place]   2017  . PROCTOSCOPY  09/17/2016   Procedure: PROCTOSCOPY;  Surgeon: [name], MD;  [facility];  Service: General;;  . UPPER GI ENDOSCOPY      There were no vitals filed for this visit.  Subjective Assessment - 11/27/19 1506    Subjective  COVID-19 screen performed prior to patient entering clinic.  The more I'm up the worse it gets.  Patient states that while at work today his left thigh went numb and both his feet.  He is wanting an MRI.    Pertinent History  H/o colon cancer, Inguinal hernia surgery,    How long can you walk comfortably?  Community distance.    Diagnostic tests  X-ray.    Patient Stated Goals  Get X-ray.                       [facility] Adult PT Treatment/Exercise - 11/27/19 0001      Exercises   Exercises  Knee/Hip      Lumbar Exercises: Aerobic   Nustep  Level 4 x 15 minutes.      Modalities   Modalities  Electrical Stimulation;Moist Heat      Moist Heat Therapy   Number Minutes Moist Heat  20 Minutes    Moist Heat Location  Lumbar Spine  Acupuncturist Location  Bil low back.    Electrical Stimulation Action  Pre-mod.    Electrical Stimulation Parameters  80-150 Hz x 20 minutes.    Electrical Stimulation Goals  Tone;Pain      Ultrasound   Ultrasound Location  RT low back.    Ultrasound Parameters  U/S at 1.50 W/CM2 x 8 minutes.    Ultrasound Goals  Pain                  PT Long Term Goals - 10/01/19 1249      PT LONG TERM GOAL #1   Title  Independent with a HEP.    Time  6    Period  Weeks    Status  New      PT LONG TERM GOAL #2   Title  Perform ADL's with pain not > 2-3/10.    Time  6    Period  Weeks    Status  New      PT LONG TERM GOAL #3   Title  Eliminate right LE symptoms.    Time  6    Period  Weeks    Status  New            Plan - 11/27/19 1511    Clinical Impression Statement  Patient continues to report bilateral LE symptoms that includes  numbness in feet.  He reports that symptoms increase the longer he is on his feet at work.  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  He is interested in getting an MRI.    Personal Factors and Comorbidities  Comorbidity 1;Comorbidity 2    Comorbidities  H/o colon cancer, Inguinal hernia surgery,    Examination-Activity Limitations  Locomotion Level;Other    Examination-Participation Restrictions  Other    Stability/Clinical Decision Making  Evolving/Moderate complexity    Rehab Potential  Good    PT Frequency  2x / week    PT Duration  6 weeks    PT Treatment/Interventions  ADLs/Self Care Home Management;Cryotherapy;Electrical Stimulation;Ultrasound;Moist Heat;Therapeutic activities;Therapeutic exercise;Manual techniques;Patient/family education;Passive range of motion    PT Next Visit Plan  S and DKTC, hip bridges, modalites and STW/M to right SIJ region.    Consulted and Agree with Plan of Care  Patient       Patient will benefit from skilled therapeutic intervention in order to improve the following deficits and impairments:  Pain, Decreased activity tolerance, Decreased range of motion  Visit Diagnosis: Acute right-sided low back pain with right-sided sciatica     Problem List Patient Active Problem List   Diagnosis Date Noted  . Essential hypertension 09/26/2019  . Chronic right-sided low back pain with right-sided sciatica 09/26/2019  . Recurrent right inguinal hernia 06/26/2019  . Hypertriglyceridemia 11/24/2018  . Anxiety 02/16/2018  . Elevated CEA   . [name]   . Cancer of transverse colon ([place]) 09/17/2016  . S/P colonoscopic polypectomy   . Hematochezia   . Depression 06/01/2016  . Alcohol abuse, in remission 06/01/2016  . Tobacco abuse 06/01/2016  . Vitamin D deficiency 10/17/2015  . Hyperlipidemia 10/17/2015  . History of malignant neoplasm of colon without staging 09/22/2010    Charles Santos, [place] MPT 11/27/2019, 3:24  PM  [facility] [address] [place], [place], [address] Phone: [phone]   Fax:  [phone]  Name: Charles Santos MRN: [id number] Date of Birth: 08/08/1971

## 2019-11-27 NOTE — Telephone Encounter (Signed)
Left patient detailed message.

## 2019-12-05 ENCOUNTER — Ambulatory Visit: Payer: Managed Care, Other (non HMO) | Admitting: Family Medicine

## 2019-12-05 ENCOUNTER — Other Ambulatory Visit: Payer: Self-pay

## 2019-12-05 ENCOUNTER — Encounter: Payer: Self-pay | Admitting: Family Medicine

## 2019-12-05 VITALS — BP 122/62 | HR 91 | Temp 97.8°F | Resp 20 | Ht 70.0 in | Wt 208.0 lb

## 2019-12-05 DIAGNOSIS — G8929 Other chronic pain: Secondary | ICD-10-CM

## 2019-12-05 DIAGNOSIS — M48061 Spinal stenosis, lumbar region without neurogenic claudication: Secondary | ICD-10-CM

## 2019-12-05 DIAGNOSIS — M5441 Lumbago with sciatica, right side: Secondary | ICD-10-CM | POA: Diagnosis not present

## 2019-12-05 DIAGNOSIS — M5417 Radiculopathy, lumbosacral region: Secondary | ICD-10-CM | POA: Insufficient documentation

## 2019-12-05 DIAGNOSIS — M4306 Spondylolysis, lumbar region: Secondary | ICD-10-CM

## 2019-12-05 MED ORDER — METHYLPREDNISOLONE ACETATE 40 MG/ML IJ SUSP
40.0000 mg | Freq: Once | INTRAMUSCULAR | Status: AC
  Administered 2019-12-05: 12:00:00 40 mg via INTRAMUSCULAR

## 2019-12-05 NOTE — Patient Instructions (Signed)

## 2019-12-05 NOTE — Progress Notes (Signed)
Subjective:    Charles Santos is a 48 y.o. male who presents for follow up of low back problems. Current symptoms include: numbness in right thigh, pain in lower back, right leg, and left leg (aching, burning, sharp, shooting, stabbing and throbbing in character; 7/10 in severity), paresthesias in right leg and stiffness in lower back and left hip. Symptoms have gradually worsening from the previous visit. Exacerbating factors identified by the patient are bending backwards, bending forwards, bending sideways and walking. Denies saddle anesthesia or loss of bowel or bladder function. No focal deficits.   The following portions of the patient's history were reviewed and updated as appropriate: allergies, current medications, past family history, past medical history, past social history, past surgical history and problem list.    Review of Systems  Constitutional: Negative for chills, diaphoresis, fever, malaise/fatigue and weight loss.  Respiratory: Negative for cough and shortness of breath.   Cardiovascular: Negative for chest pain, palpitations and claudication.  Gastrointestinal: Negative for abdominal pain and diarrhea.  Genitourinary: Negative for dysuria and flank pain.  Musculoskeletal: Positive for myalgias. Negative for back pain, falls and joint pain.  All other systems reviewed and are negative.   Objective:    BP 122/62   Pulse 91   Temp 97.8 F (36.6 C)   Resp 20   Ht 5\' 10"  (1.778 m)   Wt 208 lb (94.3 kg)   SpO2 97%   BMI 29.84 kg/m  General appearance: alert, cooperative and no distress Head: Normocephalic, without obvious abnormality, atraumatic Eyes: negative Ears: normal hearing Nose: Nares normal. Septum midline. Mucosa normal. No drainage or sinus tenderness. Throat: lips, mucosa, and tongue normal; teeth and gums normal Neck: no adenopathy, no carotid bruit, no JVD, supple, symmetrical, trachea midline and thyroid not enlarged, symmetric, no  tenderness/mass/nodules Back: tenderness to lumbar spine and lumbar paraspinous muscles, positive SLR test on right, limited ROM due to pain Lungs: clear to auscultation bilaterally Heart: regular rate and rhythm, S1, S2 normal, no murmur, click, rub or gallop Extremities: extremities normal, atraumatic, no cyanosis or edema Pulses: 2+ and symmetric Skin: Skin color, texture, turgor normal. No rashes or lesions Neurologic: Grossly normal      Assessment and Plan Damarii was seen today for back pain.  Diagnoses and all orders for this visit:  Chronic right-sided low back pain with right-sided sciatica Radiculopathy of lumbosacral region Due to ongoing and worsening, will order imaging. Will refer to ortho once imaging has resulted. Will give burst of steroids to see if beneficial for radiculopathy symptoms. No red flags present concerning for cauda equina syndrome. Pt aware to report any new, worsening, or persistent symptoms.  -     Cancel: MR Lumbar Spine Wo Contrast; Future -     methylPREDNISolone acetate (DEPO-MEDROL) injection 40 mg -     MR Lumbar Spine Wo Contrast; Future   Return in about 1 month (around 01/05/2020), or if symptoms worsen or fail to improve, for back pain.  The above assessment and management plan was discussed with the patient. The patient verbalized understanding of and has agreed to the management plan. Patient is aware to call the clinic if they develop any new symptoms or if symptoms fail to improve or worsen. Patient is aware when to return to the clinic for a follow-up visit. Patient educated on when it is appropriate to go to the emergency department.   Monia Pouch, FNP-C [facility] [address] [place], [place] [address] [phone]  [phone]  

## 2019-12-17 ENCOUNTER — Telehealth: Payer: Self-pay | Admitting: Family Medicine

## 2019-12-19 ENCOUNTER — Telehealth: Payer: Self-pay | Admitting: Family Medicine

## 2019-12-19 ENCOUNTER — Other Ambulatory Visit: Payer: Self-pay | Admitting: Family Medicine

## 2019-12-19 DIAGNOSIS — I10 Essential (primary) hypertension: Secondary | ICD-10-CM

## 2019-12-19 NOTE — Telephone Encounter (Signed)
Noted. FYI 

## 2019-12-21 ENCOUNTER — Ambulatory Visit (HOSPITAL_COMMUNITY): Payer: 59

## 2019-12-25 ENCOUNTER — Other Ambulatory Visit: Payer: Self-pay | Admitting: Family Medicine

## 2019-12-25 DIAGNOSIS — G8929 Other chronic pain: Secondary | ICD-10-CM

## 2019-12-28 ENCOUNTER — Telehealth: Payer: Self-pay | Admitting: Family Medicine

## 2019-12-28 ENCOUNTER — Ambulatory Visit (HOSPITAL_COMMUNITY)
Admission: RE | Admit: 2019-12-28 | Discharge: 2019-12-28 | Disposition: A | Discharge status: Home / Self Care | Payer: 59 | Source: Ambulatory Visit | Attending: Family Medicine | Admitting: Family Medicine

## 2019-12-28 ENCOUNTER — Other Ambulatory Visit: Payer: Self-pay

## 2019-12-28 ENCOUNTER — Other Ambulatory Visit: Payer: Self-pay | Admitting: Family Medicine

## 2019-12-28 DIAGNOSIS — G8929 Other chronic pain: Secondary | ICD-10-CM

## 2019-12-28 DIAGNOSIS — M5441 Lumbago with sciatica, right side: Secondary | ICD-10-CM | POA: Insufficient documentation

## 2019-12-28 DIAGNOSIS — M5417 Radiculopathy, lumbosacral region: Secondary | ICD-10-CM

## 2019-12-28 DIAGNOSIS — M48061 Spinal stenosis, lumbar region without neurogenic claudication: Secondary | ICD-10-CM | POA: Insufficient documentation

## 2019-12-28 DIAGNOSIS — M4306 Spondylolysis, lumbar region: Secondary | ICD-10-CM | POA: Insufficient documentation

## 2019-12-28 MED ORDER — NAPROXEN 500 MG PO TABS: 500.0000 mg | ORAL_TABLET | Freq: Two times a day (BID) | ORAL | 1 refills | Status: DC

## 2019-12-28 NOTE — Telephone Encounter (Signed)
Saw MRI results today and placed referral to neurosurgery. Will refill naproxen. Needs to see neurosurgery.

## 2019-12-28 NOTE — Telephone Encounter (Signed)
Patient aware of results and recommendations. °

## 2019-12-28 NOTE — Addendum Note (Signed)
Addended by: Baruch Gouty on: 12/28/2019 03:28 PM   Modules accepted: Orders

## 2019-12-28 NOTE — Telephone Encounter (Signed)
Charles Santos from [facility] called requesting to speak with PCP or nurse regarding results on MRI.

## 2019-12-28 NOTE — Telephone Encounter (Signed)
Pt would like refill on naproxen, he switched back to naproxen at last visit. This is not on current med list. He also had MRI which you should get results tomorrow. He will be working 7a-7p on Monday & Tuesday, you can leave a message and he will call back when he gets a chance

## 2019-12-28 NOTE — Telephone Encounter (Signed)
What is the name of the medication? Naproxen 500mg  tablet  Have you contacted your pharmacy to request a refill? Yes  Which pharmacy would you like this sent to? [facility]   Patient notified that their request is being sent to the clinical staff for review and that they should receive a call once it is complete. If they do not receive a call within 24 hours they can check with their pharmacy or our office.   Rakes' pt  He wants to go back on this for back & legs.

## 2019-12-28 NOTE — Telephone Encounter (Signed)
Called radiology =was a call report = rakes aware to look at it

## 2020-01-16 ENCOUNTER — Telehealth: Payer: Self-pay | Admitting: Family Medicine

## 2020-01-16 ENCOUNTER — Other Ambulatory Visit: Payer: Self-pay | Admitting: Family Medicine

## 2020-01-16 DIAGNOSIS — G8929 Other chronic pain: Secondary | ICD-10-CM

## 2020-01-16 MED ORDER — PREDNISONE 20 MG PO TABS: ORAL_TABLET | ORAL | 0 refills | Status: DC

## 2020-01-16 NOTE — Telephone Encounter (Signed)
He can try topical Voltaren gel over the counter to see if beneficial. Will also send in a burst of steroids.

## 2020-01-16 NOTE — Telephone Encounter (Signed)
Pt aware.

## 2020-01-25 ENCOUNTER — Telehealth: Payer: Self-pay | Admitting: Family Medicine

## 2020-01-25 NOTE — Telephone Encounter (Signed)
Patient aware.

## 2020-01-25 NOTE — Telephone Encounter (Signed)
What is the name of the medication? predniSONE (DELTASONE) 20 MG tablet  Have you contacted your pharmacy to request a refill? No needs refill. rx is really helping and he is out  Which pharmacy would you like this sent to? [facility]   Patient notified that their request is being sent to the clinical staff for review and that they should receive a call once it is complete. If they do not receive a call within 24 hours they can check with their pharmacy or our office.

## 2020-01-25 NOTE — Telephone Encounter (Signed)
He can use voltaren gel. He needs to see ortho or neurosurgery as discussed

## 2020-01-25 NOTE — Telephone Encounter (Signed)
Patient was prescribed Prednisone on 01/16/20 for 5 days.  His pain was much better while taking but since he has completed the pain has returned.  He would like to know if you will send in another round of Prednisone or if there is something else you recommend instead.  [employer].

## 2020-02-05 ENCOUNTER — Ambulatory Visit (INDEPENDENT_AMBULATORY_CARE_PROVIDER_SITE_OTHER): Payer: 59 | Admitting: Family Medicine

## 2020-02-05 ENCOUNTER — Encounter: Payer: Self-pay | Admitting: Family Medicine

## 2020-02-05 ENCOUNTER — Telehealth: Payer: Self-pay | Admitting: Family Medicine

## 2020-02-05 DIAGNOSIS — M4716 Other spondylosis with myelopathy, lumbar region: Secondary | ICD-10-CM | POA: Diagnosis not present

## 2020-02-05 NOTE — Progress Notes (Signed)
Subjective:    Patient ID: Charles Santos, male    DOB: [birth year], 49 y.o.   MRN: [id number]   HPI: Charles Santos is a 49 y.o. male presenting for need for TENS unit. He can't get to therapy again until after he has ESI next week. He got a lot of relief from a TENS that they used at PT. He would like to use one at home since he can't go to therapy. Needs a prescription. Bones in the spine pressing on and pinching the sciatic nerve. Took a med for 5 days from Ms. Rakes. PAin delayed to the end of the day. Has had an MRI showing the bones pinching. Report reviewed showing spondylosis and congenitally narrowed canal. Pain radiated down left leg feels cold and numb. Right leg feels like the muscle is being ripped of the bone. [employer] [phone], phone [phone].   Depression screen [facility] 2/9 12/05/2019 09/26/2019 06/25/2019 05/28/2019 12/16/2018  Decreased Interest 0 0 0 0 0  Down, Depressed, Hopeless 0 0 0 0 0  PHQ - 2 Score 0 0 0 0 0  Altered sleeping - - - - -  Tired, decreased energy - - - - -  Change in appetite - - - - -  Feeling bad or failure about yourself  - - - - -  Trouble concentrating - - - - -  Moving slowly or fidgety/restless - - - - -  Suicidal thoughts - - - - -  PHQ-9 Score - - - - -  Difficult doing work/chores - - - - -     Relevant past medical, surgical, family and social history reviewed and updated as indicated.  Interim medical history since our last visit reviewed. Allergies and medications reviewed and updated.  ROS:  Review of Systems  Musculoskeletal: Positive for arthralgias, back pain and myalgias.     Social History   Tobacco Use  Smoking Status Current Every Day Smoker  . Packs/day: 1.00  . Years: 23.00  . Pack years: 23.00  . Types: Cigarettes  Smokeless Tobacco Never Used       Objective:     Wt Readings from Last 3 Encounters:  12/05/19 208 lb (94.3 kg)  09/28/19 200 lb (90.7 kg)  09/26/19 204 lb (92.5 kg)     Exam  deferred. Pt. Harboring due to COVID 19. Phone visit performed.   Assessment & Plan:   1. Lumbar spondylosis with myelopathy     No orders of the defined types were placed in this encounter.   Orders Placed This Encounter  Procedures  . For home use only DME Other see comment    TENS unit and pads DX:M47.16    Order Specific Question:   Length of Need    Answer:   Lifetime      Diagnoses and all orders for this visit:  Lumbar spondylosis with myelopathy -     For home use only DME Other see comment    Virtual Visit via telephone Note  I discussed the limitations, risks, security and privacy concerns of performing an evaluation and management service by telephone and the availability of in person appointments. The patient was identified with two identifiers. Pt.expressed understanding and agreed to proceed. Pt. Is at home. Dr. Livia Snellen is in his office.  Follow Up Instructions:   I discussed the assessment and treatment plan with the patient. The patient was provided an opportunity to ask questions and all were answered. The patient agreed  with the plan and demonstrated an understanding of the instructions.   The patient was advised to call back or seek an in-person evaluation if the symptoms worsen or if the condition fails to improve as anticipated.   Total minutes including chart review and phone contact time: 18   Follow up plan: Return if symptoms worsen or fail to improve.  Claretta Fraise, MD [place]

## 2020-02-12 ENCOUNTER — Other Ambulatory Visit: Payer: Self-pay | Admitting: Family Medicine

## 2020-02-12 DIAGNOSIS — E781 Pure hyperglyceridemia: Secondary | ICD-10-CM

## 2020-02-26 ENCOUNTER — Encounter: Payer: Self-pay | Admitting: *Deleted

## 2020-02-27 ENCOUNTER — Telehealth: Payer: Self-pay | Admitting: Family Medicine

## 2020-02-27 NOTE — Telephone Encounter (Signed)
He can get vaccine, he needs to schedule an appointment at a site where the vaccines are offered. Pt needs to follow up with neurosurgeon about continued back pain.

## 2020-02-27 NOTE — Telephone Encounter (Signed)
Patient states neurosurgeon back injection has not helped was given 03/01. Patient uses tens unit and heating pad. Patient advised about COVID vaccine.

## 2020-02-27 NOTE — Telephone Encounter (Signed)
Patient aware and verbalized understanding. °

## 2020-03-07 ENCOUNTER — Other Ambulatory Visit: Payer: Self-pay | Admitting: Family Medicine

## 2020-03-07 DIAGNOSIS — M4306 Spondylolysis, lumbar region: Secondary | ICD-10-CM

## 2020-03-07 DIAGNOSIS — M5417 Radiculopathy, lumbosacral region: Secondary | ICD-10-CM

## 2020-03-07 DIAGNOSIS — G8929 Other chronic pain: Secondary | ICD-10-CM

## 2020-03-07 DIAGNOSIS — M48061 Spinal stenosis, lumbar region without neurogenic claudication: Secondary | ICD-10-CM

## 2020-03-12 ENCOUNTER — Other Ambulatory Visit: Payer: Self-pay | Admitting: Family Medicine

## 2020-03-12 DIAGNOSIS — I10 Essential (primary) hypertension: Secondary | ICD-10-CM

## 2020-03-20 ENCOUNTER — Ambulatory Visit: Payer: 59 | Admitting: Family Medicine

## 2020-03-21 ENCOUNTER — Ambulatory Visit (INDEPENDENT_AMBULATORY_CARE_PROVIDER_SITE_OTHER): Payer: 59 | Admitting: Family Medicine

## 2020-03-21 ENCOUNTER — Other Ambulatory Visit: Payer: Self-pay

## 2020-03-21 ENCOUNTER — Encounter: Payer: Self-pay | Admitting: Family Medicine

## 2020-03-21 VITALS — BP 129/77 | HR 91 | Temp 99.1°F | Ht 70.0 in | Wt 210.4 lb

## 2020-03-21 DIAGNOSIS — E782 Mixed hyperlipidemia: Secondary | ICD-10-CM | POA: Diagnosis not present

## 2020-03-21 DIAGNOSIS — Z85038 Personal history of other malignant neoplasm of large intestine: Secondary | ICD-10-CM | POA: Diagnosis not present

## 2020-03-21 DIAGNOSIS — F419 Anxiety disorder, unspecified: Secondary | ICD-10-CM

## 2020-03-21 DIAGNOSIS — I10 Essential (primary) hypertension: Secondary | ICD-10-CM | POA: Diagnosis not present

## 2020-03-21 DIAGNOSIS — E559 Vitamin D deficiency, unspecified: Secondary | ICD-10-CM

## 2020-03-21 MED ORDER — CHLORTHALIDONE 25 MG PO TABS: 25.0000 mg | ORAL_TABLET | Freq: Every day | ORAL | 3 refills | Status: DC

## 2020-03-21 MED ORDER — GEMFIBROZIL 600 MG PO TABS: ORAL_TABLET | ORAL | 11 refills | Status: DC

## 2020-03-21 MED ORDER — QUETIAPINE FUMARATE 100 MG PO TABS: 100.0000 mg | ORAL_TABLET | Freq: Every day | ORAL | 3 refills | Status: DC

## 2020-03-21 MED ORDER — ATORVASTATIN CALCIUM 40 MG PO TABS: 40.0000 mg | ORAL_TABLET | Freq: Every day | ORAL | 3 refills | Status: DC

## 2020-03-21 NOTE — Progress Notes (Signed)
Subjective:  Patient ID: Charles Santos, male    DOB: 07/18/1971, 49 y.o.   MRN: [id number]  Patient Care Team: Claretta Fraise, MD as PCP - General (Family Medicine)   Chief Complaint:  Medical Management of Chronic Issues, Hypertension, and Hyperlipidemia   HPI: Charles Santos is a 49 y.o. male presenting on 03/21/2020 for Medical Management of Chronic Issues, Hypertension, and Hyperlipidemia   1. Essential hypertension Doing well on current medications. No headache, chest pain, weakness, confusion, palpitations, leg swelling, dizziness, or weakness.   2. Mixed hyperlipidemia Has not been following a strict diet or exercise regimen. Has been taking medications as prescribed without associated side effects.   3. History of malignant neoplasm of colon without staging Has [name]. Follows with GI for colonoscopy every year and upper endoscopy every 2 years. Due for colonoscopy this October. No melena, hematochezia, or weight loss. No hemoptysis of dysphagia.   4. Anxiety Doing well on current medication regimen.   GAD 7 : Generalized Anxiety Score 03/21/2020 11/23/2018 07/02/2016 06/01/2016  Nervous, Anxious, on Edge 0 0 0 0  Control/stop worrying 0 0 0 3  Worry too much - different things 0 0 0 3  Trouble relaxing 0 1 1 3   Restless 0 0 1 0  Easily annoyed or irritable 0 1 0 0  Afraid - awful might happen 0 0 0 1  Total GAD 7 Score 0 2 2 10   Anxiety Difficulty - Not difficult at all Not difficult at all Somewhat difficult    Depression screen [facility] 2/9 03/21/2020 12/05/2019 09/26/2019 06/25/2019 05/28/2019  Decreased Interest 0 0 0 0 0  Down, Depressed, Hopeless 0 0 0 0 0  PHQ - 2 Score 0 0 0 0 0  Altered sleeping - - - - -  Tired, decreased energy - - - - -  Change in appetite - - - - -  Feeling bad or failure about yourself  - - - - -  Trouble concentrating - - - - -  Moving slowly or fidgety/restless - - - - -  Suicidal thoughts - - - - -  PHQ-9 Score - - - - -    Difficult doing work/chores - - - - -     5. Vitamin D deficiency Has not been taking repletion therapy. No recent fractures. Does have chronic lower back pain. No new or worsening symptoms.    Relevant past medical, surgical, family, and social history reviewed and updated as indicated.  Allergies and medications reviewed and updated. Date reviewed: Chart in Epic.   Past Medical History:  Diagnosis Date  . Anxiety   . Cancer of transverse colon ([place]) 09/17/2016   surgery done  . Colon cancer [facility]) 1998   surgery and chemo  . Complication of anesthesia    not fully under for polyp removal at [place] 2017  . Depression   . Emphysema of lung ([place])   . Family hx of colon cancer   . GERD (gastroesophageal reflux disease)    none recent  . Headache   . Hyperlipidemia   . Hypertension   . Vitamin D deficiency     Past Surgical History:  Procedure Laterality Date  . APPENDECTOMY    . colon resectomy  1998   2 degree herida  . COLONOSCOPY    . ESOPHAGOGASTRODUODENOSCOPY (EGD) WITH PROPOFOL N/A 05/19/2017   Procedure: ESOPHAGOGASTRODUODENOSCOPY (EGD) WITH PROPOFOL;  Surgeon: [name], MD;  Location: [facility];  Service: Endoscopy;  Laterality: N/A;  . FLEXIBLE SIGMOIDOSCOPY N/A 05/19/2017   Procedure: FLEXIBLE SIGMOIDOSCOPY;  Surgeon: [name], MD;  Location: [facility];  Service: Endoscopy;  Laterality: N/A;  . INGUINAL HERNIA REPAIR    . LAPAROSCOPIC SUBTOTAL COLECTOMY N/A 09/17/2016   Procedure: DIAGNOSTIC LAPAROSCOPY EXPLORATORY LAPAROTOMY SUBTOTAL COLECTOMY PROCTOSCOPY;  Surgeon: [name], MD;  Location: [facility];  Service: General;  Laterality: N/A;  . polyp removal at [place]  2017  . PROCTOSCOPY  09/17/2016   Procedure: PROCTOSCOPY;  Surgeon: [name], MD;  Location: [facility];  Service: General;;  . UPPER GI ENDOSCOPY      Social History   Socioeconomic History  . Marital status: Legally Separated    Spouse name: Not on file  .  Number of children: Not on file  . Years of education: Not on file  . Highest education level: Not on file  Occupational History  . Not on file  Tobacco Use  . Smoking status: Current Every Day Smoker    Packs/day: 1.00    Years: 23.00    Pack years: 23.00    Types: Cigarettes  . Smokeless tobacco: Never Used  Substance and Sexual Activity  . Alcohol use: Yes    Alcohol/week: 12.0 standard drinks    Types: 12 Cans of beer per week    Comment: occ  . Drug use: No  . Sexual activity: Not on file  Other Topics Concern  . Not on file  Social History Narrative  . Not on file   Social Determinants of Health   Financial Resource Strain:   . Difficulty of Paying Living Expenses:   Food Insecurity:   . Worried About Charity fundraiser in the Last Year:   . Arboriculturist in the Last Year:   Transportation Needs:   . Film/video editor (Medical):   Marland Kitchen Lack of Transportation (Non-Medical):   Physical Activity:   . Days of Exercise per Week:   . Minutes of Exercise per Session:   Stress:   . Feeling of Stress :   Social Connections:   . Frequency of Communication with Friends and Family:   . Frequency of Social Gatherings with Friends and Family:   . Attends Religious Services:   . Active Member of Clubs or Organizations:   . Attends Archivist Meetings:   Marland Kitchen Marital Status:   Intimate Partner Violence:   . Fear of Current or Ex-Partner:   . Emotionally Abused:   Marland Kitchen Physically Abused:   . Sexually Abused:     Outpatient Encounter Medications as of 03/21/2020  Medication Sig  . atorvastatin (LIPITOR) 40 MG tablet Take 1 tablet (40 mg total) by mouth daily.  . chlorthalidone (HYGROTON) 25 MG tablet Take 1 tablet (25 mg total) by mouth daily.  Marland Kitchen gemfibrozil (LOPID) 600 MG tablet TAKE  (1)  TABLET TWICE A DAY WITH MEALS (BREAKFAST AND SUPPER)  . naproxen (NAPROSYN) 500 MG tablet TAKE 1 TABLET BY MOUTH 2 TIMES DAILY WITH A MEAL.  Marland Kitchen Omega-3 Fatty Acids (FISH OIL)  1000 MG CPDR Take 2 capsules by mouth daily.  . Potassium 99 MG TABS Take by mouth. One a day  . QUEtiapine (SEROQUEL) 100 MG tablet Take 1 tablet (100 mg total) by mouth at bedtime.  . [DISCONTINUED] atorvastatin (LIPITOR) 40 MG tablet Take 1 tablet (40 mg total) by mouth daily.  . [DISCONTINUED] chlorthalidone (HYGROTON) 25 MG tablet TAKE 1 TABLET ONCE DAILY  . [  DISCONTINUED] gemfibrozil (LOPID) 600 MG tablet TAKE  (1)  TABLET TWICE A DAY WITH MEALS (BREAKFAST AND SUPPER)  . [DISCONTINUED] nicotine (NICODERM CQ - DOSED IN MG/24 HOURS) 21 mg/24hr patch Place 1 patch (21 mg total) onto the skin daily.  . [DISCONTINUED] predniSONE (DELTASONE) 20 MG tablet 2 po at sametime daily for 5 days  . [DISCONTINUED] QUEtiapine (SEROQUEL) 100 MG tablet Take 1 tablet (100 mg total) by mouth at bedtime.   No facility-administered encounter medications on file as of 03/21/2020.    Allergies  Allergen Reactions  . Adhesive [Tape] Rash    Prefers paper tape    Review of Systems  Constitutional: Negative for activity change, appetite change, chills, diaphoresis, fatigue, fever and unexpected weight change.  HENT: Negative.   Eyes: Negative.  Negative for photophobia and visual disturbance.  Respiratory: Negative for cough, chest tightness and shortness of breath.   Cardiovascular: Negative for chest pain, palpitations and leg swelling.  Gastrointestinal: Positive for diarrhea (baseline). Negative for abdominal pain, blood in stool, constipation, nausea and vomiting.  Endocrine: Negative.  Negative for cold intolerance, heat intolerance, polydipsia, polyphagia and polyuria.  Genitourinary: Negative for decreased urine volume, difficulty urinating, dysuria, frequency, hematuria and urgency.  Musculoskeletal: Positive for arthralgias, back pain and myalgias. Negative for gait problem, joint swelling, neck pain and neck stiffness.  Skin: Negative.   Allergic/Immunologic: Negative.   Neurological: Negative for  dizziness, tremors, seizures, syncope, facial asymmetry, speech difficulty, weakness, light-headedness, numbness and headaches.  Hematological: Negative.   Psychiatric/Behavioral: Negative for confusion, hallucinations, sleep disturbance and suicidal ideas.  All other systems reviewed and are negative.       Objective:  BP 129/77   Pulse 91   Temp 99.1 F (37.3 C)   Ht 5' 10"  (1.778 m)   Wt 210 lb 6.4 oz (95.4 kg)   SpO2 98%   BMI 30.19 kg/m    Wt Readings from Last 3 Encounters:  03/21/20 210 lb 6.4 oz (95.4 kg)  12/05/19 208 lb (94.3 kg)  09/28/19 200 lb (90.7 kg)    Physical Exam Vitals and nursing note reviewed.  Constitutional:      General: He is not in acute distress.    Appearance: Normal appearance. He is well-developed and well-groomed. He is obese. He is not ill-appearing, toxic-appearing or diaphoretic.  HENT:     Head: Normocephalic and atraumatic.     Jaw: There is normal jaw occlusion.     Right Ear: Hearing normal.     Left Ear: Hearing normal.     Nose: Nose normal.     Mouth/Throat:     Lips: Pink.     Mouth: Mucous membranes are moist.     Pharynx: Oropharynx is clear. Uvula midline.  Eyes:     General: Lids are normal.     Extraocular Movements: Extraocular movements intact.     Conjunctiva/sclera: Conjunctivae normal.     Pupils: Pupils are equal, round, and reactive to light.  Neck:     Thyroid: No thyroid mass, thyromegaly or thyroid tenderness.     Vascular: No carotid bruit or JVD.     Trachea: Trachea and phonation normal.  Cardiovascular:     Rate and Rhythm: Normal rate and regular rhythm.     Chest Wall: PMI is not displaced.     Pulses: Normal pulses.     Heart sounds: Normal heart sounds. No murmur. No friction rub. No gallop.   Pulmonary:     Effort: Pulmonary effort  is normal. No respiratory distress.     Breath sounds: Normal breath sounds. No wheezing.  Abdominal:     General: Bowel sounds are normal. There is no distension  or abdominal bruit.     Palpations: Abdomen is soft. There is no hepatomegaly or splenomegaly.     Tenderness: There is no abdominal tenderness. There is no right CVA tenderness or left CVA tenderness.     Hernia: No hernia is present.  Musculoskeletal:     Cervical back: Normal range of motion and neck supple.     Thoracic back: Normal.     Lumbar back: Tenderness present. Decreased range of motion.     Right hip: Normal.     Left hip: Normal.     Right lower leg: No edema.     Left lower leg: No edema.  Lymphadenopathy:     Cervical: No cervical adenopathy.  Skin:    General: Skin is warm and dry.     Capillary Refill: Capillary refill takes less than 2 seconds.     Coloration: Skin is not cyanotic, jaundiced or pale.     Findings: No rash.  Neurological:     General: No focal deficit present.     Mental Status: He is alert and oriented to person, place, and time.     Cranial Nerves: Cranial nerves are intact. No cranial nerve deficit.     Sensory: Sensation is intact. No sensory deficit.     Motor: Motor function is intact. No weakness.     Coordination: Coordination is intact. Coordination normal.     Gait: Gait is intact. Gait normal.     Deep Tendon Reflexes: Reflexes are normal and symmetric. Reflexes normal.  Psychiatric:        Attention and Perception: Attention and perception normal.        Mood and Affect: Mood and affect normal.        Speech: Speech normal.        Behavior: Behavior normal. Behavior is cooperative.        Thought Content: Thought content normal.        Cognition and Memory: Cognition and memory normal.        Judgment: Judgment normal.     Results for orders placed or performed in visit on 09/26/19  CBC with Differential/Platelet  Result Value Ref Range   WBC 7.0 3.4 - 10.8 x10E3/uL   RBC 5.26 4.14 - 5.80 x10E6/uL   Hemoglobin 18.6 (H) 13.0 - 17.7 g/dL   Hematocrit 51.2 (H) 37.5 - 51.0 %   MCV 97 79 - 97 fL   MCH 35.4 (H) 26.6 - 33.0 pg    MCHC 36.3 (H) 31.5 - 35.7 g/dL   RDW 13.2 11.6 - 15.4 %   Platelets 200 150 - 450 x10E3/uL   Neutrophils 62 Not Estab. %   Lymphs 22 Not Estab. %   Monocytes 11 Not Estab. %   Eos 4 Not Estab. %   Basos 1 Not Estab. %   Neutrophils Absolute 4.3 1.4 - 7.0 x10E3/uL   Lymphocytes Absolute 1.5 0.7 - 3.1 x10E3/uL   Monocytes Absolute 0.8 0.1 - 0.9 x10E3/uL   EOS (ABSOLUTE) 0.3 0.0 - 0.4 x10E3/uL   Basophils Absolute 0.1 0.0 - 0.2 x10E3/uL   Immature Granulocytes 0 Not Estab. %   Immature Grans (Abs) 0.0 0.0 - 0.1 x10E3/uL  CMP14+EGFR  Result Value Ref Range   Glucose 97 65 - 99 mg/dL   BUN 13  6 - 24 mg/dL   Creatinine, Ser 1.08 0.76 - 1.27 mg/dL   GFR calc non Af Amer 81 >59 mL/min/1.73   GFR calc Af Amer 94 >59 mL/min/1.73   BUN/Creatinine Ratio 12 9 - 20   Sodium 138 134 - 144 mmol/L   Potassium 3.1 (L) 3.5 - 5.2 mmol/L   Chloride 97 96 - 106 mmol/L   CO2 25 20 - 29 mmol/L   Calcium 9.3 8.7 - 10.2 mg/dL   Total Protein 6.9 6.0 - 8.5 g/dL   Albumin 4.4 4.0 - 5.0 g/dL   Globulin, Total 2.5 1.5 - 4.5 g/dL   Albumin/Globulin Ratio 1.8 1.2 - 2.2   Bilirubin Total 1.0 0.0 - 1.2 mg/dL   Alkaline Phosphatase 71 39 - 117 IU/L   AST 58 (H) 0 - 40 IU/L   ALT 41 0 - 44 IU/L  Lipid panel  Result Value Ref Range   Cholesterol, Total 146 100 - 199 mg/dL   Triglycerides 409 (H) 0 - 149 mg/dL   HDL 48 >39 mg/dL   VLDL Cholesterol Cal 59 (H) 5 - 40 mg/dL   LDL Chol Calc (NIH) 39 0 - 99 mg/dL   Chol/HDL Ratio 3.0 0.0 - 5.0 ratio       Pertinent labs & imaging results that were available during my care of the patient were reviewed by me and considered in my medical decision making.  Assessment & Plan:  Iliya was seen today for medical management of chronic issues, hypertension and hyperlipidemia.  Diagnoses and all orders for this visit:  Essential hypertension BP well controlled. Changes were not made in regimen today. Goal BP is 130/80. Pt aware to report any persistent high or low  readings. DASH diet and exercise encouraged. Exercise at least 150 minutes per week and increase as tolerated. Goal BMI > 25. Stress management encouraged. Avoid nicotine and tobacco product use. Avoid excessive alcohol and NSAID's. Avoid more than 2000 mg of sodium daily. Medications as prescribed. Follow up as scheduled.  -     CMP14+EGFR -     CBC with Differential/Platelet -     Lipid panel -     chlorthalidone (HYGROTON) 25 MG tablet; Take 1 tablet (25 mg total) by mouth daily.  Mixed hyperlipidemia Diet encouraged - increase intake of fresh fruits and vegetables, increase intake of lean proteins. Bake, broil, or grill foods. Avoid fried, greasy, and fatty foods. Avoid fast foods. Increase intake of fiber-rich whole grains. Exercise encouraged - at least 150 minutes per week and advance as tolerated.  Goal BMI < 25. Continue medications as prescribed. Follow up in 3-6 months as discussed.  -     CMP14+EGFR -     Lipid panel -     atorvastatin (LIPITOR) 40 MG tablet; Take 1 tablet (40 mg total) by mouth daily. -     gemfibrozil (LOPID) 600 MG tablet; TAKE  (1)  TABLET TWICE A DAY WITH MEALS (BREAKFAST AND SUPPER)  History of malignant neoplasm of colon without staging Followed by GI on a close basis. Continue with yearly colonoscopy.   Anxiety Doing well on below. Will continue.  -     QUEtiapine (SEROQUEL) 100 MG tablet; Take 1 tablet (100 mg total) by mouth at bedtime.  Vitamin D deficiency Labs pending. If indicated, will start repletion therapy. Eat foods rich in Vit D including milk, orange juice, yogurt with vitamin D added, salmon or mackerel, canned tuna fish, cereals with vitamin D added, and  cod liver oil. Get out in the sun but make sure to wear at least SPF 30 sunscreen.  -     VITAMIN D 25 Hydroxy (Vit-D Deficiency, Fractures)     Continue all other maintenance medications.  Follow up plan: Return in about 6 months (around 09/20/2020), or if symptoms worsen or fail to  improve, for HTN, Lipids.   Continue healthy lifestyle choices, including diet (rich in fruits, vegetables, and lean proteins, and low in salt and simple carbohydrates) and exercise (at least 30 minutes of moderate physical activity daily).  Educational handout given for health maintenance  The above assessment and management plan was discussed with the patient. The patient verbalized understanding of and has agreed to the management plan. Patient is aware to call the clinic if they develop any new symptoms or if symptoms persist or worsen. Patient is aware when to return to the clinic for a follow-up visit. Patient educated on when it is appropriate to go to the emergency department.   Monia Pouch, FNP-C [facility] [phone]

## 2020-03-21 NOTE — Patient Instructions (Addendum)

## 2020-03-22 LAB — CBC WITH DIFFERENTIAL/PLATELET
Basophils Absolute: 0.1 10*3/uL (ref 0.0–0.2)
Basos: 1 %
EOS (ABSOLUTE): 0.1 10*3/uL (ref 0.0–0.4)
Eos: 1 %
Hematocrit: 48.3 % (ref 37.5–51.0)
Hemoglobin: 17.7 g/dL (ref 13.0–17.7)
Immature Grans (Abs): 0.1 10*3/uL (ref 0.0–0.1)
Immature Granulocytes: 1 %
Lymphocytes Absolute: 1.7 10*3/uL (ref 0.7–3.1)
Lymphs: 21 %
MCH: 36.6 pg — ABNORMAL HIGH (ref 26.6–33.0)
MCHC: 36.6 g/dL — ABNORMAL HIGH (ref 31.5–35.7)
MCV: 100 fL — ABNORMAL HIGH (ref 79–97)
Monocytes Absolute: 0.9 10*3/uL (ref 0.1–0.9)
Monocytes: 11 %
Neutrophils Absolute: 5.3 10*3/uL (ref 1.4–7.0)
Neutrophils: 65 %
Platelets: 218 10*3/uL (ref 150–450)
RBC: 4.84 x10E6/uL (ref 4.14–5.80)
RDW: 13 % (ref 11.6–15.4)
WBC: 8.2 10*3/uL (ref 3.4–10.8)

## 2020-03-22 LAB — CMP14+EGFR
ALT: 41 IU/L (ref 0–44)
AST: 45 IU/L — ABNORMAL HIGH (ref 0–40)
Albumin/Globulin Ratio: 2 (ref 1.2–2.2)
Albumin: 4.4 g/dL (ref 4.0–5.0)
Alkaline Phosphatase: 68 IU/L (ref 39–117)
BUN/Creatinine Ratio: 17 (ref 9–20)
BUN: 16 mg/dL (ref 6–24)
Bilirubin Total: 0.5 mg/dL (ref 0.0–1.2)
CO2: 28 mmol/L (ref 20–29)
Calcium: 9.1 mg/dL (ref 8.7–10.2)
Chloride: 96 mmol/L (ref 96–106)
Creatinine, Ser: 0.94 mg/dL (ref 0.76–1.27)
GFR calc Af Amer: 111 mL/min/{1.73_m2} (ref >59)
GFR calc non Af Amer: 96 mL/min/{1.73_m2} (ref >59)
Globulin, Total: 2.2 g/dL (ref 1.5–4.5)
Glucose: 89 mg/dL (ref 65–99)
Potassium: 2.9 mmol/L — ABNORMAL LOW (ref 3.5–5.2)
Sodium: 141 mmol/L (ref 134–144)
Total Protein: 6.6 g/dL (ref 6.0–8.5)

## 2020-03-22 LAB — LIPID PANEL
Chol/HDL Ratio: 2.4 ratio (ref 0.0–5.0)
Cholesterol, Total: 161 mg/dL (ref 100–199)
HDL: 66 mg/dL (ref >39)
LDL Chol Calc (NIH): 48 mg/dL (ref 0–99)
Triglycerides: 316 mg/dL — ABNORMAL HIGH (ref 0–149)
VLDL Cholesterol Cal: 47 mg/dL — ABNORMAL HIGH (ref 5–40)

## 2020-03-22 LAB — VITAMIN D 25 HYDROXY (VIT D DEFICIENCY, FRACTURES): Vit D, 25-Hydroxy: 16.8 ng/mL — ABNORMAL LOW (ref 30.0–100.0)

## 2020-03-26 ENCOUNTER — Other Ambulatory Visit: Payer: Self-pay | Admitting: *Deleted

## 2020-03-26 ENCOUNTER — Ambulatory Visit: Payer: Managed Care, Other (non HMO) | Admitting: Family Medicine

## 2020-03-26 DIAGNOSIS — E876 Hypokalemia: Secondary | ICD-10-CM

## 2020-03-26 MED ORDER — VITAMIN D (ERGOCALCIFEROL) 1.25 MG (50000 UNIT) PO CAPS: 50000.0000 [IU] | ORAL_CAPSULE | ORAL | 11 refills | Status: DC

## 2020-03-26 NOTE — Addendum Note (Signed)
Addended by: Baruch Gouty on: 03/26/2020 10:37 AM   Modules accepted: Orders

## 2020-03-26 NOTE — Progress Notes (Signed)
Potassium is low at 2.9, please continue potassium repletion and recheck this in 1 week to see if this has returned to normal.  Glucose normal.  AST still elevated but has declined. Make sure to limit intake of tylenol and alcohol.  MCH, MCV, and MCHC are slightly elevated as they have been. Will continue to monitor. RBC, Hgb, and Hct are normal.  Triglycerides continue to improve but remain elevated. Continue to work on diet and take medications as prescribed.  Vit D very low again. Will refill Vit D weekly. Get out in the sun as much as you can. Eat foods rich in Vit D.

## 2020-04-04 ENCOUNTER — Other Ambulatory Visit: Payer: Self-pay

## 2020-04-04 ENCOUNTER — Other Ambulatory Visit: Payer: 59

## 2020-04-04 DIAGNOSIS — E876 Hypokalemia: Secondary | ICD-10-CM

## 2020-04-05 LAB — CMP14+EGFR
ALT: 42 IU/L (ref 0–44)
AST: 47 IU/L — ABNORMAL HIGH (ref 0–40)
Albumin/Globulin Ratio: 1.7 (ref 1.2–2.2)
Albumin: 4.3 g/dL (ref 4.0–5.0)
Alkaline Phosphatase: 79 IU/L (ref 39–117)
BUN/Creatinine Ratio: 18 (ref 9–20)
BUN: 17 mg/dL (ref 6–24)
Bilirubin Total: 0.5 mg/dL (ref 0.0–1.2)
CO2: 28 mmol/L (ref 20–29)
Calcium: 9.1 mg/dL (ref 8.7–10.2)
Chloride: 93 mmol/L — ABNORMAL LOW (ref 96–106)
Creatinine, Ser: 0.94 mg/dL (ref 0.76–1.27)
GFR calc Af Amer: 111 mL/min/{1.73_m2} (ref >59)
GFR calc non Af Amer: 96 mL/min/{1.73_m2} (ref >59)
Globulin, Total: 2.6 g/dL (ref 1.5–4.5)
Glucose: 124 mg/dL — ABNORMAL HIGH (ref 65–99)
Potassium: 2.8 mmol/L — ABNORMAL LOW (ref 3.5–5.2)
Sodium: 140 mmol/L (ref 134–144)
Total Protein: 6.9 g/dL (ref 6.0–8.5)

## 2020-04-08 ENCOUNTER — Other Ambulatory Visit: Payer: Self-pay | Admitting: *Deleted

## 2020-04-08 ENCOUNTER — Other Ambulatory Visit: Payer: Self-pay | Admitting: Family Medicine

## 2020-04-08 DIAGNOSIS — Z8639 Personal history of other endocrine, nutritional and metabolic disease: Secondary | ICD-10-CM

## 2020-04-08 MED ORDER — POTASSIUM CHLORIDE CRYS ER 20 MEQ PO TBCR: 20.0000 meq | EXTENDED_RELEASE_TABLET | Freq: Every day | ORAL | 2 refills | Status: DC

## 2020-04-09 ENCOUNTER — Telehealth: Payer: Self-pay | Admitting: Family Medicine

## 2020-04-09 NOTE — Telephone Encounter (Signed)
Patient aware and verbalized understanding. Picked up K+ and will come back to get redrawn

## 2020-04-23 ENCOUNTER — Other Ambulatory Visit: Payer: Self-pay

## 2020-04-23 ENCOUNTER — Other Ambulatory Visit: Payer: 59

## 2020-04-23 DIAGNOSIS — Z8639 Personal history of other endocrine, nutritional and metabolic disease: Secondary | ICD-10-CM

## 2020-04-24 LAB — CMP14+EGFR
ALT: 37 IU/L (ref 0–44)
AST: 46 IU/L — ABNORMAL HIGH (ref 0–40)
Albumin/Globulin Ratio: 1.8 (ref 1.2–2.2)
Albumin: 4.1 g/dL (ref 4.0–5.0)
Alkaline Phosphatase: 66 IU/L (ref 39–117)
BUN/Creatinine Ratio: 21 — ABNORMAL HIGH (ref 9–20)
BUN: 19 mg/dL (ref 6–24)
Bilirubin Total: 0.6 mg/dL (ref 0.0–1.2)
CO2: 28 mmol/L (ref 20–29)
Calcium: 10.1 mg/dL (ref 8.7–10.2)
Chloride: 92 mmol/L — ABNORMAL LOW (ref 96–106)
Creatinine, Ser: 0.89 mg/dL (ref 0.76–1.27)
GFR calc Af Amer: 118 mL/min/{1.73_m2} (ref >59)
GFR calc non Af Amer: 102 mL/min/{1.73_m2} (ref >59)
Globulin, Total: 2.3 g/dL (ref 1.5–4.5)
Glucose: 108 mg/dL — ABNORMAL HIGH (ref 65–99)
Potassium: 2.9 mmol/L — ABNORMAL LOW (ref 3.5–5.2)
Sodium: 138 mmol/L (ref 134–144)
Total Protein: 6.4 g/dL (ref 6.0–8.5)

## 2020-04-28 ENCOUNTER — Other Ambulatory Visit: Payer: Self-pay | Admitting: Family Medicine

## 2020-04-28 ENCOUNTER — Telehealth: Payer: Self-pay | Admitting: Family Medicine

## 2020-04-28 MED ORDER — POTASSIUM CHLORIDE CRYS ER 20 MEQ PO TBCR: 20.0000 meq | EXTENDED_RELEASE_TABLET | Freq: Two times a day (BID) | ORAL | 2 refills | Status: DC
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
## 2020-05-16 ENCOUNTER — Other Ambulatory Visit: Payer: Self-pay

## 2020-05-16 ENCOUNTER — Encounter: Payer: Self-pay | Admitting: Nurse Practitioner

## 2020-05-16 ENCOUNTER — Ambulatory Visit (INDEPENDENT_AMBULATORY_CARE_PROVIDER_SITE_OTHER): Payer: 59 | Admitting: Nurse Practitioner

## 2020-05-16 VITALS — BP 124/67 | HR 80 | Temp 97.4°F | Resp 20 | Ht 70.0 in | Wt 213.0 lb

## 2020-05-16 DIAGNOSIS — M5442 Lumbago with sciatica, left side: Secondary | ICD-10-CM | POA: Diagnosis not present

## 2020-05-16 DIAGNOSIS — M5441 Lumbago with sciatica, right side: Secondary | ICD-10-CM | POA: Diagnosis not present

## 2020-05-16 HISTORY — DX: Lumbago with sciatica, right side: M54.41

## 2020-05-16 HISTORY — DX: Lumbago with sciatica, left side: M54.42
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
## 2020-05-19 ENCOUNTER — Other Ambulatory Visit: Payer: Self-pay | Admitting: Family Medicine

## 2020-05-19 ENCOUNTER — Telehealth: Payer: Self-pay | Admitting: Family Medicine

## 2020-05-19 DIAGNOSIS — M4306 Spondylolysis, lumbar region: Secondary | ICD-10-CM

## 2020-05-19 DIAGNOSIS — M5441 Lumbago with sciatica, right side: Secondary | ICD-10-CM

## 2020-05-19 DIAGNOSIS — M48061 Spinal stenosis, lumbar region without neurogenic claudication: Secondary | ICD-10-CM

## 2020-05-19 DIAGNOSIS — M5417 Radiculopathy, lumbosacral region: Secondary | ICD-10-CM

## 2020-05-19 DIAGNOSIS — G8929 Other chronic pain: Secondary | ICD-10-CM

## 2020-05-19 MED ORDER — NAPROXEN 500 MG PO TABS: ORAL_TABLET | ORAL | 1 refills | Status: DC

## 2020-05-19 NOTE — Telephone Encounter (Signed)
Please let the patient know that I sent their prescription to their pharmacy. Thanks, WS 

## 2020-05-19 NOTE — Telephone Encounter (Signed)
Pt seen 05/16/2020 and not med given until pt found out about injection from neurosurgeon. Pt says that neurosurgeon will give shot in back until August. He wants provider to go ahead send rx for pain to [facility]. He was taking naproxen 500mg . Please call back

## 2020-05-19 NOTE — Telephone Encounter (Signed)
Left detailed message stating that Rx has been sent in and to contact this office with any questions or concerns

## 2020-05-30 ENCOUNTER — Telehealth: Payer: Self-pay | Admitting: *Deleted

## 2020-05-30 NOTE — Telephone Encounter (Signed)
Patient called in complaining of upper chest tenderness/soreness. States maybe a little swollen. Denies any heart or lung pain but is sore when coughs. States neurology started him on new med and symptoms started shortly after. He could not recall name of med.No appointments available here today for office or tele.  Advised patient to call the neurology office and ask if it could be a side effect of new med. To follow their recommendation. If no recommendations given, advised if symptoms worsen or new symptoms develop to go to urgent care or ER. If no change and still bothering him call us Monday morning for an appointment to be seen. Patient agreeable and happy with this plan.

## 2020-06-09 ENCOUNTER — Telehealth: Payer: Self-pay | Admitting: Family Medicine
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
## 2020-06-10 ENCOUNTER — Other Ambulatory Visit: Payer: Self-pay | Admitting: Nurse Practitioner

## 2020-06-10 NOTE — Telephone Encounter (Signed)
Pt was seen for this by JE 05/16/20. Can MRI be ordered?

## 2020-06-10 NOTE — Telephone Encounter (Signed)
Pt. Needs to be seen for this. Thanks, WS 

## 2020-06-11 NOTE — Telephone Encounter (Signed)
I would make him follow-up with PCP for management of any pain prescriptions and follow-up, I typically would not prescribe any controlled substances to somebody elses patient.  Have him follow-up with his PCP

## 2020-06-12 NOTE — Telephone Encounter (Signed)
LM for pt to call back and set up appt with PCP = [facility]   For back pain

## 2020-06-17 ENCOUNTER — Ambulatory Visit (INDEPENDENT_AMBULATORY_CARE_PROVIDER_SITE_OTHER): Payer: 59 | Admitting: Family Medicine

## 2020-06-17 ENCOUNTER — Other Ambulatory Visit: Payer: Self-pay

## 2020-06-17 ENCOUNTER — Encounter: Payer: Self-pay | Admitting: Family Medicine

## 2020-06-17 VITALS — BP 133/76 | HR 78 | Temp 98.6°F | Resp 20 | Ht 70.0 in | Wt 213.0 lb

## 2020-06-17 DIAGNOSIS — M5417 Radiculopathy, lumbosacral region: Secondary | ICD-10-CM

## 2020-06-17 MED ORDER — PREDNISONE 10 MG PO TABS: ORAL_TABLET | ORAL | 0 refills | Status: DC
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
## 2020-06-29 ENCOUNTER — Other Ambulatory Visit: Payer: Self-pay

## 2020-06-29 ENCOUNTER — Emergency Department (HOSPITAL_COMMUNITY): Payer: 59

## 2020-06-29 ENCOUNTER — Emergency Department (HOSPITAL_COMMUNITY)
Admission: EM | Admit: 2020-06-29 | Discharge: 2020-06-29 | Disposition: A | Discharge status: Home / Self Care | Payer: 59 | Attending: Emergency Medicine | Admitting: Emergency Medicine

## 2020-06-29 DIAGNOSIS — K625 Hemorrhage of anus and rectum: Secondary | ICD-10-CM | POA: Insufficient documentation

## 2020-06-29 DIAGNOSIS — Z85038 Personal history of other malignant neoplasm of large intestine: Secondary | ICD-10-CM | POA: Insufficient documentation

## 2020-06-29 DIAGNOSIS — F17210 Nicotine dependence, cigarettes, uncomplicated: Secondary | ICD-10-CM | POA: Diagnosis not present

## 2020-06-29 DIAGNOSIS — E876 Hypokalemia: Secondary | ICD-10-CM

## 2020-06-29 DIAGNOSIS — Z79899 Other long term (current) drug therapy: Secondary | ICD-10-CM | POA: Diagnosis not present

## 2020-06-29 DIAGNOSIS — I10 Essential (primary) hypertension: Secondary | ICD-10-CM | POA: Diagnosis not present

## 2020-06-29 LAB — COMPREHENSIVE METABOLIC PANEL
ALT: 41 U/L (ref 0–44)
AST: 41 U/L (ref 15–41)
Albumin: 3.6 g/dL (ref 3.5–5.0)
Alkaline Phosphatase: 45 U/L (ref 38–126)
Anion gap: 13 (ref 5–15)
BUN: 23 mg/dL — ABNORMAL HIGH (ref 6–20)
CO2: 27 mmol/L (ref 22–32)
Calcium: 8.6 mg/dL — ABNORMAL LOW (ref 8.9–10.3)
Chloride: 96 mmol/L — ABNORMAL LOW (ref 98–111)
Creatinine, Ser: 0.95 mg/dL (ref 0.61–1.24)
GFR calc Af Amer: >60 mL/min (ref >60)
GFR calc non Af Amer: >60 mL/min (ref >60)
Glucose, Bld: 104 mg/dL — ABNORMAL HIGH (ref 70–99)
Potassium: 2.7 mmol/L — CL (ref 3.5–5.1)
Sodium: 136 mmol/L (ref 135–145)
Total Bilirubin: 0.9 mg/dL (ref 0.3–1.2)
Total Protein: 6.5 g/dL (ref 6.5–8.1)

## 2020-06-29 LAB — CBC
HCT: 44.4 % (ref 39.0–52.0)
Hemoglobin: 16 g/dL (ref 13.0–17.0)
MCH: 36.3 pg — ABNORMAL HIGH (ref 26.0–34.0)
MCHC: 36 g/dL (ref 30.0–36.0)
MCV: 100.7 fL — ABNORMAL HIGH (ref 80.0–100.0)
Platelets: 181 10*3/uL (ref 150–400)
RBC: 4.41 MIL/uL (ref 4.22–5.81)
RDW: 12.9 % (ref 11.5–15.5)
WBC: 7.6 10*3/uL (ref 4.0–10.5)
nRBC: 0 % (ref 0.0–0.2)

## 2020-06-29 LAB — TYPE AND SCREEN
ABO/RH(D): A POS
Antibody Screen: NEGATIVE

## 2020-06-29 LAB — POC OCCULT BLOOD, ED: Fecal Occult Bld: POSITIVE — AB

## 2020-06-29 MED ORDER — IOHEXOL 300 MG/ML  SOLN
100.0000 mL | Freq: Once | INTRAMUSCULAR | Status: AC | PRN
  Administered 2020-06-29: 100 mL via INTRAVENOUS

## 2020-06-29 MED ORDER — POTASSIUM CHLORIDE CRYS ER 20 MEQ PO TBCR
40.0000 meq | EXTENDED_RELEASE_TABLET | Freq: Once | ORAL | Status: AC
  Administered 2020-06-29: 40 meq via ORAL
  Filled 2020-06-29: qty 2

## 2020-06-29 MED ORDER — POTASSIUM CHLORIDE IN NACL 20-0.9 MEQ/L-% IV SOLN
Freq: Once | INTRAVENOUS | Status: AC
  Filled 2020-06-29: qty 1000

## 2020-06-29 MED ORDER — SODIUM CHLORIDE (PF) 0.9 % IJ SOLN
INTRAMUSCULAR | Status: AC
  Filled 2020-06-29: qty 50

## 2020-06-29 NOTE — Discharge Instructions (Signed)
As discussed, with your bleeding and your history it is very portly follow-up with your gastroenterologist. Please call tomorrow for the next available appointment. You may continue to have episodes of rectal bleeding, but as long as you are not having bleeding when not defecating, and do not develop new, or concerning changes, such as chest pain, loss of consciousness, difficulty breathing, it is appropriate for you to follow-up tomorrow with your physician.  If you do develop new, or concerning changes, please be sure to return here for additional evaluation.

## 2020-06-29 NOTE — ED Notes (Signed)
Patient ambulatory to BR with no assistance needed for BM.

## 2020-06-29 NOTE — ED Provider Notes (Signed)
[place] DEPT Provider Note   CSN: [id number] Arrival date & time: 06/29/20  8119     History Chief Complaint  Patient presents with  . GI Bleeding    Charles Santos is a 49 y.o. male.  HPI    Patient presents after 2 days of rectal bleeding. Patient has a history of colon cancer, now status post surgeries, including hemicolectomy. Without obvious precipitant, yesterday the patient began having bright red blood per rectum.  There was initially small amounts of blood, but the patient subsequently had gross amounts of bleeding yesterday, and today as well. No abdominal pain, no syncope, no lightheadedness, no chest pain. No recent medication change, diet change, activity change. Patient notes that he has not had a colonoscopy in about 3 years, after his most recent episodes of colon cancer.  Past Medical History:  Diagnosis Date  . Anxiety   . Cancer of transverse colon ([place]) 09/17/2016   surgery done  . Colon cancer [facility]) 1998   surgery and chemo  . Complication of anesthesia    not fully under for polyp removal at [place] 2017  . Depression   . Emphysema of lung ([place])   . Family hx of colon cancer   . GERD (gastroesophageal reflux disease)    none recent  . Headache   . Hyperlipidemia   . Hypertension   . Vitamin D deficiency     Patient Active Problem List   Diagnosis Date Noted  . Acute midline low back pain with bilateral sciatica 05/16/2020  . Foraminal stenosis of lumbar region 12/28/2019  . Lumbar spondylolysis 12/28/2019  . Radiculopathy of lumbosacral region 12/05/2019  . Essential hypertension 09/26/2019  . Chronic right-sided low back pain with right-sided sciatica 09/26/2019  . Recurrent right inguinal hernia 06/26/2019  . Hypertriglyceridemia 11/24/2018  . Anxiety 02/16/2018  . Elevated CEA   . [name]   . Cancer of transverse colon ([place]) 09/17/2016  . S/P colonoscopic polypectomy   . Hematochezia   .  Depression 06/01/2016  . Alcohol abuse, in remission 06/01/2016  . Tobacco abuse 06/01/2016  . Vitamin D deficiency 10/17/2015  . Hyperlipidemia 10/17/2015  . History of malignant neoplasm of colon without staging 09/22/2010    Past Surgical History:  Procedure Laterality Date  . APPENDECTOMY    . colon resectomy  1998   2 degree herida  . COLONOSCOPY    . ESOPHAGOGASTRODUODENOSCOPY (EGD) WITH PROPOFOL N/A 05/19/2017   Procedure: ESOPHAGOGASTRODUODENOSCOPY (EGD) WITH PROPOFOL;  Surgeon: [name], MD;  Location: [facility] ENDOSCOPY;  Service: Endoscopy;  Laterality: N/A;  . FLEXIBLE SIGMOIDOSCOPY N/A 05/19/2017   Procedure: FLEXIBLE SIGMOIDOSCOPY;  Surgeon: [name], MD;  Location: [facility] ENDOSCOPY;  Service: Endoscopy;  Laterality: N/A;  . INGUINAL HERNIA REPAIR    . LAPAROSCOPIC SUBTOTAL COLECTOMY N/A 09/17/2016   Procedure: DIAGNOSTIC LAPAROSCOPY EXPLORATORY LAPAROTOMY SUBTOTAL COLECTOMY PROCTOSCOPY;  Surgeon: [name], MD;  Location: [facility] ORS;  Service: General;  Laterality: N/A;  . polyp removal at [place]  2017  . PROCTOSCOPY  09/17/2016   Procedure: PROCTOSCOPY;  Surgeon: [name], MD;  Location: [facility] ORS;  Service: General;;  . UPPER GI ENDOSCOPY         Family History  Problem Relation Age of Onset  . Colon cancer Father        pt thinks he was in his 78's when dx  . Esophageal cancer Neg Hx   . Rectal cancer Neg Hx   .  Stomach cancer Neg Hx   . Prostate cancer Neg Hx     Social History   Tobacco Use  . Smoking status: Current Every Day Smoker    Packs/day: 1.00    Years: 23.00    Pack years: 23.00    Types: Cigarettes  . Smokeless tobacco: Never Used  Vaping Use  . Vaping Use: Never used  Substance Use Topics  . Alcohol use: Yes    Alcohol/week: 12.0 standard drinks    Types: 12 Cans of beer per week    Comment: occ  . Drug use: No    Home Medications Prior to Admission medications   Medication Sig Start Date End Date Taking? Authorizing  Provider  atorvastatin (LIPITOR) 40 MG tablet Take 1 tablet (40 mg total) by mouth daily. 03/21/20  Yes Rakes, Connye Burkitt, FNP  chlorthalidone (HYGROTON) 25 MG tablet Take 1 tablet (25 mg total) by mouth daily. 03/21/20  Yes Rakes, Connye Burkitt, FNP  gemfibrozil (LOPID) 600 MG tablet TAKE  (1)  TABLET TWICE A DAY WITH MEALS (BREAKFAST AND SUPPER) Patient taking differently: Take 600 mg by mouth 2 (two) times daily before a meal. TAKE  (1)  TABLET TWICE A DAY WITH MEALS (BREAKFAST AND SUPPER) 03/21/20  Yes Rakes, Connye Burkitt, FNP  indomethacin (INDOCIN) 50 MG capsule Take 50 mg by mouth 3 (three) times daily. 05/21/20  Yes [provider]  naproxen (NAPROSYN) 500 MG tablet TAKE 1 TABLET BY MOUTH 2 TIMES DAILY WITH A MEAL. Patient taking differently: Take 500 mg by mouth 2 (two) times daily with a meal. TAKE 1 TABLET BY MOUTH 2 TIMES DAILY WITH A MEAL. 05/19/20  Yes Stacks, Cletus Gash, MD  Omega-3 Fatty Acids (FISH OIL) 1000 MG CPDR Take 2 capsules by mouth daily.   Yes [provider]  potassium chloride SA (KLOR-CON M20) 20 MEQ tablet Take 1 tablet (20 mEq total) by mouth 2 (two) times daily. 04/28/20  Yes Stacks, Cletus Gash, MD  predniSONE (DELTASONE) 10 MG tablet Take 5 daily for 3 days followed by 4,3,2 and 1 for 3 days each. Patient taking differently: Take 10-50 mg by mouth as directed. Take 50mg  daily for 3 days followed by 40mg  for 3 days, 30mg  for 3 days, 20mg  for 3 days and 10 for 3 days each. 06/17/20  Yes Claretta Fraise, MD  pregabalin (LYRICA) 75 MG capsule Take 75 mg by mouth in the morning and at bedtime. 06/25/20   [provider]    Allergies    Adhesive [tape]  Review of Systems   Review of Systems  Constitutional:       Per HPI, otherwise negative  HENT:       Per HPI, otherwise negative  Respiratory:       Per HPI, otherwise negative  Cardiovascular:       Per HPI, otherwise negative  Gastrointestinal: Negative for vomiting.  Endocrine:       Negative aside from HPI    Genitourinary:       Neg aside from HPI   Musculoskeletal:       Per HPI, otherwise negative  Skin: Negative.   Neurological: Negative for syncope.    Physical Exam Updated Vital Signs BP 122/79   Pulse 72   Temp 98.1 F (36.7 C) (Oral)   Resp 18   Ht 5\' 10"  (1.778 m)   Wt 97.5 kg   SpO2 97%   BMI 30.85 kg/m   Physical Exam Vitals and nursing note reviewed.  Constitutional:      General: He is not in acute distress.    Appearance: He is well-developed.  HENT:     Head: Normocephalic and atraumatic.  Eyes:     Conjunctiva/sclera: Conjunctivae normal.  Cardiovascular:     Rate and Rhythm: Normal rate and regular rhythm.  Pulmonary:     Effort: Pulmonary effort is normal. No respiratory distress.     Breath sounds: No stridor.  Abdominal:     Tenderness: There is no abdominal tenderness. There is no guarding.  Genitourinary:    Rectum: Guaiac result positive. No mass, tenderness or anal fissure.  Skin:    General: Skin is warm and dry.  Neurological:     Mental Status: He is alert and oriented to person, place, and time.      ED Results / Procedures / Treatments   Labs (all labs ordered are listed, but only abnormal results are displayed) Labs Reviewed  COMPREHENSIVE METABOLIC PANEL - Abnormal; Notable for the following components:      Result Value   Potassium 2.7 (*)    Chloride 96 (*)    Glucose, Bld 104 (*)    BUN 23 (*)    Calcium 8.6 (*)    All other components within normal limits  CBC - Abnormal; Notable for the following components:   MCV 100.7 (*)    MCH 36.3 (*)    All other components within normal limits  POC OCCULT BLOOD, ED - Abnormal; Notable for the following components:   Fecal Occult Bld POSITIVE (*)    All other components within normal limits  TYPE AND SCREEN    EKG None  Radiology CT Abdomen Pelvis W Contrast  Result Date: 06/29/2020 CLINICAL DATA:  Colon cancer now with bleeding.  [name]. EXAM: CT ABDOMEN AND  PELVIS WITH CONTRAST TECHNIQUE: Multidetector CT imaging of the abdomen and pelvis was performed using the standard protocol following bolus administration of intravenous contrast. CONTRAST:  134mL OMNIPAQUE IOHEXOL 300 MG/ML  SOLN COMPARISON:  08/23/2018 abdominal MRI.  Most recent CT 09/06/2017. FINDINGS: Lower chest: Clear lung bases. Normal heart size without pericardial or pleural effusion. Hepatobiliary: Normal liver. Normal gallbladder, without biliary ductal dilatation. Pancreas: Normal, without mass or ductal dilatation. Spleen: Normal in size, without focal abnormality. Adrenals/Urinary Tract: Normal adrenal glands. Normal kidneys, without hydronephrosis. Normal urinary bladder. Stomach/Bowel: Normal stomach, without wall thickening. Subtotal colectomy. The remaining rectum and sigmoid demonstrate no dominant mass. Normal small bowel. Vascular/Lymphatic: Aortic atherosclerosis. No abdominopelvic adenopathy. Reproductive: Normal prostate. Other: No significant free fluid. No evidence of omental or peritoneal disease. Musculoskeletal: Degenerate disc disease at the lumbosacral junction. IMPRESSION: 1. Subtotal colectomy, without recurrent or metastatic disease. No explanation for GI bleeding. 2. Aortic Atherosclerosis (ICD10-I70.0).  This is age advanced. Electronically Signed   By: Abigail Miyamoto M.D.   On: 06/29/2020 10:23    Procedures Procedures (including critical care time)  Medications Ordered in ED Medications  sodium chloride (PF) 0.9 % injection (has no administration in time range)  0.9 % NaCl with KCl 20 mEq/ L  infusion ( Intravenous Stopped 06/29/20 1302)  potassium chloride SA (KLOR-CON) CR tablet 40 mEq (40 mEq Oral Given 06/29/20 1012)  iohexol (OMNIPAQUE) 300 MG/ML solution 100 mL (100 mLs Intravenous Contrast Given 06/29/20 6270)    ED Course  I have reviewed the triage vital signs and the nursing notes.  Pertinent labs & imaging results that were available during my care of  the patient were reviewed by  me and considered in my medical decision making (see chart for details).   Adult male with history of colon cancer status post multiple surgeries, hemicolectomy now presents with rectal bleeding. Note patient's abdominal exam is soft, and vital signs are unremarkable, given his history of malignancy, CT scan, labs ordered after initial evaluation.   1:20 PM Patient is completed his potassium IV and oral repletion. He notes that he has been taking his supplements, but given today's low values, he will have repeat labs performed within a week with his primary care physician. Patient is amenable to this. In regards to the patient's bleeding, CT scan is generally reassuring, no evidence for diverticulitis, colitis, recurrence of his metastatic disease. Some suspicion for mild diverticulosis versus internal hemorrhoid.  Absent hemodynamic instability, after hours of monitoring, with no nondefecation related bleeding episodes, patient is appropriate for close outpatient follow-up with his GI doctor, he will call tomorrow.  MDM Number of Diagnoses or Management Options Hypokalemia: established, worsening Rectal bleeding: new, needed workup   Amount and/or Complexity of Data Reviewed Clinical lab tests: reviewed Tests in the radiology section of CPT: reviewed Tests in the medicine section of CPT: reviewed Decide to obtain previous medical records or to obtain history from someone other than the patient: yes Review and summarize past medical records: yes Independent visualization of images, tracings, or specimens: yes  Risk of Complications, Morbidity, and/or Mortality Presenting problems: high Diagnostic procedures: high Management options: high  Critical Care Total time providing critical care: < 30 minutes  Patient Progress Patient progress: stable   Final Clinical Impression(s) / ED Diagnoses Final diagnoses:  Rectal bleeding  Hypokalemia       Carmin Muskrat, MD 06/29/20 1322

## 2020-06-29 NOTE — ED Triage Notes (Addendum)
Patient reports bright red blood in stool yesterday. Patient sees GI specialist but denies history of GI bleed. No use of anticoagulants.

## 2020-06-29 NOTE — ED Notes (Signed)
Critical Value Potassium 2.7 

## 2020-06-30 ENCOUNTER — Telehealth: Payer: Self-pay | Admitting: Family Medicine

## 2020-06-30 ENCOUNTER — Telehealth: Payer: Self-pay | Admitting: Gastroenterology

## 2020-06-30 ENCOUNTER — Other Ambulatory Visit: Payer: Self-pay | Admitting: Family Medicine

## 2020-06-30 MED ORDER — POTASSIUM CHLORIDE CRYS ER 20 MEQ PO TBCR: 20.0000 meq | EXTENDED_RELEASE_TABLET | Freq: Three times a day (TID) | ORAL | 2 refills | Status: DC

## 2020-06-30 NOTE — Telephone Encounter (Signed)
Patient aware of recommendations and appt made  

## 2020-06-30 NOTE — Telephone Encounter (Signed)
Patient called states over the weekend he had\has rectal bleeding and thinks he needs a colonoscopy sooner then previously discussed. Please advise

## 2020-06-30 NOTE — Telephone Encounter (Signed)
Pt states that he was having blood in his stool Saturday. Went to the ER yesterday. Er is wanting PCP to increase potassium as it is still low. He is wanting to see if the meds he is on is causing the bloody stools. He states no blood today. Pt states he recently started pregabaline 75mg . Prescribed by his neurosurgeon. Only taken once. (pt had a hard time seeing the medication bottle)

## 2020-06-30 NOTE — Telephone Encounter (Signed)
The blood in the stool is a very common side effect of both indomethacin and naproxen.  Patient to stop taking both of those medicines.  He should increase his potassium tablet to 3 times a day.  He needs to be seen in about a week.

## 2020-06-30 NOTE — Telephone Encounter (Signed)
Left message on machine to call back  

## 2020-07-01 NOTE — Telephone Encounter (Signed)
Left message on machine to call back also sent My Chart messasge.

## 2020-07-02 ENCOUNTER — Other Ambulatory Visit: Payer: Self-pay

## 2020-07-02 ENCOUNTER — Ambulatory Visit (HOSPITAL_COMMUNITY)
Admission: RE | Admit: 2020-07-02 | Discharge: 2020-07-02 | Disposition: A | Discharge status: Home / Self Care | Payer: 59 | Source: Ambulatory Visit | Attending: Family Medicine | Admitting: Family Medicine

## 2020-07-02 DIAGNOSIS — M5417 Radiculopathy, lumbosacral region: Secondary | ICD-10-CM | POA: Diagnosis not present

## 2020-07-02 NOTE — Telephone Encounter (Signed)
Left message on machine to call back I have left several messages for the pt to return call and also sent a message via My Chart.  Will await further communication from the pt.

## 2020-07-07 ENCOUNTER — Encounter: Payer: Self-pay | Admitting: Family Medicine

## 2020-07-07 ENCOUNTER — Ambulatory Visit (INDEPENDENT_AMBULATORY_CARE_PROVIDER_SITE_OTHER): Payer: 59 | Admitting: Family Medicine

## 2020-07-07 ENCOUNTER — Other Ambulatory Visit: Payer: Self-pay

## 2020-07-07 VITALS — BP 136/76 | HR 85 | Temp 98.3°F | Ht 70.0 in | Wt 210.0 lb

## 2020-07-07 DIAGNOSIS — E876 Hypokalemia: Secondary | ICD-10-CM

## 2020-07-07 DIAGNOSIS — K921 Melena: Secondary | ICD-10-CM
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
## 2020-07-08 LAB — CBC WITH DIFFERENTIAL/PLATELET
Basophils Absolute: 0.1 10*3/uL (ref 0.0–0.2)
Basos: 1 %
EOS (ABSOLUTE): 0.2 10*3/uL (ref 0.0–0.4)
Eos: 2 %
Hematocrit: 48.3 % (ref 37.5–51.0)
Hemoglobin: 17 g/dL (ref 13.0–17.7)
Immature Grans (Abs): 0.1 10*3/uL (ref 0.0–0.1)
Immature Granulocytes: 1 %
Lymphocytes Absolute: 1.3 10*3/uL (ref 0.7–3.1)
Lymphs: 16 %
MCH: 35.5 pg — ABNORMAL HIGH (ref 26.6–33.0)
MCHC: 35.2 g/dL (ref 31.5–35.7)
MCV: 101 fL — ABNORMAL HIGH (ref 79–97)
Monocytes Absolute: 1 10*3/uL — ABNORMAL HIGH (ref 0.1–0.9)
Monocytes: 12 %
Neutrophils Absolute: 6 10*3/uL (ref 1.4–7.0)
Neutrophils: 68 %
Platelets: 183 10*3/uL (ref 150–450)
RBC: 4.79 x10E6/uL (ref 4.14–5.80)
RDW: 12.6 % (ref 11.6–15.4)
WBC: 8.5 10*3/uL (ref 3.4–10.8)

## 2020-07-08 LAB — BMP8+EGFR
BUN/Creatinine Ratio: 18 (ref 9–20)
BUN: 15 mg/dL (ref 6–24)
CO2: 26 mmol/L (ref 20–29)
Calcium: 9.5 mg/dL (ref 8.7–10.2)
Chloride: 95 mmol/L — ABNORMAL LOW (ref 96–106)
Creatinine, Ser: 0.85 mg/dL (ref 0.76–1.27)
GFR calc Af Amer: 120 mL/min/{1.73_m2} (ref >59)
GFR calc non Af Amer: 104 mL/min/{1.73_m2} (ref >59)
Glucose: 103 mg/dL — ABNORMAL HIGH (ref 65–99)
Potassium: 2.9 mmol/L — ABNORMAL LOW (ref 3.5–5.2)
Sodium: 137 mmol/L (ref 134–144)

## 2020-07-09 ENCOUNTER — Other Ambulatory Visit: Payer: Self-pay | Admitting: *Deleted

## 2020-07-09 DIAGNOSIS — E876 Hypokalemia: Secondary | ICD-10-CM

## 2020-07-18 ENCOUNTER — Other Ambulatory Visit: Payer: 59

## 2020-07-18 ENCOUNTER — Other Ambulatory Visit: Payer: Self-pay

## 2020-07-18 DIAGNOSIS — E876 Hypokalemia: Secondary | ICD-10-CM

## 2020-07-19 LAB — BMP8+EGFR
BUN/Creatinine Ratio: 16 (ref 9–20)
BUN: 16 mg/dL (ref 6–24)
CO2: 24 mmol/L (ref 20–29)
Calcium: 9.3 mg/dL (ref 8.7–10.2)
Chloride: 103 mmol/L (ref 96–106)
Creatinine, Ser: 0.98 mg/dL (ref 0.76–1.27)
GFR calc Af Amer: 106 mL/min/{1.73_m2} (ref >59)
GFR calc non Af Amer: 91 mL/min/{1.73_m2} (ref >59)
Glucose: 100 mg/dL — ABNORMAL HIGH (ref 65–99)
Potassium: 4.1 mmol/L (ref 3.5–5.2)
Sodium: 141 mmol/L (ref 134–144)

## 2020-07-21 ENCOUNTER — Telehealth: Payer: Self-pay | Admitting: Family Medicine

## 2020-07-21 NOTE — Telephone Encounter (Signed)
Patient aware of lab results.

## 2020-07-25 ENCOUNTER — Other Ambulatory Visit: Payer: Self-pay

## 2020-07-25 MED ORDER — POTASSIUM CHLORIDE CRYS ER 20 MEQ PO TBCR: 20.0000 meq | EXTENDED_RELEASE_TABLET | Freq: Three times a day (TID) | ORAL | 2 refills | Status: DC

## 2020-08-26 ENCOUNTER — Other Ambulatory Visit: Payer: Self-pay | Admitting: Family Medicine

## 2020-08-26 ENCOUNTER — Telehealth: Payer: Self-pay | Admitting: Family Medicine

## 2020-08-26 DIAGNOSIS — Z1211 Encounter for screening for malignant neoplasm of colon: Secondary | ICD-10-CM

## 2020-08-26 NOTE — Telephone Encounter (Signed)
REFERRAL REQUEST Telephone Note  Have you been seen at our office for this problem?yes (Advise that they may need an appointment with their PCP before a referral can be done)  Reason for Referral: Colonscopy Referral discussed with patient: yes Best contact number of patient for referral team:   [phone] Has patient been seen by a specialist for this issue before: yes Patient provider preference for referral: [name], Dr. Edison Nasuti Patient location preference for referral: [facility]   Patient notified that referrals can take up to a week or longer to process. If they haven't heard anything within a week they should call back and speak with the referral department.

## 2020-09-02 ENCOUNTER — Other Ambulatory Visit: Payer: Self-pay | Admitting: Family Medicine

## 2020-09-02 DIAGNOSIS — M5417 Radiculopathy, lumbosacral region: Secondary | ICD-10-CM

## 2020-09-02 DIAGNOSIS — M4306 Spondylolysis, lumbar region: Secondary | ICD-10-CM

## 2020-09-02 DIAGNOSIS — M48061 Spinal stenosis, lumbar region without neurogenic claudication: Secondary | ICD-10-CM

## 2020-09-02 DIAGNOSIS — G8929 Other chronic pain: Secondary | ICD-10-CM

## 2020-09-03 ENCOUNTER — Other Ambulatory Visit: Payer: Self-pay | Admitting: Family Medicine

## 2020-09-03 ENCOUNTER — Other Ambulatory Visit: Payer: Self-pay

## 2020-09-03 MED ORDER — NAPROXEN 500 MG PO TABS: 500.0000 mg | ORAL_TABLET | Freq: Two times a day (BID) | ORAL | 1 refills | Status: DC

## 2020-09-03 NOTE — Telephone Encounter (Signed)
  Prescription Request  09/03/2020  What is the name of the medication or equipment? naproxen (NAPROSYN) 500 MG tablet  Have you contacted your pharmacy to request a refill? (if applicable) yes refused because d/c but pt says that he does not know why if he has been taking it. Also he says that he has been getting refills.  Which pharmacy would you like this sent to? [facility]   Patient notified that their request is being sent to the clinical staff for review and that they should receive a response within 2 business days.

## 2020-09-03 NOTE — Telephone Encounter (Signed)
Medication is on patient's med list and was last refilled on 07/30/2020.  I am sending in another refill, patient is aware.

## 2020-09-10 ENCOUNTER — Telehealth: Payer: Self-pay | Admitting: Gastroenterology

## 2020-09-10 ENCOUNTER — Ambulatory Visit (INDEPENDENT_AMBULATORY_CARE_PROVIDER_SITE_OTHER): Payer: 59 | Admitting: Family Medicine

## 2020-09-10 ENCOUNTER — Encounter: Payer: Self-pay | Admitting: Family Medicine

## 2020-09-10 DIAGNOSIS — M542 Cervicalgia: Secondary | ICD-10-CM

## 2020-09-10 MED ORDER — TIZANIDINE HCL 4 MG PO TABS: 4.0000 mg | ORAL_TABLET | Freq: Three times a day (TID) | ORAL | 0 refills | Status: DC | PRN

## 2020-09-10 NOTE — Progress Notes (Signed)
Virtual Visit via Telephone Note  I connected with Charles Santos on 09/10/20 at 1:38 PM by telephone and verified that I am speaking with the correct person using two identifiers. Charles Santos is currently located at home and nobody is currently with him during this visit. The provider, Loman Brooklyn, FNP is located in their office at time of visit.  I discussed the limitations, risks, security and privacy concerns of performing an evaluation and management service by telephone and the availability of in person appointments. I also discussed with the patient that there may be a patient responsible charge related to this service. The patient expressed understanding and agreed to proceed.  Subjective: PCP: Claretta Fraise, MD  Chief Complaint  Patient presents with  . Neck Pain   Patient reports muscular pain that goes from his neck to his left shoulder that he reports has been going on for 1 week, although he saw urgent care for the same symptoms a little over 2 weeks ago.  At urgent care he was given a shot of steroids, oral steroids, and tizanidine.  Patient reports he did take all of the oral steroids and is still taking tizanidine once daily as prescribed.  He is also applying a heating pad and taking naproxen 500 mg twice daily.  Today he rates his pain 5/10.  He reports full range of motion in the left shoulder.  He is not able to turn his head very far to the left.   ROS: Per HPI  Current Outpatient Medications:  .  atorvastatin (LIPITOR) 40 MG tablet, Take 1 tablet (40 mg total) by mouth daily., Disp: 90 tablet, Rfl: 3 .  chlorthalidone (HYGROTON) 25 MG tablet, Take 1 tablet (25 mg total) by mouth daily., Disp: 90 tablet, Rfl: 3 .  gemfibrozil (LOPID) 600 MG tablet, TAKE  (1)  TABLET TWICE A DAY WITH MEALS (BREAKFAST AND SUPPER) (Patient taking differently: Take 600 mg by mouth 2 (two) times daily before a meal. TAKE  (1)  TABLET TWICE A DAY WITH MEALS (BREAKFAST AND SUPPER)), Disp:  60 tablet, Rfl: 11 .  naproxen (NAPROSYN) 500 MG tablet, Take 1 tablet (500 mg total) by mouth 2 (two) times daily., Disp: 60 tablet, Rfl: 1 .  Omega-3 Fatty Acids (FISH OIL) 1000 MG CPDR, Take 2 capsules by mouth daily., Disp: , Rfl:  .  potassium chloride SA (KLOR-CON M20) 20 MEQ tablet, Take 1 tablet (20 mEq total) by mouth 3 (three) times daily., Disp: 90 tablet, Rfl: 2 .  pregabalin (LYRICA) 75 MG capsule, Take 75 mg by mouth in the morning and at bedtime., Disp: , Rfl:   Allergies  Allergen Reactions  . Adhesive [Tape] Rash    Prefers paper tape   Past Medical History:  Diagnosis Date  . Anxiety   . Cancer of transverse colon ([place]) 09/17/2016   surgery done  . Colon cancer [facility]) 1998   surgery and chemo  . Complication of anesthesia    not fully under for polyp removal at [place] 2017  . Depression   . Emphysema of lung ([place])   . Family hx of colon cancer   . GERD (gastroesophageal reflux disease)    none recent  . Headache   . Hyperlipidemia   . Hypertension   . Vitamin D deficiency     Observations/Objective: A&O  No respiratory distress or wheezing audible over the phone Mood, judgement, and thought processes all WNL   Assessment and Plan: 1. Musculoskeletal  neck pain - Tizanidine refilled with frequency increased to 3 times daily as needed.  Patient to continue heating pad and naproxen.  Discussed he can add in a muscle rub.  I am referring him to physical therapy to see if he is a candidate for dry needling. - Ambulatory referral to Physical Therapy   Follow Up Instructions:  I discussed the assessment and treatment plan with the patient. The patient was provided an opportunity to ask questions and all were answered. The patient agreed with the plan and demonstrated an understanding of the instructions.   The patient was advised to call back or seek an in-person evaluation if the symptoms worsen or if the condition fails to improve as anticipated.  The  above assessment and management plan was discussed with the patient. The patient verbalized understanding of and has agreed to the management plan. Patient is aware to call the clinic if symptoms persist or worsen. Patient is aware when to return to the clinic for a follow-up visit. Patient educated on when it is appropriate to go to the emergency department.   Time call ended: 1:52 PM  I provided 16 minutes of non-face-to-face time during this encounter.  [name], MSN, APRN, FNP-C [facility] 09/10/20

## 2020-09-10 NOTE — Telephone Encounter (Signed)
Pt is requesting a call back to schedule his flexible sigmoidoscopy

## 2020-09-10 NOTE — Telephone Encounter (Signed)
Can you please call the pt and schedule  Flex sig with previsit?  Thank you

## 2020-09-11 ENCOUNTER — Telehealth: Payer: Self-pay | Admitting: Family Medicine

## 2020-09-11 ENCOUNTER — Ambulatory Visit: Payer: 59 | Attending: Family Medicine | Admitting: Physical Therapy

## 2020-09-11 ENCOUNTER — Other Ambulatory Visit: Payer: Self-pay

## 2020-09-11 DIAGNOSIS — M542 Cervicalgia: Secondary | ICD-10-CM | POA: Diagnosis present

## 2020-09-11 NOTE — Patient Instructions (Signed)
[facility]  DRY NEEDLING CONSENT FORM   Trigger point dry needling is a physical therapy approach to treat Myofascial Pain and Dysfunction.  Dry Needling (DN) is a valuable and effective way to deactivate myofascial trigger points (muscle knots/pain). It is skilled intervention that uses a thin filiform needle to penetrate the skin and stimulate underlying myofascial trigger points, muscular, and connective tissues for the management of neuromusculoskeletal pain and movement impairments.  A local twitch response (LTR) will be elicited.  This can sometimes feel like a deep ache in the muscle during the procedure. Multiple trigger points in multiple muscles can be treated during each treatment.  No medication of any kind is injected.   As with any medical treatment and procedure, there are possible adverse events.  While significant adverse events are uncommon, they do sometimes occur and must be considered prior to giving consent.  1. Dry needling often causes a "post needling soreness".  There can be an increase in pain from a couple of hours to 2-3 days, followed by an improvement in the overall pain state. 2. Any time a needle is used there is a risk of infection.  However, we are using new, sterile, and disposable needles; infections are extremely rare. 3. There is a possibility that you may bleed or bruise.  You may feel tired and some nausea following treatment. 4. There is a rare possibility of a pneumothorax (air in the chest cavity). 5. Allergic reaction to nickel in the stainless steel needle. 6. If a nerve is touched, it may cause paresthesia (a prickling/shock sensation) which is usually brief, but may continue for a couple of days.  Following treatment stay hydrated.  Continue regular activities but not too vigorous initially after treatment for 24-48 hours.  Dry Needling is best when combined with other physical therapy interventions such as  strengthening, stretching and other therapeutic modalities.   PLEASE ANSWER THE FOLLOWING QUESTIONS:  Do you have a lack of sensation?   Y/N  Do you have a phobia or fear of needles  Y/N  Are you pregnant?    Y/N If yes:  How many weeks? __________ Do you have any implanted devices?  Y/N If yes:  Pacemaker/Spinal Cord Stimulator/Deep Brain Stimulator/Insulin Pump/Other: ________________ Do you have any implants?  Y/N If yes: Breast/Facial/Pecs/Buttocks/Calves/Hip  Replacement/ Knee Replacement/Other: _________ Do you take any blood thinners?   Y/N If yes: Coumadin (Warfarin)/Other: ___________________ Do you have a bleeding disorder?   Y/N If yes: What kind: _________________________________ Do you take any immunosuppressants?  Y/N If yes:   What kind: _________________________________ Do you take anti-inflammatories?   Y/N If yes: What kind: Advil/Aspirin/Other: ________________ Have you ever been diagnosed with Scoliosis? Y/N Have you had back surgery?   Y/N If yes:  Laminectomy/Fusion/Other: ___________________   I have read, or had read to me, the above.  I have had the opportunity to ask any questions.  All of my questions have been answered to my satisfaction and I understand the risks involved with dry needling.  I consent to examination and treatment at [facility], including dry needling, of any and all of my involved and affected muscles.  
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
## 2020-09-11 NOTE — Telephone Encounter (Signed)
Okay for work note? 

## 2020-09-12 ENCOUNTER — Ambulatory Visit: Payer: 59 | Attending: Family Medicine | Admitting: *Deleted

## 2020-09-12 DIAGNOSIS — M542 Cervicalgia: Secondary | ICD-10-CM | POA: Diagnosis not present

## 2020-09-12 DIAGNOSIS — M5441 Lumbago with sciatica, right side: Secondary | ICD-10-CM | POA: Diagnosis present

## 2020-09-12 NOTE — Telephone Encounter (Signed)
Patient informed letter will be upfront for pick up
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
## 2020-09-12 NOTE — Telephone Encounter (Signed)
I am fine with that 

## 2020-09-15 ENCOUNTER — Ambulatory Visit: Payer: 59 | Admitting: Physical Therapy

## 2020-09-15 ENCOUNTER — Other Ambulatory Visit: Payer: Self-pay

## 2020-09-15 DIAGNOSIS — M542 Cervicalgia: Secondary | ICD-10-CM | POA: Diagnosis not present
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
## 2020-09-17 ENCOUNTER — Ambulatory Visit: Payer: 59 | Admitting: Physical Therapy

## 2020-09-17 ENCOUNTER — Other Ambulatory Visit: Payer: Self-pay

## 2020-09-17 DIAGNOSIS — M542 Cervicalgia: Secondary | ICD-10-CM

## 2020-09-17 DIAGNOSIS — M5441 Lumbago with sciatica, right side: Secondary | ICD-10-CM
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
## 2020-09-17 NOTE — Patient Instructions (Signed)
AROM: Neck Rotation   Turn head slowly to look over one shoulder, then the other. Hold each position _10___ seconds. Repeat _5___ times per set. Do __2__ sets per session. Do _2-3___ sessions per day.  http://orth.exer.us/294   Copyright  VHI. All rights reserved.  AROM: Lateral Neck Flexion   Slowly tilt head toward one shoulder, then the other. Hold each position _10___ seconds. Repeat __5__ times per set. Do __2__ sets per session. Do __2-3__ sessions per day.  http://orth.exer.us/296   Copyright  VHI. All rights reserved.  Stretch Break - Chin Tuck   Looking straight forward, tuck chin and hold __10__ seconds. Relax and return to starting position. Repeat __5-10__ times every _3-4___ hours.   Stretch Break - Chest and Shoulder Stretch   Maintaining erect posture, draw shoulders back while bringing elbows back and inward. Return to starting position. Repeat __10-20__ times every _3-4___ hours.   Strengthening: Rotation - Isometric (in Neutral)   Using light pressure from fingertips at right/left temple, resist turning head. Hold _5___ seconds. Repeat _5___ times per set. Do ___2_ sets per session. Do __2_ sessions per day.   Strengthening: Flexion - Isometric (in Neutral)   Using light pressure from fingertips at forehead, resist bending head forward. Hold __5__ seconds. Repeat __5__ times per set. Do ___2_ sets per session. Do __2__ sessions per day.   Strengthening: Extension - Isometric (in Neutral)   Using light pressure from fingertips at back of head, resist bending head backward. Hold _5___ seconds. Repeat __5__ times per set. Do ___2_ sets per session. Do _2___ sessions per day.

## 2020-09-18 ENCOUNTER — Telehealth: Payer: Self-pay

## 2020-09-18 NOTE — Telephone Encounter (Signed)
Patient was seen by you on 09/10/2020 for neck pain and was referred to physical therapy.  He has had 4 sessions, is seeing some improvement, but is still having a lot of pain.  He would like to have a CT or MRI scan to see if there is possibly a problem with one of the discs that is causing this pain.  Please advise.

## 2020-09-19 ENCOUNTER — Ambulatory Visit: Payer: 59 | Admitting: *Deleted

## 2020-09-19 ENCOUNTER — Other Ambulatory Visit: Payer: Self-pay

## 2020-09-19 DIAGNOSIS — M5441 Lumbago with sciatica, right side: Secondary | ICD-10-CM

## 2020-09-19 DIAGNOSIS — M542 Cervicalgia: Secondary | ICD-10-CM | POA: Diagnosis not present
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
## 2020-09-19 NOTE — Telephone Encounter (Signed)
Insurance will not approve a MRI until he has done 6 weeks of physical therapy. Please schedule an appointment with PCP for f/u 6 weeks from when he started PT so that it can be ordered if needed. There will need to be a visit with documentation of outcome of PT.

## 2020-09-19 NOTE — Telephone Encounter (Signed)
Pt aware and says he understands

## 2020-09-22 ENCOUNTER — Ambulatory Visit: Payer: 59 | Admitting: Family Medicine

## 2020-09-24 ENCOUNTER — Encounter: Payer: Self-pay | Admitting: Physical Therapy

## 2020-09-24 ENCOUNTER — Ambulatory Visit: Payer: 59 | Admitting: Physical Therapy

## 2020-09-24 ENCOUNTER — Other Ambulatory Visit: Payer: Self-pay

## 2020-09-24 DIAGNOSIS — M542 Cervicalgia: Secondary | ICD-10-CM

## 2020-09-24 DIAGNOSIS — M5441 Lumbago with sciatica, right side: Secondary | ICD-10-CM
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
## 2020-09-25 ENCOUNTER — Other Ambulatory Visit: Payer: Self-pay

## 2020-09-25 ENCOUNTER — Ambulatory Visit: Payer: 59 | Admitting: Physical Therapy

## 2020-09-25 DIAGNOSIS — M5441 Lumbago with sciatica, right side: Secondary | ICD-10-CM

## 2020-09-25 DIAGNOSIS — M542 Cervicalgia: Secondary | ICD-10-CM | POA: Diagnosis not present
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
## 2020-09-29 ENCOUNTER — Other Ambulatory Visit: Payer: Self-pay

## 2020-09-29 ENCOUNTER — Other Ambulatory Visit: Payer: Self-pay | Admitting: Family Medicine

## 2020-09-29 ENCOUNTER — Ambulatory Visit: Payer: 59 | Admitting: Physical Therapy

## 2020-09-29 DIAGNOSIS — M542 Cervicalgia: Secondary | ICD-10-CM

## 2020-09-29 DIAGNOSIS — M5441 Lumbago with sciatica, right side: Secondary | ICD-10-CM
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
## 2020-09-30 ENCOUNTER — Ambulatory Visit: Payer: 59 | Admitting: *Deleted

## 2020-09-30 DIAGNOSIS — M542 Cervicalgia: Secondary | ICD-10-CM

## 2020-09-30 DIAGNOSIS — M5441 Lumbago with sciatica, right side: Secondary | ICD-10-CM
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
## 2020-10-03 ENCOUNTER — Telehealth: Payer: Self-pay

## 2020-10-03 ENCOUNTER — Other Ambulatory Visit: Payer: Self-pay

## 2020-10-03 ENCOUNTER — Ambulatory Visit: Payer: 59 | Admitting: Physical Therapy

## 2020-10-03 DIAGNOSIS — M542 Cervicalgia: Secondary | ICD-10-CM | POA: Diagnosis not present

## 2020-10-03 DIAGNOSIS — M5441 Lumbago with sciatica, right side: Secondary | ICD-10-CM

## 2020-10-03 NOTE — Telephone Encounter (Signed)
Patient would like to know when he can get a MRI since he has been doing PT. Please advise
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
## 2020-10-05 NOTE — Telephone Encounter (Signed)
Pt. Needs to be seen for this. Thanks, WS 

## 2020-10-06 NOTE — Telephone Encounter (Signed)
Pt returned missed call. Pt has appt scheduled already with Dr Livia Snellen on 10/27/20. Will speak with Dr Livia Snellen about MRI Referral when he comes in.

## 2020-10-06 NOTE — Telephone Encounter (Signed)
Left message to call back  

## 2020-10-08 ENCOUNTER — Ambulatory Visit: Payer: 59 | Admitting: Physical Therapy

## 2020-10-08 ENCOUNTER — Encounter: Payer: Self-pay | Admitting: Physical Therapy

## 2020-10-08 ENCOUNTER — Other Ambulatory Visit: Payer: Self-pay

## 2020-10-08 DIAGNOSIS — M5441 Lumbago with sciatica, right side: Secondary | ICD-10-CM

## 2020-10-08 DIAGNOSIS — M542 Cervicalgia: Secondary | ICD-10-CM | POA: Diagnosis not present
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
## 2020-10-09 ENCOUNTER — Ambulatory Visit: Payer: 59 | Admitting: Physical Therapy

## 2020-10-09 ENCOUNTER — Encounter: Payer: 59 | Admitting: Physical Therapy

## 2020-10-09 DIAGNOSIS — M542 Cervicalgia: Secondary | ICD-10-CM | POA: Diagnosis not present

## 2020-10-09 DIAGNOSIS — M5441 Lumbago with sciatica, right side: Secondary | ICD-10-CM
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
## 2020-10-22 ENCOUNTER — Other Ambulatory Visit: Payer: Self-pay | Admitting: Family Medicine

## 2020-10-22 DIAGNOSIS — M542 Cervicalgia: Secondary | ICD-10-CM

## 2020-10-27 ENCOUNTER — Other Ambulatory Visit: Payer: Self-pay

## 2020-10-27 ENCOUNTER — Ambulatory Visit (INDEPENDENT_AMBULATORY_CARE_PROVIDER_SITE_OTHER): Payer: 59 | Admitting: Family Medicine

## 2020-10-27 ENCOUNTER — Encounter: Payer: Self-pay | Admitting: Family Medicine

## 2020-10-27 VITALS — BP 129/78 | HR 88 | Temp 97.2°F | Ht 70.0 in | Wt 215.6 lb

## 2020-10-27 DIAGNOSIS — E781 Pure hyperglyceridemia: Secondary | ICD-10-CM

## 2020-10-27 DIAGNOSIS — I10 Essential (primary) hypertension: Secondary | ICD-10-CM

## 2020-10-27 DIAGNOSIS — Z23 Encounter for immunization: Secondary | ICD-10-CM | POA: Diagnosis not present

## 2020-10-27 DIAGNOSIS — M5412 Radiculopathy, cervical region: Secondary | ICD-10-CM

## 2020-10-27 DIAGNOSIS — M5417 Radiculopathy, lumbosacral region: Secondary | ICD-10-CM

## 2020-10-27 DIAGNOSIS — Z1509 Genetic susceptibility to other malignant neoplasm: Secondary | ICD-10-CM

## 2020-10-27 MED ORDER — NAPROXEN 500 MG PO TABS: 500.0000 mg | ORAL_TABLET | Freq: Two times a day (BID) | ORAL | 1 refills | Status: DC

## 2020-10-27 MED ORDER — GABAPENTIN 300 MG PO CAPS: ORAL_CAPSULE | ORAL | 0 refills | Status: DC

## 2020-10-27 NOTE — Progress Notes (Signed)
Subjective:  Patient ID: Charles Santos, male    DOB: 06/03/1971  Age: 49 y.o. MRN: [id number]  CC: Follow-up (6 month)   HPI Charles Santos presents for recheck of his shoulder and back.  The left shoulder still hurts intermittently.  The left shoulder is numb.  He said that he does help it.  He had a single day episode where he had tingling in the fourth and fifth fingers of the left hand.  He has an MRI set up for 2 weeks from now with [place] [facility].  He is to see their doctor the same day.  Additionally they are going to reevaluate his right leg and back.  This is because he is getting some pain radiating down the posterior lateral right thigh.  He twisted and bent several days ago and pain recurred in that area.  Patient discontinued the Lyrica when he was here in July due to passing blood when taken with the naproxen.  Depression screen [facility] 2/9 07/07/2020 06/17/2020 05/16/2020  Decreased Interest 0 0 0  Down, Depressed, Hopeless 0 0 0  PHQ - 2 Score 0 0 0  Altered sleeping - - -  Tired, decreased energy - - -  Change in appetite - - -  Feeling bad or failure about yourself  - - -  Trouble concentrating - - -  Moving slowly or fidgety/restless - - -  Suicidal thoughts - - -  PHQ-9 Score - - -  Difficult doing work/chores - - -    History Charles Santos has a past medical history of Anxiety, Cancer of transverse colon ([place]) (09/17/2016), Colon cancer ([place]) (7829), Complication of anesthesia, Depression, Emphysema of lung ([place]), Family hx of colon cancer, GERD (gastroesophageal reflux disease), Headache, Hyperlipidemia, Hypertension, and Vitamin D deficiency.   He has a past surgical history that includes colon resectomy (1998); Appendectomy; Laparoscopic subtotal colectomy (N/A, 09/17/2016); Proctoscopy (09/17/2016); Inguinal hernia repair; Upper gi endoscopy; polyp removal at [facility] (2017); Flexible sigmoidoscopy (N/A, 05/19/2017); Esophagogastroduodenoscopy (egd) with propofol (N/A,  05/19/2017); and Colonoscopy.   His family history includes Colon cancer in his father.He reports that he has been smoking cigarettes. He has a 23.00 pack-year smoking history. He has never used smokeless tobacco. He reports current alcohol use of about 12.0 standard drinks of alcohol per week. He reports that he does not use drugs.    ROS Review of Systems  Constitutional: Negative for fever.  Respiratory: Negative for shortness of breath.   Cardiovascular: Negative for chest pain.  Musculoskeletal: Positive for arthralgias and back pain.  Skin: Negative for rash.  Neurological: Positive for numbness (left 4,5 fingers for one day).    Objective:  BP 129/78   Pulse 88   Temp (!) 97.2 F (36.2 C) (Temporal)   Ht 5\' 10"  (1.778 m)   Wt 215 lb 9.6 oz (97.8 kg)   BMI 30.94 kg/m   BP Readings from Last 3 Encounters:  10/27/20 129/78  07/07/20 (!) 136/76  06/29/20 122/79    Wt Readings from Last 3 Encounters:  10/27/20 215 lb 9.6 oz (97.8 kg)  07/07/20 (!) 210 lb (95.3 kg)  06/29/20 215 lb (97.5 kg)     Physical Exam Vitals reviewed.  Constitutional:      Appearance: He is well-developed.  HENT:     Head: Normocephalic and atraumatic.     Right Ear: External ear normal.     Left Ear: External ear normal.     Mouth/Throat:     Pharynx:  No oropharyngeal exudate or posterior oropharyngeal erythema.  Eyes:     Pupils: Pupils are equal, round, and reactive to light.  Cardiovascular:     Rate and Rhythm: Normal rate and regular rhythm.     Heart sounds: No murmur heard.   Pulmonary:     Effort: No respiratory distress.     Breath sounds: Normal breath sounds.  Musculoskeletal:        General: Tenderness (superior left trap border) present. Normal range of motion.     Cervical back: Normal range of motion and neck supple.  Skin:    General: Skin is warm and dry.  Neurological:     Mental Status: He is alert and oriented to person, place, and time.        Assessment & Plan:   Mylen was seen today for follow-up.  Diagnoses and all orders for this visit:  Radiculopathy of lumbosacral region  Cervical radiculopathy  Essential hypertension  Hypertriglyceridemia  [name]  Other orders -     naproxen (NAPROSYN) 500 MG tablet; Take 1 tablet (500 mg total) by mouth 2 (two) times daily. -     gabapentin (NEURONTIN) 300 MG capsule; 1 at bedtime for1 week then 2  The next  week then 3 the next week then 4 daily.  Gabapentin should help calm down the radiculopathy symptoms.  He should continue as is for lipids and blood pressure as both seems to be stable at this time.  Will defer further work-up until after he completes his colonoscopy in a few weeks due to his [name] and history of transverse colon cancer.  Also no further testing until after neurosurgery completes their evaluation of his cervical and lumbar radiculopathies.     I have discontinued Alcee Sipos pregabalin. I am also having him start on gabapentin. Additionally, I am having him maintain his Fish Oil, atorvastatin, chlorthalidone, gemfibrozil, potassium chloride SA, tiZANidine, and naproxen.  Allergies as of 10/27/2020      Reactions   Adhesive [tape] Rash   Prefers paper tape      Medication List       Accurate as of October 27, 2020  1:51 PM. If you have any questions, ask your nurse or doctor.        STOP taking these medications   pregabalin 75 MG capsule Commonly known as: LYRICA Stopped by: Claretta Fraise, MD     TAKE these medications   atorvastatin 40 MG tablet Commonly known as: LIPITOR Take 1 tablet (40 mg total) by mouth daily.   chlorthalidone 25 MG tablet Commonly known as: HYGROTON Take 1 tablet (25 mg total) by mouth daily.   Fish Oil 1000 MG Cpdr Take 2 capsules by mouth daily.   gabapentin 300 MG capsule Commonly known as: NEURONTIN 1 at bedtime for1 week then 2  The next  week then 3 the next week then 4  daily. Started by: Claretta Fraise, MD   gemfibrozil 600 MG tablet Commonly known as: LOPID TAKE  (1)  TABLET TWICE A DAY WITH MEALS (BREAKFAST AND SUPPER) What changed:   how much to take  how to take this  when to take this   naproxen 500 MG tablet Commonly known as: NAPROSYN Take 1 tablet (500 mg total) by mouth 2 (two) times daily.   potassium chloride SA 20 MEQ tablet Commonly known as: Klor-Con M20 Take 1 tablet (20 mEq total) by mouth 3 (three) times daily.   tiZANidine 4 MG tablet Commonly  known as: ZANAFLEX TAKE 1 TABLET THREE TIMES DAILY AS NEEDED FOR SPASMS        Follow-up: Return in about 6 months (around 04/26/2021).  Claretta Fraise, M.D.

## 2020-11-05 ENCOUNTER — Other Ambulatory Visit: Payer: Self-pay | Admitting: Family Medicine

## 2020-11-05 DIAGNOSIS — M542 Cervicalgia: Secondary | ICD-10-CM

## 2020-11-07 ENCOUNTER — Other Ambulatory Visit: Payer: Self-pay

## 2020-11-07 ENCOUNTER — Emergency Department (HOSPITAL_COMMUNITY): Payer: 59

## 2020-11-07 ENCOUNTER — Inpatient Hospital Stay (HOSPITAL_COMMUNITY)
Admission: EM | Admit: 2020-11-07 | Discharge: 2020-11-11 | DRG: 871 | Disposition: A | Discharge status: Home / Self Care | Payer: 59 | Attending: Internal Medicine | Admitting: Internal Medicine

## 2020-11-07 ENCOUNTER — Encounter (HOSPITAL_COMMUNITY): Payer: Self-pay

## 2020-11-07 DIAGNOSIS — R6520 Severe sepsis without septic shock: Secondary | ICD-10-CM | POA: Diagnosis present

## 2020-11-07 DIAGNOSIS — A409 Streptococcal sepsis, unspecified: Principal | ICD-10-CM | POA: Diagnosis present

## 2020-11-07 DIAGNOSIS — E871 Hypo-osmolality and hyponatremia: Secondary | ICD-10-CM | POA: Diagnosis present

## 2020-11-07 DIAGNOSIS — F17210 Nicotine dependence, cigarettes, uncomplicated: Secondary | ICD-10-CM | POA: Diagnosis present

## 2020-11-07 DIAGNOSIS — E785 Hyperlipidemia, unspecified: Secondary | ICD-10-CM | POA: Diagnosis present

## 2020-11-07 DIAGNOSIS — E781 Pure hyperglyceridemia: Secondary | ICD-10-CM | POA: Diagnosis present

## 2020-11-07 DIAGNOSIS — Z800 Family history of malignant neoplasm of digestive organs: Secondary | ICD-10-CM

## 2020-11-07 DIAGNOSIS — J9601 Acute respiratory failure with hypoxia: Secondary | ICD-10-CM | POA: Diagnosis present

## 2020-11-07 DIAGNOSIS — Z20822 Contact with and (suspected) exposure to covid-19: Secondary | ICD-10-CM | POA: Diagnosis present

## 2020-11-07 DIAGNOSIS — J189 Pneumonia, unspecified organism: Secondary | ICD-10-CM

## 2020-11-07 DIAGNOSIS — R7989 Other specified abnormal findings of blood chemistry: Secondary | ICD-10-CM

## 2020-11-07 DIAGNOSIS — E876 Hypokalemia: Secondary | ICD-10-CM | POA: Diagnosis present

## 2020-11-07 DIAGNOSIS — R042 Hemoptysis: Secondary | ICD-10-CM | POA: Diagnosis present

## 2020-11-07 DIAGNOSIS — F32A Depression, unspecified: Secondary | ICD-10-CM | POA: Diagnosis present

## 2020-11-07 DIAGNOSIS — E782 Mixed hyperlipidemia: Secondary | ICD-10-CM | POA: Diagnosis not present

## 2020-11-07 DIAGNOSIS — A419 Sepsis, unspecified organism: Secondary | ICD-10-CM | POA: Diagnosis not present

## 2020-11-07 DIAGNOSIS — Z720 Tobacco use: Secondary | ICD-10-CM | POA: Diagnosis not present

## 2020-11-07 DIAGNOSIS — Z888 Allergy status to other drugs, medicaments and biological substances status: Secondary | ICD-10-CM | POA: Diagnosis not present

## 2020-11-07 DIAGNOSIS — J439 Emphysema, unspecified: Secondary | ICD-10-CM | POA: Diagnosis present

## 2020-11-07 DIAGNOSIS — R0602 Shortness of breath: Secondary | ICD-10-CM | POA: Diagnosis not present

## 2020-11-07 DIAGNOSIS — Z85038 Personal history of other malignant neoplasm of large intestine: Secondary | ICD-10-CM

## 2020-11-07 DIAGNOSIS — Z9049 Acquired absence of other specified parts of digestive tract: Secondary | ICD-10-CM | POA: Diagnosis not present

## 2020-11-07 DIAGNOSIS — A403 Sepsis due to Streptococcus pneumoniae: Secondary | ICD-10-CM | POA: Diagnosis not present

## 2020-11-07 DIAGNOSIS — I10 Essential (primary) hypertension: Secondary | ICD-10-CM | POA: Diagnosis present

## 2020-11-07 DIAGNOSIS — K219 Gastro-esophageal reflux disease without esophagitis: Secondary | ICD-10-CM | POA: Diagnosis present

## 2020-11-07 HISTORY — DX: Pneumonia, unspecified organism: J18.9

## 2020-11-07 HISTORY — DX: Sepsis, unspecified organism: A41.9

## 2020-11-07 LAB — CBC
HCT: 50.4 % (ref 39.0–52.0)
Hemoglobin: 17.4 g/dL — ABNORMAL HIGH (ref 13.0–17.0)
MCH: 35.3 pg — ABNORMAL HIGH (ref 26.0–34.0)
MCHC: 34.5 g/dL (ref 30.0–36.0)
MCV: 102.2 fL — ABNORMAL HIGH (ref 80.0–100.0)
Platelets: 170 10*3/uL (ref 150–400)
RBC: 4.93 MIL/uL (ref 4.22–5.81)
RDW: 13.1 % (ref 11.5–15.5)
WBC: 15.7 10*3/uL — ABNORMAL HIGH (ref 4.0–10.5)
nRBC: 0 % (ref 0.0–0.2)

## 2020-11-07 LAB — HEPATIC FUNCTION PANEL
ALT: 33 U/L (ref 0–44)
AST: 45 U/L — ABNORMAL HIGH (ref 15–41)
Albumin: 3.7 g/dL (ref 3.5–5.0)
Alkaline Phosphatase: 57 U/L (ref 38–126)
Bilirubin, Direct: 0.4 mg/dL — ABNORMAL HIGH (ref 0.0–0.2)
Indirect Bilirubin: 1.4 mg/dL — ABNORMAL HIGH (ref 0.3–0.9)
Total Bilirubin: 1.8 mg/dL — ABNORMAL HIGH (ref 0.3–1.2)
Total Protein: 7.6 g/dL (ref 6.5–8.1)

## 2020-11-07 LAB — MAGNESIUM: Magnesium: 1.5 mg/dL — ABNORMAL LOW (ref 1.7–2.4)

## 2020-11-07 LAB — RESP PANEL BY RT-PCR (FLU A&B, COVID) ARPGX2
Influenza A by PCR: NEGATIVE
Influenza B by PCR: NEGATIVE
SARS Coronavirus 2 by RT PCR: NEGATIVE

## 2020-11-07 LAB — BASIC METABOLIC PANEL
Anion gap: 15 (ref 5–15)
BUN: 16 mg/dL (ref 6–20)
CO2: 25 mmol/L (ref 22–32)
Calcium: 9.3 mg/dL (ref 8.9–10.3)
Chloride: 92 mmol/L — ABNORMAL LOW (ref 98–111)
Creatinine, Ser: 0.95 mg/dL (ref 0.61–1.24)
GFR, Estimated: >60 mL/min (ref >60)
Glucose, Bld: 107 mg/dL — ABNORMAL HIGH (ref 70–99)
Potassium: 3.3 mmol/L — ABNORMAL LOW (ref 3.5–5.1)
Sodium: 132 mmol/L — ABNORMAL LOW (ref 135–145)

## 2020-11-07 LAB — TROPONIN I (HIGH SENSITIVITY)
Troponin I (High Sensitivity): 5 ng/L (ref <18)
Troponin I (High Sensitivity): 8 ng/L (ref <18)

## 2020-11-07 LAB — LIPASE, BLOOD: Lipase: 25 U/L (ref 11–51)

## 2020-11-07 MED ORDER — LACTATED RINGERS IV BOLUS
1000.0000 mL | Freq: Once | INTRAVENOUS | Status: AC
  Administered 2020-11-07: 1000 mL via INTRAVENOUS

## 2020-11-07 MED ORDER — NICOTINE 21 MG/24HR TD PT24
21.0000 mg | MEDICATED_PATCH | Freq: Every day | TRANSDERMAL | Status: DC
  Administered 2020-11-07 – 2020-11-11 (×5): 21 mg via TRANSDERMAL
  Filled 2020-11-07 (×6): qty 1

## 2020-11-07 MED ORDER — SODIUM CHLORIDE (PF) 0.9 % IJ SOLN
INTRAMUSCULAR | Status: AC
  Filled 2020-11-07: qty 50

## 2020-11-07 MED ORDER — ONDANSETRON HCL 4 MG/2ML IJ SOLN
INTRAMUSCULAR | Status: AC
  Administered 2020-11-07: 4 mg via INTRAVENOUS
  Filled 2020-11-07: qty 2

## 2020-11-07 MED ORDER — SODIUM CHLORIDE 0.9 % IV SOLN
500.0000 mg | INTRAVENOUS | Status: DC
  Administered 2020-11-08: 500 mg via INTRAVENOUS
  Filled 2020-11-07: qty 500

## 2020-11-07 MED ORDER — ACETAMINOPHEN 325 MG PO TABS
650.0000 mg | ORAL_TABLET | Freq: Four times a day (QID) | ORAL | Status: DC | PRN
  Administered 2020-11-08 – 2020-11-11 (×6): 650 mg via ORAL
  Filled 2020-11-07 (×6): qty 2

## 2020-11-07 MED ORDER — SODIUM CHLORIDE 0.9 % IV SOLN
500.0000 mg | Freq: Once | INTRAVENOUS | Status: AC
  Administered 2020-11-07: 500 mg via INTRAVENOUS
  Filled 2020-11-07: qty 500

## 2020-11-07 MED ORDER — SODIUM CHLORIDE 0.9 % IV SOLN
1.0000 g | Freq: Once | INTRAVENOUS | Status: AC
  Administered 2020-11-07: 1 g via INTRAVENOUS
  Filled 2020-11-07: qty 10

## 2020-11-07 MED ORDER — IOHEXOL 350 MG/ML SOLN
75.0000 mL | Freq: Once | INTRAVENOUS | Status: AC | PRN
  Administered 2020-11-07: 75 mL via INTRAVENOUS

## 2020-11-07 MED ORDER — POTASSIUM CHLORIDE CRYS ER 20 MEQ PO TBCR
40.0000 meq | EXTENDED_RELEASE_TABLET | Freq: Once | ORAL | Status: AC
  Administered 2020-11-07: 40 meq via ORAL
  Filled 2020-11-07: qty 2

## 2020-11-07 MED ORDER — ONDANSETRON HCL 4 MG/2ML IJ SOLN: 4.0000 mg | Freq: Once | INTRAMUSCULAR | Status: AC

## 2020-11-07 MED ORDER — SODIUM CHLORIDE 0.9 % IV SOLN
1.0000 g | INTRAVENOUS | Status: DC
  Administered 2020-11-08 – 2020-11-09 (×2): 1 g via INTRAVENOUS
  Filled 2020-11-07: qty 10
  Filled 2020-11-07: qty 1

## 2020-11-07 MED ORDER — MORPHINE SULFATE (PF) 4 MG/ML IV SOLN
4.0000 mg | Freq: Once | INTRAVENOUS | Status: AC
  Administered 2020-11-07: 4 mg via INTRAVENOUS
  Filled 2020-11-07: qty 1

## 2020-11-07 NOTE — ED Notes (Signed)
Pt began vomiting. MD aware. VO for Zofran 4mg  IV obtained.

## 2020-11-07 NOTE — ED Notes (Signed)
Still with registration

## 2020-11-07 NOTE — Progress Notes (Signed)
   11/07/20 2332  Assess: MEWS Score  Temp (!) 101.8 F (38.8 C)  BP 117/71  Pulse Rate (!) 124  Resp (!) 36  SpO2 99 %  Assess: MEWS Score  MEWS Temp 2  MEWS Systolic 0  MEWS Pulse 2  MEWS RR 3  MEWS LOC 0  MEWS Score 7  MEWS Score Color Red  Assess: if the MEWS score is Yellow or Red  Were vital signs taken at a resting state? Yes  Focused Assessment No change from prior assessment  Early Detection of Sepsis Score *See Row Information* High  MEWS guidelines implemented *See Row Information* No, vital signs rechecked  Treat  MEWS Interventions Administered scheduled meds/treatments  Pain Scale 0-10  Pain Score 0  Take Vital Signs  Increase Vital Sign Frequency  Red: Q 1hr X 4 then Q 4hr X 4, if remains red, continue Q 4hrs  Escalate  MEWS: Escalate Red: discuss with charge nurse/RN and provider, consider discussing with RRT  Notify: Charge Nurse/RN  Name of Charge Nurse/RN Notified Mechele Claude  Date Charge Nurse/RN Notified 11/07/20  Time Charge Nurse/RN Notified 2345  Notify: Provider  Provider Name/Title Hospitalist  Date Provider Notified 11/07/20  Time Provider Notified 2345  Notification Type Page  Notification Reason Change in status  Response No new orders  1429-Alcorta, s. pt NEWS is red. tachypneic and fever 101.8, thanks Abby

## 2020-11-07 NOTE — Plan of Care (Signed)
  Problem: Education: Goal: Ability to demonstrate management of disease process will improve Outcome: Progressing Goal: Ability to verbalize understanding of medication therapies will improve Outcome: Progressing Goal: Individualized Educational Video(s) Outcome: Progressing   Problem: Activity: Goal: Capacity to carry out activities will improve Outcome: Progressing   Problem: Cardiac: Goal: Ability to achieve and maintain adequate cardiopulmonary perfusion will improve Outcome: Progressing   Problem: Education: Goal: Knowledge of General Education information will improve Description: Including pain rating scale, medication(s)/side effects and non-pharmacologic comfort measures Outcome: Progressing   Problem: Health Behavior/Discharge Planning: Goal: Ability to manage health-related needs will improve Outcome: Progressing   Problem: Clinical Measurements: Goal: Ability to maintain clinical measurements within normal limits will improve Outcome: Progressing Goal: Will remain free from infection Outcome: Progressing Goal: Diagnostic test results will improve Outcome: Progressing Goal: Respiratory complications will improve Outcome: Progressing Goal: Cardiovascular complication will be avoided Outcome: Progressing   Problem: Activity: Goal: Risk for activity intolerance will decrease Outcome: Progressing   Problem: Nutrition: Goal: Adequate nutrition will be maintained Outcome: Progressing   Problem: Coping: Goal: Level of anxiety will decrease Outcome: Progressing   Problem: Elimination: Goal: Will not experience complications related to bowel motility Outcome: Progressing Goal: Will not experience complications related to urinary retention Outcome: Progressing   Problem: Pain Managment: Goal: General experience of comfort will improve Outcome: Progressing   Problem: Safety: Goal: Ability to remain free from injury will improve Outcome: Progressing    Problem: Skin Integrity: Goal: Risk for impaired skin integrity will decrease Outcome: Progressing   Problem: Education: Goal: Knowledge of disease or condition will improve Outcome: Progressing Goal: Knowledge of the prescribed therapeutic regimen will improve Outcome: Progressing Goal: Individualized Educational Video(s) Outcome: Progressing   Problem: Activity: Goal: Ability to tolerate increased activity will improve Outcome: Progressing Goal: Will verbalize the importance of balancing activity with adequate rest periods Outcome: Progressing   Problem: Respiratory: Goal: Ability to maintain a clear airway will improve Outcome: Progressing Goal: Levels of oxygenation will improve Outcome: Progressing Goal: Ability to maintain adequate ventilation will improve Outcome: Progressing

## 2020-11-07 NOTE — ED Triage Notes (Signed)
Patient c/o SOB, chest pain worse with a deep breath, generalized body aches, and hemoptysis. Patient states he has been having a productive cough with a "medium amount of dark red blood and too many too count." since this AM.

## 2020-11-07 NOTE — ED Provider Notes (Signed)
[facility] DEPT Provider Note   CSN: [id number] Arrival date & time: 11/07/20  1541     History Chief Complaint  Patient presents with  . Shortness of Breath  . Chest Pain  . Generalized Body Aches  . Hemoptysis    Charles Santos is a 49 y.o. male.   Shortness of Breath Severity:  Moderate Onset quality:  Gradual Timing:  Constant Progression:  Worsening Chronicity:  New Relieved by:  Nothing Worsened by:  Deep breathing Ineffective treatments:  None tried Associated symptoms: chest pain, cough, fever, hemoptysis and sputum production   Associated symptoms: no headaches, no rash, no syncope and no vomiting   Risk factors: hx of cancer   Chest Pain Associated symptoms: cough, fever and shortness of breath   Associated symptoms: no back pain, no headache, no nausea, no palpitations, no syncope and no vomiting        Past Medical History:  Diagnosis Date  . Anxiety   . Cancer of transverse colon ([place]) 09/17/2016   surgery done  . Colon cancer [facility]) 1998   surgery and chemo  . Complication of anesthesia    not fully under for polyp removal at [place] 2017  . Depression   . Emphysema of lung ([place])   . Family hx of colon cancer   . GERD (gastroesophageal reflux disease)    none recent  . Headache   . Hyperlipidemia   . Hypertension   . Vitamin D deficiency     Patient Active Problem List   Diagnosis Date Noted  . CAP (community acquired pneumonia) 11/07/2020  . Acute hypoxemic respiratory failure ([place]) 11/07/2020  . Sepsis ([place]) 11/07/2020  . Hyponatremia 11/07/2020  . Hypokalemia 11/07/2020  . Acute midline low back pain with bilateral sciatica 05/16/2020  . Foraminal stenosis of lumbar region 12/28/2019  . Lumbar spondylolysis 12/28/2019  . Radiculopathy of lumbosacral region 12/05/2019  . Essential hypertension 09/26/2019  . Chronic right-sided low back pain with right-sided sciatica 09/26/2019  . Recurrent right  inguinal hernia 06/26/2019  . Hypertriglyceridemia 11/24/2018  . Anxiety 02/16/2018  . Elevated CEA   . [name]   . Cancer of transverse colon ([place]) 09/17/2016  . S/P colonoscopic polypectomy   . Hematochezia   . Depression 06/01/2016  . Alcohol abuse, in remission 06/01/2016  . Tobacco abuse 06/01/2016  . Vitamin D deficiency 10/17/2015  . Hyperlipidemia 10/17/2015  . History of malignant neoplasm of colon without staging 09/22/2010    Past Surgical History:  Procedure Laterality Date  . APPENDECTOMY    . colon resectomy  1998   2 degree herida  . COLONOSCOPY    . ESOPHAGOGASTRODUODENOSCOPY (EGD) WITH PROPOFOL N/A 05/19/2017   Procedure: ESOPHAGOGASTRODUODENOSCOPY (EGD) WITH PROPOFOL;  Surgeon: [name], MD;  Location: [facility] ENDOSCOPY;  Service: Endoscopy;  Laterality: N/A;  . FLEXIBLE SIGMOIDOSCOPY N/A 05/19/2017   Procedure: FLEXIBLE SIGMOIDOSCOPY;  Surgeon: [name], MD;  Location: [facility] ENDOSCOPY;  Service: Endoscopy;  Laterality: N/A;  . INGUINAL HERNIA REPAIR    . LAPAROSCOPIC SUBTOTAL COLECTOMY N/A 09/17/2016   Procedure: DIAGNOSTIC LAPAROSCOPY EXPLORATORY LAPAROTOMY SUBTOTAL COLECTOMY PROCTOSCOPY;  Surgeon: [name], MD;  Location: [facility] ORS;  Service: General;  Laterality: N/A;  . polyp removal at [facility]  2017  . PROCTOSCOPY  09/17/2016   Procedure: PROCTOSCOPY;  Surgeon: [name], MD;  Location: [facility] ORS;  Service: General;;  . UPPER GI ENDOSCOPY         Family History  Problem Relation Age of Onset  . Colon cancer Father        pt thinks he was in his 79's when dx  . Esophageal cancer Neg Hx   . Rectal cancer Neg Hx   . Stomach cancer Neg Hx   . Prostate cancer Neg Hx     Social History   Tobacco Use  . Smoking status: Current Every Day Smoker    Packs/day: 1.00    Years: 23.00    Pack years: 23.00    Types: Cigarettes  . Smokeless tobacco: Never Used  Vaping Use  . Vaping Use: Never used  Substance Use Topics  . Alcohol  use: Yes    Alcohol/week: 12.0 standard drinks    Types: 12 Cans of beer per week    Comment: occ  . Drug use: No    Home Medications Prior to Admission medications   Medication Sig Start Date End Date Taking? Authorizing Provider  atorvastatin (LIPITOR) 40 MG tablet Take 1 tablet (40 mg total) by mouth daily. 03/21/20  Yes Rakes, Connye Burkitt, FNP  chlorthalidone (HYGROTON) 25 MG tablet Take 1 tablet (25 mg total) by mouth daily. 03/21/20  Yes Rakes, Connye Burkitt, FNP  gabapentin (NEURONTIN) 300 MG capsule 1 at bedtime for1 week then 2  The next  week then 3 the next week then 4 daily. 10/27/20  Yes Stacks, Cletus Gash, MD  gemfibrozil (LOPID) 600 MG tablet TAKE  (1)  TABLET TWICE A DAY WITH MEALS (BREAKFAST AND SUPPER) Patient taking differently: Take 600 mg by mouth 2 (two) times daily before a meal. TAKE  (1)  TABLET TWICE A DAY WITH MEALS (BREAKFAST AND SUPPER) 03/21/20  Yes Rakes, Connye Burkitt, FNP  naproxen (NAPROSYN) 500 MG tablet Take 1 tablet (500 mg total) by mouth 2 (two) times daily. 10/27/20  Yes Stacks, Cletus Gash, MD  Omega-3 Fatty Acids (FISH OIL) 1000 MG CPDR Take 1 capsule by mouth daily.    Yes [provider]  potassium chloride SA (KLOR-CON M20) 20 MEQ tablet Take 1 tablet (20 mEq total) by mouth 3 (three) times daily. 07/25/20  Yes Stacks, Cletus Gash, MD  tiZANidine (ZANAFLEX) 4 MG tablet TAKE 1 TABLET THREE TIMES DAILY AS NEEDED FOR SPASMS Patient not taking: Reported on 11/07/2020 11/05/20   Claretta Fraise, MD    Allergies    Adhesive [tape]  Review of Systems   Review of Systems  Constitutional: Positive for fever. Negative for chills.  HENT: Negative for congestion and rhinorrhea.   Respiratory: Positive for cough, hemoptysis, sputum production and shortness of breath.   Cardiovascular: Positive for chest pain. Negative for palpitations, leg swelling and syncope.  Gastrointestinal: Negative for diarrhea, nausea and vomiting.  Genitourinary: Negative for difficulty urinating and  dysuria.  Musculoskeletal: Negative for arthralgias and back pain.  Skin: Negative for color change and rash.  Neurological: Negative for light-headedness and headaches.    Physical Exam Updated Vital Signs BP 125/77 (BP Location: Right Arm)   Pulse (!) 122   Temp 99.9 F (37.7 C) (Oral)   Resp (!) 30   Ht 5\' 10"  (1.778 m)   Wt 98 kg   SpO2 98%   BMI 30.99 kg/m   Physical Exam Vitals and nursing note reviewed.  Constitutional:      General: He is not in acute distress.    Appearance: Normal appearance.  HENT:     Head: Normocephalic and atraumatic.     Nose: No rhinorrhea.  Eyes:  General:        Right eye: No discharge.        Left eye: No discharge.     Conjunctiva/sclera: Conjunctivae normal.  Cardiovascular:     Rate and Rhythm: Regular rhythm. Tachycardia present.  Pulmonary:     Effort: Pulmonary effort is normal.     Breath sounds: No stridor. No wheezing.  Abdominal:     General: Abdomen is flat. There is no distension.     Palpations: Abdomen is soft.  Musculoskeletal:        General: No deformity or signs of injury.     Right lower leg: No tenderness.     Left lower leg: No tenderness.  Skin:    General: Skin is warm and dry.  Neurological:     General: No focal deficit present.     Mental Status: He is alert. Mental status is at baseline.     Motor: No weakness.  Psychiatric:        Mood and Affect: Mood normal.        Behavior: Behavior normal.        Thought Content: Thought content normal.     ED Results / Procedures / Treatments   Labs (all labs ordered are listed, but only abnormal results are displayed) Labs Reviewed  BASIC METABOLIC PANEL - Abnormal; Notable for the following components:      Result Value   Sodium 132 (*)    Potassium 3.3 (*)    Chloride 92 (*)    Glucose, Bld 107 (*)    All other components within normal limits  CBC - Abnormal; Notable for the following components:   WBC 15.7 (*)    Hemoglobin 17.4 (*)     MCV 102.2 (*)    MCH 35.3 (*)    All other components within normal limits  HEPATIC FUNCTION PANEL - Abnormal; Notable for the following components:   AST 45 (*)    Total Bilirubin 1.8 (*)    Bilirubin, Direct 0.4 (*)    Indirect Bilirubin 1.4 (*)    All other components within normal limits  MAGNESIUM - Abnormal; Notable for the following components:   Magnesium 1.5 (*)    All other components within normal limits  RESP PANEL BY RT-PCR (FLU A&B, COVID) ARPGX2  CULTURE, BLOOD (ROUTINE X 2)  CULTURE, BLOOD (ROUTINE X 2)  LIPASE, BLOOD  HIV ANTIBODY (ROUTINE TESTING W REFLEX)  CBC  BASIC METABOLIC PANEL  TROPONIN I (HIGH SENSITIVITY)  TROPONIN I (HIGH SENSITIVITY)    EKG EKG Interpretation  Date/Time:  Friday November 07 2020 15:52:58 EST Ventricular Rate:  123 PR Interval:    QRS Duration: 110 QT Interval:  302 QTC Calculation: 432 R Axis:   104 Text Interpretation: Sinus tachycardia Probable right ventricular hypertrophy 12 Lead; [place] Confirmed by Dewaine Conger [phone]) on 11/07/2020 4:19:44 PM   Radiology CT Angio Chest PE W and/or Wo Contrast  Result Date: 11/07/2020 CLINICAL DATA:  49 year old male with concern for pulmonary embolism. EXAM: CT ANGIOGRAPHY CHEST WITH CONTRAST TECHNIQUE: Multidetector CT imaging of the chest was performed using the standard protocol during bolus administration of intravenous contrast. Multiplanar CT image reconstructions and MIPs were obtained to evaluate the vascular anatomy. CONTRAST:  61mL OMNIPAQUE IOHEXOL 350 MG/ML SOLN COMPARISON:  None. FINDINGS: Evaluation of this exam is limited due to respiratory motion artifact. Cardiovascular: There is no cardiomegaly or pericardial effusion. The thoracic aorta is unremarkable. Evaluation of the pulmonary arteries is very limited  due to respiratory motion artifact and suboptimal opacification. No definite large central pulmonary artery embolus identified. Mediastinum/Nodes: Top-normal right  infrahilar or subcarinal lymph nodes measuring 10 mm (142/5). The esophagus and the thyroid gland are grossly unremarkable. No mediastinal fluid collection. Lungs/Pleura: Patchy area of consolidation in the right lower lobe most consistent with pneumonia. Clinical correlation and follow-up to resolution recommended. The left lung is clear. There is no pleural effusion or pneumothorax. Upper Abdomen: No acute abnormality. Musculoskeletal: No chest wall abnormality. No acute or significant osseous findings. Review of the MIP images confirms the above findings. IMPRESSION: 1. No CT evidence of central pulmonary artery embolus. 2. Right lower lobe pneumonia. Clinical correlation and follow-up to resolution recommended. Electronically Signed   By: Anner Crete M.D.   On: 11/07/2020 19:23    Procedures Procedures (including critical care time)  Medications Ordered in ED Medications  sodium chloride (PF) 0.9 % injection (has no administration in time range)  azithromycin (ZITHROMAX) 500 mg in sodium chloride 0.9 % 250 mL IVPB (500 mg Intravenous New Bag/Given 11/07/20 2245)  azithromycin (ZITHROMAX) 500 mg in sodium chloride 0.9 % 250 mL IVPB (has no administration in time range)  cefTRIAXone (ROCEPHIN) 1 g in sodium chloride 0.9 % 100 mL IVPB (has no administration in time range)  nicotine (NICODERM CQ - dosed in mg/24 hours) patch 21 mg (21 mg Transdermal Patch Applied 11/07/20 2242)  morphine 4 MG/ML injection 4 mg (4 mg Intravenous Given 11/07/20 1629)  lactated ringers bolus 1,000 mL (0 mLs Intravenous Stopped 11/07/20 1900)  ondansetron (ZOFRAN) injection 4 mg (4 mg Intravenous Given 11/07/20 1640)  iohexol (OMNIPAQUE) 350 MG/ML injection 75 mL (75 mLs Intravenous Contrast Given 11/07/20 1846)  cefTRIAXone (ROCEPHIN) 1 g in sodium chloride 0.9 % 100 mL IVPB (0 g Intravenous Stopped 11/07/20 2251)  lactated ringers bolus 1,000 mL (1,000 mLs Intravenous New Bag/Given 11/07/20 2232)  lactated  ringers bolus 1,000 mL (0 mLs Intravenous Stopped 11/07/20 2251)  potassium chloride SA (KLOR-CON) CR tablet 40 mEq (40 mEq Oral Given 11/07/20 2243)    ED Course  I have reviewed the triage vital signs and the nursing notes.  Pertinent labs & imaging results that were available during my care of the patient were reviewed by me and considered in my medical decision making (see chart for details).    MDM Rules/Calculators/A&P                          Cancer, tachycardia, shortness of breath, pleuritic chest pain, hemoptysis.  Needs PE work-up, EKG reviewed by me shows sinus tachycardia without acute ischemic change interval abnormality or arrhythmia, possible [id number].  We will get screening laboratory studies and cardiac biomarkers.  Pain control fluids given.  CT imaging shows no acute PE but does show concerns for right lower lobe pneumonia, consistent with patient's clinical exam.  Antibiotics given.  Hospitalist for admission secondary to patient's tachycardia tachypnea.  Intermittent hypoxia.  Patient agrees to this plan.  Covid negative.   Final Clinical Impression(s) / ED Diagnoses Final diagnoses:  Community acquired pneumonia of right lower lobe of lung    Rx / DC Orders ED Discharge Orders    None       Breck Coons, MD 11/07/20 2325

## 2020-11-07 NOTE — H&P (Addendum)
History and Physical    Anis Degidio JKD:[id number] DOB: [birth year] DOA: 11/07/2020  PCP: Claretta Fraise, MD Patient coming from: Home  Chief Complaint: Shortness of breath, pleuritic chest pain, hemoptysis  HPI: Charles Santos is a 49 y.o. male with medical history significant of colon cancer status post chemo and subtotal colectomy, emphysema, hypertension, hyperlipidemia, GERD, anxiety, depression, tobacco use presenting to the ED with complaints of shortness of breath, pleuritic chest pain, and hemoptysis.  Patient states he has been feeling bad for the past 1 day.  He is experiencing right-sided pleuritic chest pain whenever he takes deep breaths.  He is coughing a lot and cough is productive of blood-tinged sputum.  Denies fevers or chills.  States he vomited once earlier today after eating something and then vomited again after he received morphine in the ED.  Reports having chronic diarrhea/loose stools since he had colectomy done.  Denies abdominal pain.  No other complaints.  ED Course: Afebrile.  Tachycardic and tachypneic.  Not hypotensive.  SPO2 89% on room air, improved with 2 L supplemental oxygen.  WBC 15.7, hemoglobin 17.4, hematocrit 50.4, platelet 170K.  Sodium 132, potassium 3.3, chloride 92, bicarb 25, BUN 16, creatinine 0.9, glucose 107.  High-sensitivity troponin negative x2.  SARS-CoV-2 PCR test negative.  Influenza panel negative.  Blood culture pending.    CT angiogram chest negative for PE and showing right lower lobe consolidation most consistent with pneumonia.  Patient received ceftriaxone, azithromycin, 2 L LR boluses, morphine, and Zofran.  Review of Systems:  All systems reviewed and apart from history of presenting illness, are negative.  Past Medical History:  Diagnosis Date  . Anxiety   . Cancer of transverse colon ([place]) 09/17/2016   surgery done  . Colon cancer [facility]) 1998   surgery and chemo  . Complication of anesthesia    not fully under for polyp  removal at [place] 2017  . Depression   . Emphysema of lung ([place])   . Family hx of colon cancer   . GERD (gastroesophageal reflux disease)    none recent  . Headache   . Hyperlipidemia   . Hypertension   . Vitamin D deficiency     Past Surgical History:  Procedure Laterality Date  . APPENDECTOMY    . colon resectomy  1998   2 degree herida  . COLONOSCOPY    . ESOPHAGOGASTRODUODENOSCOPY (EGD) WITH PROPOFOL N/A 05/19/2017   Procedure: ESOPHAGOGASTRODUODENOSCOPY (EGD) WITH PROPOFOL;  Surgeon: [name], MD;  Location: [facility] END[facility];  Service: Endoscopy;  Laterality: N/A;  . FLEXIBLE SIGMOIDOSCOPY N/A 05/19/2017   Procedure: FLEXIBLE SIGMOIDOSCOPY;  Surgeon: [name], MD;  Location: [facility] ENDOSCOPY;  Service: Endoscopy;  Laterality: N/A;  . INGUINAL HERNIA REPAIR    . LAPAROSCOPIC SUBTOTAL COLECTOMY N/A 09/17/2016   Procedure: DIAGNOSTIC LAPAROSCOPY EXPLORATORY LAPAROTOMY SUBTOTAL COLECTOMY PROCTOSCOPY;  Surgeon: [name], MD;  Location: [facility] ORS;  Service: General;  Laterality: N/A;  . polyp removal at [place]  2017  . PROCTOSCOPY  09/17/2016   Procedure: PROCTOSCOPY;  Surgeon: [name], MD;  Location: [facility] ORS;  Service: General;;  . UPPER GI ENDOSCOPY       reports that he has been smoking cigarettes. He has a 23.00 pack-year smoking history. He has never used smokeless tobacco. He reports current alcohol use of about 12.0 standard drinks of alcohol per week. He reports that he does not use drugs.  Allergies  Allergen Reactions  . Adhesive [Tape] Rash and Other (See  Comments)    Prefers paper tape    Family History  Problem Relation Age of Onset  . Colon cancer Father        pt thinks he was in his 1's when dx  . Esophageal cancer Neg Hx   . Rectal cancer Neg Hx   . Stomach cancer Neg Hx   . Prostate cancer Neg Hx     Prior to Admission medications   Medication Sig Start Date End Date Taking? Authorizing Provider  atorvastatin (LIPITOR) 40 MG  tablet Take 1 tablet (40 mg total) by mouth daily. 03/21/20   Baruch Gouty, FNP  chlorthalidone (HYGROTON) 25 MG tablet Take 1 tablet (25 mg total) by mouth daily. 03/21/20   Baruch Gouty, FNP  gabapentin (NEURONTIN) 300 MG capsule 1 at bedtime for1 week then 2  The next  week then 3 the next week then 4 daily. 10/27/20   Claretta Fraise, MD  gemfibrozil (LOPID) 600 MG tablet TAKE  (1)  TABLET TWICE A DAY WITH MEALS (BREAKFAST AND SUPPER) Patient taking differently: Take 600 mg by mouth 2 (two) times daily before a meal. TAKE  (1)  TABLET TWICE A DAY WITH MEALS (BREAKFAST AND SUPPER) 03/21/20   Rakes, Connye Burkitt, FNP  naproxen (NAPROSYN) 500 MG tablet Take 1 tablet (500 mg total) by mouth 2 (two) times daily. 10/27/20   Claretta Fraise, MD  Omega-3 Fatty Acids (FISH OIL) 1000 MG CPDR Take 2 capsules by mouth daily.    [provider]  potassium chloride SA (KLOR-CON M20) 20 MEQ tablet Take 1 tablet (20 mEq total) by mouth 3 (three) times daily. 07/25/20   Claretta Fraise, MD  tiZANidine (ZANAFLEX) 4 MG tablet TAKE 1 TABLET THREE TIMES DAILY AS NEEDED FOR SPASMS Patient taking differently: Take 4 mg by mouth 3 (three) times daily as needed for muscle spasms.  11/05/20   Claretta Fraise, MD    Physical Exam: Vitals:   11/07/20 1558 11/07/20 1731 11/07/20 1840 11/07/20 1945  BP:  127/74 133/73 (!) 140/109  Pulse:  (!) 116 (!) 117 (!) 118  Resp:  (!) 34 (!) 29 (!) 34  Temp:      TempSrc:      SpO2:  96% 100% 95%  Weight: 98 kg     Height: 5\' 10"  (1.778 m)       Physical Exam Constitutional:      Appearance: He is ill-appearing.  HENT:     Head: Normocephalic and atraumatic.     Mouth/Throat:     Mouth: Mucous membranes are moist.     Pharynx: Oropharynx is clear.  Eyes:     Extraocular Movements: Extraocular movements intact.     Conjunctiva/sclera: Conjunctivae normal.  Cardiovascular:     Rate and Rhythm: Normal rate and regular rhythm.     Pulses: Normal pulses.  Pulmonary:      Effort: No respiratory distress.     Breath sounds: Rhonchi present. No wheezing or rales.     Comments: Tachycardia with respiratory rate up to 30 Abdominal:     General: Bowel sounds are normal. There is no distension.     Palpations: Abdomen is soft.     Tenderness: There is no abdominal tenderness. There is no guarding or rebound.  Musculoskeletal:        General: No swelling or tenderness.     Cervical back: Normal range of motion and neck supple.  Skin:    General: Skin is warm and dry.  Neurological:     General: No focal deficit present.     Mental Status: He is alert and oriented to person, place, and time.     Labs on Admission: I have personally reviewed following labs and imaging studies  CBC: Recent Labs  Lab 11/07/20 1629  WBC 15.7*  HGB 17.4*  HCT 50.4  MCV 102.2*  PLT 025   Basic Metabolic Panel: Recent Labs  Lab 11/07/20 1629  NA 132*  K 3.3*  CL 92*  CO2 25  GLUCOSE 107*  BUN 16  CREATININE 0.95  CALCIUM 9.3   GFR: Estimated Creatinine Clearance: 111.6 mL/min (by C-G formula based on SCr of 0.95 mg/dL). Liver Function Tests: No results for input(s): AST, ALT, ALKPHOS, BILITOT, PROT, ALBUMIN in the last 168 hours. No results for input(s): LIPASE, AMYLASE in the last 168 hours. No results for input(s): AMMONIA in the last 168 hours. Coagulation Profile: No results for input(s): INR, PROTIME in the last 168 hours. Cardiac Enzymes: No results for input(s): CKTOTAL, CKMB, CKMBINDEX, TROPONINI in the last 168 hours. BNP (last 3 results) No results for input(s): PROBNP in the last 8760 hours. HbA1C: No results for input(s): HGBA1C in the last 72 hours. CBG: No results for input(s): GLUCAP in the last 168 hours. Lipid Profile: No results for input(s): CHOL, HDL, LDLCALC, TRIG, CHOLHDL, LDLDIRECT in the last 72 hours. Thyroid Function Tests: No results for input(s): TSH, T4TOTAL, FREET4, T3FREE, THYROIDAB in the last 72 hours. Anemia Panel: No  results for input(s): VITAMINB12, FOLATE, FERRITIN, TIBC, IRON, RETICCTPCT in the last 72 hours. Urine analysis: No results found for: COLORURINE, APPEARANCEUR, LABSPEC, PHURINE, GLUCOSEU, HGBUR, BILIRUBINUR, KETONESUR, PROTEINUR, UROBILINOGEN, NITRITE, LEUKOCYTESUR  Radiological Exams on Admission: CT Angio Chest PE W and/or Wo Contrast  Result Date: 11/07/2020 CLINICAL DATA:  49 year old male with concern for pulmonary embolism. EXAM: CT ANGIOGRAPHY CHEST WITH CONTRAST TECHNIQUE: Multidetector CT imaging of the chest was performed using the standard protocol during bolus administration of intravenous contrast. Multiplanar CT image reconstructions and MIPs were obtained to evaluate the vascular anatomy. CONTRAST:  41mL OMNIPAQUE IOHEXOL 350 MG/ML SOLN COMPARISON:  None. FINDINGS: Evaluation of this exam is limited due to respiratory motion artifact. Cardiovascular: There is no cardiomegaly or pericardial effusion. The thoracic aorta is unremarkable. Evaluation of the pulmonary arteries is very limited due to respiratory motion artifact and suboptimal opacification. No definite large central pulmonary artery embolus identified. Mediastinum/Nodes: Top-normal right infrahilar or subcarinal lymph nodes measuring 10 mm (142/5). The esophagus and the thyroid gland are grossly unremarkable. No mediastinal fluid collection. Lungs/Pleura: Patchy area of consolidation in the right lower lobe most consistent with pneumonia. Clinical correlation and follow-up to resolution recommended. The left lung is clear. There is no pleural effusion or pneumothorax. Upper Abdomen: No acute abnormality. Musculoskeletal: No chest wall abnormality. No acute or significant osseous findings. Review of the MIP images confirms the above findings. IMPRESSION: 1. No CT evidence of central pulmonary artery embolus. 2. Right lower lobe pneumonia. Clinical correlation and follow-up to resolution recommended. Electronically Signed   By: Anner Crete M.D.   On: 11/07/2020 19:23    EKG: Independently reviewed.  Sinus tachycardia, nonspecific T wave abnormality in inferior leads.  Assessment/Plan Principal Problem:   CAP (community acquired pneumonia) Active Problems:   Acute hypoxemic respiratory failure (HCC)   Sepsis (HCC)   Hyponatremia   Hypokalemia   Acute hypoxemic respiratory failure Sepsis CAP SPO2 89% on room air, currently requiring 3 L supplemental oxygen.  Meets sepsis criteria - 3 SIRS (tachycardia, tachypnea, leukocytosis), and right lower lobe consolidation on CT angiogram most consistent with pneumonia.  SARS-CoV-2 PCR test negative.  Influenza panel negative. -Continue ceftriaxone and azithromycin.  30 cc/kg fluid boluses per sepsis protocol.  Blood cultures pending.  Check lactic acid level.  Continue to monitor WBC count.  Continuous pulse ox.  Continue supplemental oxygen, wean as tolerated.  Emesis: He had one episode of emesis after eating something earlier today and then another episode after receiving morphine in the ED.  Reports having chronic loose stools/diarrhea due to history of colectomy.  Abdominal exam benign. -Check lipase and LFTs  Addendum: Lipase normal.  T bili slightly elevated at 1.8 (previously normal), no significant elevation of transaminases or alkaline phosphatase.  Will order right upper quadrant ultrasound.   Mild hyponatremia Likely secondary to poor oral intake in the setting of acute illness. -IV fluid hydration and repeat BMP in a.m.  Mild hypokalemia -Replete potassium.  Check magnesium level and replete if low.  Continue to monitor electrolytes.  Hypertension -Hold antihypertensives at this time in the setting of sepsis  Hyperlipidemia Anxiety, depression GERD -Resume home meds after pharmacy med rec is done  Tobacco use Smokes 1 pack of cigarettes daily. -NicoDerm patch and counseling  DVT prophylaxis: SCDs at this time given complaints of hemoptysis Code  Status: Full code Family Communication: No family available at this time. Disposition Plan: Status is: Inpatient  Remains inpatient appropriate because:IV treatments appropriate due to intensity of illness or inability to take PO, Inpatient level of care appropriate due to severity of illness and New supplemental oxygen requirement   Dispo: The patient is from: Home              Anticipated d/c is to: Home              Anticipated d/c date is: 3 days              Patient currently is not medically stable to d/c.  The medical decision making on this patient was of high complexity and the patient is at high risk for clinical deterioration, therefore this is a level 3 visit.  Shela Leff MD [facility]  If 7PM-7AM, please contact night-coverage [email or web address]  11/07/2020, 8:53 PM

## 2020-11-07 NOTE — ED Notes (Signed)
Pt report called to [facility] rn @ [phone]

## 2020-11-07 NOTE — ED Notes (Signed)
Pts RA sat 89%. Pt placed on 2L of O2 via [place]- sat 95%

## 2020-11-07 NOTE — ED Notes (Signed)
Pt ambulated to restroom without assistance.

## 2020-11-08 ENCOUNTER — Inpatient Hospital Stay (HOSPITAL_COMMUNITY): Payer: 59

## 2020-11-08 DIAGNOSIS — E871 Hypo-osmolality and hyponatremia: Secondary | ICD-10-CM

## 2020-11-08 DIAGNOSIS — J9601 Acute respiratory failure with hypoxia: Secondary | ICD-10-CM

## 2020-11-08 DIAGNOSIS — E876 Hypokalemia: Secondary | ICD-10-CM

## 2020-11-08 DIAGNOSIS — E782 Mixed hyperlipidemia: Secondary | ICD-10-CM

## 2020-11-08 DIAGNOSIS — Z720 Tobacco use: Secondary | ICD-10-CM

## 2020-11-08 DIAGNOSIS — F32A Depression, unspecified: Secondary | ICD-10-CM

## 2020-11-08 DIAGNOSIS — I10 Essential (primary) hypertension: Secondary | ICD-10-CM

## 2020-11-08 DIAGNOSIS — A419 Sepsis, unspecified organism: Secondary | ICD-10-CM

## 2020-11-08 DIAGNOSIS — E781 Pure hyperglyceridemia: Secondary | ICD-10-CM

## 2020-11-08 LAB — BASIC METABOLIC PANEL
Anion gap: 13 (ref 5–15)
BUN: 20 mg/dL (ref 6–20)
CO2: 24 mmol/L (ref 22–32)
Calcium: 8.4 mg/dL — ABNORMAL LOW (ref 8.9–10.3)
Chloride: 96 mmol/L — ABNORMAL LOW (ref 98–111)
Creatinine, Ser: 1.07 mg/dL (ref 0.61–1.24)
GFR, Estimated: >60 mL/min (ref >60)
Glucose, Bld: 93 mg/dL (ref 70–99)
Potassium: 3.7 mmol/L (ref 3.5–5.1)
Sodium: 133 mmol/L — ABNORMAL LOW (ref 135–145)

## 2020-11-08 LAB — URINALYSIS, ROUTINE W REFLEX MICROSCOPIC
Bacteria, UA: NONE SEEN
Bilirubin Urine: NEGATIVE
Glucose, UA: NEGATIVE mg/dL
Ketones, ur: NEGATIVE mg/dL
Leukocytes,Ua: NEGATIVE
Nitrite: NEGATIVE
Protein, ur: NEGATIVE mg/dL
Specific Gravity, Urine: 1.014 (ref 1.005–1.030)
pH: 6 (ref 5.0–8.0)

## 2020-11-08 LAB — CBC
HCT: 47.1 % (ref 39.0–52.0)
Hemoglobin: 15.8 g/dL (ref 13.0–17.0)
MCH: 35.2 pg — ABNORMAL HIGH (ref 26.0–34.0)
MCHC: 33.5 g/dL (ref 30.0–36.0)
MCV: 104.9 fL — ABNORMAL HIGH (ref 80.0–100.0)
Platelets: 151 10*3/uL (ref 150–400)
RBC: 4.49 MIL/uL (ref 4.22–5.81)
RDW: 13.2 % (ref 11.5–15.5)
WBC: 6.4 10*3/uL (ref 4.0–10.5)
nRBC: 0 % (ref 0.0–0.2)

## 2020-11-08 LAB — MAGNESIUM: Magnesium: 1.7 mg/dL (ref 1.7–2.4)

## 2020-11-08 LAB — EXPECTORATED SPUTUM ASSESSMENT W GRAM STAIN, RFLX TO RESP C

## 2020-11-08 LAB — PHOSPHORUS: Phosphorus: 5.3 mg/dL — ABNORMAL HIGH (ref 2.5–4.6)

## 2020-11-08 LAB — STREP PNEUMONIAE URINARY ANTIGEN: Strep Pneumo Urinary Antigen: POSITIVE — AB

## 2020-11-08 LAB — HIV ANTIBODY (ROUTINE TESTING W REFLEX): HIV Screen 4th Generation wRfx: NONREACTIVE

## 2020-11-08 MED ORDER — OMEGA-3-ACID ETHYL ESTERS 1 G PO CAPS
1.0000 | ORAL_CAPSULE | Freq: Every day | ORAL | Status: DC
  Administered 2020-11-08 – 2020-11-11 (×4): 1 g via ORAL
  Filled 2020-11-08 (×4): qty 1

## 2020-11-08 MED ORDER — GEMFIBROZIL 600 MG PO TABS
600.0000 mg | ORAL_TABLET | Freq: Two times a day (BID) | ORAL | Status: DC
  Administered 2020-11-08 – 2020-11-11 (×7): 600 mg via ORAL
  Filled 2020-11-08 (×7): qty 1

## 2020-11-08 MED ORDER — IPRATROPIUM-ALBUTEROL 0.5-2.5 (3) MG/3ML IN SOLN
3.0000 mL | Freq: Three times a day (TID) | RESPIRATORY_TRACT | Status: DC
  Administered 2020-11-08 – 2020-11-10 (×8): 3 mL via RESPIRATORY_TRACT
  Filled 2020-11-08 (×7): qty 3

## 2020-11-08 MED ORDER — METHYLPREDNISOLONE SODIUM SUCC 125 MG IJ SOLR
60.0000 mg | Freq: Once | INTRAMUSCULAR | Status: AC
  Administered 2020-11-08: 60 mg via INTRAVENOUS
  Filled 2020-11-08: qty 2

## 2020-11-08 MED ORDER — MAGNESIUM SULFATE 4 GM/100ML IV SOLN
4.0000 g | Freq: Once | INTRAVENOUS | Status: AC
  Administered 2020-11-08: 4 g via INTRAVENOUS
  Filled 2020-11-08: qty 100

## 2020-11-08 MED ORDER — BUDESONIDE 0.5 MG/2ML IN SUSP
0.5000 mg | Freq: Two times a day (BID) | RESPIRATORY_TRACT | Status: DC
  Administered 2020-11-08 – 2020-11-11 (×6): 0.5 mg via RESPIRATORY_TRACT
  Filled 2020-11-08 (×6): qty 2

## 2020-11-08 MED ORDER — FLUTICASONE PROPIONATE 50 MCG/ACT NA SUSP
2.0000 | Freq: Every day | NASAL | Status: DC
  Administered 2020-11-08 – 2020-11-11 (×4): 2 via NASAL
  Filled 2020-11-08: qty 16

## 2020-11-08 MED ORDER — KETOROLAC TROMETHAMINE 30 MG/ML IJ SOLN
30.0000 mg | Freq: Four times a day (QID) | INTRAMUSCULAR | Status: DC | PRN
  Administered 2020-11-08 – 2020-11-09 (×2): 30 mg via INTRAVENOUS
  Filled 2020-11-08 (×2): qty 1

## 2020-11-08 MED ORDER — ADULT MULTIVITAMIN W/MINERALS CH
1.0000 | ORAL_TABLET | Freq: Every day | ORAL | Status: DC
  Administered 2020-11-08 – 2020-11-11 (×4): 1 via ORAL
  Filled 2020-11-08 (×5): qty 1

## 2020-11-08 MED ORDER — MAGNESIUM SULFATE 2 GM/50ML IV SOLN
2.0000 g | Freq: Once | INTRAVENOUS | Status: AC
  Administered 2020-11-08: 2 g via INTRAVENOUS
  Filled 2020-11-08: qty 50

## 2020-11-08 MED ORDER — LORAZEPAM 2 MG/ML IJ SOLN: 1.0000 mg | INTRAMUSCULAR | Status: AC | PRN

## 2020-11-08 MED ORDER — ATORVASTATIN CALCIUM 40 MG PO TABS
40.0000 mg | ORAL_TABLET | Freq: Every day | ORAL | Status: DC
  Administered 2020-11-08 – 2020-11-11 (×4): 40 mg via ORAL
  Filled 2020-11-08 (×4): qty 1

## 2020-11-08 MED ORDER — LORAZEPAM 1 MG PO TABS: 1.0000 mg | ORAL_TABLET | ORAL | Status: AC | PRN

## 2020-11-08 MED ORDER — PANTOPRAZOLE SODIUM 40 MG PO TBEC
40.0000 mg | DELAYED_RELEASE_TABLET | Freq: Every day | ORAL | Status: DC
  Administered 2020-11-08 – 2020-11-11 (×4): 40 mg via ORAL
  Filled 2020-11-08 (×4): qty 1

## 2020-11-08 MED ORDER — SODIUM CHLORIDE 0.9 % IV SOLN: INTRAVENOUS | Status: DC

## 2020-11-08 MED ORDER — LORATADINE 10 MG PO TABS
10.0000 mg | ORAL_TABLET | Freq: Every day | ORAL | Status: DC
  Administered 2020-11-08 – 2020-11-11 (×4): 10 mg via ORAL
  Filled 2020-11-08 (×4): qty 1

## 2020-11-08 MED ORDER — FOLIC ACID 1 MG PO TABS
1.0000 mg | ORAL_TABLET | Freq: Every day | ORAL | Status: DC
  Administered 2020-11-08 – 2020-11-11 (×4): 1 mg via ORAL
  Filled 2020-11-08 (×4): qty 1

## 2020-11-08 MED ORDER — THIAMINE HCL 100 MG/ML IJ SOLN
100.0000 mg | Freq: Every day | INTRAMUSCULAR | Status: DC
  Administered 2020-11-10 (×2): 100 mg via INTRAVENOUS
  Filled 2020-11-08 (×2): qty 2

## 2020-11-08 MED ORDER — MOMETASONE FURO-FORMOTEROL FUM 100-5 MCG/ACT IN AERO
2.0000 | INHALATION_SPRAY | Freq: Two times a day (BID) | RESPIRATORY_TRACT | Status: DC
  Filled 2020-11-08: qty 8.8

## 2020-11-08 MED ORDER — THIAMINE HCL 100 MG PO TABS
100.0000 mg | ORAL_TABLET | Freq: Every day | ORAL | Status: DC
  Administered 2020-11-08 – 2020-11-11 (×3): 100 mg via ORAL
  Filled 2020-11-08 (×4): qty 1

## 2020-11-08 NOTE — Plan of Care (Signed)
  Problem: Education: Goal: Ability to demonstrate management of disease process will improve Outcome: Progressing Goal: Ability to verbalize understanding of medication therapies will improve Outcome: Progressing Goal: Individualized Educational Video(s) Outcome: Progressing   Problem: Activity: Goal: Capacity to carry out activities will improve Outcome: Progressing   Problem: Cardiac: Goal: Ability to achieve and maintain adequate cardiopulmonary perfusion will improve Outcome: Progressing   Problem: Education: Goal: Knowledge of General Education information will improve Description: Including pain rating scale, medication(s)/side effects and non-pharmacologic comfort measures Outcome: Progressing   Problem: Health Behavior/Discharge Planning: Goal: Ability to manage health-related needs will improve Outcome: Progressing   Problem: Clinical Measurements: Goal: Ability to maintain clinical measurements within normal limits will improve Outcome: Progressing Goal: Will remain free from infection Outcome: Progressing Goal: Diagnostic test results will improve Outcome: Progressing Goal: Respiratory complications will improve Outcome: Progressing Goal: Cardiovascular complication will be avoided Outcome: Progressing   Problem: Activity: Goal: Risk for activity intolerance will decrease Outcome: Progressing   Problem: Nutrition: Goal: Adequate nutrition will be maintained Outcome: Progressing   Problem: Coping: Goal: Level of anxiety will decrease Outcome: Progressing   Problem: Elimination: Goal: Will not experience complications related to bowel motility Outcome: Progressing Goal: Will not experience complications related to urinary retention Outcome: Progressing   Problem: Pain Managment: Goal: General experience of comfort will improve Outcome: Progressing   Problem: Safety: Goal: Ability to remain free from injury will improve Outcome: Progressing    Problem: Skin Integrity: Goal: Risk for impaired skin integrity will decrease Outcome: Progressing   Problem: Education: Goal: Knowledge of disease or condition will improve Outcome: Progressing Goal: Knowledge of the prescribed therapeutic regimen will improve Outcome: Progressing Goal: Individualized Educational Video(s) Outcome: Progressing   Problem: Activity: Goal: Ability to tolerate increased activity will improve Outcome: Progressing Goal: Will verbalize the importance of balancing activity with adequate rest periods Outcome: Progressing   Problem: Respiratory: Goal: Ability to maintain a clear airway will improve Outcome: Progressing Goal: Levels of oxygenation will improve Outcome: Progressing Goal: Ability to maintain adequate ventilation will improve Outcome: Progressing

## 2020-11-08 NOTE — Progress Notes (Signed)
PROGRESS NOTE    Charles Santos  OHY:[id number] DOB: 1971/01/31 DOA: 11/07/2020 PCP: Claretta Fraise, MD    Chief Complaint  Patient presents with  . Shortness of Breath  . Chest Pain  . Generalized Body Aches  . Hemoptysis    Brief Narrative:  HPI per Dr. Cloyde Reams is a 49 y.o. male with medical history significant of colon cancer status post chemo and subtotal colectomy, emphysema, hypertension, hyperlipidemia, GERD, anxiety, depression, tobacco use presenting to the ED with complaints of shortness of breath, pleuritic chest pain, and hemoptysis.  Patient stated he had been feeling bad for the past 1 day.  He was experiencing right-sided pleuritic chest pain whenever he takes deep breaths.  He is coughing a lot and cough is productive of blood-tinged sputum.  Denies fevers or chills.  States he vomited once earlier today after eating something and then vomited again after he received morphine in the ED.  Reports having chronic diarrhea/loose stools since he had colectomy done.  Denies abdominal pain.  No other complaints.  ED Course: Afebrile.  Tachycardic and tachypneic.  Not hypotensive.  SPO2 89% on room air, improved with 2 L supplemental oxygen.  WBC 15.7, hemoglobin 17.4, hematocrit 50.4, platelet 170K.  Sodium 132, potassium 3.3, chloride 92, bicarb 25, BUN 16, creatinine 0.9, glucose 107.  High-sensitivity troponin negative x2.  SARS-CoV-2 PCR test negative.  Influenza panel negative.  Blood culture pending.    CT angiogram chest negative for PE and showing right lower lobe consolidation most consistent with pneumonia.  Patient received ceftriaxone, azithromycin, 2 L LR boluses, morphine, and Zofran.   Assessment & Plan:   Principal Problem:   CAP (community acquired pneumonia) Active Problems:   Acute hypoxemic respiratory failure (HCC)   Sepsis ([place])   Hyponatremia   Hypokalemia  1 sepsis secondary to CAP/acute respiratory failure with hypoxia Patient  presenting with pleuritic chest pain, shortness of breath, hemoptysis noted to be hypoxic on presentation on room air and currently requiring 3 L supplemental nasal cannula.  Patient met criteria for sepsis with tachycardia, tachypnea, leukocytosis, borderline blood pressure, right lower lobe infiltrate noted on CT angiogram of chest consistent with pneumonia.  SARS Covid 2 PCR test negative.  Influenza A and B negative.  Patient currently afebrile.  Leukocytosis trending down.  Patient also noted with some wheezing on examination with history of underlying tobacco use.  Check urine Legionella antigen.  Check urine strep pneumococcus antigen.  Check a sputum Gram stain and culture.  Continue IV Rocephin and azithromycin.  Placed on scheduled duo nebs, IV Solu-Medrol 60 mg x 1, Pulmicort, Flonase, Claritin, PPI.  Supportive care.  Placed on normal saline 125 cc an hour.  Follow.  Follow.  2.  Mild hyponatremia Likely secondary to poor oral intake in the setting of acute illness.  Improved with hydration.  Placed on normal saline 125 cc an hour.  Follow.  3.  Mild hypokalemia/hypomagnesemia Repeat magnesium level at 1.7.  Magnesium sulfate 4 g IV x1.  Potassium at 3.7.  Repeat labs in the morning.  4.  Hypertension Blood pressure borderline.  Patient noted to have low blood pressure overnight.  Continue to hold antihypertensive medications.  Normal saline 125 cc an hour.  Follow.  5.  Gastroesophageal reflux disease PPI.  6.  Depression/anxiety Per MAR not on any medications prior to admission.  Outpatient follow-up.  7.  Hyperlipidemia Resume home regimen Lopid, fish oil.  8.  Tobacco abuse Tobacco cessation.  Continue nicotine  patch.  9.  Alcohol use Patient with no noted significant alcohol dependence.  Place on the Ativan as needed withdrawal protocol.  Follow.   DVT prophylaxis: Lovenox Code Status: Full Family Communication: Updated patient.  No family at bedside. Disposition:    Status is: Inpatient    Dispo: The patient is from: Home              Anticipated d/c is to: Home              Anticipated d/c date is: 3 to 4 days.              Patient currently on IV antibiotics, right lower lobe pneumonia, not stable for discharge.       Consultants:   None  Procedures:   CT angiogram chest 11/07/2020  Right upper quadrant ultrasound 11/08/2020  Antimicrobials:   IV Rocephin 11/07/2020>>>  IV azithromycin 11/07/2020>>>>>   Subjective: Patient sitting up in bed.  Patient with cough.  Patient still with pleuritic right-sided chest pain.  Still with shortness of breath however minimally improved since admission.  Feeling slightly better.  Tobacco cessation stressed to patient.  Objective: Vitals:   11/08/20 0145 11/08/20 0240 11/08/20 0645 11/08/20 1012  BP: 109/68 103/67 96/61 103/61  Pulse: (!) 120 (!) 114 (!) 103 100  Resp: (!) 36 (!) 34 (!) 26 (!) 22  Temp: 99.7 F (37.6 C) 98.7 F (37.1 C) 98.7 F (37.1 C) 98.3 F (36.8 C)  TempSrc: Oral Oral Oral Oral  SpO2: 97% 95% 97% 94%  Weight:      Height:        Intake/Output Summary (Last 24 hours) at 11/08/2020 1256 Last data filed at 11/08/2020 3300 Gross per 24 hour  Intake 890 ml  Output --  Net 890 ml   Filed Weights   11/07/20 1558  Weight: 98 kg    Examination:  General exam: NAD Respiratory system: Some coarse breath sounds on the right > L.  Fair air movement.  No crackles.  Minimal expiratory wheezing.  Cardiovascular system: S1 & S2 heard, RRR. No JVD, murmurs, rubs, gallops or clicks. No pedal edema. Gastrointestinal system: Abdomen is nondistended, soft and nontender. No organomegaly or masses felt. Normal bowel sounds heard. Central nervous system: Alert and oriented. No focal neurological deficits. Extremities: Symmetric 5 x 5 power. Skin: No rashes, lesions or ulcers Psychiatry: Judgement and insight appear normal. Mood & affect appropriate.     Data  Reviewed: I have personally reviewed following labs and imaging studies  CBC: Recent Labs  Lab 11/07/20 1629 11/08/20 0456  WBC 15.7* 6.4  HGB 17.4* 15.8  HCT 50.4 47.1  MCV 102.2* 104.9*  PLT 170 762    Basic Metabolic Panel: Recent Labs  Lab 11/07/20 1629 11/08/20 0456 11/08/20 0807  NA 132* 133*  --   K 3.3* 3.7  --   CL 92* 96*  --   CO2 25 24  --   GLUCOSE 107* 93  --   BUN 16 20  --   CREATININE 0.95 1.07  --   CALCIUM 9.3 8.4*  --   MG 1.5*  --  1.7  PHOS  --   --  5.3*    GFR: Estimated Creatinine Clearance: 99.1 mL/min (by C-G formula based on SCr of 1.07 mg/dL).  Liver Function Tests: Recent Labs  Lab 11/07/20 1629  AST 45*  ALT 33  ALKPHOS 57  BILITOT 1.8*  PROT 7.6  ALBUMIN 3.7  CBG: No results for input(s): GLUCAP in the last 168 hours.   Recent Results (from the past 240 hour(s))  Resp Panel by RT-PCR (Flu A&B, Covid) Nasopharyngeal Swab     Status: None   Collection Time: 11/07/20  4:29 PM   Specimen: Nasopharyngeal Swab; Nasopharyngeal(NP) swabs in vial transport medium  Result Value Ref Range Status   SARS Coronavirus 2 by RT PCR NEGATIVE NEGATIVE Final    Comment: (NOTE) SARS-CoV-2 target nucleic acids are NOT DETECTED.  The SARS-CoV-2 RNA is generally detectable in upper respiratory specimens during the acute phase of infection. The lowest concentration of SARS-CoV-2 viral copies this assay can detect is 138 copies/mL. A negative result does not preclude SARS-Cov-2 infection and should not be used as the sole basis for treatment or other patient management decisions. A negative result may occur with  improper specimen collection/handling, submission of specimen other than nasopharyngeal swab, presence of viral mutation(s) within the areas targeted by this assay, and inadequate number of viral copies(<138 copies/mL). A negative result must be combined with clinical observations, patient history, and  epidemiological information. The expected result is Negative.  Fact Sheet for Patients:  EntrepreneurPulse.com.au  Fact Sheet for Healthcare Providers:  IncredibleEmployment.be  This test is no t yet approved or cleared by the [place] FDA and  has been authorized for detection and/or diagnosis of SARS-CoV-2 by FDA under an Emergency Use Authorization (EUA). This EUA will remain  in effect (meaning this test can be used) for the duration of the COVID-19 declaration under Section 564(b)(1) of the Act, 21 U.S.C.section 360bbb-3(b)(1), unless the authorization is terminated  or revoked sooner.       Influenza A by PCR NEGATIVE NEGATIVE Final   Influenza B by PCR NEGATIVE NEGATIVE Final    Comment: (NOTE) The Xpert Xpress SARS-CoV-2/FLU/RSV plus assay is intended as an aid in the diagnosis of influenza from Nasopharyngeal swab specimens and should not be used as a sole basis for treatment. Nasal washings and aspirates are unacceptable for Xpert Xpress SARS-CoV-2/FLU/RSV testing.  Fact Sheet for Patients: EntrepreneurPulse.com.au  Fact Sheet for Healthcare Providers: IncredibleEmployment.be  This test is not yet approved or cleared by the [place] FDA and has been authorized for detection and/or diagnosis of SARS-CoV-2 by FDA under an Emergency Use Authorization (EUA). This EUA will remain in effect (meaning this test can be used) for the duration of the COVID-19 declaration under Section 564(b)(1) of the Act, 21 U.S.C. section 360bbb-3(b)(1), unless the authorization is terminated or revoked.  Performed at [facility], [place] [address], [place], [place] [address]   Expectorated sputum assessment w rflx to resp cult     Status: None   Collection Time: 11/08/20 11:26 AM   Specimen: Sputum  Result Value Ref Range Status   Specimen Description SPUTUM  Final   Special Requests  EXPECTORATED  Final   Sputum evaluation   Final    THIS SPECIMEN IS ACCEPTABLE FOR SPUTUM CULTURE Performed at [facility], [place] [address], [place], [place] [address]    Report Status 11/08/2020 FINAL  Final         Radiology Studies: CT Angio Chest PE W and/or Wo Contrast  Result Date: 11/07/2020 CLINICAL DATA:  49 year old male with concern for pulmonary embolism. EXAM: CT ANGIOGRAPHY CHEST WITH CONTRAST TECHNIQUE: Multidetector CT imaging of the chest was performed using the standard protocol during bolus administration of intravenous contrast. Multiplanar CT image reconstructions and MIPs were obtained to evaluate the  vascular anatomy. CONTRAST:  23m OMNIPAQUE IOHEXOL 350 MG/ML SOLN COMPARISON:  None. FINDINGS: Evaluation of this exam is limited due to respiratory motion artifact. Cardiovascular: There is no cardiomegaly or pericardial effusion. The thoracic aorta is unremarkable. Evaluation of the pulmonary arteries is very limited due to respiratory motion artifact and suboptimal opacification. No definite large central pulmonary artery embolus identified. Mediastinum/Nodes: Top-normal right infrahilar or subcarinal lymph nodes measuring 10 mm (142/5). The esophagus and the thyroid gland are grossly unremarkable. No mediastinal fluid collection. Lungs/Pleura: Patchy area of consolidation in the right lower lobe most consistent with pneumonia. Clinical correlation and follow-up to resolution recommended. The left lung is clear. There is no pleural effusion or pneumothorax. Upper Abdomen: No acute abnormality. Musculoskeletal: No chest wall abnormality. No acute or significant osseous findings. Review of the MIP images confirms the above findings. IMPRESSION: 1. No CT evidence of central pulmonary artery embolus. 2. Right lower lobe pneumonia. Clinical correlation and follow-up to resolution recommended. Electronically Signed   By: AAnner CreteM.D.   On: 11/07/2020 19:23    UKoreaAbdomen Limited RUQ (LIVER/GB)  Result Date: 11/08/2020 CLINICAL DATA:  Elevated liver enzymes EXAM: ULTRASOUND ABDOMEN LIMITED RIGHT UPPER QUADRANT COMPARISON:  CT abdomen and pelvis June 29, 2020 FINDINGS: Gallbladder: No gallstones or wall thickening visualized. There is no pericholecystic fluid. No sonographic Murphy sign noted by sonographer. Common bile duct: Diameter: 5 mm. No intrahepatic or extrahepatic biliary duct dilatation. Liver: No focal lesion identified. Within normal limits in parenchymal echogenicity. Portal vein is patent on color Doppler imaging with normal direction of blood flow towards the liver. Other: None. IMPRESSION: Study within normal limits. Electronically Signed   By: WLowella GripIII M.D.   On: 11/08/2020 09:56        Scheduled Meds: . atorvastatin  40 mg Oral Daily  . folic acid  1 mg Oral Daily  . gemfibrozil  600 mg Oral BID AC  . ipratropium-albuterol  3 mL Nebulization TID  . mometasone-formoterol  2 puff Inhalation BID  . multivitamin with minerals  1 tablet Oral Daily  . nicotine  21 mg Transdermal Daily  . omega-3 acid ethyl esters  1 capsule Oral Daily  . thiamine  100 mg Oral Daily   Or  . thiamine  100 mg Intravenous Daily   Continuous Infusions: . azithromycin    . cefTRIAXone (ROCEPHIN)  IV    . magnesium sulfate bolus IVPB       LOS: 1 day    Time spent: 40 minutes    DIrine Seal MD [facility]   To contact the attending provider between 7A-7P or the covering provider during after hours 7P-7A, please log into the web site [email or web address] and access using universal [place] password for that web site. If you do not have the password, please call the hospital operator.  11/08/2020, 12:56 PM

## 2020-11-09 DIAGNOSIS — A403 Sepsis due to Streptococcus pneumoniae: Secondary | ICD-10-CM

## 2020-11-09 LAB — CBC WITH DIFFERENTIAL/PLATELET
Abs Immature Granulocytes: 0.05 10*3/uL (ref 0.00–0.07)
Basophils Absolute: 0 10*3/uL (ref 0.0–0.1)
Basophils Relative: 0 %
Eosinophils Absolute: 0 10*3/uL (ref 0.0–0.5)
Eosinophils Relative: 0 %
HCT: 40.1 % (ref 39.0–52.0)
Hemoglobin: 13.5 g/dL (ref 13.0–17.0)
Immature Granulocytes: 1 %
Lymphocytes Relative: 2 %
Lymphs Abs: 0.2 10*3/uL — ABNORMAL LOW (ref 0.7–4.0)
MCH: 34.7 pg — ABNORMAL HIGH (ref 26.0–34.0)
MCHC: 33.7 g/dL (ref 30.0–36.0)
MCV: 103.1 fL — ABNORMAL HIGH (ref 80.0–100.0)
Monocytes Absolute: 0.4 10*3/uL (ref 0.1–1.0)
Monocytes Relative: 4 %
Neutro Abs: 10.2 10*3/uL — ABNORMAL HIGH (ref 1.7–7.7)
Neutrophils Relative %: 93 %
Platelets: 150 10*3/uL (ref 150–400)
RBC: 3.89 MIL/uL — ABNORMAL LOW (ref 4.22–5.81)
RDW: 13 % (ref 11.5–15.5)
WBC Morphology: INCREASED
WBC: 11 10*3/uL — ABNORMAL HIGH (ref 4.0–10.5)
nRBC: 0 % (ref 0.0–0.2)

## 2020-11-09 LAB — COMPREHENSIVE METABOLIC PANEL
ALT: 20 U/L (ref 0–44)
AST: 29 U/L (ref 15–41)
Albumin: 2.7 g/dL — ABNORMAL LOW (ref 3.5–5.0)
Alkaline Phosphatase: 40 U/L (ref 38–126)
Anion gap: 11 (ref 5–15)
BUN: 19 mg/dL (ref 6–20)
CO2: 26 mmol/L (ref 22–32)
Calcium: 8.6 mg/dL — ABNORMAL LOW (ref 8.9–10.3)
Chloride: 99 mmol/L (ref 98–111)
Creatinine, Ser: 0.94 mg/dL (ref 0.61–1.24)
GFR, Estimated: >60 mL/min (ref >60)
Glucose, Bld: 143 mg/dL — ABNORMAL HIGH (ref 70–99)
Potassium: 3.6 mmol/L (ref 3.5–5.1)
Sodium: 136 mmol/L (ref 135–145)
Total Bilirubin: 0.7 mg/dL (ref 0.3–1.2)
Total Protein: 6.3 g/dL — ABNORMAL LOW (ref 6.5–8.1)

## 2020-11-09 LAB — URINE CULTURE: Culture: NO GROWTH

## 2020-11-09 LAB — MAGNESIUM: Magnesium: 2.7 mg/dL — ABNORMAL HIGH (ref 1.7–2.4)

## 2020-11-09 MED ORDER — GUAIFENESIN ER 600 MG PO TB12
1200.0000 mg | ORAL_TABLET | Freq: Two times a day (BID) | ORAL | Status: DC
  Administered 2020-11-09 – 2020-11-11 (×5): 1200 mg via ORAL
  Filled 2020-11-09 (×6): qty 2

## 2020-11-09 MED ORDER — POTASSIUM CHLORIDE CRYS ER 20 MEQ PO TBCR
40.0000 meq | EXTENDED_RELEASE_TABLET | Freq: Once | ORAL | Status: AC
  Administered 2020-11-09: 40 meq via ORAL
  Filled 2020-11-09: qty 2

## 2020-11-09 MED ORDER — AZITHROMYCIN 250 MG PO TABS
500.0000 mg | ORAL_TABLET | Freq: Every day | ORAL | Status: DC
  Administered 2020-11-09 – 2020-11-10 (×2): 500 mg via ORAL
  Filled 2020-11-09 (×2): qty 2

## 2020-11-09 NOTE — Progress Notes (Signed)
SATURATION QUALIFICATIONS: (This note is used to comply with regulatory documentation for home oxygen)  Patient Saturations on Room Air at Rest = 93%  Patient Saturations on Room Air while Ambulating = 92%  Patient Saturations on 0 Liters of oxygen while Ambulating = 92%  Please briefly explain why patient needs home oxygen: no home O2 needed

## 2020-11-09 NOTE — Progress Notes (Signed)
PROGRESS NOTE    Charles Santos  DDU:[id number] DOB: 10-30-1971 DOA: 11/07/2020 PCP: Claretta Fraise, MD    Chief Complaint  Patient presents with  . Shortness of Breath  . Chest Pain  . Generalized Body Aches  . Hemoptysis    Brief Narrative:  HPI per Dr. Cloyde Reams is a 49 y.o. male with medical history significant of colon cancer status post chemo and subtotal colectomy, emphysema, hypertension, hyperlipidemia, GERD, anxiety, depression, tobacco use presenting to the ED with complaints of shortness of breath, pleuritic chest pain, and hemoptysis.  Patient stated he had been feeling bad for the past 1 day.  He was experiencing right-sided pleuritic chest pain whenever he takes deep breaths.  He is coughing a lot and cough is productive of blood-tinged sputum.  Denies fevers or chills.  States he vomited once earlier today after eating something and then vomited again after he received morphine in the ED.  Reports having chronic diarrhea/loose stools since he had colectomy done.  Denies abdominal pain.  No other complaints.  ED Course: Afebrile.  Tachycardic and tachypneic.  Not hypotensive.  SPO2 89% on room air, improved with 2 L supplemental oxygen.  WBC 15.7, hemoglobin 17.4, hematocrit 50.4, platelet 170K.  Sodium 132, potassium 3.3, chloride 92, bicarb 25, BUN 16, creatinine 0.9, glucose 107.  High-sensitivity troponin negative x2.  SARS-CoV-2 PCR test negative.  Influenza panel negative.  Blood culture pending.    CT angiogram chest negative for PE and showing right lower lobe consolidation most consistent with pneumonia.  Patient received ceftriaxone, azithromycin, 2 L LR boluses, morphine, and Zofran.   Assessment & Plan:   Principal Problem:   Sepsis ([place]) Active Problems:   Hyperlipidemia   Depression   Tobacco abuse   Hypertriglyceridemia   Essential hypertension   CAP (community acquired pneumonia)   Acute hypoxemic respiratory failure (HCC)    Hyponatremia   Hypokalemia  1 sepsis secondary to CAP/acute respiratory failure with hypoxia Patient presenting with pleuritic chest pain, shortness of breath, hemoptysis noted to be hypoxic on presentation on room air and currently requiring 3 L supplemental nasal cannula.  Patient met criteria for sepsis with tachycardia, tachypnea, leukocytosis, borderline blood pressure, right lower lobe infiltrate noted on CT angiogram of chest consistent with pneumonia.  SARS Covid 2 PCR test negative.  Influenza A and B negative.  Patient currently afebrile.  Leukocytosis trending down.  Patient also noted with some wheezing on examination with history of underlying tobacco use.  Urine Legionella antigen pending.  Urine strep pneumococcus antigen positive.  Sputum Gram stain and cultures pending.  Continue IV Rocephin and azithromycin, scheduled duo nebs, Pulmicort, Flonase, Claritin, PPI.  Status post 1 dose IV Solu-Medrol.  IV fluids.  Supportive care.  2.  Mild hyponatremia Likely secondary to poor oral intake in the setting of acute illness.  Improved with hydration.    3.  Mild hypokalemia/hypomagnesemia Repleted.  Potassium at 3.6.  Magnesium at 2.7.  K. Dur 40 mEq p.o. x1.  Follow.   4.  Hypertension Blood pressure borderline on admission.  Blood pressure improving.  Continue hydration for the next 24 to 48 hours.  Continue to hold antihypertensive medications.  Follow.  5.  Gastroesophageal reflux disease Continue PPI.  6.  Depression/anxiety Per MAR not on any medications prior to admission.  Outpatient follow-up.  7.  Hyperlipidemia Continue Lovaza, Lopid.  8.  Tobacco abuse Tobacco cessation stressed to patient.  Continue nicotine patch.  9.  Alcohol use Patient  with no noted significant alcohol dependence.  Continue the Ativan withdrawal protocol.  Follow.     DVT prophylaxis: Lovenox Code Status: Full Family Communication: Updated patient.  No family at bedside. Disposition:    Status is: Inpatient    Dispo: The patient is from: Home              Anticipated d/c is to: Home              Anticipated d/c date is: 2-3 days.              Patient currently on IV antibiotics, right lower lobe pneumonia, not stable for discharge.       Consultants:   None  Procedures:   CT angiogram chest 11/07/2020  Right upper quadrant ultrasound 11/08/2020  Antimicrobials:   IV Rocephin 11/07/2020>>>  IV azithromycin 11/07/2020>>>>>   Subjective: Patient ambulating in room.  States improvement with pleuritic right-sided chest pain and shortness of breath.  Denies any chest pain.   Objective: Vitals:   11/08/20 2109 11/09/20 0535 11/09/20 0819 11/09/20 0957  BP:  110/66    Pulse:  75    Resp:  (!) 21    Temp:  98 F (36.7 C)    TempSrc:  Oral    SpO2: 94% 99% 99% 95%  Weight:      Height:        Intake/Output Summary (Last 24 hours) at 11/09/2020 1251 Last data filed at 11/09/2020 0600 Gross per 24 hour  Intake 555 ml  Output 300 ml  Net 255 ml   Filed Weights   11/07/20 1558  Weight: 98 kg    Examination:  General exam: NAD Respiratory system: Some coarse breath sounds R > L.  No crackles.  No wheezing noted.  Fair air movement.  No use of accessory muscles of respiration.  Cardiovascular system: Regular rate rhythm no murmurs rubs or gallops.  No JVD.  No lower extremity edema.  Gastrointestinal system: Abdomen is soft, nontender, nondistended, positive bowel sounds.  No rebound.  No guarding.  Central nervous system: Alert and oriented. No focal neurological deficits. Extremities: Symmetric 5 x 5 power. Skin: No rashes, lesions or ulcers Psychiatry: Judgement and insight appear normal. Mood & affect appropriate.     Data Reviewed: I have personally reviewed following labs and imaging studies  CBC: Recent Labs  Lab 11/07/20 1629 11/08/20 0456 11/09/20 0609  WBC 15.7* 6.4 11.0*  NEUTROABS  --   --  10.2*  HGB 17.4* 15.8 13.5   HCT 50.4 47.1 40.1  MCV 102.2* 104.9* 103.1*  PLT 170 151 269    Basic Metabolic Panel: Recent Labs  Lab 11/07/20 1629 11/08/20 0456 11/08/20 0807 11/09/20 0609  NA 132* 133*  --  136  K 3.3* 3.7  --  3.6  CL 92* 96*  --  99  CO2 25 24  --  26  GLUCOSE 107* 93  --  143*  BUN 16 20  --  19  CREATININE 0.95 1.07  --  0.94  CALCIUM 9.3 8.4*  --  8.6*  MG 1.5*  --  1.7 2.7*  PHOS  --   --  5.3*  --     GFR: Estimated Creatinine Clearance: 112.8 mL/min (by C-G formula based on SCr of 0.94 mg/dL).  Liver Function Tests: Recent Labs  Lab 11/07/20 1629 11/09/20 0609  AST 45* 29  ALT 33 20  ALKPHOS 57 40  BILITOT 1.8* 0.7  PROT 7.6  6.3*  ALBUMIN 3.7 2.7*    CBG: No results for input(s): GLUCAP in the last 168 hours.   Recent Results (from the past 240 hour(s))  Resp Panel by RT-PCR (Flu A&B, Covid) Nasopharyngeal Swab     Status: None   Collection Time: 11/07/20  4:29 PM   Specimen: Nasopharyngeal Swab; Nasopharyngeal(NP) swabs in vial transport medium  Result Value Ref Range Status   SARS Coronavirus 2 by RT PCR NEGATIVE NEGATIVE Final    Comment: (NOTE) SARS-CoV-2 target nucleic acids are NOT DETECTED.  The SARS-CoV-2 RNA is generally detectable in upper respiratory specimens during the acute phase of infection. The lowest concentration of SARS-CoV-2 viral copies this assay can detect is 138 copies/mL. A negative result does not preclude SARS-Cov-2 infection and should not be used as the sole basis for treatment or other patient management decisions. A negative result may occur with  improper specimen collection/handling, submission of specimen other than nasopharyngeal swab, presence of viral mutation(s) within the areas targeted by this assay, and inadequate number of viral copies(<138 copies/mL). A negative result must be combined with clinical observations, patient history, and epidemiological information. The expected result is Negative.  Fact Sheet  for Patients:  EntrepreneurPulse.com.au  Fact Sheet for Healthcare Providers:  IncredibleEmployment.be  This test is no t yet approved or cleared by the [place] FDA and  has been authorized for detection and/or diagnosis of SARS-CoV-2 by FDA under an Emergency Use Authorization (EUA). This EUA will remain  in effect (meaning this test can be used) for the duration of the COVID-19 declaration under Section 564(b)(1) of the Act, 21 U.S.C.section 360bbb-3(b)(1), unless the authorization is terminated  or revoked sooner.       Influenza A by PCR NEGATIVE NEGATIVE Final   Influenza B by PCR NEGATIVE NEGATIVE Final    Comment: (NOTE) The Xpert Xpress SARS-CoV-2/FLU/RSV plus assay is intended as an aid in the diagnosis of influenza from Nasopharyngeal swab specimens and should not be used as a sole basis for treatment. Nasal washings and aspirates are unacceptable for Xpert Xpress SARS-CoV-2/FLU/RSV testing.  Fact Sheet for Patients: EntrepreneurPulse.com.au  Fact Sheet for Healthcare Providers: IncredibleEmployment.be  This test is not yet approved or cleared by the [place] FDA and has been authorized for detection and/or diagnosis of SARS-CoV-2 by FDA under an Emergency Use Authorization (EUA). This EUA will remain in effect (meaning this test can be used) for the duration of the COVID-19 declaration under Section 564(b)(1) of the Act, 21 U.S.C. section 360bbb-3(b)(1), unless the authorization is terminated or revoked.  Performed at [facility], [place] [address], [place], [place] [address]   Culture, blood (routine x 2)     Status: None (Preliminary result)   Collection Time: 11/07/20  9:50 PM   Specimen: BLOOD LEFT HAND  Result Value Ref Range Status   Specimen Description   Final    BLOOD LEFT HAND Performed at [place] [facility] [address]  [place], [place] [address]    Special Requests   Final    BOTTLES DRAWN AEROBIC AND ANAEROBIC Blood Culture adequate volume Performed at [place] [address] [place], [place] [address]    Culture   Final    NO GROWTH 1 DAY Performed at [facility], [place] [address] [place], [place] [address]    Report Status PENDING  Incomplete  Culture, blood (routine x 2)     Status: None (Preliminary result)   Collection  Time: 11/07/20  9:50 PM   Specimen: BLOOD RIGHT HAND  Result Value Ref Range Status   Specimen Description   Final    BLOOD RIGHT HAND Performed at [place] [address], [place], [place] [address]    Special Requests   Final    BOTTLES DRAWN AEROBIC AND ANAEROBIC Blood Culture adequate volume Performed at [place] [address], [place], [place] [address]    Culture   Final    NO GROWTH 1 DAY Performed at [facility], [place] [address] [place], [place] [address]    Report Status PENDING  Incomplete  Expectorated sputum assessment w rflx to resp cult     Status: None   Collection Time: 11/08/20 11:26 AM   Specimen: Sputum  Result Value Ref Range Status   Specimen Description SPUTUM  Final   Special Requests EXPECTORATED  Final   Sputum evaluation   Final    THIS SPECIMEN IS ACCEPTABLE FOR SPUTUM CULTURE Performed at [facility], [place] [address] [place], [place] [address]    Report Status 11/08/2020 FINAL  Final  Culture, respiratory     Status: None (Preliminary result)   Collection Time: 11/08/20 11:26 AM   Specimen: SPU  Result Value Ref Range Status   Specimen Description   Final    SPUTUM Performed at [place] [address], [place], [place] [address]    Special Requests   Final    EXPECTORATED Reflexed from [id number] Performed at [facility], [place] [address], [place], [place] [address]    Gram Stain   Final    RARE  WBC PRESENT, PREDOMINANTLY PMN MODERATE GRAM NEGATIVE RODS FEW GRAM POSITIVE COCCI RARE SQUAMOUS EPITHELIAL CELLS PRESENT    Culture   Final    CULTURE REINCUBATED FOR BETTER GROWTH Performed at [facility], [place] [address] [place],  [address]    Report Status PENDING  Incomplete         Radiology Studies: CT Angio Chest PE W and/or Wo Contrast  Result Date: 11/07/2020 CLINICAL DATA:  49 year old male with concern for pulmonary embolism. EXAM: CT ANGIOGRAPHY CHEST WITH CONTRAST TECHNIQUE: Multidetector CT imaging of the chest was performed using the standard protocol during bolus administration of intravenous contrast. Multiplanar CT image reconstructions and MIPs were obtained to evaluate the vascular anatomy. CONTRAST:  54m OMNIPAQUE IOHEXOL 350 MG/ML SOLN COMPARISON:  None. FINDINGS: Evaluation of this exam is limited due to respiratory motion artifact. Cardiovascular: There is no cardiomegaly or pericardial effusion. The thoracic aorta is unremarkable. Evaluation of the pulmonary arteries is very limited due to respiratory motion artifact and suboptimal opacification. No definite large central pulmonary artery embolus identified. Mediastinum/Nodes: Top-normal right infrahilar or subcarinal lymph nodes measuring 10 mm (142/5). The esophagus and the thyroid gland are grossly unremarkable. No mediastinal fluid collection. Lungs/Pleura: Patchy area of consolidation in the right lower lobe most consistent with pneumonia. Clinical correlation and follow-up to resolution recommended. The left lung is clear. There is no pleural effusion or pneumothorax. Upper Abdomen: No acute abnormality. Musculoskeletal: No chest wall abnormality. No acute or significant osseous findings. Review of the MIP images confirms the above findings. IMPRESSION: 1. No CT evidence of central pulmonary artery embolus. 2. Right lower lobe pneumonia. Clinical correlation and follow-up to resolution recommended.  Electronically Signed   By: AAnner CreteM.D.   On: 11/07/2020 19:23   UK[place]Abdomen Limited RUQ (LIVER/GB)  Result Date: 11/08/2020 CLINICAL DATA:  Elevated liver enzymes EXAM: ULTRASOUND ABDOMEN LIMITED RIGHT UPPER QUADRANT COMPARISON:  CT abdomen and pelvis June 29, 2020 FINDINGS: Gallbladder: No gallstones or wall thickening visualized. There is no pericholecystic fluid. No sonographic Murphy sign noted by sonographer. Common bile duct: Diameter: 5 mm. No intrahepatic or extrahepatic biliary duct dilatation. Liver: No focal lesion identified. Within normal limits in parenchymal echogenicity. Portal vein is patent on color Doppler imaging with normal direction of blood flow towards the liver. Other: None. IMPRESSION: Study within normal limits. Electronically Signed   By: Lowella Grip III M.D.   On: 11/08/2020 09:56        Scheduled Meds: . atorvastatin  40 mg Oral Daily  . azithromycin  500 mg Oral QHS  . budesonide (PULMICORT) nebulizer solution  0.5 mg Nebulization BID  . fluticasone  2 spray Each Nare Daily  . folic acid  1 mg Oral Daily  . gemfibrozil  600 mg Oral BID AC  . guaiFENesin  1,200 mg Oral BID  . ipratropium-albuterol  3 mL Nebulization TID  . loratadine  10 mg Oral Daily  . multivitamin with minerals  1 tablet Oral Daily  . nicotine  21 mg Transdermal Daily  . omega-3 acid ethyl esters  1 capsule Oral Daily  . pantoprazole  40 mg Oral Q0600  . thiamine  100 mg Oral Daily   Or  . thiamine  100 mg Intravenous Daily   Continuous Infusions: . sodium chloride 125 mL/hr at 11/09/20 0543  . cefTRIAXone (ROCEPHIN)  IV Stopped (11/08/20 2107)     LOS: 2 days    Time spent: 40 minutes    Irine Seal, MD [facility]   To contact the attending provider between 7A-7P or the covering provider during after hours 7P-7A, please log into the web site [email or web address] and access using universal [place] password for that web site. If you do not have the  password, please call the hospital operator.  11/09/2020, 12:51 PM

## 2020-11-09 NOTE — Progress Notes (Signed)
PHARMACIST - PHYSICIAN COMMUNICATION DR:   [place] Silos CONCERNING: Antibiotic IV to Oral Route Change Policy  RECOMMENDATION: This patient is receiving azithomycin by the intravenous route.  Based on criteria approved by the Pharmacy and Therapeutics Committee, the antibiotic(s) is/are being converted to the equivalent oral dose form(s).   DESCRIPTION: These criteria include:  Patient being treated for a respiratory tract infection, urinary tract infection, cellulitis or clostridium difficile associated diarrhea if on metronidazole  The patient is not neutropenic and does not exhibit a GI malabsorption state  The patient is eating (either orally or via tube) and/or has been taking other orally administered medications for a least 24 hours  The patient is improving clinically and has a Tmax < 100.5  If you have questions about this conversion, please contact the Pharmacy Department  []   [phone] )  [facility] []   [phone] )  [facility] []   [phone] )  [facility] []   [phone] )  [facility] [x]   [phone] )  [place], PharmD, BCPS 11/09/2020 12:14 PM

## 2020-11-10 LAB — MAGNESIUM: Magnesium: 2.1 mg/dL (ref 1.7–2.4)

## 2020-11-10 LAB — CBC WITH DIFFERENTIAL/PLATELET
Abs Immature Granulocytes: 0.08 10*3/uL — ABNORMAL HIGH (ref 0.00–0.07)
Basophils Absolute: 0.1 10*3/uL (ref 0.0–0.1)
Basophils Relative: 1 %
Eosinophils Absolute: 0.1 10*3/uL (ref 0.0–0.5)
Eosinophils Relative: 1 %
HCT: 38.2 % — ABNORMAL LOW (ref 39.0–52.0)
Hemoglobin: 12.8 g/dL — ABNORMAL LOW (ref 13.0–17.0)
Immature Granulocytes: 1 %
Lymphocytes Relative: 7 %
Lymphs Abs: 0.8 10*3/uL (ref 0.7–4.0)
MCH: 34.8 pg — ABNORMAL HIGH (ref 26.0–34.0)
MCHC: 33.5 g/dL (ref 30.0–36.0)
MCV: 103.8 fL — ABNORMAL HIGH (ref 80.0–100.0)
Monocytes Absolute: 1 10*3/uL (ref 0.1–1.0)
Monocytes Relative: 9 %
Neutro Abs: 9.3 10*3/uL — ABNORMAL HIGH (ref 1.7–7.7)
Neutrophils Relative %: 81 %
Platelets: 157 10*3/uL (ref 150–400)
RBC: 3.68 MIL/uL — ABNORMAL LOW (ref 4.22–5.81)
RDW: 13 % (ref 11.5–15.5)
WBC: 11.3 10*3/uL — ABNORMAL HIGH (ref 4.0–10.5)
nRBC: 0 % (ref 0.0–0.2)

## 2020-11-10 LAB — BASIC METABOLIC PANEL
Anion gap: 9 (ref 5–15)
BUN: 17 mg/dL (ref 6–20)
CO2: 25 mmol/L (ref 22–32)
Calcium: 8.7 mg/dL — ABNORMAL LOW (ref 8.9–10.3)
Chloride: 105 mmol/L (ref 98–111)
Creatinine, Ser: 0.97 mg/dL (ref 0.61–1.24)
GFR, Estimated: >60 mL/min (ref >60)
Glucose, Bld: 118 mg/dL — ABNORMAL HIGH (ref 70–99)
Potassium: 3.7 mmol/L (ref 3.5–5.1)
Sodium: 139 mmol/L (ref 135–145)

## 2020-11-10 LAB — CULTURE, RESPIRATORY W GRAM STAIN: Culture: NORMAL

## 2020-11-10 LAB — LEGIONELLA PNEUMOPHILA SEROGP 1 UR AG: L. pneumophila Serogp 1 Ur Ag: NEGATIVE

## 2020-11-10 MED ORDER — CEFDINIR 300 MG PO CAPS
300.0000 mg | ORAL_CAPSULE | Freq: Two times a day (BID) | ORAL | Status: DC
  Administered 2020-11-10 – 2020-11-11 (×3): 300 mg via ORAL
  Filled 2020-11-10 (×3): qty 1

## 2020-11-10 MED ORDER — IPRATROPIUM-ALBUTEROL 0.5-2.5 (3) MG/3ML IN SOLN
3.0000 mL | Freq: Two times a day (BID) | RESPIRATORY_TRACT | Status: DC
  Administered 2020-11-11: 3 mL via RESPIRATORY_TRACT
  Filled 2020-11-10: qty 3

## 2020-11-10 NOTE — Progress Notes (Signed)
PROGRESS NOTE    Charles Santos  FHL:[id number] DOB: 1971-11-01 DOA: 11/07/2020 PCP: Claretta Fraise, MD    Chief Complaint  Patient presents with  . Shortness of Breath  . Chest Pain  . Generalized Body Aches  . Hemoptysis    Brief Narrative:  HPI per Dr. Cloyde Reams is a 49 y.o. male with medical history significant of colon cancer status post chemo and subtotal colectomy, emphysema, hypertension, hyperlipidemia, GERD, anxiety, depression, tobacco use presenting to the ED with complaints of shortness of breath, pleuritic chest pain, and hemoptysis.  Patient stated he had been feeling bad for the past 1 day.  He was experiencing right-sided pleuritic chest pain whenever he takes deep breaths.  He is coughing a lot and cough is productive of blood-tinged sputum.  Denies fevers or chills.  States he vomited once earlier today after eating something and then vomited again after he received morphine in the ED.  Reports having chronic diarrhea/loose stools since he had colectomy done.  Denies abdominal pain.  No other complaints.  ED Course: Afebrile.  Tachycardic and tachypneic.  Not hypotensive.  SPO2 89% on room air, improved with 2 L supplemental oxygen.  WBC 15.7, hemoglobin 17.4, hematocrit 50.4, platelet 170K.  Sodium 132, potassium 3.3, chloride 92, bicarb 25, BUN 16, creatinine 0.9, glucose 107.  High-sensitivity troponin negative x2.  SARS-CoV-2 PCR test negative.  Influenza panel negative.  Blood culture pending.    CT angiogram chest negative for PE and showing right lower lobe consolidation most consistent with pneumonia.  Patient received ceftriaxone, azithromycin, 2 L LR boluses, morphine, and Zofran.   Assessment & Plan:   Principal Problem:   Sepsis ([place]) Active Problems:   Hyperlipidemia   Depression   Tobacco abuse   Hypertriglyceridemia   Essential hypertension   CAP (community acquired pneumonia)   Acute hypoxemic respiratory failure (HCC)    Hyponatremia   Hypokalemia  1 sepsis secondary to CAP/acute respiratory failure with hypoxia Patient presenting with pleuritic chest pain, shortness of breath, hemoptysis noted to be hypoxic on presentation on room air and currently requiring 3 L supplemental nasal cannula.  Patient met criteria for sepsis with tachycardia, tachypnea, leukocytosis, borderline blood pressure, right lower lobe infiltrate noted on CT angiogram of chest consistent with pneumonia.  SARS Covid 2 PCR test negative.  Influenza A and B negative.  Patient currently afebrile.  Leukocytosis trending down.  Patient also noted with some wheezing on examination with history of underlying tobacco use.  Urine Legionella antigen negative. Urine strep pneumococcus antigen positive.  Sputum Gram stain and cultures normal respiratory flora.  Currently on oral azithromycin.  Transition from IV Rocephin to oral Omnicef.  Continue scheduled duo nebs, Pulmicort, Flonase, Claritin, PPI.  Status post 1 dose IV Solu-Medrol.  Supportive care.   2.  Mild hyponatremia Likely secondary to poor oral intake in the setting of acute illness.  Improved with hydration.    3.  Mild hypokalemia/hypomagnesemia Repleted.  Potassium at 3.7.  Magnesium at 2.1.  Follow.   4.  Hypertension Blood pressure borderline on admission.  Blood pressure improved with hydration.  Continue to hold antihypertensive medications.  Saline lock IV fluids.  Follow.   5.  Gastroesophageal reflux disease PPI.   6.  Depression/anxiety Per MAR not on any medications prior to admission.  Outpatient follow-up.  7.  Hyperlipidemia Continue Lopid, Lovaza.    8.  Tobacco abuse Tobacco cessation stressed to patient.  Nicotine patch.   9.  Alcohol use  Patient with no noted significant alcohol dependence.  No signs of withdrawal.  Continue Ativan withdrawal protocol.  Follow.    DVT prophylaxis: Lovenox Code Status: Full Family Communication: Updated patient.  No family at  bedside. Disposition:   Status is: Inpatient    Dispo: The patient is from: Home              Anticipated d/c is to: Home              Anticipated d/c date is: 11/11/2020              Patient currently being transitioned from IV antibiotics to oral antibiotics.  Not stable for discharge.        Consultants:   None  Procedures:   CT angiogram chest 11/07/2020  Right upper quadrant ultrasound 11/08/2020  Antimicrobials:   IV Rocephin 11/07/2020>>> 11/10/2020  IV azithromycin 11/07/2020>>>>> oral azithromycin 08/01/2020  Omnicef 11/10/2020   Subjective: Patient laying in bed.  States he is feeling better.  States shortness of breath is improved.  States pleuritic right chest pain has improved significantly.  Stated ambulated in the hallway yesterday.  Objective: Vitals:   11/09/20 1344 11/09/20 2005 11/09/20 2100 11/10/20 0433  BP: 112/65  (!) 117/59 120/81  Pulse: 86  86 82  Resp: 20   17  Temp: 98.6 F (37 C)  99.1 F (37.3 C) 99.2 F (37.3 C)  TempSrc: Oral  Oral Oral  SpO2: 96% 96% 99% 97%  Weight:      Height:        Intake/Output Summary (Last 24 hours) at 11/10/2020 1033 Last data filed at 11/10/2020 0512 Gross per 24 hour  Intake 3968.8 ml  Output --  Net 3968.8 ml   Filed Weights   11/07/20 1558  Weight: 98 kg    Examination:  General exam: NAD Respiratory system: Decreasing coarse breath sounds right > left.  No crackles.  No wheezing.  Fair air movement.  Speaking in full sentences.  No use of accessory muscles of respiration.  Cardiovascular system: RRR no murmurs rubs or gallops.  No JVD.  No lower extremity edema.  Gastrointestinal system: Abdomen is soft, nontender, nondistended, positive bowel sounds.  No rebound.  No guarding.  Central nervous system: Alert and oriented. No focal neurological deficits. Extremities: Symmetric 5 x 5 power. Skin: No rashes, lesions or ulcers Psychiatry: Judgement and insight appear normal. Mood &  affect appropriate.     Data Reviewed: I have personally reviewed following labs and imaging studies  CBC: Recent Labs  Lab 11/07/20 1629 11/08/20 0456 11/09/20 0609 11/10/20 0430  WBC 15.7* 6.4 11.0* 11.3*  NEUTROABS  --   --  10.2* 9.3*  HGB 17.4* 15.8 13.5 12.8*  HCT 50.4 47.1 40.1 38.2*  MCV 102.2* 104.9* 103.1* 103.8*  PLT 170 151 150 269    Basic Metabolic Panel: Recent Labs  Lab 11/07/20 1629 11/08/20 0456 11/08/20 0807 11/09/20 0609 11/10/20 0430  NA 132* 133*  --  136 139  K 3.3* 3.7  --  3.6 3.7  CL 92* 96*  --  99 105  CO2 25 24  --  26 25  GLUCOSE 107* 93  --  143* 118*  BUN 16 20  --  19 17  CREATININE 0.95 1.07  --  0.94 0.97  CALCIUM 9.3 8.4*  --  8.6* 8.7*  MG 1.5*  --  1.7 2.7* 2.1  PHOS  --   --  5.3*  --   --  GFR: Estimated Creatinine Clearance: 109.3 mL/min (by C-G formula based on SCr of 0.97 mg/dL).  Liver Function Tests: Recent Labs  Lab 11/07/20 1629 11/09/20 0609  AST 45* 29  ALT 33 20  ALKPHOS 57 40  BILITOT 1.8* 0.7  PROT 7.6 6.3*  ALBUMIN 3.7 2.7*    CBG: No results for input(s): GLUCAP in the last 168 hours.   Recent Results (from the past 240 hour(s))  Resp Panel by RT-PCR (Flu A&B, Covid) Nasopharyngeal Swab     Status: None   Collection Time: 11/07/20  4:29 PM   Specimen: Nasopharyngeal Swab; Nasopharyngeal(NP) swabs in vial transport medium  Result Value Ref Range Status   SARS Coronavirus 2 by RT PCR NEGATIVE NEGATIVE Final    Comment: (NOTE) SARS-CoV-2 target nucleic acids are NOT DETECTED.  The SARS-CoV-2 RNA is generally detectable in upper respiratory specimens during the acute phase of infection. The lowest concentration of SARS-CoV-2 viral copies this assay can detect is 138 copies/mL. A negative result does not preclude SARS-Cov-2 infection and should not be used as the sole basis for treatment or other patient management decisions. A negative result may occur with  improper specimen  collection/handling, submission of specimen other than nasopharyngeal swab, presence of viral mutation(s) within the areas targeted by this assay, and inadequate number of viral copies(<138 copies/mL). A negative result must be combined with clinical observations, patient history, and epidemiological information. The expected result is Negative.  Fact Sheet for Patients:  EntrepreneurPulse.com.au  Fact Sheet for Healthcare Providers:  IncredibleEmployment.be  This test is no t yet approved or cleared by the [place] FDA and  has been authorized for detection and/or diagnosis of SARS-CoV-2 by FDA under an Emergency Use Authorization (EUA). This EUA will remain  in effect (meaning this test can be used) for the duration of the COVID-19 declaration under Section 564(b)(1) of the Act, 21 U.S.C.section 360bbb-3(b)(1), unless the authorization is terminated  or revoked sooner.       Influenza A by PCR NEGATIVE NEGATIVE Final   Influenza B by PCR NEGATIVE NEGATIVE Final    Comment: (NOTE) The Xpert Xpress SARS-CoV-2/FLU/RSV plus assay is intended as an aid in the diagnosis of influenza from Nasopharyngeal swab specimens and should not be used as a sole basis for treatment. Nasal washings and aspirates are unacceptable for Xpert Xpress SARS-CoV-2/FLU/RSV testing.  Fact Sheet for Patients: EntrepreneurPulse.com.au  Fact Sheet for Healthcare Providers: IncredibleEmployment.be  This test is not yet approved or cleared by the [place] FDA and has been authorized for detection and/or diagnosis of SARS-CoV-2 by FDA under an Emergency Use Authorization (EUA). This EUA will remain in effect (meaning this test can be used) for the duration of the COVID-19 declaration under Section 564(b)(1) of the Act, 21 U.S.C. section 360bbb-3(b)(1), unless the authorization is terminated or revoked.  Performed at [facility], [place] [address] [place], [place] [address]   Culture, blood (routine x 2)     Status: None (Preliminary result)   Collection Time: 11/07/20  9:50 PM   Specimen: BLOOD LEFT HAND  Result Value Ref Range Status   Specimen Description   Final    BLOOD LEFT HAND Performed at [facility] [address], [place], [place] [address]    Special Requests   Final    BOTTLES DRAWN AEROBIC AND ANAEROBIC Blood Culture adequate volume Performed at [facility] [address], [place], [place] [address]    Culture  Final    NO GROWTH 1 DAY Performed at [facility], [place] [address] [place], [place] [address]    Report Status PENDING  Incomplete  Culture, blood (routine x 2)     Status: None (Preliminary result)   Collection Time: 11/07/20  9:50 PM   Specimen: BLOOD RIGHT HAND  Result Value Ref Range Status   Specimen Description   Final    BLOOD RIGHT HAND Performed at [facility] [address], [place], [place] [address]    Special Requests   Final    BOTTLES DRAWN AEROBIC AND ANAEROBIC Blood Culture adequate volume Performed at [facility] [address], [place], [place] [address]    Culture   Final    NO GROWTH 1 DAY Performed at [facility], [place] [address] [place], [place] [address]    Report Status PENDING  Incomplete  Culture, Urine     Status: None   Collection Time: 11/08/20  7:59 AM   Specimen: Urine, Catheterized  Result Value Ref Range Status   Specimen Description   Final    URINE, CATHETERIZED Performed at [facility] [address], [place], [place] [address]    Special Requests   Final    NONE Performed at [facility], [place] [address] [place], [place] [address]    Culture   Final    NO GROWTH Performed at [facility], [place] [address], [place], [place] [address]    Report Status 11/09/2020 FINAL   Final  Expectorated sputum assessment w rflx to resp cult     Status: None   Collection Time: 11/08/20 11:26 AM   Specimen: Sputum  Result Value Ref Range Status   Specimen Description SPUTUM  Final   Special Requests EXPECTORATED  Final   Sputum evaluation   Final    THIS SPECIMEN IS ACCEPTABLE FOR SPUTUM CULTURE Performed at [facility], [place] [address] [place], [place] [address]    Report Status 11/08/2020 FINAL  Final  Culture, respiratory     Status: None (Preliminary result)   Collection Time: 11/08/20 11:26 AM   Specimen: SPU  Result Value Ref Range Status   Specimen Description   Final    SPUTUM Performed at [facility] [address] [place], [place] [address]    Special Requests   Final    EXPECTORATED Reflexed from [id number] Performed at [facility], [place] [address] [place], [place] [address]    Gram Stain   Final    RARE WBC PRESENT, PREDOMINANTLY PMN MODERATE GRAM NEGATIVE RODS FEW GRAM POSITIVE COCCI RARE SQUAMOUS EPITHELIAL CELLS PRESENT    Culture   Final    FEW Normal respiratory flora-no Staph aureus or Pseudomonas seen Performed at [facility], [address], [place], [place] [address]    Report Status PENDING  Incomplete         Radiology Studies: No results found.      Scheduled Meds: . atorvastatin  40 mg Oral Daily  . azithromycin  500 mg Oral QHS  . budesonide (PULMICORT) nebulizer solution  0.5 mg Nebulization BID  . cefdinir  300 mg Oral Q12H  . fluticasone  2 spray Each Nare Daily  . folic acid  1 mg Oral Daily  . gemfibrozil  600 mg Oral BID AC  . guaiFENesin  1,200 mg Oral BID  . ipratropium-albuterol  3 mL Nebulization  TID  . loratadine  10 mg Oral Daily  . multivitamin with minerals  1 tablet Oral Daily  . nicotine  21 mg Transdermal Daily  . omega-3 acid ethyl esters  1 capsule Oral Daily  . pantoprazole  40 mg Oral Q0600  . thiamine  100 mg Oral Daily   Or   . thiamine  100 mg Intravenous Daily   Continuous Infusions:    LOS: 3 days    Time spent: 35 minutes    Irine Seal, MD [facility]   To contact the attending provider between 7A-7P or the covering provider during after hours 7P-7A, please log into the web site [email or web address] and access using universal [place] password for that web site. If you do not have the password, please call the hospital operator.  11/10/2020, 10:33 AM

## 2020-11-10 NOTE — Progress Notes (Signed)
Patient complained of neck and shoulder pain, administered tylenol. Encouraged patient to continue using IS to help improve lung function in addition to ambulation.  Jerene Pitch

## 2020-11-11 LAB — CBC
HCT: 43.4 % (ref 39.0–52.0)
Hemoglobin: 14.5 g/dL (ref 13.0–17.0)
MCH: 35 pg — ABNORMAL HIGH (ref 26.0–34.0)
MCHC: 33.4 g/dL (ref 30.0–36.0)
MCV: 104.8 fL — ABNORMAL HIGH (ref 80.0–100.0)
Platelets: 178 10*3/uL (ref 150–400)
RBC: 4.14 MIL/uL — ABNORMAL LOW (ref 4.22–5.81)
RDW: 13.2 % (ref 11.5–15.5)
WBC: 7.6 10*3/uL (ref 4.0–10.5)
nRBC: 0 % (ref 0.0–0.2)

## 2020-11-11 LAB — BASIC METABOLIC PANEL
Anion gap: 10 (ref 5–15)
BUN: 13 mg/dL (ref 6–20)
CO2: 26 mmol/L (ref 22–32)
Calcium: 9.3 mg/dL (ref 8.9–10.3)
Chloride: 104 mmol/L (ref 98–111)
Creatinine, Ser: 0.96 mg/dL (ref 0.61–1.24)
GFR, Estimated: >60 mL/min (ref >60)
Glucose, Bld: 111 mg/dL — ABNORMAL HIGH (ref 70–99)
Potassium: 3.7 mmol/L (ref 3.5–5.1)
Sodium: 140 mmol/L (ref 135–145)

## 2020-11-11 MED ORDER — THIAMINE HCL 100 MG PO TABS
100.0000 mg | ORAL_TABLET | Freq: Every day | ORAL | Status: DC
Start: 2020-11-12 — End: 2021-04-22

## 2020-11-11 MED ORDER — GUAIFENESIN ER 600 MG PO TB12: 1200.0000 mg | ORAL_TABLET | Freq: Two times a day (BID) | ORAL | 0 refills | Status: AC

## 2020-11-11 MED ORDER — COMBIVENT RESPIMAT 20-100 MCG/ACT IN AERS: 1.0000 | INHALATION_SPRAY | Freq: Four times a day (QID) | RESPIRATORY_TRACT | 1 refills | Status: DC | PRN

## 2020-11-11 MED ORDER — FLUTICASONE PROPIONATE 50 MCG/ACT NA SUSP: 2.0000 | Freq: Every day | NASAL | 0 refills | Status: DC

## 2020-11-11 MED ORDER — NICOTINE 21 MG/24HR TD PT24
21.0000 mg | MEDICATED_PATCH | Freq: Every day | TRANSDERMAL | 0 refills | Status: DC
Start: 2020-11-12 — End: 2021-02-07

## 2020-11-11 MED ORDER — FOLIC ACID 1 MG PO TABS
1.0000 mg | ORAL_TABLET | Freq: Every day | ORAL | Status: DC
Start: 2020-11-12 — End: 2020-12-22

## 2020-11-11 MED ORDER — CHLORTHALIDONE 25 MG PO TABS
25.0000 mg | ORAL_TABLET | Freq: Every day | ORAL | Status: DC
  Administered 2020-11-11: 25 mg via ORAL
  Filled 2020-11-11: qty 1

## 2020-11-11 MED ORDER — CEFDINIR 300 MG PO CAPS: 300.0000 mg | ORAL_CAPSULE | Freq: Two times a day (BID) | ORAL | 0 refills | Status: AC

## 2020-11-11 MED ORDER — PANTOPRAZOLE SODIUM 40 MG PO TBEC
40.0000 mg | DELAYED_RELEASE_TABLET | Freq: Every day | ORAL | 1 refills | Status: DC
Start: 2020-11-12 — End: 2021-01-26

## 2020-11-11 MED ORDER — POTASSIUM CHLORIDE CRYS ER 20 MEQ PO TBCR
20.0000 meq | EXTENDED_RELEASE_TABLET | Freq: Three times a day (TID) | ORAL | Status: DC
  Administered 2020-11-11: 20 meq via ORAL
  Filled 2020-11-11: qty 1

## 2020-11-11 MED ORDER — LORATADINE 10 MG PO TABS
10.0000 mg | ORAL_TABLET | Freq: Every day | ORAL | 0 refills | Status: DC
Start: 2020-11-12 — End: 2021-04-22

## 2020-11-11 MED ORDER — ADULT MULTIVITAMIN W/MINERALS CH: 1.0000 | ORAL_TABLET | Freq: Every day | ORAL | Status: AC

## 2020-11-11 NOTE — Discharge Summary (Addendum)
Physician Discharge Summary  Charles Santos PTW:[id number] DOB: [birth year] DOA: 11/07/2020  PCP: Claretta Fraise, MD  Admit date: 11/07/2020 Discharge date: 11/11/2020  Time spent: 50 minutes  Recommendations for Outpatient Follow-up:  1. Follow-up with Claretta Fraise, MD in 2 weeks.  On follow-up patient will need a basic metabolic profile, magnesium level checked to follow-up on electrolytes and renal function.   Discharge Diagnoses:  Principal Problem:   Sepsis ([place]) Active Problems:   Hyperlipidemia   Depression   Tobacco abuse   Hypertriglyceridemia   Essential hypertension   CAP (community acquired pneumonia)   Acute hypoxemic respiratory failure (HCC)   Hyponatremia   Hypokalemia   Discharge Condition: Stable and improved  Diet recommendation: Regular  Filed Weights   11/07/20 1558  Weight: 98 kg    History of present illness:  HPI per Dr. Cloyde Reams is a 49 y.o. male with medical history significant of colon cancer status post chemo and subtotal colectomy, emphysema, hypertension, hyperlipidemia, GERD, anxiety, depression, tobacco use presented to the ED with complaints of shortness of breath, pleuritic chest pain, and hemoptysis.  Patient stated he had been feeling bad for the past 1 day.  He was experiencing right-sided pleuritic chest pain whenever he takes deep breaths.  He was coughing a lot and cough was productive of blood-tinged sputum.  Denied fevers or chills.  Stated he vomited once earlier today after eating something and then vomited again after he received morphine in the ED.  Reports having chronic diarrhea/loose stools since he had colectomy done.  Denies abdominal pain.  No other complaints.  ED Course: Afebrile.  Tachycardic and tachypneic.  Not hypotensive.  SPO2 89% on room air, improved with 2 L supplemental oxygen.  WBC 15.7, hemoglobin 17.4, hematocrit 50.4, platelet 170K.  Sodium 132, potassium 3.3, chloride 92, bicarb 25, BUN 16,  creatinine 0.9, glucose 107.  High-sensitivity troponin negative x2.  SARS-CoV-2 PCR test negative.  Influenza panel negative.  Blood culture pending.    CT angiogram chest negative for PE and showing right lower lobe consolidation most consistent with pneumonia.  Patient received ceftriaxone, azithromycin, 2 L LR boluses, morphine, and Zofran.  Hospital Course:  1 Severe sepsis secondary to CAP(Streptococcus pneumoniae)/acute respiratory failure with hypoxia Patient presented with pleuritic chest pain, shortness of breath, hemoptysis noted to be hypoxic on presentation on room air and  requiring 3 L supplemental nasal cannula early on during the hospitalization..  Patient met criteria for sepsis with tachycardia, tachypnea, leukocytosis, borderline blood pressure, right lower lobe infiltrate noted on CT angiogram of chest consistent with pneumonia.  SARS Covid 2 PCR test negative.  Influenza A and B negative.  Patient remained afebrile for the rest of the hospitalization.  Leukocytosis trended down and had resolved by day of discharge.  Initially placed empirically on IV Rocephin and IV azithromycin.   Patient also noted with some wheezing on examination with history of underlying tobacco use.  Urine Legionella antigen negative. Urine strep pneumococcus antigen positive.  Sputum Gram stain and cultures normal respiratory flora.    Patient given a dose of IV Solu-Medrol with resolution of wheezing, placed on Pulmicort, scheduled duo nebs, Flonase, Claritin, PPI.  Patient improved clinically and IV antibiotics with transition to oral azithromycin and oral Omnicef which patient tolerated.  Patient's hypoxia had resolved by day of discharge.  Patient was discharged home on 3 more days of oral Omnicef to complete a 7-day course of treatment, as well as scheduled Combivent for 3 to 4  days and then as needed.  Patient will be discharged in stable and improved condition.  Outpatient follow-up with PCP.   2.   Mild hyponatremia Likely secondary to poor oral intake in the setting of acute illness.    Resolved with hydration.    3.  Mild hypokalemia/hypomagnesemia Repleted.   4.  Hypertension Blood pressure borderline on admission.  Blood pressure improved with hydration.    Antihypertensive medications were held and will be resumed on discharge.  Outpatient follow-up with PCP.    5.  Gastroesophageal reflux disease Patient maintained on a PPI.   6.  Depression/anxiety Per MAR not on any medications prior to admission.  Outpatient follow-up.  7.  Hyperlipidemia Patient maintained on home regimen of Lopid, Lovaza.    8.  Tobacco abuse Tobacco cessation stressed to patient.    Patient was placed on a nicotine patch.   9.  Alcohol use Patient with no noted significant alcohol dependence.  No signs of withdrawal.  Patient maintained on Ativan withdrawal protocol during the hospitalization, thiamine, folic acid.  Patient had no DTs.  Outpatient follow-up.  Procedures:  CT angiogram chest 11/07/2020  Right upper quadrant ultrasound 11/08/2020  Consultations:  None  Discharge Exam: Vitals:   11/11/20 0639 11/11/20 0906  BP: (!) 147/95   Pulse: 84   Resp: 20   Temp: 98.6 F (37 C)   SpO2: 95% 95%    General: NAD Cardiovascular: RRR Respiratory: Decreased coarse breath sounds on the right.  No wheezing.  No crackles.  Fair air movement.  No use of accessory muscles of respiration.  Discharge Instructions   Discharge Instructions    Diet general   Complete by: As directed    Increase activity slowly   Complete by: As directed      Allergies as of 11/11/2020      Reactions   Adhesive [tape] Rash, Other (See Comments)   Prefers paper tape      Medication List    STOP taking these medications   tiZANidine 4 MG tablet Commonly known as: ZANAFLEX     TAKE these medications   atorvastatin 40 MG tablet Commonly known as: LIPITOR Take 1 tablet (40 mg total) by  mouth daily.   cefdinir 300 MG capsule Commonly known as: OMNICEF Take 1 capsule (300 mg total) by mouth every 12 (twelve) hours for 3 days.   chlorthalidone 25 MG tablet Commonly known as: HYGROTON Take 1 tablet (25 mg total) by mouth daily.   Combivent Respimat 20-100 MCG/ACT Aers respimat Generic drug: Ipratropium-Albuterol Inhale 1 puff into the lungs every 6 (six) hours as needed for wheezing or shortness of breath. Use 3 times daily x 4 days then every 6 hours as needed.   Fish Oil 1000 MG Cpdr Take 1 capsule by mouth daily.   fluticasone 50 MCG/ACT nasal spray Commonly known as: FLONASE Place 2 sprays into both nostrils daily for 7 days. Start taking on: November 12, 4314   folic acid 1 MG tablet Commonly known as: FOLVITE Take 1 tablet (1 mg total) by mouth daily. Start taking on: November 12, 2020   gabapentin 300 MG capsule Commonly known as: NEURONTIN 1 at bedtime for1 week then 2  The next  week then 3 the next week then 4 daily.   gemfibrozil 600 MG tablet Commonly known as: LOPID TAKE  (1)  TABLET TWICE A DAY WITH MEALS (BREAKFAST AND SUPPER) What changed:   how much to take  how to take  this  when to take this   guaiFENesin 600 MG 12 hr tablet Commonly known as: MUCINEX Take 2 tablets (1,200 mg total) by mouth 2 (two) times daily for 4 days.   loratadine 10 MG tablet Commonly known as: CLARITIN Take 1 tablet (10 mg total) by mouth daily. Start taking on: November 12, 2020   multivitamin with minerals Tabs tablet Take 1 tablet by mouth daily. Start taking on: November 12, 2020   naproxen 500 MG tablet Commonly known as: NAPROSYN Take 1 tablet (500 mg total) by mouth 2 (two) times daily.   nicotine 21 mg/24hr patch Commonly known as: NICODERM CQ - dosed in mg/24 hours Place 1 patch (21 mg total) onto the skin daily. Start taking on: November 12, 2020   pantoprazole 40 MG tablet Commonly known as: PROTONIX Take 1 tablet (40 mg total) by mouth  daily at 6 (six) AM. Start taking on: November 12, 2020   potassium chloride SA 20 MEQ tablet Commonly known as: Klor-Con M20 Take 1 tablet (20 mEq total) by mouth 3 (three) times daily.   thiamine 100 MG tablet Take 1 tablet (100 mg total) by mouth daily. Start taking on: November 12, 2020      Allergies  Allergen Reactions  . Adhesive [Tape] Rash and Other (See Comments)    Prefers paper tape    Follow-up Information    Stacks, Cletus Gash, MD. Schedule an appointment as soon as possible for a visit in 2 week(s).   Specialty: Family Medicine Contact information: [place] [address] [phone]                The results of significant diagnostics from this hospitalization (including imaging, microbiology, ancillary and laboratory) are listed below for reference.    Significant Diagnostic Studies: CT Angio Chest PE W and/or Wo Contrast  Result Date: 11/07/2020 CLINICAL DATA:  49 year old male with concern for pulmonary embolism. EXAM: CT ANGIOGRAPHY CHEST WITH CONTRAST TECHNIQUE: Multidetector CT imaging of the chest was performed using the standard protocol during bolus administration of intravenous contrast. Multiplanar CT image reconstructions and MIPs were obtained to evaluate the vascular anatomy. CONTRAST:  53m OMNIPAQUE IOHEXOL 350 MG/ML SOLN COMPARISON:  None. FINDINGS: Evaluation of this exam is limited due to respiratory motion artifact. Cardiovascular: There is no cardiomegaly or pericardial effusion. The thoracic aorta is unremarkable. Evaluation of the pulmonary arteries is very limited due to respiratory motion artifact and suboptimal opacification. No definite large central pulmonary artery embolus identified. Mediastinum/Nodes: Top-normal right infrahilar or subcarinal lymph nodes measuring 10 mm (142/5). The esophagus and the thyroid gland are grossly unremarkable. No mediastinal fluid collection. Lungs/Pleura: Patchy area of consolidation in the  right lower lobe most consistent with pneumonia. Clinical correlation and follow-up to resolution recommended. The left lung is clear. There is no pleural effusion or pneumothorax. Upper Abdomen: No acute abnormality. Musculoskeletal: No chest wall abnormality. No acute or significant osseous findings. Review of the MIP images confirms the above findings. IMPRESSION: 1. No CT evidence of central pulmonary artery embolus. 2. Right lower lobe pneumonia. Clinical correlation and follow-up to resolution recommended. Electronically Signed   By: AAnner CreteM.D.   On: 11/07/2020 19:23   UKoreaAbdomen Limited RUQ (LIVER/GB)  Result Date: 11/08/2020 CLINICAL DATA:  Elevated liver enzymes EXAM: ULTRASOUND ABDOMEN LIMITED RIGHT UPPER QUADRANT COMPARISON:  CT abdomen and pelvis June 29, 2020 FINDINGS: Gallbladder: No gallstones or wall thickening visualized. There is no pericholecystic fluid. No sonographic Murphy sign  noted by sonographer. Common bile duct: Diameter: 5 mm. No intrahepatic or extrahepatic biliary duct dilatation. Liver: No focal lesion identified. Within normal limits in parenchymal echogenicity. Portal vein is patent on color Doppler imaging with normal direction of blood flow towards the liver. Other: None. IMPRESSION: Study within normal limits. Electronically Signed   By: Lowella Grip III M.D.   On: 11/08/2020 09:56    Microbiology: Recent Results (from the past 240 hour(s))  Resp Panel by RT-PCR (Flu A&B, Covid) Nasopharyngeal Swab     Status: None   Collection Time: 11/07/20  4:29 PM   Specimen: Nasopharyngeal Swab; Nasopharyngeal(NP) swabs in vial transport medium  Result Value Ref Range Status   SARS Coronavirus 2 by RT PCR NEGATIVE NEGATIVE Final    Comment: (NOTE) SARS-CoV-2 target nucleic acids are NOT DETECTED.  The SARS-CoV-2 RNA is generally detectable in upper respiratory specimens during the acute phase of infection. The lowest concentration of SARS-CoV-2 viral copies  this assay can detect is 138 copies/mL. A negative result does not preclude SARS-Cov-2 infection and should not be used as the sole basis for treatment or other patient management decisions. A negative result may occur with  improper specimen collection/handling, submission of specimen other than nasopharyngeal swab, presence of viral mutation(s) within the areas targeted by this assay, and inadequate number of viral copies(<138 copies/mL). A negative result must be combined with clinical observations, patient history, and epidemiological information. The expected result is Negative.  Fact Sheet for Patients:  EntrepreneurPulse.com.au  Fact Sheet for Healthcare Providers:  IncredibleEmployment.be  This test is no t yet approved or cleared by the [place] FDA and  has been authorized for detection and/or diagnosis of SARS-CoV-2 by FDA under an Emergency Use Authorization (EUA). This EUA will remain  in effect (meaning this test can be used) for the duration of the COVID-19 declaration under Section 564(b)(1) of the Act, 21 U.S.C.section 360bbb-3(b)(1), unless the authorization is terminated  or revoked sooner.       Influenza A by PCR NEGATIVE NEGATIVE Final   Influenza B by PCR NEGATIVE NEGATIVE Final    Comment: (NOTE) The Xpert Xpress SARS-CoV-2/FLU/RSV plus assay is intended as an aid in the diagnosis of influenza from Nasopharyngeal swab specimens and should not be used as a sole basis for treatment. Nasal washings and aspirates are unacceptable for Xpert Xpress SARS-CoV-2/FLU/RSV testing.  Fact Sheet for Patients: EntrepreneurPulse.com.au  Fact Sheet for Healthcare Providers: IncredibleEmployment.be  This test is not yet approved or cleared by the [place] FDA and has been authorized for detection and/or diagnosis of SARS-CoV-2 by FDA under an Emergency Use Authorization (EUA). This EUA  will remain in effect (meaning this test can be used) for the duration of the COVID-19 declaration under Section 564(b)(1) of the Act, 21 U.S.C. section 360bbb-3(b)(1), unless the authorization is terminated or revoked.  Performed at [facility], [place] [address], [place], [place] [address]   Culture, blood (routine x 2)     Status: None (Preliminary result)   Collection Time: 11/07/20  9:50 PM   Specimen: BLOOD LEFT HAND  Result Value Ref Range Status   Specimen Description   Final    BLOOD LEFT HAND Performed at [facility] [address] [place], [place] [address]    Special Requests   Final    BOTTLES DRAWN AEROBIC AND ANAEROBIC Blood Culture adequate volume Performed at [facility] [address], [place], [place] [address]  Culture   Final    NO GROWTH 3 DAYS Performed at [facility], [place] [address], [place], [place] [address]    Report Status PENDING  Incomplete  Culture, blood (routine x 2)     Status: None (Preliminary result)   Collection Time: 11/07/20  9:50 PM   Specimen: BLOOD RIGHT HAND  Result Value Ref Range Status   Specimen Description   Final    BLOOD RIGHT HAND Performed at [facility] [address] [place], [place] [address]    Special Requests   Final    BOTTLES DRAWN AEROBIC AND ANAEROBIC Blood Culture adequate volume Performed at [facility] [address], [place], [place] [address]    Culture   Final    NO GROWTH 3 DAYS Performed at [facility], [place] [address] [place], [place] [address]    Report Status PENDING  Incomplete  Culture, Urine     Status: None   Collection Time: 11/08/20  7:59 AM   Specimen: Urine, Catheterized  Result Value Ref Range Status   Specimen Description   Final    URINE, CATHETERIZED Performed at [facility] [address] [place], [place] [address]    Special Requests   Final     NONE Performed at [facility], [place] [address], [place], [place] [address]    Culture   Final    NO GROWTH Performed at [facility], [place] [address], [place], [place] [address]    Report Status 11/09/2020 FINAL  Final  Expectorated sputum assessment w rflx to resp cult     Status: None   Collection Time: 11/08/20 11:26 AM   Specimen: Sputum  Result Value Ref Range Status   Specimen Description SPUTUM  Final   Special Requests EXPECTORATED  Final   Sputum evaluation   Final    THIS SPECIMEN IS ACCEPTABLE FOR SPUTUM CULTURE Performed at [facility], [place] [address] [place], [place] [address]    Report Status 11/08/2020 FINAL  Final  Culture, respiratory     Status: None   Collection Time: 11/08/20 11:26 AM   Specimen: SPU  Result Value Ref Range Status   Specimen Description   Final    SPUTUM Performed at [facility] [address], [place], [place] [address]    Special Requests   Final    EXPECTORATED Reflexed from [phone] Performed at [facility], [place] [address], [place], [place] [address]    Gram Stain   Final    RARE WBC PRESENT, PREDOMINANTLY PMN MODERATE GRAM NEGATIVE RODS FEW GRAM POSITIVE COCCI RARE SQUAMOUS EPITHELIAL CELLS PRESENT    Culture   Final    FEW Normal respiratory flora-no Staph aureus or Pseudomonas seen Performed at [facility], [address], [place],  [address]    Report Status 11/10/2020 FINAL  Final     Labs: Basic Metabolic Panel: Recent Labs  Lab 11/07/20 1629 11/08/20 0456 11/08/20 0807 11/09/20 0609 11/10/20 0430 11/11/20 0518  NA 132* 133*  --  136 139 140  K 3.3* 3.7  --  3.6 3.7 3.7  CL 92* 96*  --  99 105 104  CO2 25 24  --  _0 GLUCOSE 107* 93  --  143* 118* 111*  BUN 16 20  --  _1 CREATININE 0.95 1.07  --  0.94 0.97 0.96  CALCIUM 9.3 8.4*  --  8.6* 8.7* 9.3  MG 1.5*  --  1.7 2.7* 2.1  --   PHOS  --   --   5.3*  --   --   --    Liver Function Tests: Recent Labs  Lab 11/07/20 1629 11/09/20 0609  AST 45* 29  ALT 33 20  ALKPHOS 57 40  BILITOT 1.8* 0.7  PROT 7.6 6.3*  ALBUMIN 3.7 2.7*   Recent Labs  Lab 11/07/20 1629  LIPASE 25   No results for input(s): AMMONIA in the last 168 hours. CBC: Recent Labs  Lab 11/07/20 1629 11/08/20 0456 11/09/20 0609 11/10/20 0430 11/11/20 0518  WBC 15.7* 6.4 11.0* 11.3* 7.6  NEUTROABS  --   --  10.2* 9.3*  --   HGB 17.4* 15.8 13.5 12.8* 14.5  HCT 50.4 47.1 40.1 38.2* 43.4  MCV 102.2* 104.9* 103.1* 103.8* 104.8*  PLT 170 151 150 157 178   Cardiac Enzymes: No results for input(s): CKTOTAL, CKMB, CKMBINDEX, TROPONINI in the last 168 hours. BNP: BNP (last 3 results) No results for input(s): BNP in the last 8760 hours.  ProBNP (last 3 results) No results for input(s): PROBNP in the last 8760 hours.  CBG: No results for input(s): GLUCAP in the last 168 hours.     Signed:  Irine Seal MD.  [facility] 11/11/2020, 12:12 PM

## 2020-11-12 ENCOUNTER — Telehealth: Payer: Self-pay

## 2020-11-12 NOTE — Telephone Encounter (Signed)
Pt scheduled with Je for hospital follow up 11/24/20 at 8:30.

## 2020-11-13 LAB — CULTURE, BLOOD (ROUTINE X 2)
Culture: NO GROWTH
Culture: NO GROWTH
Special Requests: ADEQUATE
Special Requests: ADEQUATE

## 2020-11-20 ENCOUNTER — Other Ambulatory Visit: Payer: Self-pay | Admitting: Family Medicine

## 2020-11-24 ENCOUNTER — Other Ambulatory Visit: Payer: Self-pay

## 2020-11-24 ENCOUNTER — Encounter: Payer: Self-pay | Admitting: Nurse Practitioner

## 2020-11-24 ENCOUNTER — Ambulatory Visit (INDEPENDENT_AMBULATORY_CARE_PROVIDER_SITE_OTHER): Payer: 59 | Admitting: Nurse Practitioner

## 2020-11-24 VITALS — BP 93/58 | HR 71 | Temp 97.5°F | Resp 20 | Ht 70.0 in | Wt 211.0 lb

## 2020-11-24 DIAGNOSIS — Z09 Encounter for follow-up examination after completed treatment for conditions other than malignant neoplasm: Secondary | ICD-10-CM | POA: Diagnosis not present

## 2020-11-24 DIAGNOSIS — J189 Pneumonia, unspecified organism: Secondary | ICD-10-CM

## 2020-11-24 MED ORDER — POTASSIUM CHLORIDE CRYS ER 20 MEQ PO TBCR: EXTENDED_RELEASE_TABLET | ORAL | 2 refills | Status: DC

## 2020-11-24 NOTE — Assessment & Plan Note (Signed)
And admitted to the hospital for pneumonia, hypoxia and respiratory failure in the last 2 weeks.  Patient is rapidly improving.  No signs and symptoms of infection or hypoxia.  Patient is afebrile in clinic today.  No wheezing, shortness of breath. Provided education to patient with printed handouts given. Follow-up in 4 weeks repeat chest x-ray to rule out pneumonia.  Rx refill sent to pharmacy.

## 2020-11-24 NOTE — Progress Notes (Addendum)
Established Patient Office Visit  Subjective:  Patient ID: Charles Santos, male    DOB: February 23, 1971  Age: 49 y.o. MRN: [id number]  CC:  Chief Complaint  Patient presents with   Hospitalization Follow-up    [facility] - CAP - [facility]     HPI Elian Gloster is a 49 year old male who presents to clinic for hospital discharge follow-up.  Patient has a history of colon cancer status post chemo therapy and subtotal colectomy, hypertension, hyperlipidemia, GERD, emphysema, anxiety, depression, and tobacco use.  Patient presented to the emergency department complaining of shortness of breath, chest pain and hemoptysis. Patient was assessed and noted to have hypoxia on presentation on room air and requiring 3L supplemental oxygen via nasal cannula early on during hospitalization.  Patient was diagnosed with urine strep pneumococcus antigen positive.  Treated with IV Solu-Medrol and IV antibiotics.  Patient was transitioned to oral azithromycin and oral Omnicef after completing IV infusion.  Patient stabilized and was discharged from the hospital with improved condition.   Today patient is reporting increased strength, denies fever, shortness of breath, wheezing, headache, nausea or vomiting.  I completed hospital discharge instruction with patient with medication reconciliation.  Patient verbalized understanding.         Past Medical History:  Diagnosis Date   Anxiety    Cancer of transverse colon ([place]) 09/17/2016   surgery done   Colon cancer [facility]) 1998   surgery and chemo   Complication of anesthesia    not fully under for polyp removal at [place] 2017   Depression    Emphysema of lung ([place])    Family hx of colon cancer    GERD (gastroesophageal reflux disease)    none recent   Headache    Hyperlipidemia    Hypertension    Vitamin D deficiency     Past Surgical History:  Procedure Laterality Date   APPENDECTOMY     colon resectomy  1998   2 degree herida   COLONOSCOPY      ESOPHAGOGASTRODUODENOSCOPY (EGD) WITH PROPOFOL N/A 05/19/2017   Procedure: ESOPHAGOGASTRODUODENOSCOPY (EGD) WITH PROPOFOL;  Surgeon: [name], MD;  Location: [facility] ENDOSCOPY;  Service: Endoscopy;  Laterality: N/A;   FLEXIBLE SIGMOIDOSCOPY N/A 05/19/2017   Procedure: FLEXIBLE SIGMOIDOSCOPY;  Surgeon: [name], MD;  Location: [facility] ENDOS[facility];  Service: Endoscopy;  Laterality: N/A;   INGUINAL HERNIA REPAIR     LAPAROSCOPIC SUBTOTAL COLECTOMY N/A 09/17/2016   Procedure: DIAGNOSTIC LAPAROSCOPY EXPLORATORY LAPAROTOMY SUBTOTAL COLECTOMY PROCTOSCOPY;  Surgeon: [name], MD;  Location: [facility] ORS;  Service: General;  Laterality: N/A;   polyp removal at [place]  2017   PROCTOSCOPY  09/17/2016   Procedure: PROCTOSCOPY;  Surgeon: [name], MD;  Location: [facility]S;  Service: General;;   UPPER GI ENDOSCOPY      Family History  Problem Relation Age of Onset   Colon cancer Father        pt thinks he was in his 66's when dx   Esophageal cancer Neg Hx    Rectal cancer Neg Hx    Stomach cancer Neg Hx    Prostate cancer Neg Hx     Social History   Socioeconomic History   Marital status: Divorced    Spouse name: Not on file   Number of children: Not on file   Years of education: Not on file   Highest education level: Not on file  Occupational History   Not on file  Tobacco Use   Smoking  status: Current Every Day Smoker    Packs/day: 1.00    Years: 23.00    Pack years: 23.00    Types: Cigarettes   Smokeless tobacco: Never Used  Vaping Use   Vaping Use: Never used  Substance and Sexual Activity   Alcohol use: Yes    Alcohol/week: 12.0 standard drinks    Types: 12 Cans of beer per week    Comment: occ   Drug use: No   Sexual activity: Not on file  Other Topics Concern   Not on file  Social History Narrative   Not on file   Social Determinants of Health   Financial Resource Strain: Not on file  Food Insecurity: Not on file  Transportation  Needs: Not on file  Physical Activity: Not on file  Stress: Not on file  Social Connections: Not on file  Intimate Partner Violence: Not on file    Outpatient Medications Prior to Visit  Medication Sig Dispense Refill   atorvastatin (LIPITOR) 40 MG tablet Take 1 tablet (40 mg total) by mouth daily. 90 tablet 3   chlorthalidone (HYGROTON) 25 MG tablet Take 1 tablet (25 mg total) by mouth daily. 90 tablet 3   nicotine (NICODERM CQ - DOSED IN MG/24 HOURS) 21 mg/24hr patch Place 1 patch (21 mg total) onto the skin daily. 28 patch 0   folic acid (FOLVITE) 1 MG tablet Take 1 tablet (1 mg total) by mouth daily.     gabapentin (NEURONTIN) 300 MG capsule 1 at bedtime for1 week then 2  The next  week then 3 the next week then 4 daily. 120 capsule 0   gemfibrozil (LOPID) 600 MG tablet TAKE  (1)  TABLET TWICE A DAY WITH MEALS (BREAKFAST AND SUPPER) (Patient taking differently: Take 600 mg by mouth 2 (two) times daily before a meal. TAKE  (1)  TABLET TWICE A DAY WITH MEALS (BREAKFAST AND SUPPER)) 60 tablet 11   loratadine (CLARITIN) 10 MG tablet Take 1 tablet (10 mg total) by mouth daily. 30 tablet 0   Multiple Vitamin (MULTIVITAMIN WITH MINERALS) TABS tablet Take 1 tablet by mouth daily.     naproxen (NAPROSYN) 500 MG tablet Take 1 tablet (500 mg total) by mouth 2 (two) times daily. 60 tablet 1   Omega-3 Fatty Acids (FISH OIL) 1000 MG CPDR Take 1 capsule by mouth daily.      pantoprazole (PROTONIX) 40 MG tablet Take 1 tablet (40 mg total) by mouth daily at 6 (six) AM. 30 tablet 1   potassium chloride SA (KLOR-CON) 20 MEQ tablet TAKE  (1)  TABLET  THREE TIMES DAILY. 90 tablet 0   thiamine 100 MG tablet Take 1 tablet (100 mg total) by mouth daily.     fluticasone (FLONASE) 50 MCG/ACT nasal spray Place 2 sprays into both nostrils daily for 7 days. 9.9 mL 0   Ipratropium-Albuterol (COMBIVENT RESPIMAT) 20-100 MCG/ACT AERS respimat Inhale 1 puff into the lungs every 6 (six) hours as needed for  wheezing or shortness of breath. Use 3 times daily x 4 days then every 6 hours as needed. 4 g 1   No facility-administered medications prior to visit.    Allergies  Allergen Reactions   Adhesive [Tape] Rash and Other (See Comments)    Prefers paper tape    ROS Review of Systems  Respiratory: Positive for cough. Negative for choking, chest tightness, shortness of breath and wheezing.   Cardiovascular: Negative for chest pain, palpitations and leg swelling.  All other  systems reviewed and are negative.     Objective:    Physical Exam Vitals and nursing note reviewed.  Constitutional:      General: He is awake. He is not in acute distress.    Appearance: Normal appearance. He is well-groomed.     Interventions: Face mask in place.  HENT:     Head: Normocephalic.     Nose: No congestion.     Mouth/Throat:     Mouth: Mucous membranes are moist.     Pharynx: Oropharynx is clear. No oropharyngeal exudate.  Eyes:     Conjunctiva/sclera: Conjunctivae normal.  Cardiovascular:     Rate and Rhythm: Normal rate.     Pulses: Normal pulses.     Heart sounds: Normal heart sounds.  Pulmonary:     Effort: Pulmonary effort is normal.     Breath sounds: Normal breath sounds. No wheezing.     Comments: Cough Chest:     Chest wall: No tenderness.  Abdominal:     General: Bowel sounds are normal.  Skin:    General: Skin is warm.  Neurological:     Mental Status: He is alert and oriented to person, place, and time.  Psychiatric:        Mood and Affect: Mood normal.        Behavior: Behavior normal. Behavior is cooperative.     BP (!) 93/58    Pulse 71    Temp (!) 97.5 F (36.4 C)    Resp 20    Ht 5\' 10"  (1.778 m)    Wt 211 lb (95.7 kg)    SpO2 97%    BMI 30.28 kg/m  Wt Readings from Last 3 Encounters:  11/24/20 211 lb (95.7 kg)  11/07/20 216 lb (98 kg)  10/27/20 215 lb 9.6 oz (97.8 kg)     Health Maintenance Due  Topic Date Due   Hepatitis C Screening  Never done    COVID-19 Vaccine (1) Never done   COLONOSCOPY  09/30/2019    There are no preventive care reminders to display for this patient.  Lab Results  Component Value Date   TSH 1.500 05/28/2019   Lab Results  Component Value Date   WBC 7.6 11/11/2020   HGB 14.5 11/11/2020   HCT 43.4 11/11/2020   MCV 104.8 (H) 11/11/2020   PLT 178 11/11/2020   Lab Results  Component Value Date   NA 140 11/11/2020   K 3.7 11/11/2020   CO2 26 11/11/2020   GLUCOSE 111 (H) 11/11/2020   BUN 13 11/11/2020   CREATININE 0.96 11/11/2020   BILITOT 0.7 11/09/2020   ALKPHOS 40 11/09/2020   AST 29 11/09/2020   ALT 20 11/09/2020   PROT 6.3 (L) 11/09/2020   ALBUMIN 2.7 (L) 11/09/2020   CALCIUM 9.3 11/11/2020   ANIONGAP 10 11/11/2020   Lab Results  Component Value Date   CHOL 161 03/21/2020   Lab Results  Component Value Date   HDL 66 03/21/2020   Lab Results  Component Value Date   LDLCALC 48 03/21/2020   Lab Results  Component Value Date   TRIG 316 (H) 03/21/2020   Lab Results  Component Value Date   CHOLHDL 2.4 03/21/2020   Lab Results  Component Value Date   HGBA1C 4.8 09/06/2016      Assessment & Plan:   Problem List Items Addressed This Visit      Respiratory   CAP (community acquired pneumonia)    And admitted to the  hospital for pneumonia, hypoxia and respiratory failure in the last 2 weeks.  Patient is rapidly improving.  No signs and symptoms of infection or hypoxia.  Patient is afebrile in clinic today.  No wheezing, shortness of breath. Provided education to patient with printed handouts given. Follow-up in 4 weeks repeat chest x-ray to rule out pneumonia.  Rx refill sent to pharmacy.         Other   Hospital discharge follow-up [facility] discharge directions completed with medication reconciliation.  Patient verbalized understanding and will follow up as directed.      Relevant Orders   Basic Metabolic Panel   Magnesium   Comprehensive metabolic  panel      Meds ordered this encounter  Medications   potassium chloride SA (KLOR-CON) 20 MEQ tablet    Sig: TAKE  (1)  TABLET  THREE TIMES DAILY.    Dispense:  90 tablet    Refill:  2    Order Specific Question:   Supervising Provider    Answer:   Caryl Pina A [[id number]]    Follow-up: Return in about 4 weeks (around 12/22/2020).    Ivy Lynn, NP

## 2020-11-24 NOTE — Assessment & Plan Note (Signed)
Hospital discharge directions completed with medication reconciliation.  Patient verbalized understanding and will follow up as directed.

## 2020-11-24 NOTE — Patient Instructions (Signed)
Follow-up in 4 weeks, repeat chest x-ray to rule out pneumonia.  Community-Acquired Pneumonia, Adult Pneumonia is an infection of the lungs. It causes swelling in the airways of the lungs. Mucus and fluid may also build up inside the airways. One type of pneumonia can happen while a person is in a hospital. A different type can happen when a person is not in a hospital (community-acquired pneumonia).  What are the causes?  This condition is caused by germs (viruses, bacteria, or fungi). Some types of germs can be passed from one person to another. This can happen when you breathe in droplets from the cough or sneeze of an infected person. What increases the risk? You are more likely to develop this condition if you:  Have a long-term (chronic) disease, such as: ? Chronic obstructive pulmonary disease (COPD). ? Asthma. ? Cystic fibrosis. ? Congestive heart failure. ? Diabetes. ? Kidney disease.  Have HIV.  Have sickle cell disease.  Have had your spleen removed.  Do not take good care of your teeth and mouth (poor dental hygiene).  Have a medical condition that increases the risk of breathing in droplets from your own mouth and nose.  Have a weakened body defense system (immune system).  Are a smoker.  Travel to areas where the germs that cause this illness are common.  Are around certain animals or the places they live. What are the signs or symptoms?  A dry cough.  A wet (productive) cough.  Fever.  Sweating.  Chest pain. This often happens when breathing deeply or coughing.  Fast breathing or trouble breathing.  Shortness of breath.  Shaking chills.  Feeling tired (fatigue).  Muscle aches. How is this treated? Treatment for this condition depends on many things. Most adults can be treated at home. In some cases, treatment must happen in a hospital. Treatment may include:  Medicines given by mouth or through an IV tube.  Being given extra  oxygen.  Respiratory therapy. In rare cases, treatment for very bad pneumonia may include:  Using a machine to help you breathe.  Having a procedure to remove fluid from around your lungs. Follow these instructions at home: Medicines  Take over-the-counter and prescription medicines only as told by your doctor. ? Only take cough medicine if you are losing sleep.  If you were prescribed an antibiotic medicine, take it as told by your doctor. Do not stop taking the antibiotic even if you start to feel better. General instructions   Sleep with your head and neck raised (elevated). You can do this by sleeping in a recliner or by putting a few pillows under your head.  Rest as needed. Get at least 8 hours of sleep each night.  Drink enough water to keep your pee (urine) pale yellow.  Eat a healthy diet that includes plenty of vegetables, fruits, whole grains, low-fat dairy products, and lean protein.  Do not use any products that contain nicotine or tobacco. These include cigarettes, e-cigarettes, and chewing tobacco. If you need help quitting, ask your doctor.  Keep all follow-up visits as told by your doctor. This is important. How is this prevented? A shot (vaccine) can help prevent pneumonia. Shots are often suggested for:  People older than 48 years of age.  People older than 48 years of age who: ? Are having cancer treatment. ? Have long-term (chronic) lung disease. ? Have problems with their body's defense system. You may also prevent pneumonia if you take these actions:  Get  the flu (influenza) shot every year.  Go to the dentist as often as told.  Wash your hands often. If you cannot use soap and water, use hand sanitizer. Contact a doctor if:  You have a fever.  You lose sleep because your cough medicine does not help. Get help right away if:  You are short of breath and it gets worse.  You have more chest pain.  Your sickness gets worse. This is very  serious if: ? You are an older adult. ? Your body's defense system is weak.  You cough up blood. Summary  Pneumonia is an infection of the lungs.  Most adults can be treated at home. Some will need treatment in a hospital.  Drink enough water to keep your pee pale yellow.  Get at least 8 hours of sleep each night. This information is not intended to replace advice given to you by your health care provider. Make sure you discuss any questions you have with your health care provider. Document Revised: 03/21/2019 Document Reviewed: 07/27/2018 Elsevier Patient Education  [place].

## 2020-11-24 NOTE — Addendum Note (Signed)
Addended by: Ivy Lynn on: 11/24/2020 09:49 AM   Modules accepted: Level of Service

## 2020-11-25 ENCOUNTER — Other Ambulatory Visit: Payer: 59 | Admitting: Gastroenterology

## 2020-11-25 LAB — COMPREHENSIVE METABOLIC PANEL
ALT: 27 IU/L (ref 0–44)
AST: 32 IU/L (ref 0–40)
Albumin/Globulin Ratio: 1.1 — ABNORMAL LOW (ref 1.2–2.2)
Albumin: 3.3 g/dL — ABNORMAL LOW (ref 4.0–5.0)
Alkaline Phosphatase: 77 IU/L (ref 44–121)
BUN/Creatinine Ratio: 15 (ref 9–20)
BUN: 14 mg/dL (ref 6–24)
Bilirubin Total: 0.3 mg/dL (ref 0.0–1.2)
CO2: 24 mmol/L (ref 20–29)
Calcium: 8.7 mg/dL (ref 8.7–10.2)
Chloride: 102 mmol/L (ref 96–106)
Creatinine, Ser: 0.92 mg/dL (ref 0.76–1.27)
GFR calc Af Amer: 113 mL/min/{1.73_m2} (ref >59)
GFR calc non Af Amer: 98 mL/min/{1.73_m2} (ref >59)
Globulin, Total: 3.1 g/dL (ref 1.5–4.5)
Glucose: 142 mg/dL — ABNORMAL HIGH (ref 65–99)
Potassium: 3.6 mmol/L (ref 3.5–5.2)
Sodium: 141 mmol/L (ref 134–144)
Total Protein: 6.4 g/dL (ref 6.0–8.5)

## 2020-11-25 LAB — MAGNESIUM: Magnesium: 1.6 mg/dL (ref 1.6–2.3)

## 2020-12-03 ENCOUNTER — Other Ambulatory Visit: Payer: Self-pay | Admitting: Family Medicine

## 2020-12-10 ENCOUNTER — Other Ambulatory Visit: Payer: Self-pay | Admitting: Neurological Surgery

## 2020-12-22 ENCOUNTER — Ambulatory Visit (INDEPENDENT_AMBULATORY_CARE_PROVIDER_SITE_OTHER): Payer: 59 | Admitting: Nurse Practitioner

## 2020-12-22 ENCOUNTER — Telehealth: Payer: Self-pay | Admitting: *Deleted

## 2020-12-22 ENCOUNTER — Encounter: Payer: Self-pay | Admitting: Nurse Practitioner

## 2020-12-22 ENCOUNTER — Ambulatory Visit (INDEPENDENT_AMBULATORY_CARE_PROVIDER_SITE_OTHER): Payer: 59

## 2020-12-22 ENCOUNTER — Other Ambulatory Visit: Payer: Self-pay

## 2020-12-22 VITALS — BP 149/88 | HR 112 | Temp 98.2°F | Ht 70.0 in | Wt 210.4 lb

## 2020-12-22 DIAGNOSIS — J189 Pneumonia, unspecified organism: Secondary | ICD-10-CM

## 2020-12-22 NOTE — Telephone Encounter (Signed)
DR [name] AND Jenny Reichmann,  REVIEWING THIS CHART FOR A FLEX IN THE [facility] 2-7 Monday- LAST FLEX/ EGD IN [facility] 09-2019- FOUND THIS NOTE IN A 2017 SURGERY  Procedure Name: Intubation Date/Time: 09/17/2016 7:38 AM Performed by: Nilda Simmer Pre-anesthesia Checklist: Patient identified, Emergency Drugs available, Suction available, Patient being monitored and Timeout performed Patient Re-evaluated:Patient Re-evaluated prior to inductionOxygen Delivery Method: Circle system utilized Preoxygenation: Pre-oxygenation with 100% oxygen Intubation Type: IV induction Ventilation: Mask ventilation without difficulty and Oral airway inserted - appropriate to patient size Laryngoscope Size: Glidescope and 3 (first attempt with MAC 4, second attempt with glidescope #3) Grade View: Grade I Tube type: Oral Number of attempts: 3 Airway Equipment and Method: Stylet,  Oral airway and Video-laryngoscopy Placement Confirmation: ETT inserted through vocal cords under direct vision,  positive ETCO2 and breath sounds checked- equal and bilateral Secured at: 23 cm Tube secured with: Tape Dental Injury: Teeth and Oropharynx as per pre-operative assessment  Difficulty Due To: Difficulty was anticipated and Difficult Airway- due to reduced neck mobility Comments: First attempt at intubation by CRNA with MAC blade. Second attempt by MDA with MAC blade, grade III view. Third attempt with glidescope 3 with grade I view.   [facility] [place]

## 2020-12-22 NOTE — Assessment & Plan Note (Addendum)
Chest x-ray ordered results pending.  Normal chest x-ray. Heterogenous right basilar opacities may reflect residual infection, this means your chest x-ray shows old infection but no new infection and no pneumothorax or pleural effusion.  Recommendation-please follow-up with PCP with any further signs and symptoms of infection, fever, or disease progression

## 2020-12-22 NOTE — Telephone Encounter (Signed)
ALSO- HE IS SCHEDULED 1-18 2022 FOR BACK SURGERY  DR JACOBS, PLEASE ADVISE -OV? OR DIRECT

## 2020-12-22 NOTE — Patient Instructions (Signed)
Community-Acquired Pneumonia, Adult Pneumonia is an infection of the lungs. It causes irritation and swelling in the airways of the lungs. Mucus and fluid may also build up inside the airways. This may cause coughing and trouble breathing. One type of pneumonia can happen while you are in a hospital. A different type can happen when you are not in a hospital (community-acquired pneumonia). What are the causes? This condition is caused by germs (viruses, bacteria, or fungi). Some types of germs can spread from person to person. Pneumonia is not thought to spread from person to person.   What increases the risk? You are more likely to develop this condition if:  You have a long-term (chronic) disease, such as: ? Disease of the lungs. This may be chronic obstructive pulmonary disease (COPD) or asthma. ? Heart failure. ? Cystic fibrosis. ? Diabetes. ? Kidney disease. ? Sickle cell disease. ? HIV.  You have other health problems, such as: ? Your body's defense system (immune system) is weak. ? A condition that may cause you to breathe in fluids from your mouth and nose.  You had your spleen taken out.  You do not take good care of your teeth and mouth (poor dental hygiene).  You use or have used tobacco products.  You travel where the germs that cause this illness are common.  You are near certain animals or the places they live.  You are older than 49 years of age. What are the signs or symptoms? Symptoms of this condition include:  A cough.  A fever.  Sweating or chills.  Chest pain, often when you breathe deeply or cough.  Breathing problems, such as: ? Fast breathing. ? Trouble breathing. ? Shortness of breath.  Feeling tired (fatigued).  Muscle aches. How is this treated? Treatment for this condition depends on many things, such as:  The cause of your illness.  Your medicines.  Your other health problems. Most adults can be treated at home. Sometimes,  treatment must happen in a hospital.  Treatment may include medicines to kill germs.  Medicines may depend on which germ caused your illness. Very bad pneumonia is rare. If you get it, you may:  Have a machine to help you breathe.  Have fluid taken away from around your lungs. Follow these instructions at home: Medicines  Take over-the-counter and prescription medicines only as told by your doctor.  Take cough medicine only if you are losing sleep. Cough medicine can keep your body from taking mucus away from your lungs.  If you were prescribed an antibiotic medicine, take it as told by your doctor. Do not stop taking the antibiotic even if you start to feel better. Lifestyle  Do not drink alcohol.  Do not use any products that contain nicotine or tobacco, such as cigarettes, e-cigarettes, and chewing tobacco. If you need help quitting, ask your doctor.  Eat a healthy diet. This includes a lot of vegetables, fruits, whole grains, low-fat dairy products, and low-fat (lean) protein.      General instructions  Rest a lot. Sleep for at least 8 hours each night.  Sleep with your head and neck raised. Put a few pillows under your head or sleep in a reclining chair.  Return to your normal activities as told by your doctor. Ask your doctor what activities are safe for you.  Drink enough fluid to keep your pee (urine) pale yellow.  If your throat is sore, rinse your mouth often with salt water. To make salt   water, dissolve -1 tsp (3-6 g) of salt in 1 cup (237 mL) of warm water.  Keep all follow-up visits as told by your doctor. This is important.   How is this prevented? You can lower your risk of pneumonia by:  Getting the pneumonia shot (vaccine). These shots have different types and schedules. Ask your doctor what works best for you. Think about getting this shot if: ? You are older than 49 years of age. ? You are 19-65 years of age and:  You are being treated for  cancer.  You have long-term lung disease.  You have other problems that affect your body's defense system. Ask your doctor if you have one of these.  Getting your flu shot every year. Ask your doctor which type of shot is best for you.  Going to the dentist as often as told.  Washing your hands often with soap and water for at least 20 seconds. If you cannot use soap and water, use hand sanitizer. Contact a doctor if:  You have a fever.  You lose sleep because your cough medicine does not help. Get help right away if:  You are short of breath and this gets worse.  You have more chest pain.  Your sickness gets worse. This is very serious if: ? You are an older adult. ? Your body's defense system is weak.  You cough up blood. These symptoms may be an emergency. Do not wait to see if the symptoms will go away. Get medical help right away. Call your local emergency services (911 in the [place] Do not drive yourself to the hospital. Summary  Pneumonia is an infection of the lungs.  Community-acquired pneumonia affects people who have not been in the hospital. Certain germs can cause this infection.  This condition may be treated with medicines that kill germs.  For very bad pneumonia, you may need a hospital stay and treatment to help with breathing. This information is not intended to replace advice given to you by your health care provider. Make sure you discuss any questions you have with your health care provider. Document Revised: 09/11/2019 Document Reviewed: 09/11/2019 Elsevier Patient Education  2021 [employer]  

## 2020-12-22 NOTE — Telephone Encounter (Signed)
Lelan Pons,  This pt is a difficult intubation and his procedure will need to be performed at the hospital.  Thanks,  Osvaldo Angst

## 2020-12-22 NOTE — Progress Notes (Signed)
Established Patient Office Visit  Subjective:  Patient ID: Charles Santos, male    DOB: 1971/02/16  Age: 50 y.o. MRN: [id number]  CC:  Chief Complaint  Patient presents with  . Pneumonia    4 week follow up- re check xray     HPI Charles Santos presents for follow-up pneumonia. Patient was admitted in the hospital in November for bilateral pneumonia. Patient was seen after hospital discharge. Patient is in today to repeat chest x-ray to rule out pneumonia.  Past Medical History:  Diagnosis Date  . Anxiety   . Cancer of transverse colon ([place]) 09/17/2016   surgery done  . Colon cancer [facility]) 1998   surgery and chemo  . Complication of anesthesia    not fully under for polyp removal at [place] 2017  . Depression   . Emphysema of lung ([place])   . Family hx of colon cancer   . GERD (gastroesophageal reflux disease)    none recent  . Headache   . Hyperlipidemia   . Hypertension   . Vitamin D deficiency     Past Surgical History:  Procedure Laterality Date  . APPENDECTOMY    . colon resectomy  1998   2 degree herida  . COLONOSCOPY    . ESOPHAGOGASTRODUODENOSCOPY (EGD) WITH PROPOFOL N/A 05/19/2017   Procedure: ESOPHAGOGASTRODUODENOSCOPY (EGD) WITH PROPOFOL;  Surgeon: [name], MD;  Location: [facility] END[facility];  Service: Endoscopy;  Laterality: N/A;  . FLEXIBLE SIGMOIDOSCOPY N/A 05/19/2017   Procedure: FLEXIBLE SIGMOIDOSCOPY;  Surgeon: [name], MD;  Location: [facility] ENDOS[facility];  Service: Endoscopy;  Laterality: N/A;  . INGUINAL HERNIA REPAIR    . LAPAROSCOPIC SUBTOTAL COLECTOMY N/A 09/17/2016   Procedure: DIAGNOSTIC LAPAROSCOPY EXPLORATORY LAPAROTOMY SUBTOTAL COLECTOMY PROCTOSCOPY;  Surgeon: [name], MD;  Location: [facility] ORS;  Service: General;  Laterality: N/A;  . polyp removal at [place]  2017  . PROCTOSCOPY  09/17/2016   Procedure: PROCTOSCOPY;  Surgeon: [name], MD;  Location: [facility] OR[facility];  Service: General;;  . UPPER GI ENDOSCOPY      Family History   Problem Relation Age of Onset  . Colon cancer Father        pt thinks he was in his 59's when dx  . Esophageal cancer Neg Hx   . Rectal cancer Neg Hx   . Stomach cancer Neg Hx   . Prostate cancer Neg Hx     Social History   Socioeconomic History  . Marital status: Divorced    Spouse name: Not on file  . Number of children: Not on file  . Years of education: Not on file  . Highest education level: Not on file  Occupational History  . Not on file  Tobacco Use  . Smoking status: Current Every Day Smoker    Packs/day: 1.00    Years: 23.00    Pack years: 23.00    Types: Cigarettes  . Smokeless tobacco: Never Used  Vaping Use  . Vaping Use: Never used  Substance and Sexual Activity  . Alcohol use: Yes    Alcohol/week: 12.0 standard drinks    Types: 12 Cans of beer per week    Comment: occ  . Drug use: No  . Sexual activity: Not on file  Other Topics Concern  . Not on file  Social History Narrative  . Not on file   Social Determinants of Health   Financial Resource Strain: Not on file  Food Insecurity: Not on file  Transportation Needs: Not  on file  Physical Activity: Not on file  Stress: Not on file  Social Connections: Not on file  Intimate Partner Violence: Not on file    Outpatient Medications Prior to Visit  Medication Sig Dispense Refill  . atorvastatin (LIPITOR) 40 MG tablet Take 1 tablet (40 mg total) by mouth daily. 90 tablet 3  . chlorthalidone (HYGROTON) 25 MG tablet Take 1 tablet (25 mg total) by mouth daily. 90 tablet 3  . gabapentin (NEURONTIN) 300 MG capsule 1 at bedtime for1 week then 2  The next  week then 3 the next week then 4 daily. 120 capsule 0  . gemfibrozil (LOPID) 600 MG tablet TAKE  (1)  TABLET TWICE A DAY WITH MEALS (BREAKFAST AND SUPPER) (Patient taking differently: Take 600 mg by mouth 2 (two) times daily before a meal. TAKE  (1)  TABLET TWICE A DAY WITH MEALS (BREAKFAST AND SUPPER)) 60 tablet 11  . loratadine (CLARITIN) 10 MG tablet  Take 1 tablet (10 mg total) by mouth daily. 30 tablet 0  . Multiple Vitamin (MULTIVITAMIN WITH MINERALS) TABS tablet Take 1 tablet by mouth daily.    . naproxen (NAPROSYN) 500 MG tablet Take 1 tablet (500 mg total) by mouth 2 (two) times daily. 60 tablet 1  . nicotine (NICODERM CQ - DOSED IN MG/24 HOURS) 21 mg/24hr patch Place 1 patch (21 mg total) onto the skin daily. 28 patch 0  . Omega-3 Fatty Acids (FISH OIL) 1000 MG CPDR Take 1 capsule by mouth daily.     . pantoprazole (PROTONIX) 40 MG tablet Take 1 tablet (40 mg total) by mouth daily at 6 (six) AM. 30 tablet 1  . potassium chloride SA (KLOR-CON) 20 MEQ tablet TAKE  (1)  TABLET  THREE TIMES DAILY. 90 tablet 2  . thiamine 100 MG tablet Take 1 tablet (100 mg total) by mouth daily.    . folic acid (FOLVITE) 1 MG tablet Take 1 tablet (1 mg total) by mouth daily.     No facility-administered medications prior to visit.    Allergies  Allergen Reactions  . Adhesive [Tape] Rash and Other (See Comments)    Prefers paper tape    ROS Review of Systems  Constitutional: Negative for fever.  HENT: Negative.   Eyes: Negative.   Respiratory: Negative for shortness of breath and wheezing.   Gastrointestinal: Negative.        Tachycardia  Endocrine: Negative.   Genitourinary: Negative.   Musculoskeletal: Negative.   Skin: Negative.   Neurological: Negative.   Psychiatric/Behavioral: Negative.   All other systems reviewed and are negative.     Objective:    Physical Exam Vitals reviewed.  Constitutional:      Appearance: Normal appearance.  HENT:     Head: Normocephalic.     Nose: No congestion.  Eyes:     Conjunctiva/sclera: Conjunctivae normal.  Cardiovascular:     Rate and Rhythm: Tachycardia present.     Pulses: Normal pulses.     Heart sounds: Normal heart sounds.  Pulmonary:     Breath sounds: Normal breath sounds.  Abdominal:     General: Bowel sounds are normal.  Musculoskeletal:        General: Normal range of  motion.  Skin:    General: Skin is warm.  Neurological:     Mental Status: He is alert and oriented to person, place, and time.  Psychiatric:        Behavior: Behavior normal.     BP Marland Kitchen)  149/88   Pulse (!) 112   Temp 98.2 F (36.8 C) (Temporal)   Ht 5\' 10"  (1.778 m)   Wt 210 lb 6.4 oz (95.4 kg)   SpO2 94%   BMI 30.19 kg/m  Wt Readings from Last 3 Encounters:  12/22/20 210 lb 6.4 oz (95.4 kg)  11/24/20 211 lb (95.7 kg)  11/07/20 216 lb (98 kg)     Health Maintenance Due  Topic Date Due  . Hepatitis C Screening  Never done  . COVID-19 Vaccine (1) Never done  . COLONOSCOPY (Pts 45-27yrs Insurance coverage will need to be confirmed)  09/30/2019     Lab Results  Component Value Date   TSH 1.500 05/28/2019   Lab Results  Component Value Date   WBC 7.6 11/11/2020   HGB 14.5 11/11/2020   HCT 43.4 11/11/2020   MCV 104.8 (H) 11/11/2020   PLT 178 11/11/2020   Lab Results  Component Value Date   NA 141 11/24/2020   K 3.6 11/24/2020   CO2 24 11/24/2020   GLUCOSE 142 (H) 11/24/2020   BUN 14 11/24/2020   CREATININE 0.92 11/24/2020   BILITOT 0.3 11/24/2020   ALKPHOS 77 11/24/2020   AST 32 11/24/2020   ALT 27 11/24/2020   PROT 6.4 11/24/2020   ALBUMIN 3.3 (L) 11/24/2020   CALCIUM 8.7 11/24/2020   ANIONGAP 10 11/11/2020   Lab Results  Component Value Date   CHOL 161 03/21/2020   Lab Results  Component Value Date   HDL 66 03/21/2020   Lab Results  Component Value Date   LDLCALC 48 03/21/2020   Lab Results  Component Value Date   TRIG 316 (H) 03/21/2020   Lab Results  Component Value Date   CHOLHDL 2.4 03/21/2020   Lab Results  Component Value Date   HGBA1C 4.8 09/06/2016      Assessment & Plan:   Problem List Items Addressed This Visit      Respiratory   CAP (community acquired pneumonia) - Primary    Chest x-ray ordered results pending.  Normal chest x-ray. Heterogenous right basilar opacities may reflect residual infection, this means  your chest x-ray shows old infection but no new infection and no pneumothorax or pleural effusion.  Recommendation-please follow-up with PCP with any further signs and symptoms of infection, fever, or disease progression      Relevant Orders   DG Chest 2 View (Completed)      No orders of the defined types were placed in this encounter.   Follow-up: Return if symptoms worsen or fail to improve.    Ivy Lynn, NP

## 2020-12-23 ENCOUNTER — Telehealth: Payer: 59 | Admitting: Nurse Practitioner

## 2020-12-25 NOTE — Pre-Procedure Instructions (Signed)
Your procedure is scheduled on Tues., Jan. 25, 2022 from 11:30AM-4:06PM.  Report to [facility] Entrance "A" at 9:30AM  Call this number if you have problems the morning of surgery:  [phone]   Remember:  Do not eat or drink after midnight on Jan. 24th    Take these medicines the morning of surgery with A SIP OF WATER: Atorvastatin (LIPITOR) Gabapentin (NEURONTIN)  Loratadine (CLARITIN)  Nicotine (NICODERM CQ - DOSED IN MG/24 HOURS) Pantoprazole (PROTONIX)  7 days before surgery (12/30/20), stop taking all Aspirin (unless instructed by your doctor) and Other Aspirin containing products, Vitamins, Fish oils, and Herbal medications. Also stop all NSAIDS i.e. Advil, Ibuprofen, Motrin, Aleve, Anaprox, Naproxen, BC, Goody Powders, and all Supplements.   No Smoking of any kind, Tobacco/Vaping, or Alcohol products 24 hours prior to your procedure. If you use a Cpap at night, you may bring all equipment for your overnight stay.    Day of Surgery:  Do not wear jewelry.  Do not wear lotions, powders, colognes, or deodorant.  Do not shave 48 hours prior to surgery.  Men may shave face and neck.  Do not bring valuables to the hospital.  [facility] is not responsible for any belongings or valuables.  Contacts, dentures or bridgework may not be worn into surgery.   For patients admitted to the hospital, discharge time will be determined by your treatment team.  Patients discharged the day of surgery will not be allowed to drive home, and someone age 16 and over needs to stay with them for 24 hours.   Special instructions:  [place]- Preparing For Surgery  Before surgery, you can play an important role. Because skin is not sterile, your skin needs to be as free of germs as possible. You can reduce the number of germs on your skin by washing with CHG (chlorahexidine gluconate) Soap before surgery.  CHG is an antiseptic cleaner which kills germs and bonds with the skin to  continue killing germs even after washing.    Oral Hygiene is also important to reduce your risk of infection.  Remember - BRUSH YOUR TEETH THE MORNING OF SURGERY WITH YOUR REGULAR TOOTHPASTE  Please do not use if you have an allergy to CHG or antibacterial soaps. If your skin becomes reddened/irritated stop using the CHG.  Do not shave (including legs and underarms) for at least 48 hours prior to first CHG shower. It is OK to shave your face.  Please follow these instructions carefully.   1. Shower the NIGHT BEFORE SURGERY and the MORNING OF SURGERY with CHG.   2. If you chose to wash your hair, wash your hair first as usual with your normal shampoo.  3. After you shampoo, rinse your hair and body thoroughly to remove the shampoo.  4. Use CHG as you would any other liquid soap. You can apply CHG directly to the skin and wash gently with a scrungie or a clean washcloth.   5. Apply the CHG Soap to your body ONLY FROM THE NECK DOWN.  Do not use on open wounds or open sores. Avoid contact with your eyes, ears, mouth and genitals (private parts). Wash Face and genitals (private parts)  with your normal soap.  6. Wash thoroughly, paying special attention to the area where your surgery will be performed.  7. Thoroughly rinse your body with warm water from the neck down.  8. DO NOT shower/wash with your normal soap after using and rinsing off the CHG Soap.  9. Pat yourself dry with a CLEAN TOWEL.  10. Wear CLEAN PAJAMAS to bed the night before surgery, wear comfortable clothes the morning of surgery  11. Place CLEAN SHEETS on your bed the night of your first shower and DO NOT SLEEP WITH PETS.  Reminder: Do not apply any deodorants/lotions.  Please wear clean clothes to the hospital/surgery center.   Remember to brush your teeth WITH YOUR REGULAR TOOTHPASTE.  Please read over the following fact sheets that you were given.

## 2020-12-26 ENCOUNTER — Inpatient Hospital Stay (HOSPITAL_COMMUNITY)
Admission: RE | Admit: 2020-12-26 | Discharge: 2020-12-26 | Disposition: A | Discharge status: Home / Self Care | Payer: 59 | Source: Ambulatory Visit

## 2020-12-26 ENCOUNTER — Other Ambulatory Visit (HOSPITAL_COMMUNITY): Payer: 59

## 2020-12-26 NOTE — Telephone Encounter (Signed)
Patient cancelled his [facility] procedure.  Per Jenny Reichmann needs to be done at hospital. Please reschedule the patient. Thank you.

## 2020-12-30 NOTE — Telephone Encounter (Signed)
[name], Looks like he'll need to be done at [facility].  Can you put him on for flex sigmoidoscopy AND EGD for my next available Thursday at [facility] (for lynch syndrom, h/o cancers and duodenal polyps).  Thanks

## 2020-12-31 ENCOUNTER — Other Ambulatory Visit: Payer: Self-pay

## 2020-12-31 DIAGNOSIS — D132 Benign neoplasm of duodenum: Secondary | ICD-10-CM

## 2020-12-31 DIAGNOSIS — Z85038 Personal history of other malignant neoplasm of large intestine: Secondary | ICD-10-CM

## 2020-12-31 DIAGNOSIS — Z1509 Genetic susceptibility to other malignant neoplasm: Secondary | ICD-10-CM

## 2020-12-31 NOTE — Telephone Encounter (Signed)
The pt has been scheduled for 02/05/21 at 730 am at [facility] with Dr [name] COVID test on 2/21 at 1010 am.

## 2020-12-31 NOTE — Telephone Encounter (Signed)
FYI Dr [name] the pt has been cancelled and he will call back after he recovers from back surgery

## 2020-12-31 NOTE — Telephone Encounter (Signed)
Left message on machine to call back  

## 2020-12-31 NOTE — Telephone Encounter (Signed)
Spoke with the patient. He is having back surgery next week so he wants to cancel the procedure and he will call us back when he is cleared by surgeon  to have the procedure.

## 2021-01-02 ENCOUNTER — Inpatient Hospital Stay (HOSPITAL_COMMUNITY)
Admission: RE | Admit: 2021-01-02 | Discharge: 2021-01-02 | Disposition: A | Discharge status: Home / Self Care | Payer: 59 | Source: Ambulatory Visit

## 2021-01-02 ENCOUNTER — Other Ambulatory Visit (HOSPITAL_COMMUNITY): Payer: 59

## 2021-01-02 NOTE — Pre-Procedure Instructions (Signed)
Your procedure is scheduled on Wednesday, January 14, 2021 from 12:00PM-4:36PM.  Report to [facility] Entrance "A" at 10:00AM  Call this number if you have problems the morning of surgery:  [phone]   Remember:  Do not eat or drink after midnight on Feb. 1st    Take these medicines the morning of surgery with A SIP OF WATER: Atorvastatin (LIPITOR) Pantoprazole (PROTONIX) Gabapentin (NEURONTIN) - if needed  As of TODAY, stop taking all Aspirin (unless instructed by your doctor) and Other Aspirin containing products, Vitamins, Fish oils, and Herbal medications. Also stop all NSAIDS i.e. Advil, Ibuprofen, Motrin, Aleve, Anaprox, Naproxen, BC, Goody Powders, and all Supplements.   No Smoking of any kind, Tobacco/Vaping, or Alcohol products 24 hours prior to your procedure. If you use a Cpap at night, you may bring all equipment for your overnight stay.    Day of Surgery:  Do not wear jewelry.  Do not wear lotions, powders, colognes, or deodorant.  Do not shave 48 hours prior to surgery.  Men may shave face and neck.  Do not bring valuables to the hospital.  [facility] is not responsible for any belongings or valuables.  Contacts, dentures or bridgework may not be worn into surgery.   For patients admitted to the hospital, discharge time will be determined by your treatment team.  Patients discharged the day of surgery will not be allowed to drive home, and someone age 53 and over needs to stay with them for 24 hours.   Special instructions:  [place]- Preparing For Surgery  Before surgery, you can play an important role. Because skin is not sterile, your skin needs to be as free of germs as possible. You can reduce the number of germs on your skin by washing with CHG (chlorahexidine gluconate) Soap before surgery.  CHG is an antiseptic cleaner which kills germs and bonds with the skin to continue killing germs even after washing.    Oral Hygiene is also important  to reduce your risk of infection.  Remember - BRUSH YOUR TEETH THE MORNING OF SURGERY WITH YOUR REGULAR TOOTHPASTE  Please do not use if you have an allergy to CHG or antibacterial soaps. If your skin becomes reddened/irritated stop using the CHG.  Do not shave (including legs and underarms) for at least 48 hours prior to first CHG shower. It is OK to shave your face.  Please follow these instructions carefully.   1. Shower the NIGHT BEFORE SURGERY and the MORNING OF SURGERY with CHG.   2. If you chose to wash your hair, wash your hair first as usual with your normal shampoo.  3. After you shampoo, rinse your hair and body thoroughly to remove the shampoo.  4. Use CHG as you would any other liquid soap. You can apply CHG directly to the skin and wash gently with a scrungie or a clean washcloth.   5. Apply the CHG Soap to your body ONLY FROM THE NECK DOWN.  Do not use on open wounds or open sores. Avoid contact with your eyes, ears, mouth and genitals (private parts). Wash Face and genitals (private parts)  with your normal soap.  6. Wash thoroughly, paying special attention to the area where your surgery will be performed.  7. Thoroughly rinse your body with warm water from the neck down.  8. DO NOT shower/wash with your normal soap after using and rinsing off the CHG Soap.  9. Pat yourself dry with a CLEAN TOWEL.  10.  Wear CLEAN PAJAMAS to bed the night before surgery, wear comfortable clothes the morning of surgery  11. Place CLEAN SHEETS on your bed the night of your first shower and DO NOT SLEEP WITH PETS.  Reminder: Do not apply any deodorants/lotions.  Please wear clean clothes to the hospital/surgery center.   Remember to brush your teeth WITH YOUR REGULAR TOOTHPASTE.  Please read over the following fact sheets that you were given.

## 2021-01-12 ENCOUNTER — Other Ambulatory Visit (HOSPITAL_COMMUNITY): Payer: 59

## 2021-01-19 ENCOUNTER — Other Ambulatory Visit: Payer: 59 | Admitting: Gastroenterology

## 2021-01-24 ENCOUNTER — Encounter: Payer: Self-pay | Admitting: Gastroenterology

## 2021-01-24 ENCOUNTER — Other Ambulatory Visit: Payer: Self-pay | Admitting: Family Medicine

## 2021-01-26 ENCOUNTER — Other Ambulatory Visit: Payer: Self-pay | Admitting: *Deleted

## 2021-01-26 MED ORDER — PANTOPRAZOLE SODIUM 40 MG PO TBEC: 40.0000 mg | DELAYED_RELEASE_TABLET | Freq: Every day | ORAL | 0 refills | Status: DC

## 2021-01-29 ENCOUNTER — Encounter: Payer: Self-pay | Admitting: Family Medicine

## 2021-01-29 ENCOUNTER — Other Ambulatory Visit: Payer: Self-pay

## 2021-01-29 ENCOUNTER — Ambulatory Visit (INDEPENDENT_AMBULATORY_CARE_PROVIDER_SITE_OTHER): Payer: 59 | Admitting: Family Medicine

## 2021-01-29 VITALS — BP 132/89 | HR 75 | Temp 97.4°F | Resp 20 | Ht 70.0 in | Wt 210.2 lb

## 2021-01-29 DIAGNOSIS — E782 Mixed hyperlipidemia: Secondary | ICD-10-CM

## 2021-01-29 DIAGNOSIS — M5417 Radiculopathy, lumbosacral region: Secondary | ICD-10-CM

## 2021-01-29 DIAGNOSIS — I10 Essential (primary) hypertension: Secondary | ICD-10-CM | POA: Diagnosis not present

## 2021-01-29 DIAGNOSIS — M4306 Spondylolysis, lumbar region: Secondary | ICD-10-CM

## 2021-01-29 LAB — CBC WITH DIFFERENTIAL/PLATELET
Basophils Absolute: 0 10*3/uL (ref 0.0–0.2)
Basos: 0 %
EOS (ABSOLUTE): 0.1 10*3/uL (ref 0.0–0.4)
Eos: 1 %
Hematocrit: 51 % (ref 37.5–51.0)
Hemoglobin: 17.8 g/dL — ABNORMAL HIGH (ref 13.0–17.7)
Immature Grans (Abs): 0 10*3/uL (ref 0.0–0.1)
Immature Granulocytes: 0 %
Lymphocytes Absolute: 2.1 10*3/uL (ref 0.7–3.1)
Lymphs: 23 %
MCH: 34.3 pg — ABNORMAL HIGH (ref 26.6–33.0)
MCHC: 34.9 g/dL (ref 31.5–35.7)
MCV: 98 fL — ABNORMAL HIGH (ref 79–97)
Monocytes Absolute: 1 10*3/uL — ABNORMAL HIGH (ref 0.1–0.9)
Monocytes: 11 %
Neutrophils Absolute: 5.8 10*3/uL (ref 1.4–7.0)
Neutrophils: 65 %
Platelets: 217 10*3/uL (ref 150–450)
RBC: 5.19 x10E6/uL (ref 4.14–5.80)
RDW: 13.8 % (ref 11.6–15.4)
WBC: 9.1 10*3/uL (ref 3.4–10.8)

## 2021-01-29 LAB — CMP14+EGFR
ALT: 41 IU/L (ref 0–44)
AST: 44 IU/L — ABNORMAL HIGH (ref 0–40)
Albumin/Globulin Ratio: 1.6 (ref 1.2–2.2)
Albumin: 4.1 g/dL (ref 4.0–5.0)
Alkaline Phosphatase: 82 IU/L (ref 44–121)
BUN/Creatinine Ratio: 22 — ABNORMAL HIGH (ref 9–20)
BUN: 23 mg/dL (ref 6–24)
Bilirubin Total: 1.1 mg/dL (ref 0.0–1.2)
CO2: 26 mmol/L (ref 20–29)
Calcium: 9.4 mg/dL (ref 8.7–10.2)
Chloride: 95 mmol/L — ABNORMAL LOW (ref 96–106)
Creatinine, Ser: 1.06 mg/dL (ref 0.76–1.27)
GFR calc Af Amer: 95 mL/min/{1.73_m2} (ref >59)
GFR calc non Af Amer: 83 mL/min/{1.73_m2} (ref >59)
Globulin, Total: 2.6 g/dL (ref 1.5–4.5)
Glucose: 84 mg/dL (ref 65–99)
Potassium: 3.8 mmol/L (ref 3.5–5.2)
Sodium: 137 mmol/L (ref 134–144)
Total Protein: 6.7 g/dL (ref 6.0–8.5)

## 2021-01-29 LAB — LIPID PANEL
Chol/HDL Ratio: 1.9 ratio (ref 0.0–5.0)
Cholesterol, Total: 166 mg/dL (ref 100–199)
HDL: 86 mg/dL (ref >39)
LDL Chol Calc (NIH): 37 mg/dL (ref 0–99)
Triglycerides: 295 mg/dL — ABNORMAL HIGH (ref 0–149)
VLDL Cholesterol Cal: 43 mg/dL — ABNORMAL HIGH (ref 5–40)

## 2021-01-29 NOTE — Pre-Procedure Instructions (Signed)
Surgical Instructions    Your procedure is scheduled on Monday, February 21st.  Report to [facility] Main Entrance "A" at 10:30 A.M., then check in with the Admitting office.  Call this number if you have problems the morning of surgery:  [phone]   If you have any questions prior to your surgery date call [phone]: Open Monday-Friday 8am-4pm    Remember:  Do not eat or drink after midnight the night before your surgery    Take these medicines the morning of surgery with A SIP OF WATER  atorvastatin (LIPITOR) loratadine (CLARITIN) pantoprazole (PROTONIX) gabapentin (NEURONTIN)-use as needed  As of today, STOP taking any Aspirin (unless otherwise instructed by your surgeon) Aleve, Naproxen, Ibuprofen, Motrin, Advil, Goody's, BC's, all herbal medications, fish oil, and all vitamins.                     Do not wear jewelry.            Do not wear lotions, powders, colognes, or deodorant.            Men may shave face and neck.            Do not bring valuables to the hospital.            [facility] is not responsible for any belongings or valuables.  Do NOT Smoke (Tobacco/Vaping) or drink Alcohol 24 hours prior to your procedure If you use a CPAP at night, you may bring all equipment for your overnight stay.   Contacts, glasses, dentures or bridgework may not be worn into surgery, please bring cases for these belongings   For patients admitted to the hospital, discharge time will be determined by your treatment team.   Patients discharged the day of surgery will not be allowed to drive home, and someone needs to stay with them for 24 hours.    Special instructions:   [place]- Preparing For Surgery  Before surgery, you can play an important role. Because skin is not sterile, your skin needs to be as free of germs as possible. You can reduce the number of germs on your skin by washing with CHG (chlorahexidine gluconate) Soap before surgery.  CHG is an antiseptic  cleaner which kills germs and bonds with the skin to continue killing germs even after washing.    Oral Hygiene is also important to reduce your risk of infection.  Remember - BRUSH YOUR TEETH THE MORNING OF SURGERY WITH YOUR REGULAR TOOTHPASTE  Please do not use if you have an allergy to CHG or antibacterial soaps. If your skin becomes reddened/irritated stop using the CHG.  Do not shave (including legs and underarms) for at least 48 hours prior to first CHG shower. It is OK to shave your face.  Please follow these instructions carefully.   1. Shower the NIGHT BEFORE SURGERY and the MORNING OF SURGERY  2. If you chose to wash your hair, wash your hair first as usual with your normal shampoo.  3. After you shampoo, rinse your hair and body thoroughly to remove the shampoo.  4. Wash Face and genitals (private parts) with your normal soap.   5.  Shower the NIGHT BEFORE SURGERY and the MORNING OF SURGERY with CHG Soap.   6. Use CHG Soap as you would any other liquid soap. You can apply CHG directly to the skin and wash gently with a scrungie or a clean washcloth.   7. Apply the CHG Soap to  your body ONLY FROM THE NECK DOWN.  Do not use on open wounds or open sores. Avoid contact with your eyes, ears, mouth and genitals (private parts). Wash Face and genitals (private parts)  with your normal soap.   8. Wash thoroughly, paying special attention to the area where your surgery will be performed.  9. Thoroughly rinse your body with warm water from the neck down.  10. DO NOT shower/wash with your normal soap after using and rinsing off the CHG Soap.  11. Pat yourself dry with a CLEAN TOWEL.  12. Wear CLEAN PAJAMAS to bed the night before surgery  13. Place CLEAN SHEETS on your bed the night before your surgery  14. DO NOT SLEEP WITH PETS.   Day of Surgery: Wear Clean/Comfortable clothing the morning of surgery Do not apply any deodorants/lotions.   Remember to brush your teeth WITH  YOUR REGULAR TOOTHPASTE.   Please read over the following fact sheets that you were given.

## 2021-01-29 NOTE — Progress Notes (Signed)
Subjective:  Patient ID: Charles Santos, male    DOB: 08/19/1971  Age: 50 y.o. MRN: [id number]  CC: Medical Management of Chronic Issues   HPI Charles Santos presents for lumbar rad. [place] DCed didn't help after 2 weeks so it was DCed.   Lumbar radiculopathy going down both legs, R>L Surgery coming up on Feb. 21. Dr. At [facility].  Postponed colonoscopy.   Depression screen [facility] 2/9 01/29/2021 11/24/2020 07/07/2020  Decreased Interest 0 0 0  Down, Depressed, Hopeless 0 0 0  PHQ - 2 Score 0 0 0  Altered sleeping - - -  Tired, decreased energy - - -  Change in appetite - - -  Feeling bad or failure about yourself  - - -  Trouble concentrating - - -  Moving slowly or fidgety/restless - - -  Suicidal thoughts - - -  PHQ-9 Score - - -  Difficult doing work/chores - - -  Some recent data might be hidden    History Charles Santos has a past medical history of Acute midline low back pain with bilateral sciatica (05/16/2020), Anxiety, Cancer of transverse colon ([place]) (09/17/2016), Colon cancer ([place]) (1093), Complication of anesthesia, Depression, Emphysema of lung ([place]), Family hx of colon cancer, GERD (gastroesophageal reflux disease), Headache, Hyperlipidemia, Hypertension, and Vitamin D deficiency.   He has a past surgical history that includes colon resectomy (1998); Appendectomy; Laparoscopic subtotal colectomy (N/A, 09/17/2016); Proctoscopy (09/17/2016); Inguinal hernia repair; Upper gi endoscopy; polyp removal at [place] (2017); Flexible sigmoidoscopy (N/A, 05/19/2017); Esophagogastroduodenoscopy (egd) with propofol (N/A, 05/19/2017); and Colonoscopy.   His family history includes Colon cancer in his father.He reports that he quit smoking about 3 months ago. His smoking use included cigarettes. He has a 23.00 pack-year smoking history. He has never used smokeless tobacco. He reports current alcohol use of about 12.0 standard drinks of alcohol per week. He reports that he does not use  drugs.    ROS Review of Systems  Constitutional: Negative for fever.  Respiratory: Negative for shortness of breath.   Cardiovascular: Negative for chest pain.  Musculoskeletal: Negative for arthralgias.  Skin: Negative for rash.    Objective:  BP 132/89   Pulse 75   Temp (!) 97.4 F (36.3 C) (Temporal)   Resp 20   Ht 5\' 10"  (1.778 m)   Wt 210 lb 4 oz (95.4 kg)   SpO2 96%   BMI 30.17 kg/m   BP Readings from Last 3 Encounters:  01/29/21 132/89  12/22/20 (!) 149/88  11/24/20 (!) 93/58    Wt Readings from Last 3 Encounters:  01/29/21 210 lb 4 oz (95.4 kg)  12/22/20 210 lb 6.4 oz (95.4 kg)  11/24/20 211 lb (95.7 kg)     Physical Exam Vitals reviewed.  Constitutional:      General: He is in acute distress.     Appearance: He is well-developed and well-nourished.  HENT:     Head: Normocephalic.  Eyes:     Pupils: Pupils are equal, round, and reactive to light.  Cardiovascular:     Rate and Rhythm: Normal rate and regular rhythm.     Heart sounds: Normal heart sounds. No murmur heard.   Pulmonary:     Effort: Pulmonary effort is normal.     Breath sounds: Normal breath sounds.  Abdominal:     Tenderness: There is no abdominal tenderness.  Musculoskeletal:        General: Tenderness present.     Cervical back: Normal range of motion.  Skin:    General: Skin is warm and dry.  Neurological:     Mental Status: He is alert and oriented to person, place, and time.     Deep Tendon Reflexes: Reflexes are normal and symmetric.  Psychiatric:        Behavior: Behavior normal.        Thought Content: Thought content normal.       Assessment & Plan:   There are no diagnoses linked to this encounter.     I am having Jeanmarie Plant maintain his atorvastatin, chlorthalidone, gemfibrozil, gabapentin, nicotine, multivitamin with minerals, thiamine, loratadine, potassium chloride SA, QUEtiapine, Fish Oil, naproxen, and pantoprazole.  Allergies as of 01/29/2021       Reactions   Adhesive [tape] Rash, Other (See Comments)   Prefers paper tape      Medication List       Accurate as of January 29, 2021  8:54 AM. If you have any questions, ask your nurse or doctor.        atorvastatin 40 MG tablet Commonly known as: LIPITOR Take 1 tablet (40 mg total) by mouth daily.   chlorthalidone 25 MG tablet Commonly known as: HYGROTON Take 1 tablet (25 mg total) by mouth daily.   Fish Oil 1200 MG Caps Take 1,200 mg by mouth daily.   gabapentin 300 MG capsule Commonly known as: NEURONTIN 1 at bedtime for1 week then 2  The next  week then 3 the next week then 4 daily. What changed:   how much to take  how to take this  when to take this  reasons to take this  additional instructions   gemfibrozil 600 MG tablet Commonly known as: LOPID TAKE  (1)  TABLET TWICE A DAY WITH MEALS (BREAKFAST AND SUPPER) What changed:   how much to take  how to take this  when to take this   loratadine 10 MG tablet Commonly known as: CLARITIN Take 1 tablet (10 mg total) by mouth daily.   multivitamin with minerals Tabs tablet Take 1 tablet by mouth daily.   naproxen 500 MG tablet Commonly known as: NAPROSYN TAKE  (1)  TABLET TWICE A DAY.   nicotine 21 mg/24hr patch Commonly known as: NICODERM CQ - dosed in mg/24 hours Place 1 patch (21 mg total) onto the skin daily.   pantoprazole 40 MG tablet Commonly known as: PROTONIX Take 1 tablet (40 mg total) by mouth daily.   potassium chloride SA 20 MEQ tablet Commonly known as: KLOR-CON TAKE  (1)  TABLET  THREE TIMES DAILY. What changed:   how much to take  how to take this  when to take this  additional instructions   QUEtiapine 100 MG tablet Commonly known as: SEROQUEL Take 100 mg by mouth at bedtime.   thiamine 100 MG tablet Take 1 tablet (100 mg total) by mouth daily.        Follow-up: No follow-ups on file.  Claretta Fraise, M.D.

## 2021-01-30 ENCOUNTER — Telehealth: Payer: Self-pay

## 2021-01-30 ENCOUNTER — Encounter (HOSPITAL_COMMUNITY): Payer: Self-pay

## 2021-01-30 ENCOUNTER — Other Ambulatory Visit (HOSPITAL_COMMUNITY)
Admission: RE | Admit: 2021-01-30 | Discharge: 2021-01-30 | Disposition: A | Discharge status: Home / Self Care | Payer: 59 | Source: Ambulatory Visit | Attending: Neurological Surgery | Admitting: Neurological Surgery

## 2021-01-30 ENCOUNTER — Encounter (HOSPITAL_COMMUNITY)
Admission: RE | Admit: 2021-01-30 | Discharge: 2021-01-30 | Disposition: A | Discharge status: Home / Self Care | Payer: 59 | Source: Ambulatory Visit | Attending: Neurological Surgery | Admitting: Neurological Surgery

## 2021-01-30 DIAGNOSIS — Z20822 Contact with and (suspected) exposure to covid-19: Secondary | ICD-10-CM | POA: Insufficient documentation

## 2021-01-30 DIAGNOSIS — Z01812 Encounter for preprocedural laboratory examination: Secondary | ICD-10-CM | POA: Insufficient documentation

## 2021-01-30 LAB — SARS CORONAVIRUS 2 (TAT 6-24 HRS): SARS Coronavirus 2: NEGATIVE

## 2021-01-30 LAB — SURGICAL PCR SCREEN
MRSA, PCR: NEGATIVE
Staphylococcus aureus: NEGATIVE

## 2021-01-30 NOTE — Telephone Encounter (Signed)
I did a result note for this.  Please check on that so we do not duplicate nursing effort.

## 2021-01-30 NOTE — Telephone Encounter (Signed)
Please review labs and advise.

## 2021-01-30 NOTE — Progress Notes (Signed)
PCP - Dr. Claretta Fraise Cardiologist - denies  Chest x-ray - 12/22/20 EKG - 11/07/20 Stress Test - denies ECHO - denies Cardiac Cath - denies  Sleep Study - denies CPAP - denies  Blood Thinner Instructions: n/a Aspirin Instructions: n/a  COVID TEST- 01/30/21, pt aware to quarantine after testing.    Anesthesia review: no.   Patient denies shortness of breath, fever, cough and chest pain at PAT appointment   All instructions explained to the patient, with a verbal understanding of the material. Patient agrees to go over the instructions while at home for a better understanding. Patient also instructed to self quarantine after being tested for COVID-19. The opportunity to ask questions was provided.

## 2021-02-01 ENCOUNTER — Encounter: Payer: Self-pay | Admitting: Family Medicine

## 2021-02-02 ENCOUNTER — Other Ambulatory Visit: Payer: Self-pay

## 2021-02-02 ENCOUNTER — Other Ambulatory Visit (HOSPITAL_COMMUNITY): Payer: 59

## 2021-02-02 ENCOUNTER — Inpatient Hospital Stay (HOSPITAL_COMMUNITY): Payer: 59 | Admitting: Physician Assistant

## 2021-02-02 ENCOUNTER — Inpatient Hospital Stay (HOSPITAL_COMMUNITY)
Admission: RE | Admit: 2021-02-02 | Discharge: 2021-02-03 | DRG: 455 | Disposition: A | Discharge status: Home / Self Care | Payer: 59 | Attending: Neurological Surgery | Admitting: Neurological Surgery

## 2021-02-02 ENCOUNTER — Inpatient Hospital Stay (HOSPITAL_COMMUNITY): Payer: 59 | Admitting: Certified Registered"

## 2021-02-02 ENCOUNTER — Inpatient Hospital Stay (HOSPITAL_COMMUNITY): Payer: 59

## 2021-02-02 ENCOUNTER — Encounter (HOSPITAL_COMMUNITY)
Admission: RE | Disposition: A | Discharge status: Home / Self Care | Payer: Self-pay | Source: Home / Self Care | Attending: Neurological Surgery

## 2021-02-02 ENCOUNTER — Encounter (HOSPITAL_COMMUNITY): Payer: Self-pay | Admitting: Neurological Surgery

## 2021-02-02 DIAGNOSIS — I10 Essential (primary) hypertension: Secondary | ICD-10-CM | POA: Diagnosis present

## 2021-02-02 DIAGNOSIS — M48062 Spinal stenosis, lumbar region with neurogenic claudication: Principal | ICD-10-CM | POA: Diagnosis present

## 2021-02-02 DIAGNOSIS — K219 Gastro-esophageal reflux disease without esophagitis: Secondary | ICD-10-CM | POA: Diagnosis present

## 2021-02-02 DIAGNOSIS — Z800 Family history of malignant neoplasm of digestive organs: Secondary | ICD-10-CM

## 2021-02-02 DIAGNOSIS — Z87891 Personal history of nicotine dependence: Secondary | ICD-10-CM

## 2021-02-02 DIAGNOSIS — Z20822 Contact with and (suspected) exposure to covid-19: Secondary | ICD-10-CM | POA: Diagnosis present

## 2021-02-02 DIAGNOSIS — J439 Emphysema, unspecified: Secondary | ICD-10-CM | POA: Diagnosis present

## 2021-02-02 DIAGNOSIS — M5416 Radiculopathy, lumbar region: Secondary | ICD-10-CM | POA: Diagnosis present

## 2021-02-02 DIAGNOSIS — Z419 Encounter for procedure for purposes other than remedying health state, unspecified: Secondary | ICD-10-CM

## 2021-02-02 DIAGNOSIS — Z85038 Personal history of other malignant neoplasm of large intestine: Secondary | ICD-10-CM | POA: Diagnosis not present

## 2021-02-02 DIAGNOSIS — E785 Hyperlipidemia, unspecified: Secondary | ICD-10-CM | POA: Diagnosis present

## 2021-02-02 HISTORY — PX: TRANSFORAMINAL LUMBAR INTERBODY FUSION W/ MIS 1 LEVEL: SHX6145

## 2021-02-02 LAB — TYPE AND SCREEN
ABO/RH(D): A POS
Antibody Screen: NEGATIVE

## 2021-02-02 SURGERY — MINIMALLY INVASIVE (MIS) TRANSFORAMINAL LUMBAR INTERBODY FUSION (TLIF) 1 LEVEL
Anesthesia: General | Site: Spine Lumbar | Laterality: Right

## 2021-02-02 MED ORDER — PHENYLEPHRINE HCL-NACL 10-0.9 MG/250ML-% IV SOLN
INTRAVENOUS | Status: DC | PRN
  Administered 2021-02-02: 20 ug/min via INTRAVENOUS

## 2021-02-02 MED ORDER — FENTANYL CITRATE (PF) 250 MCG/5ML IJ SOLN
INTRAMUSCULAR | Status: AC
  Filled 2021-02-02: qty 5

## 2021-02-02 MED ORDER — MENTHOL 3 MG MT LOZG: 1.0000 | LOZENGE | OROMUCOSAL | Status: DC | PRN

## 2021-02-02 MED ORDER — GABAPENTIN 300 MG PO CAPS
300.0000 mg | ORAL_CAPSULE | Freq: Once | ORAL | Status: AC
  Administered 2021-02-02: 300 mg via ORAL
  Filled 2021-02-02: qty 1

## 2021-02-02 MED ORDER — ACETAMINOPHEN 325 MG PO TABS: 650.0000 mg | ORAL_TABLET | ORAL | Status: DC | PRN

## 2021-02-02 MED ORDER — CEFAZOLIN SODIUM-DEXTROSE 2-4 GM/100ML-% IV SOLN
2.0000 g | INTRAVENOUS | Status: AC
  Administered 2021-02-02 (×2): 2 g via INTRAVENOUS
  Filled 2021-02-02: qty 100

## 2021-02-02 MED ORDER — LIDOCAINE 2% (20 MG/ML) 5 ML SYRINGE
INTRAMUSCULAR | Status: DC | PRN
  Administered 2021-02-02: 100 mg via INTRAVENOUS

## 2021-02-02 MED ORDER — HYDROMORPHONE HCL 1 MG/ML IJ SOLN
0.2500 mg | INTRAMUSCULAR | Status: DC | PRN
  Administered 2021-02-02 (×4): 0.5 mg via INTRAVENOUS

## 2021-02-02 MED ORDER — DOCUSATE SODIUM 100 MG PO CAPS
100.0000 mg | ORAL_CAPSULE | Freq: Two times a day (BID) | ORAL | Status: DC
  Administered 2021-02-03: 100 mg via ORAL
  Filled 2021-02-02 (×2): qty 1

## 2021-02-02 MED ORDER — CHLORHEXIDINE GLUCONATE CLOTH 2 % EX PADS: 6.0000 | MEDICATED_PAD | Freq: Once | CUTANEOUS | Status: DC

## 2021-02-02 MED ORDER — KETAMINE HCL 50 MG/5ML IJ SOSY
PREFILLED_SYRINGE | INTRAMUSCULAR | Status: AC
  Filled 2021-02-02: qty 5

## 2021-02-02 MED ORDER — CHLORTHALIDONE 25 MG PO TABS
25.0000 mg | ORAL_TABLET | Freq: Every day | ORAL | Status: DC
  Administered 2021-02-03: 25 mg via ORAL
  Filled 2021-02-02: qty 1

## 2021-02-02 MED ORDER — GABAPENTIN 300 MG PO CAPS: 300.0000 mg | ORAL_CAPSULE | Freq: Three times a day (TID) | ORAL | Status: DC | PRN

## 2021-02-02 MED ORDER — ADULT MULTIVITAMIN W/MINERALS CH
1.0000 | ORAL_TABLET | Freq: Every day | ORAL | Status: DC
  Administered 2021-02-02 – 2021-02-03 (×2): 1 via ORAL
  Filled 2021-02-02 (×2): qty 1

## 2021-02-02 MED ORDER — PANTOPRAZOLE SODIUM 40 MG PO TBEC
40.0000 mg | DELAYED_RELEASE_TABLET | Freq: Every day | ORAL | Status: DC
  Administered 2021-02-02 – 2021-02-03 (×2): 40 mg via ORAL
  Filled 2021-02-02 (×2): qty 1

## 2021-02-02 MED ORDER — ROCURONIUM BROMIDE 10 MG/ML (PF) SYRINGE
PREFILLED_SYRINGE | INTRAVENOUS | Status: DC | PRN
  Administered 2021-02-02: 70 mg via INTRAVENOUS
  Administered 2021-02-02: 30 mg via INTRAVENOUS

## 2021-02-02 MED ORDER — ONDANSETRON HCL 4 MG/2ML IJ SOLN: 4.0000 mg | Freq: Four times a day (QID) | INTRAMUSCULAR | Status: DC | PRN

## 2021-02-02 MED ORDER — FENTANYL CITRATE (PF) 100 MCG/2ML IJ SOLN
INTRAMUSCULAR | Status: AC
  Filled 2021-02-02: qty 2

## 2021-02-02 MED ORDER — POTASSIUM CHLORIDE CRYS ER 20 MEQ PO TBCR: 20.0000 meq | EXTENDED_RELEASE_TABLET | ORAL | Status: DC

## 2021-02-02 MED ORDER — QUETIAPINE FUMARATE 100 MG PO TABS
100.0000 mg | ORAL_TABLET | Freq: Every day | ORAL | Status: DC
  Administered 2021-02-02: 100 mg via ORAL
  Filled 2021-02-02 (×2): qty 1

## 2021-02-02 MED ORDER — CEFAZOLIN SODIUM 1 G IJ SOLR
INTRAMUSCULAR | Status: AC
  Filled 2021-02-02: qty 20

## 2021-02-02 MED ORDER — SODIUM CHLORIDE 0.9% FLUSH
3.0000 mL | Freq: Two times a day (BID) | INTRAVENOUS | Status: DC
  Administered 2021-02-02: 3 mL via INTRAVENOUS

## 2021-02-02 MED ORDER — LIDOCAINE-EPINEPHRINE 1 %-1:100000 IJ SOLN
INTRAMUSCULAR | Status: DC | PRN
  Administered 2021-02-02: 10 mL

## 2021-02-02 MED ORDER — PHENOL 1.4 % MT LIQD: 1.0000 | OROMUCOSAL | Status: DC | PRN

## 2021-02-02 MED ORDER — PROMETHAZINE HCL 25 MG/ML IJ SOLN: 6.2500 mg | INTRAMUSCULAR | Status: DC | PRN

## 2021-02-02 MED ORDER — POTASSIUM CHLORIDE CRYS ER 20 MEQ PO TBCR
20.0000 meq | EXTENDED_RELEASE_TABLET | Freq: Every evening | ORAL | Status: DC
  Administered 2021-02-02: 20 meq via ORAL
  Filled 2021-02-02: qty 1

## 2021-02-02 MED ORDER — DEXAMETHASONE SODIUM PHOSPHATE 10 MG/ML IJ SOLN
INTRAMUSCULAR | Status: DC | PRN
  Administered 2021-02-02: 10 mg via INTRAVENOUS

## 2021-02-02 MED ORDER — HYDROMORPHONE HCL 1 MG/ML IJ SOLN
INTRAMUSCULAR | Status: AC
  Filled 2021-02-02: qty 1

## 2021-02-02 MED ORDER — SODIUM CHLORIDE 0.9% FLUSH: 3.0000 mL | INTRAVENOUS | Status: DC | PRN

## 2021-02-02 MED ORDER — ACETAMINOPHEN 10 MG/ML IV SOLN
INTRAVENOUS | Status: AC
  Filled 2021-02-02: qty 100

## 2021-02-02 MED ORDER — PROPOFOL 10 MG/ML IV BOLUS
INTRAVENOUS | Status: DC | PRN
  Administered 2021-02-02: 50 mg via INTRAVENOUS
  Administered 2021-02-02: 150 mg via INTRAVENOUS

## 2021-02-02 MED ORDER — ONDANSETRON HCL 4 MG PO TABS: 4.0000 mg | ORAL_TABLET | Freq: Four times a day (QID) | ORAL | Status: DC | PRN

## 2021-02-02 MED ORDER — ONDANSETRON HCL 4 MG/2ML IJ SOLN
INTRAMUSCULAR | Status: DC | PRN
  Administered 2021-02-02: 4 mg via INTRAVENOUS

## 2021-02-02 MED ORDER — KETAMINE HCL 10 MG/ML IJ SOLN
INTRAMUSCULAR | Status: DC | PRN
  Administered 2021-02-02 (×2): 20 mg via INTRAVENOUS
  Administered 2021-02-02: 10 mg via INTRAVENOUS

## 2021-02-02 MED ORDER — MIDAZOLAM HCL 2 MG/2ML IJ SOLN
INTRAMUSCULAR | Status: AC
  Filled 2021-02-02: qty 2

## 2021-02-02 MED ORDER — CYCLOBENZAPRINE HCL 10 MG PO TABS
10.0000 mg | ORAL_TABLET | Freq: Three times a day (TID) | ORAL | Status: DC | PRN
  Administered 2021-02-02 – 2021-02-03 (×3): 10 mg via ORAL
  Filled 2021-02-02 (×3): qty 1

## 2021-02-02 MED ORDER — LACTATED RINGERS IV SOLN: INTRAVENOUS | Status: DC | PRN

## 2021-02-02 MED ORDER — ACETAMINOPHEN 500 MG PO TABS
1000.0000 mg | ORAL_TABLET | Freq: Once | ORAL | Status: AC
  Administered 2021-02-02: 1000 mg via ORAL
  Filled 2021-02-02: qty 2

## 2021-02-02 MED ORDER — SODIUM CHLORIDE 0.9 % IV SOLN: 250.0000 mL | INTRAVENOUS | Status: DC

## 2021-02-02 MED ORDER — POLYETHYLENE GLYCOL 3350 17 G PO PACK: 17.0000 g | PACK | Freq: Every day | ORAL | Status: DC | PRN

## 2021-02-02 MED ORDER — FENTANYL CITRATE (PF) 100 MCG/2ML IJ SOLN
25.0000 ug | INTRAMUSCULAR | Status: DC | PRN
  Administered 2021-02-02 (×3): 50 ug via INTRAVENOUS

## 2021-02-02 MED ORDER — ATORVASTATIN CALCIUM 40 MG PO TABS
40.0000 mg | ORAL_TABLET | Freq: Every day | ORAL | Status: DC
  Administered 2021-02-02 – 2021-02-03 (×2): 40 mg via ORAL
  Filled 2021-02-02 (×2): qty 1

## 2021-02-02 MED ORDER — LIDOCAINE-EPINEPHRINE 1 %-1:100000 IJ SOLN
INTRAMUSCULAR | Status: AC
  Filled 2021-02-02: qty 1

## 2021-02-02 MED ORDER — OXYCODONE HCL 5 MG PO TABS: 5.0000 mg | ORAL_TABLET | ORAL | Status: DC | PRN

## 2021-02-02 MED ORDER — GEMFIBROZIL 600 MG PO TABS
600.0000 mg | ORAL_TABLET | Freq: Two times a day (BID) | ORAL | Status: DC
  Administered 2021-02-03: 600 mg via ORAL
  Filled 2021-02-02 (×2): qty 1

## 2021-02-02 MED ORDER — THROMBIN 5000 UNITS EX SOLR: OROMUCOSAL | Status: DC | PRN

## 2021-02-02 MED ORDER — CEFAZOLIN SODIUM-DEXTROSE 2-4 GM/100ML-% IV SOLN
2.0000 g | Freq: Three times a day (TID) | INTRAVENOUS | Status: DC
  Administered 2021-02-03: 2 g via INTRAVENOUS
  Filled 2021-02-02: qty 100

## 2021-02-02 MED ORDER — THROMBIN 5000 UNITS EX SOLR
CUTANEOUS | Status: AC
  Filled 2021-02-02: qty 5000

## 2021-02-02 MED ORDER — SUGAMMADEX SODIUM 200 MG/2ML IV SOLN
INTRAVENOUS | Status: DC | PRN
  Administered 2021-02-02: 200 mg via INTRAVENOUS

## 2021-02-02 MED ORDER — ACETAMINOPHEN 10 MG/ML IV SOLN
INTRAVENOUS | Status: DC | PRN
  Administered 2021-02-02: 1000 mg via INTRAVENOUS

## 2021-02-02 MED ORDER — DIPHENHYDRAMINE HCL 50 MG/ML IJ SOLN
INTRAMUSCULAR | Status: DC | PRN
  Administered 2021-02-02: 12.5 mg via INTRAVENOUS

## 2021-02-02 MED ORDER — CHLORHEXIDINE GLUCONATE 0.12 % MT SOLN
OROMUCOSAL | Status: AC
  Filled 2021-02-02: qty 15

## 2021-02-02 MED ORDER — MIDAZOLAM HCL 5 MG/5ML IJ SOLN
INTRAMUSCULAR | Status: DC | PRN
  Administered 2021-02-02: 2 mg via INTRAVENOUS

## 2021-02-02 MED ORDER — POTASSIUM CHLORIDE CRYS ER 20 MEQ PO TBCR
40.0000 meq | EXTENDED_RELEASE_TABLET | Freq: Every day | ORAL | Status: DC
  Administered 2021-02-03: 40 meq via ORAL
  Filled 2021-02-02: qty 2

## 2021-02-02 MED ORDER — ACETAMINOPHEN 650 MG RE SUPP: 650.0000 mg | RECTAL | Status: DC | PRN

## 2021-02-02 MED ORDER — OXYCODONE HCL 5 MG PO TABS
10.0000 mg | ORAL_TABLET | ORAL | Status: DC | PRN
  Administered 2021-02-02 – 2021-02-03 (×5): 10 mg via ORAL
  Filled 2021-02-02 (×5): qty 2

## 2021-02-02 MED ORDER — HYDROMORPHONE HCL 1 MG/ML IJ SOLN
1.0000 mg | INTRAMUSCULAR | Status: DC | PRN
  Administered 2021-02-02: 1 mg via INTRAVENOUS
  Filled 2021-02-02: qty 1

## 2021-02-02 MED ORDER — PROPOFOL 10 MG/ML IV BOLUS
INTRAVENOUS | Status: AC
  Filled 2021-02-02: qty 20

## 2021-02-02 MED ORDER — 0.9 % SODIUM CHLORIDE (POUR BTL) OPTIME
TOPICAL | Status: DC | PRN
  Administered 2021-02-02 (×2): 1000 mL

## 2021-02-02 MED ORDER — FENTANYL CITRATE (PF) 100 MCG/2ML IJ SOLN
INTRAMUSCULAR | Status: DC | PRN
  Administered 2021-02-02 (×3): 50 ug via INTRAVENOUS
  Administered 2021-02-02: 100 ug via INTRAVENOUS
  Administered 2021-02-02 (×5): 50 ug via INTRAVENOUS

## 2021-02-02 SURGICAL SUPPLY — 70 items
ADH SKN CLS APL DERMABOND .7 (GAUZE/BANDAGES/DRESSINGS) ×1
BAND INSRT 18 STRL LF DISP RB (MISCELLANEOUS) ×2
BAND RUBBER #18 3X1/16 STRL (MISCELLANEOUS) ×6 IMPLANT
BASKET BONE COLLECTION (BASKET) ×3 IMPLANT
BLADE CLIPPER SURG (BLADE) IMPLANT
BLADE SURG 11 STRL SS (BLADE) ×3 IMPLANT
BNDG ELASTIC 4X5.8 VLCR STR LF (GAUZE/BANDAGES/DRESSINGS) IMPLANT
BUR MATCHSTICK NEURO 3.0 LAGG (BURR) IMPLANT
BUR PRECISION FLUTE 5.0 (BURR) ×2 IMPLANT
BUR PRECISION MATCH 2.5 (BURR) ×2 IMPLANT
BUR PRECISION MATCH 3.0 13 (BURR) ×1 IMPLANT
BUR PRECISION MATCH 3.0 13CM (BURR)
CNTNR URN SCR LID CUP LEK RST (MISCELLANEOUS) ×1 IMPLANT
CONT SPEC 4OZ STRL OR WHT (MISCELLANEOUS) ×3
COVER BACK TABLE 60X90IN (DRAPES) ×3 IMPLANT
COVER WAND RF STERILE (DRAPES) ×1 IMPLANT
DECANTER SPIKE VIAL GLASS SM (MISCELLANEOUS) ×3 IMPLANT
DERMABOND ADVANCED (GAUZE/BANDAGES/DRESSINGS) ×2
DERMABOND ADVANCED .7 DNX12 (GAUZE/BANDAGES/DRESSINGS) ×1 IMPLANT
DEVICE INTERBODY ELEVATE 9X23 (Cage) ×2 IMPLANT
DRAPE C-ARM 42X72 X-RAY (DRAPES) ×3 IMPLANT
DRAPE C-ARMOR (DRAPES) ×3 IMPLANT
DRAPE LAPAROTOMY 100X72X124 (DRAPES) ×3 IMPLANT
DRAPE MICROSCOPE LEICA (MISCELLANEOUS) ×3 IMPLANT
DRAPE SURG 17X23 STRL (DRAPES) ×6 IMPLANT
ELECT BLADE 6.5 EXT (BLADE) ×3 IMPLANT
ELECT REM PT RETURN 9FT ADLT (ELECTROSURGICAL) ×3
ELECTRODE REM PT RTRN 9FT ADLT (ELECTROSURGICAL) ×1 IMPLANT
EXTENDER TAB GUIDE SV 5.5/6.0 (INSTRUMENTS) ×16 IMPLANT
GAUZE 4X4 16PLY RFD (DISPOSABLE) IMPLANT
GAUZE SPONGE 4X4 12PLY STRL (GAUZE/BANDAGES/DRESSINGS) ×3 IMPLANT
GLOVE BIOGEL PI IND STRL 7.5 (GLOVE) ×1 IMPLANT
GLOVE BIOGEL PI INDICATOR 7.5 (GLOVE) ×4
GLOVE ECLIPSE 7.5 STRL STRAW (GLOVE) ×3 IMPLANT
GLOVE ECLIPSE 8.0 STRL XLNG CF (GLOVE) ×4 IMPLANT
GLOVE INDICATOR 8.5 STRL (GLOVE) ×2 IMPLANT
GOWN STRL REUS W/ TWL LRG LVL3 (GOWN DISPOSABLE) ×1 IMPLANT
GOWN STRL REUS W/ TWL XL LVL3 (GOWN DISPOSABLE) IMPLANT
GOWN STRL REUS W/TWL 2XL LVL3 (GOWN DISPOSABLE) ×1 IMPLANT
GOWN STRL REUS W/TWL LRG LVL3 (GOWN DISPOSABLE) ×6
GOWN STRL REUS W/TWL XL LVL3 (GOWN DISPOSABLE)
GUIDEWIRE BLUNT NT 450 (WIRE) ×8 IMPLANT
HEMOSTAT POWDER KIT SURGIFOAM (HEMOSTASIS) ×3 IMPLANT
KIT BASIN OR (CUSTOM PROCEDURE TRAY) ×3 IMPLANT
KIT POSITION SURG JACKSON T1 (MISCELLANEOUS) ×3 IMPLANT
KIT TURNOVER KIT B (KITS) IMPLANT
NDL BEVEL TWO-PAK W/1PK (NEEDLE) IMPLANT
NDL HYPO 18GX1.5 BLUNT FILL (NEEDLE) IMPLANT
NDL SPNL 18GX3.5 QUINCKE PK (NEEDLE) IMPLANT
NEEDLE BEVEL TWO-PAK W/1PK (NEEDLE) ×3 IMPLANT
NEEDLE HYPO 18GX1.5 BLUNT FILL (NEEDLE) IMPLANT
NEEDLE HYPO 22GX1.5 SAFETY (NEEDLE) ×3 IMPLANT
NEEDLE SPNL 18GX3.5 QUINCKE PK (NEEDLE) IMPLANT
NS IRRIG 1000ML POUR BTL (IV SOLUTION) ×3 IMPLANT
PACK LAMINECTOMY NEURO (CUSTOM PROCEDURE TRAY) ×3 IMPLANT
PAD ARMBOARD 7.5X6 YLW CONV (MISCELLANEOUS) ×6 IMPLANT
ROD 5.5 CCM PERC 40 (Rod) ×2 IMPLANT
ROD 5.5X45MM SOLERA VOYAGER (Rod) ×2 IMPLANT
SCREW 6.5X40 VOYAGER MAS FNS (Screw) ×8 IMPLANT
SCREW SET 5.5/6.0MM SOLERA (Screw) ×8 IMPLANT
SPONGE LAP 4X18 RFD (DISPOSABLE) ×2 IMPLANT
SUT MNCRL AB 3-0 PS2 18 (SUTURE) ×3 IMPLANT
SUT VIC AB 0 CT1 18XCR BRD8 (SUTURE) IMPLANT
SUT VIC AB 0 CT1 8-18 (SUTURE)
SUT VIC AB 2-0 CP2 18 (SUTURE) ×5 IMPLANT
SYR 3ML LL SCALE MARK (SYRINGE) IMPLANT
TOWEL GREEN STERILE (TOWEL DISPOSABLE) ×3 IMPLANT
TOWEL GREEN STERILE FF (TOWEL DISPOSABLE) ×3 IMPLANT
TRAY FOLEY MTR SLVR 16FR STAT (SET/KITS/TRAYS/PACK) ×2 IMPLANT
WATER STERILE IRR 1000ML POUR (IV SOLUTION) ×3 IMPLANT

## 2021-02-02 NOTE — H&P (Signed)
Surgical H&P Update  HPI: 50 y.o. man with a history of BLE radicular pain and LBP. Radiographic workup revealed bilateral foraminal as well as central canal stenosis. No changes in health since he was last seen. Still having symptoms and wishes to proceed with surgery.  PMHx:  Past Medical History:  Diagnosis Date  . Acute midline low back pain with bilateral sciatica 05/16/2020  . Anxiety   . Cancer of transverse colon ([place]) 09/17/2016   surgery done  . Colon cancer [facility]) 1998   surgery and chemo  . Complication of anesthesia    not fully under for polyp removal at [place] 2017  . Depression   . Emphysema of lung ([place])   . Family hx of colon cancer   . GERD (gastroesophageal reflux disease)    none recent  . Headache   . Hyperlipidemia   . Hypertension   . Vitamin D deficiency    FamHx:  Family History  Problem Relation Age of Onset  . Colon cancer Father        pt thinks he was in his 20's when dx  . Esophageal cancer Neg Hx   . Rectal cancer Neg Hx   . Stomach cancer Neg Hx   . Prostate cancer Neg Hx    SocHx:  reports that he quit smoking about 3 months ago. His smoking use included cigarettes. He has a 23.00 pack-year smoking history. He has never used smokeless tobacco. He reports current alcohol use of about 24.0 standard drinks of alcohol per week. He reports that he does not use drugs.  Physical Exam: Strength 5/5 except for right EHL 4/5, SILTx4 except for right L4 numbness  Assesment/Plan: 50 y.o. man with lumbar radiculopathy 2/2 foraminal and central canal stenosis, here for MIS L4-5 TLIF. Risks, benefits, and alternatives discussed and the patient would like to continue with surgery.  -OR today -3C post-op  Judith Part, MD 02/02/21 2:45 PM

## 2021-02-02 NOTE — Brief Op Note (Signed)
02/02/2021  7:23 PM  PATIENT:  Charles Santos  50 y.o. male  PRE-OPERATIVE DIAGNOSIS:  Spinal stenosis, Lumbar region with neurogenic claudication  POST-OPERATIVE DIAGNOSIS:  Spinal stenosis, Lumbar region with neurogenic claudication  PROCEDURE:  Procedure(s): Right Lumbar four -lumbar five minimally invasive transforaminal lumbar interbody fusion (Right)  SURGEON:  Surgeon(s) and Role:    * Judith Part, MD - Primary  PHYSICIAN ASSISTANT:   ASSISTANTS: Kary Kos, MD   ANESTHESIA:   general  EBL:  150 mL   BLOOD ADMINISTERED:none  DRAINS: none   LOCAL MEDICATIONS USED:  LIDOCAINE   SPECIMEN:  No Specimen  DISPOSITION OF SPECIMEN:  N/A  COUNTS:  YES  TOURNIQUET:  * No tourniquets in log *  DICTATION: .Note written in EPIC  PLAN OF CARE: Admit to inpatient   PATIENT DISPOSITION:  PACU - hemodynamically stable.   Delay start of Pharmacological VTE agent (>24hrs) due to surgical blood loss or risk of bleeding: yes

## 2021-02-02 NOTE — Transfer of Care (Signed)
Immediate Anesthesia Transfer of Care Note  Patient: Charles Santos  Procedure(s) Performed: Right Lumbar four -lumbar five minimally invasive transforaminal lumbar interbody fusion (Right Spine Lumbar)  Patient Location: PACU  Anesthesia Type:General  Level of Consciousness: awake  Airway & Oxygen Therapy: Patient Spontanous Breathing  Post-op Assessment: Report given to RN and Post -op Vital signs reviewed and stable  Post vital signs: Reviewed and stable  Last Vitals:  Vitals Value Taken Time  BP 165/95 02/02/21 1948  Temp 36.1 C 02/02/21 1945  Pulse 98 02/02/21 1956  Resp 15 02/02/21 1956  SpO2 99 % 02/02/21 1956  Vitals shown include unvalidated device data.  Last Pain:  Vitals:   02/02/21 1945  TempSrc:   PainSc: Asleep         Complications: No complications documented.

## 2021-02-02 NOTE — Op Note (Signed)
PATIENT: Charles Santos  DAY OF SURGERY: 02/02/21   PRE-OPERATIVE DIAGNOSIS:  Lumbar radiculopathy   POST-OPERATIVE DIAGNOSIS:  Same   PROCEDURE:  L4-L5 minimally invasive transforaminal lumbar interbody fusion with bilateral L4-L5 pedicle screw placement   SURGEON:  Surgeon(s) and Role:    Judith Part, MD - Primary    Kary Kos, MD - Assisting   ANESTHESIA: ETGA   BRIEF HISTORY: This is a 50 year old man who presented with bilateral lower extremity radicular pain and low back pain. The patient was found to have central canal and bilateral foraminal stenosis with severe facet disease. This was discussed with the patient as well as risks, benefits, and alternatives and wished to proceed with surgery.   OPERATIVE DETAIL:  The patient was taken to the operating room and placed on the OR table in the prone position. A formal time out was performed with two patient identifiers and confirmed the operative site. Anesthesia was induced by the anesthesia team. The operative site was marked, hair was clipped with surgical clippers, the area was then prepped and draped in a sterile fashion.   Fluoroscopy was used to localize the surgical level. The pedicles were marked and used to create skin incisions bilaterally. With fluoro guidance, Jamshidi needles were used to guide K-wires into the bilateral L4 and L5 pedicles. The K wires were then secured with hemostats and attention turned to the TLIF.  A [employer] tube was then docked to the left L4-5 facet through the same incision using fluoroscopy. A left L4-5 facetectomy was performed and the left L4 nerve root was decompressed along its entire course. The tube was wanded medially and the decompression was continued medially until reaching the contralateral foramen. The tube was wanded back to the disc space. The disc space was identified, incised, and a discectomy was performed in the standard fashion. The endplates were prepped, bone graft was packed  into the disc space, and an expandable cage ([employer]) was packed with autograft and placed into the disc space with fluoroscopic confirmation. The tube was removed and hemostasis was obtained during its removal.   Using the previously placed K wires, a tap and then screw with tower were placed bilaterally at L4 and L5. A rod was sized and introduced on both sides, confirmed with fluoroscopy, then final tightened. Hemostasis was again confirmed for both incisions, they were copiously irrigated, and then closed in layers.    EBL:  173mL   DRAINS: none   SPECIMENS: none   Judith Part, MD 02/02/21 7:24 PM

## 2021-02-02 NOTE — Progress Notes (Signed)
Neurosurgery Service Post-operative progress note  Assessment & Plan: 50 y.o. man s/p L4-5 MIS TLIF, seen in PACU, MAEx4, still recovering from anesthesia.  -admit to [facility] -likely discharge in AM if doing well  -activity and diet as tolerated, no brace needed  Judith Part  02/02/21 8:32 PM

## 2021-02-02 NOTE — Anesthesia Preprocedure Evaluation (Addendum)
Anesthesia Evaluation  Patient identified by MRN, date of birth, ID band Patient awake    Reviewed: Allergy & Precautions, NPO status , Patient's Chart, lab work & pertinent test results  History of Anesthesia Complications (+) DIFFICULT AIRWAY and history of anesthetic complications  Airway Mallampati: III  TM Distance: <3 FB Neck ROM: Full    Dental  (+) Dental Advisory Given, Chipped, Poor Dentition,    Pulmonary COPD, former smoker,    Pulmonary exam normal breath sounds clear to auscultation       Cardiovascular hypertension, Pt. on medications Normal cardiovascular exam Rhythm:Regular Rate:Normal     Neuro/Psych  Headaches, PSYCHIATRIC DISORDERS Anxiety Depression  Neuromuscular disease (Spinal stenosis, Lumbar region with neurogenic claudication)    GI/Hepatic GERD  Medicated,(+)     substance abuse  alcohol use, H/o colon cancer   Endo/Other  negative endocrine ROSObesity   Renal/GU negative Renal ROS     Musculoskeletal negative musculoskeletal ROS (+)   Abdominal   Peds  Hematology negative hematology ROS (+)   Anesthesia Other Findings Day of surgery medications reviewed with the patient.  Reproductive/Obstetrics                          Anesthesia Physical Anesthesia Plan  ASA: II  Anesthesia Plan: General   Post-op Pain Management:    Induction: Intravenous  PONV Risk Score and Plan: 2 and Midazolam, Dexamethasone and Ondansetron  Airway Management Planned: Oral ETT and Video Laryngoscope Planned  Additional Equipment:   Intra-op Plan:   Post-operative Plan: Extubation in OR  Informed Consent: I have reviewed the patients History and Physical, chart, labs and discussed the procedure including the risks, benefits and alternatives for the proposed anesthesia with the patient or authorized representative who has indicated his/her understanding and acceptance.      Dental advisory given  Plan Discussed with: CRNA  Anesthesia Plan Comments:        Anesthesia Quick Evaluation

## 2021-02-02 NOTE — Anesthesia Postprocedure Evaluation (Signed)
Anesthesia Post Note  Patient: Charles Santos  Procedure(s) Performed: Right Lumbar four -lumbar five minimally invasive transforaminal lumbar interbody fusion (Right Spine Lumbar)     Patient location during evaluation: PACU Anesthesia Type: General Level of consciousness: awake Pain management: pain level controlled Vital Signs Assessment: post-procedure vital signs reviewed and stable Respiratory status: spontaneous breathing, nonlabored ventilation, respiratory function stable and patient connected to nasal cannula oxygen Cardiovascular status: blood pressure returned to baseline and stable Postop Assessment: no apparent nausea or vomiting Anesthetic complications: no   No complications documented.  Last Vitals:  Vitals:   02/02/21 2045 02/02/21 2109  BP: (!) 158/96 (!) 194/105  Pulse: 91 93  Resp: 12 20  Temp: (!) 36.1 C 37 C  SpO2: 99% 100%    Last Pain:  Vitals:   02/02/21 2109  TempSrc: Oral  PainSc:                  Karyl Kinnier Jerrald [place]

## 2021-02-02 NOTE — Anesthesia Procedure Notes (Signed)
Procedure Name: Intubation Performed by: Cleda Daub, CRNA Pre-anesthesia Checklist: Patient identified, Emergency Drugs available, Suction available and Patient being monitored Patient Re-evaluated:Patient Re-evaluated prior to induction Oxygen Delivery Method: Circle system utilized Preoxygenation: Pre-oxygenation with 100% oxygen Induction Type: IV induction Ventilation: Mask ventilation without difficulty Laryngoscope Size: Glidescope and 4 Grade View: Grade I Tube type: Oral Tube size: 7.5 mm Number of attempts: 1 Airway Equipment and Method: Stylet and Oral airway Placement Confirmation: ETT inserted through vocal cords under direct vision,  positive ETCO2 and breath sounds checked- equal and bilateral Secured at: 23 cm Tube secured with: Tape Dental Injury: Teeth and Oropharynx as per pre-operative assessment  Comments: Successful glidescope intubation; no neck manipulation during induction and intubation; unable to maintain cuff pressure after intubation; tube exchanged with a bugie; O2 Sat maintained at 99-100%. VSs.

## 2021-02-03 ENCOUNTER — Encounter (HOSPITAL_COMMUNITY): Payer: Self-pay | Admitting: Neurological Surgery

## 2021-02-03 MED ORDER — OXYCODONE HCL 5 MG PO TABS: 5.0000 mg | ORAL_TABLET | ORAL | 0 refills | Status: DC | PRN

## 2021-02-03 MED ORDER — CYCLOBENZAPRINE HCL 10 MG PO TABS: 10.0000 mg | ORAL_TABLET | Freq: Three times a day (TID) | ORAL | 0 refills | Status: DC | PRN

## 2021-02-03 NOTE — Progress Notes (Signed)
Patient is discharged from room 3C08 at this time. Alert and in stable condition. IV site d/c'd and instructions read to patient and son with understanding verbalized and all questions answered. Left unit via wheelchair with all belongings at side.

## 2021-02-03 NOTE — Evaluation (Signed)
Physical Therapy Evaluation Patient Details Name: Charles Santos MRN: [id number] DOB: [birth year] Today's Date: 02/03/2021   History of Present Illness  50 y.o. male presenting with lumbar radiculopathy s/p L4-5 minimally invasive TLIF.   PMHx significant for lumbago, anxiety/depression, Hx colon cancer, emphysema, HLD, and HTN.    Clinical Impression  Pt admitted with above diagnosis. At the time of PT eval, pt was able to demonstrate transfers and ambulation with gross min guard assist to supervision for safety with RW for support. Grossly pain limiting pt however pt appeared to become more comfortable as gait training progressed. Pt was educated on precautions, brace application/wearing schedule, appropriate activity progression, and car transfer. Pt currently with functional limitations due to the deficits listed below (see PT Problem List). Pt will benefit from skilled PT to increase their independence and safety with mobility to allow discharge to the venue listed below.      Follow Up Recommendations No PT follow up;Supervision for mobility/OOB    Equipment Recommendations  Rolling Keach with 5" wheels    Recommendations for Other Services       Precautions / Restrictions Precautions Precautions: Back Precaution Booklet Issued: Yes (comment) Precaution Comments: Reviewed written handout. Patient would benefit from continued education. Required Braces or Orthoses:  (No brace needed) Restrictions Weight Bearing Restrictions: No      Mobility  Bed Mobility Overal bed mobility: Needs Assistance Bed Mobility: Rolling;Sidelying to Sit;Sit to Sidelying Rolling: Supervision Sidelying to sit: Min guard     Sit to sidelying: Supervision General bed mobility comments: Slow and guarded due to pain. HOB elevated for ease of transfer. Pt utilized railings for log roll technique. VC's throughout to maintain precautions.    Transfers Overall transfer level: Needs assistance Equipment  used: Rolling Arizmendi (2 wheeled) Transfers: Sit to/from Stand Sit to Stand: Min guard         General transfer comment: Close guard for safety as pt powered up to full standing position. Increased time required and VC's for hand placement on seated surface for safety.  Ambulation/Gait Ambulation/Gait assistance: Min guard;Supervision Gait Distance (Feet): 300 Feet Assistive device: Rolling Page (2 wheeled) Gait Pattern/deviations: Step-through pattern;Decreased stride length;Trunk flexed Gait velocity: Decreased Gait velocity interpretation: <1.8 ft/sec, indicate of risk for recurrent falls General Gait Details: VC's for improved posture and general safety. Short standing rest breaks required initially due to pain however as distance progressed pt appears more comfortable.  Stairs            Wheelchair Mobility    Modified Rankin (Stroke Patients Only)       Balance Overall balance assessment: Needs assistance Sitting-balance support: Bilateral upper extremity supported;Feet supported Sitting balance-Leahy Scale: Good Sitting balance - Comments: BUE braced on bed surface 2/2 pain.   Standing balance support: Bilateral upper extremity supported;During functional activity Standing balance-Leahy Scale: Fair Standing balance comment: Reliant on BUE support on RW. Able to maintain standing balance without UE support on RW durign LB dressing with Min guard for safety.                             Pertinent Vitals/Pain Pain Assessment: Faces Pain Score: 10-Worst pain ever Faces Pain Scale: Hurts even more Pain Location: Low back at incision; LE's Pain Descriptors / Indicators: Tightness;Operative site guarding Pain Intervention(s): Limited activity within patient's tolerance;Monitored during session;Repositioned    Home Living Family/patient expects to be discharged to:: Private residence Living Arrangements: Children (Adult son (23  y.o.) and adult daughter  (35 y.o.)) Available Help at Discharge: Family;Available 24 hours/day Type of Home: Apartment (1st floor) Home Access: Level entry     Home Layout: One level Home Equipment: None      Prior Function Level of Independence: Independent         Comments: Driving and working full-time at a [employer].     Hand Dominance   Dominant Hand: Right    Extremity/Trunk Assessment   Upper Extremity Assessment Upper Extremity Assessment: Defer to OT evaluation    Lower Extremity Assessment Lower Extremity Assessment: Generalized weakness (Consistent with pre-op diagnosis)    Cervical / Trunk Assessment Cervical / Trunk Assessment: Other exceptions Cervical / Trunk Exceptions: s/p spinal surgery.  Communication   Communication: No difficulties  Cognition Arousal/Alertness: Awake/alert Behavior During Therapy: Flat affect Overall Cognitive Status: Within Functional Limits for tasks assessed Area of Impairment: Memory                     Memory: Decreased recall of precautions         General Comments: Patient with difficulty recalling home set-up. Reports feeling "loopy" from medication. Appears more clear at conclusion of session.      General Comments General comments (skin integrity, edema, etc.): Incision open to RA.    Exercises     Assessment/Plan    PT Assessment Patient needs continued PT services  PT Problem List Decreased strength;Decreased activity tolerance;Decreased balance;Decreased mobility;Decreased knowledge of use of DME;Decreased safety awareness;Decreased knowledge of precautions;Pain       PT Treatment Interventions DME instruction;Gait training;Functional mobility training;Therapeutic activities;Therapeutic exercise;Neuromuscular re-education;Patient/family education    PT Goals (Current goals can be found in the Care Plan section)  Acute Rehab PT Goals Patient Stated Goal: To decrease pain. PT Goal Formulation: With  patient Time For Goal Achievement: 02/10/21 Potential to Achieve Goals: Good    Frequency Min 5X/week   Barriers to discharge        Co-evaluation               AM-PAC PT "6 Clicks" Mobility  Outcome Measure Help needed turning from your back to your side while in a flat bed without using bedrails?: None Help needed moving from lying on your back to sitting on the side of a flat bed without using bedrails?: A Little Help needed moving to and from a bed to a chair (including a wheelchair)?: A Little Help needed standing up from a chair using your arms (e.g., wheelchair or bedside chair)?: A Little Help needed to walk in hospital room?: A Little Help needed climbing 3-5 steps with a railing? : A Little 6 Click Score: 19    End of Session Equipment Utilized During Treatment: Gait belt Activity Tolerance: Patient limited by pain Patient left: in bed;with call bell/phone within reach Nurse Communication: Mobility status PT Visit Diagnosis: Unsteadiness on feet (R26.81);Pain Pain - part of body:  (back)    Time: 2297-9892 PT Time Calculation (min) (ACUTE ONLY): 23 min   Charges:   PT Evaluation $PT Eval Low Complexity: 1 Low PT Treatments $Gait Training: 8-22 mins        Rolinda Roan, PT, DPT Acute Rehabilitation Services Pager: [phone] Office: [phone]   Thelma Comp 02/03/2021, 10:44 AM

## 2021-02-03 NOTE — Discharge Instructions (Addendum)
Discharge Instructions ° °No restriction in activities, slowly increase your activity back to normal.  ° °Your incision is closed with dermabond (purple glue). This will naturally fall off over the next 1-2 weeks.  ° °Okay to shower on the day of discharge. Use regular soap and water and try to be gentle when cleaning your incision.  ° °Follow up with Dr. Ostergard in 2 weeks after discharge. If you do not already have a discharge appointment, please call his office at [phone] to schedule a follow up appointment. If you have any concerns or questions, please call the office and let us know. °

## 2021-02-03 NOTE — Evaluation (Signed)
Occupational Therapy Evaluation Patient Details Name: Charles Santos MRN: [id number] DOB: Nov 29, 1971 Today's Date: 02/03/2021    History of Present Illness 50 y.o. male presenting with lumbar radiculopathy s/p L4-5 minimally invasive TLIF.   PMHx significant for lumbago, anxiety/depression, Hx colon cancer, emphysema, HLD, and HTN.   Clinical Impression   PTA patient was living alone in a 1st floor apartment and was independent with ADLs/IADLs without AD. 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 Patient currently presents below baseline level of function demonstrating Min A overall for bed mobility and LB dressing with use of AE and RW. Patient limited by reported 10/10 pain at incision and onset of nausea limiting further functional mobility at this time. OT provided education on spinal precautions and acquisition/use of AE. Patient expressed verbal understanding but would benefit from further education. Patient would benefit from continued acute OT services to maximize safety and independence with self-care tasks in prep for safe d/c home.     Follow Up Recommendations  No OT follow up;Supervision/Assistance - 24 hour    Equipment Recommendations  3 in 1 bedside commode;Other (comment) (Rolling Totty)    Recommendations for Other Services       Precautions / Restrictions Precautions Precautions: Back Precaution Booklet Issued: Yes (comment) Precaution Comments: Reviewed written handout. Patient would benefit from continued education. Required Braces or Orthoses:  (No brace needed) Restrictions Weight Bearing Restrictions: No      Mobility Bed Mobility Overal bed mobility: Needs Assistance Bed Mobility: Rolling;Sidelying to Sit;Sit to Supine Rolling: Supervision Sidelying to sit: Min assist       General bed mobility comments: Supervision A with increased time/effort to roll to L. Cues for log rolling technique. Min A at trunk for sidelying to sit  with heavy use of rail. Patient able to return to supine with supervision A and increased time/effor with cues for adherence to back precautions.    Transfers Overall transfer level: Needs assistance Equipment used: Rolling Cressman (2 wheeled) Transfers: Sit to/from Stand Sit to Stand: Min assist         General transfer comment: Heavy Min A with EOB slightly elevated and cues for hand placement. Increased time/effort.    Balance Overall balance assessment: Needs assistance Sitting-balance support: Bilateral upper extremity supported;Feet supported Sitting balance-Leahy Scale: Good Sitting balance - Comments: BUE braced on bed surface 2/2 pain.   Standing balance support: Bilateral upper extremity supported;During functional activity Standing balance-Leahy Scale: Fair Standing balance comment: Reliant on BUE support on RW. Able to maintain standing balance without UE support on RW durign LB dressing with Min guard for safety.                           ADL either performed or assessed with clinical judgement   ADL Overall ADL's : Needs assistance/impaired                     Lower Body Dressing: Minimal assistance;Sit to/from stand Lower Body Dressing Details (indicate cue type and reason): Patient able to don underwear with use of recher and Min guard for steadying in standing. Min A for sit to stand from EOB to RW. Increased time/effort and cues for use of AE.               General ADL Comments: Patient limited by severe pain in low back at incision. Deferred further mobility 2/2 nausea and pain.     Vision  Baseline Vision/History: Wears glasses Wears Glasses: At all times Patient Visual Report: No change from baseline       Perception     Praxis      Pertinent Vitals/Pain Pain Assessment: 0-10 Pain Score: 10-Worst pain ever Pain Location: Low back at incision. Pain Descriptors / Indicators: Tightness Pain Intervention(s): Limited activity  within patient's tolerance;Monitored during session;Premedicated before session;Repositioned     Hand Dominance Right   Extremity/Trunk Assessment Upper Extremity Assessment Upper Extremity Assessment: Overall WFL for tasks assessed   Lower Extremity Assessment Lower Extremity Assessment: Defer to PT evaluation   Cervical / Trunk Assessment Cervical / Trunk Assessment: Other exceptions Cervical / Trunk Exceptions: s/p spinal surgery.   Communication Communication Communication: No difficulties   Cognition Arousal/Alertness: Lethargic;Suspect due to medications Behavior During Therapy: Flat affect Overall Cognitive Status: Impaired/Different from baseline Area of Impairment: Memory                     Memory: Decreased recall of precautions         General Comments: Patient with difficulty recalling home set-up. Reports feeling "loopy" from medication. Appears more clear at conclusion of session.   General Comments  Incision open to RA.    Exercises     Shoulder Instructions      Home Living Family/patient expects to be discharged to:: Private residence Living Arrangements: Children (Adult son (77 y.o.) and adult daughter (84 y.o.)) Available Help at Discharge: Family;Available 24 hours/day Type of Home: Apartment (1st floor) Home Access: Level entry     Home Layout: One level     Bathroom Shower/Tub: Teacher, early years/pre: Standard     Home Equipment: None          Prior Functioning/Environment Level of Independence: Independent        Comments: Driving and working full-time at a [employer].        OT Problem List: Decreased activity tolerance;Impaired balance (sitting and/or standing);Decreased knowledge of use of DME or AE;Decreased knowledge of precautions;Pain      OT Treatment/Interventions: Self-care/ADL training;Therapeutic exercise;Energy conservation;DME and/or AE instruction;Therapeutic  activities;Patient/family education;Balance training    OT Goals(Current goals can be found in the care plan section) Acute Rehab OT Goals Patient Stated Goal: To decrease pain. OT Goal Formulation: With patient Time For Goal Achievement: 02/17/21 Potential to Achieve Goals: Good ADL Goals Pt Will Perform Grooming: with supervision;standing Pt Will Perform Lower Body Dressing: with supervision;sit to/from stand Pt Will Transfer to Toilet: with supervision;ambulating Pt Will Perform Toileting - Clothing Manipulation and hygiene: with supervision;sit to/from stand Pt Will Perform Tub/Shower Transfer: with supervision;3 in 1 Additional ADL Goal #1: Patient will recall 3/3 back precautions and demo good adherence during ADLs with LRAD and AE PRN.  OT Frequency: Min 2X/week   Barriers to D/C:            Co-evaluation              AM-PAC OT "6 Clicks" Daily Activity     Outcome Measure Help from another person eating meals?: None Help from another person taking care of personal grooming?: A Little Help from another person toileting, which includes using toliet, bedpan, or urinal?: A Little Help from another person bathing (including washing, rinsing, drying)?: A Lot Help from another person to put on and taking off regular upper body clothing?: A Little Help from another person to put on and taking off regular lower body clothing?: A Little 6  Click Score: 18   End of Session Equipment Utilized During Treatment: Rolling Landress  Activity Tolerance: Patient limited by lethargy;Patient limited by pain Patient left: in bed;with call bell/phone within reach  OT Visit Diagnosis: Unsteadiness on feet (R26.81);Muscle weakness (generalized) (M62.81);Pain Pain - Right/Left:  (bilateral) Pain - part of body:  (low back)                Time: 8315-1761 OT Time Calculation (min): 25 min Charges:  OT General Charges $OT Visit: 1 Visit OT Evaluation $OT Eval Moderate Complexity: 1  Mod OT Treatments $Self Care/Home Management : 8-22 mins  Charles Santos H. OTR/L Supplemental OT, Department of rehab services [phone]  Jarl Sellitto R H. 02/03/2021, 9:02 AM

## 2021-02-03 NOTE — Progress Notes (Signed)
Neurosurgery Service Progress Note  Subjective: No acute events overnight, leg pain resolved post-op, just c/o low back pain, numbness improved   Objective: Vitals:   02/02/21 2030 02/02/21 2045 02/02/21 2109 02/02/21 2323  BP: (!) 129/94 (!) 158/96 (!) 194/105 (!) 168/84  Pulse: 96 91 93 92  Resp: 13 12 20 20   Temp:  (!) 97 F (36.1 C) 98.6 F (37 C) 98.4 F (36.9 C)  TempSrc:   Oral Oral  SpO2: 100% 99% 100% 99%  Weight:      Height:        Physical Exam: Strength 5/5 x4 except 4/5 in R EHL, SILTx4 Incisions c/d/i  Assessment & Plan: 50 y.o. man s/p L4-5 MIS TLIF, recovering well.  -discharge home today  Judith Part  02/03/21 7:39 AM

## 2021-02-03 NOTE — Discharge Summary (Signed)
Discharge Summary  Date of Admission: 02/02/2021  Date of Discharge: 02/03/21  Attending Physician: Emelda Brothers, MD  Hospital Course: Patient was admitted following an uncomplicated W0-9 MIS TLIF. He was recovered in PACU and transferred to [facility]. His symptoms resolved immediately post-op except for R EHL weakness, which was improving. His hospital course was uncomplicated and the patient was discharged home on POD1. He will follow up in clinic with me in 2 weeks.  Neurologic exam at discharge:  Strength 5/5 x4 except R EHL 4/5, SILTx4  Discharge diagnosis: Lumbar radiculopathy  Judith Part, MD 02/03/21 7:42 AM

## 2021-02-05 ENCOUNTER — Ambulatory Visit (HOSPITAL_COMMUNITY): Admit: 2021-02-05 | Payer: 59 | Admitting: Gastroenterology

## 2021-02-05 ENCOUNTER — Encounter (HOSPITAL_COMMUNITY): Payer: Self-pay

## 2021-02-05 SURGERY — SIGMOIDOSCOPY, FLEXIBLE
Anesthesia: Monitor Anesthesia Care

## 2021-02-06 ENCOUNTER — Inpatient Hospital Stay (HOSPITAL_COMMUNITY)
Admission: EM | Admit: 2021-02-06 | Discharge: 2021-02-12 | DRG: 896 | Disposition: A | Discharge status: Home / Self Care | Payer: 59 | Attending: Internal Medicine | Admitting: Internal Medicine

## 2021-02-06 ENCOUNTER — Emergency Department (HOSPITAL_COMMUNITY): Payer: 59

## 2021-02-06 ENCOUNTER — Other Ambulatory Visit: Payer: Self-pay

## 2021-02-06 ENCOUNTER — Encounter (HOSPITAL_COMMUNITY): Payer: Self-pay | Admitting: *Deleted

## 2021-02-06 DIAGNOSIS — K567 Ileus, unspecified: Secondary | ICD-10-CM

## 2021-02-06 DIAGNOSIS — E861 Hypovolemia: Secondary | ICD-10-CM | POA: Diagnosis present

## 2021-02-06 DIAGNOSIS — Z8249 Family history of ischemic heart disease and other diseases of the circulatory system: Secondary | ICD-10-CM

## 2021-02-06 DIAGNOSIS — K529 Noninfective gastroenteritis and colitis, unspecified: Secondary | ICD-10-CM | POA: Diagnosis present

## 2021-02-06 DIAGNOSIS — R443 Hallucinations, unspecified: Secondary | ICD-10-CM

## 2021-02-06 DIAGNOSIS — R339 Retention of urine, unspecified: Secondary | ICD-10-CM | POA: Diagnosis present

## 2021-02-06 DIAGNOSIS — E7220 Disorder of urea cycle metabolism, unspecified: Secondary | ICD-10-CM | POA: Diagnosis present

## 2021-02-06 DIAGNOSIS — I10 Essential (primary) hypertension: Secondary | ICD-10-CM | POA: Diagnosis present

## 2021-02-06 DIAGNOSIS — C184 Malignant neoplasm of transverse colon: Secondary | ICD-10-CM | POA: Diagnosis present

## 2021-02-06 DIAGNOSIS — Z20822 Contact with and (suspected) exposure to covid-19: Secondary | ICD-10-CM | POA: Diagnosis present

## 2021-02-06 DIAGNOSIS — Z87891 Personal history of nicotine dependence: Secondary | ICD-10-CM

## 2021-02-06 DIAGNOSIS — Z981 Arthrodesis status: Secondary | ICD-10-CM

## 2021-02-06 DIAGNOSIS — E162 Hypoglycemia, unspecified: Secondary | ICD-10-CM | POA: Diagnosis not present

## 2021-02-06 DIAGNOSIS — Z85038 Personal history of other malignant neoplasm of large intestine: Secondary | ICD-10-CM

## 2021-02-06 DIAGNOSIS — J439 Emphysema, unspecified: Secondary | ICD-10-CM | POA: Diagnosis present

## 2021-02-06 DIAGNOSIS — Z1509 Genetic susceptibility to other malignant neoplasm: Secondary | ICD-10-CM | POA: Diagnosis present

## 2021-02-06 DIAGNOSIS — E871 Hypo-osmolality and hyponatremia: Secondary | ICD-10-CM | POA: Diagnosis present

## 2021-02-06 DIAGNOSIS — K7010 Alcoholic hepatitis without ascites: Secondary | ICD-10-CM | POA: Diagnosis present

## 2021-02-06 DIAGNOSIS — E876 Hypokalemia: Secondary | ICD-10-CM | POA: Diagnosis present

## 2021-02-06 DIAGNOSIS — Z9049 Acquired absence of other specified parts of digestive tract: Secondary | ICD-10-CM

## 2021-02-06 DIAGNOSIS — K219 Gastro-esophageal reflux disease without esophagitis: Secondary | ICD-10-CM | POA: Diagnosis present

## 2021-02-06 DIAGNOSIS — E785 Hyperlipidemia, unspecified: Secondary | ICD-10-CM | POA: Diagnosis present

## 2021-02-06 DIAGNOSIS — Z800 Family history of malignant neoplasm of digestive organs: Secondary | ICD-10-CM

## 2021-02-06 DIAGNOSIS — G9341 Metabolic encephalopathy: Secondary | ICD-10-CM | POA: Diagnosis present

## 2021-02-06 DIAGNOSIS — R109 Unspecified abdominal pain: Secondary | ICD-10-CM

## 2021-02-06 DIAGNOSIS — F10231 Alcohol dependence with withdrawal delirium: Principal | ICD-10-CM | POA: Diagnosis present

## 2021-02-06 DIAGNOSIS — R410 Disorientation, unspecified: Secondary | ICD-10-CM | POA: Diagnosis present

## 2021-02-06 DIAGNOSIS — R7989 Other specified abnormal findings of blood chemistry: Secondary | ICD-10-CM | POA: Diagnosis present

## 2021-02-06 DIAGNOSIS — M549 Dorsalgia, unspecified: Secondary | ICD-10-CM

## 2021-02-06 LAB — COMPREHENSIVE METABOLIC PANEL
ALT: 138 U/L — ABNORMAL HIGH (ref 0–44)
AST: 330 U/L — ABNORMAL HIGH (ref 15–41)
Albumin: 2.8 g/dL — ABNORMAL LOW (ref 3.5–5.0)
Alkaline Phosphatase: 87 U/L (ref 38–126)
Anion gap: 12 (ref 5–15)
BUN: 10 mg/dL (ref 6–20)
CO2: 34 mmol/L — ABNORMAL HIGH (ref 22–32)
Calcium: 8.9 mg/dL (ref 8.9–10.3)
Chloride: 75 mmol/L — ABNORMAL LOW (ref 98–111)
Creatinine, Ser: 0.9 mg/dL (ref 0.61–1.24)
GFR, Estimated: >60 mL/min (ref >60)
Glucose, Bld: 113 mg/dL — ABNORMAL HIGH (ref 70–99)
Potassium: 3 mmol/L — ABNORMAL LOW (ref 3.5–5.1)
Sodium: 121 mmol/L — ABNORMAL LOW (ref 135–145)
Total Bilirubin: 1.8 mg/dL — ABNORMAL HIGH (ref 0.3–1.2)
Total Protein: 6.4 g/dL — ABNORMAL LOW (ref 6.5–8.1)

## 2021-02-06 LAB — URINALYSIS, ROUTINE W REFLEX MICROSCOPIC
Bacteria, UA: NONE SEEN
Bilirubin Urine: NEGATIVE
Glucose, UA: NEGATIVE mg/dL
Ketones, ur: NEGATIVE mg/dL
Leukocytes,Ua: NEGATIVE
Nitrite: NEGATIVE
Protein, ur: NEGATIVE mg/dL
Specific Gravity, Urine: 1.011 (ref 1.005–1.030)
pH: 6 (ref 5.0–8.0)

## 2021-02-06 LAB — AMMONIA: Ammonia: 39 umol/L — ABNORMAL HIGH (ref 9–35)

## 2021-02-06 LAB — SODIUM, URINE, RANDOM: Sodium, Ur: <10 mmol/L

## 2021-02-06 LAB — CBC
HCT: 34.9 % — ABNORMAL LOW (ref 39.0–52.0)
Hemoglobin: 12.7 g/dL — ABNORMAL LOW (ref 13.0–17.0)
MCH: 34.7 pg — ABNORMAL HIGH (ref 26.0–34.0)
MCHC: 36.4 g/dL — ABNORMAL HIGH (ref 30.0–36.0)
MCV: 95.4 fL (ref 80.0–100.0)
Platelets: 175 10*3/uL (ref 150–400)
RBC: 3.66 MIL/uL — ABNORMAL LOW (ref 4.22–5.81)
RDW: 13.2 % (ref 11.5–15.5)
WBC: 8.8 10*3/uL (ref 4.0–10.5)
nRBC: 0 % (ref 0.0–0.2)

## 2021-02-06 LAB — PROTIME-INR
INR: 1 (ref 0.8–1.2)
Prothrombin Time: 12.8 seconds (ref 11.4–15.2)

## 2021-02-06 LAB — LACTIC ACID, PLASMA: Lactic Acid, Venous: 1.6 mmol/L (ref 0.5–1.9)

## 2021-02-06 LAB — OSMOLALITY, URINE: Osmolality, Ur: 330 mOsm/kg (ref 300–900)

## 2021-02-06 MED ORDER — IOHEXOL 300 MG/ML  SOLN
100.0000 mL | Freq: Once | INTRAMUSCULAR | Status: AC | PRN
  Administered 2021-02-06: 100 mL via INTRAVENOUS

## 2021-02-06 MED ORDER — SODIUM CHLORIDE 0.9 % IV SOLN: Freq: Once | INTRAVENOUS | Status: AC

## 2021-02-06 NOTE — ED Triage Notes (Signed)
Pt was discharged from [facility] on 2/22 after having back surgery.  Family states that since he's been home he's been having hallucinations. Pt attempted to hand family "imaginary" earbuds  In triage and swatting at things that aren't there.  Pt is aware of the month and day of the week and is able to state correct age.  Surgery to repair numbness to legs and fingers of L hand.

## 2021-02-06 NOTE — ED Provider Notes (Signed)
[place] [facility] Provider Note   CSN: [id number] Arrival date & time: 02/06/21  1744     History Chief Complaint  Patient presents with  . Hallucinations    [name] is a 50 y.o. male.  Patient presents due to continued hallucinations after recent back surgery.  Patient recuperated per his son.  Son states that the patient has been staying with the patient's daughter this past week since the surgery.  Has been experiencing visual hallucinations and confusion.  Patient also endorses auditory hallucinations although not currently.  Did not have issues with this prior to surgery.  Patient also complaining of abdominal pain.  Denies vomiting or diarrhea.  Has had bowel movements since discharge.  Patient denies headache, dizziness, vertigo, blurred vision, weakness, chest pain or shortness of breath.  Son states that abdomen has been enlarging since his surgery.  Patient taking oxycodone and Flexeril that was prescribed after the surgery.  Patient states that prior to surgery he drank 5 22 ounce beers nightly on average.  Since that time son states he has not had anything alcoholic to drink.  Patient states he did drink the night after his return from surgery, but not since then.  Patient eats he is compliant with his other medications but unsure of which medications he is taking without being able to look at the bottle.        Past Medical History:  Diagnosis Date  . Acute midline low back pain with bilateral sciatica 05/16/2020  . Anxiety   . Cancer of transverse colon ([place]) 09/17/2016   surgery done  . Colon cancer [facility]) 1998   surgery and chemo  . Complication of anesthesia    not fully under for polyp removal at [place] 2017  . Depression   . Emphysema of lung ([place])   . Family hx of colon cancer   . GERD (gastroesophageal reflux disease)    none recent  . Headache   . Hyperlipidemia   . Hypertension   . Vitamin D deficiency      Patient Active Problem List   Diagnosis Date Noted  . Lumbar radiculopathy 02/02/2021  . Hospital discharge follow-up 11/24/2020  . CAP (community acquired pneumonia) 11/07/2020  . Sepsis ([place]) 11/07/2020  . Hyponatremia 11/07/2020  . Hypokalemia 11/07/2020  . Foraminal stenosis of lumbar region 12/28/2019  . Lumbar spondylolysis 12/28/2019  . Radiculopathy of lumbosacral region 12/05/2019  . Essential hypertension 09/26/2019  . Chronic right-sided low back pain with right-sided sciatica 09/26/2019  . Recurrent right inguinal hernia 06/26/2019  . Hypertriglyceridemia 11/24/2018  . Anxiety 02/16/2018  . Elevated CEA   . [name]   . Cancer of transverse colon ([place]) 09/17/2016  . S/P colonoscopic polypectomy   . Hematochezia   . Depression 06/01/2016  . Alcohol abuse, in remission 06/01/2016  . Tobacco abuse 06/01/2016  . Vitamin D deficiency 10/17/2015  . Hyperlipidemia 10/17/2015  . History of malignant neoplasm of colon without staging 09/22/2010    Past Surgical History:  Procedure Laterality Date  . APPENDECTOMY    . colon resectomy  1998   2 degree herida  . COLONOSCOPY    . ESOPHAGOGASTRODUODENOSCOPY (EGD) WITH PROPOFOL N/A 05/19/2017   Procedure: ESOPHAGOGASTRODUODENOSCOPY (EGD) WITH PROPOFOL;  Surgeon: [name], MD;  Location: [facility];  Service: Endoscopy;  Laterality: N/A;  . FLEXIBLE SIGMOIDOSCOPY N/A 05/19/2017   Procedure: FLEXIBLE SIGMOIDOSCOPY;  Surgeon: [name], MD;  Location: [facility];  Service: Endoscopy;  Laterality: N/A;  . INGUINAL HERNIA REPAIR    . LAPAROSCOPIC SUBTOTAL COLECTOMY N/A 09/17/2016   Procedure: DIAGNOSTIC LAPAROSCOPY EXPLORATORY LAPAROTOMY SUBTOTAL COLECTOMY PROCTOSCOPY;  Surgeon: [name], MD;  Location: [facility];  Service: General;  Laterality: N/A;  . polyp removal at [place]  2017  . PROCTOSCOPY  09/17/2016   Procedure: PROCTOSCOPY;  Surgeon: [name], MD;  Location: [facility];  Service:  General;;  . TRANSFORAMINAL LUMBAR INTERBODY FUSION W/ MIS 1 LEVEL Right 02/02/2021   Procedure: Right Lumbar four -lumbar five minimally invasive transforaminal lumbar interbody fusion;  Surgeon: Judith Part, MD;  Location: [place];  Service: Neurosurgery;  Laterality: Right;  . UPPER GI ENDOSCOPY         Family History  Problem Relation Age of Onset  . Colon cancer Father        pt thinks he was in his 72's when dx  . Esophageal cancer Neg Hx   . Rectal cancer Neg Hx   . Stomach cancer Neg Hx   . Prostate cancer Neg Hx     Social History   Tobacco Use  . Smoking status: Former Smoker    Packs/day: 1.00    Years: 23.00    Pack years: 23.00    Types: Cigarettes    Quit date: 10/07/2020    Years since quitting: 0.3  . Smokeless tobacco: Never Used  Vaping Use  . Vaping Use: Never used  Substance Use Topics  . Alcohol use: Yes    Alcohol/week: 24.0 standard drinks    Types: 24 Cans of beer per week  . Drug use: No    Home Medications Prior to Admission medications   Medication Sig Start Date End Date Taking? Authorizing Provider  atorvastatin (LIPITOR) 40 MG tablet Take 1 tablet (40 mg total) by mouth daily. 03/21/20   Baruch Gouty, FNP  chlorthalidone (HYGROTON) 25 MG tablet Take 1 tablet (25 mg total) by mouth daily. 03/21/20   Baruch Gouty, FNP  cyclobenzaprine (FLEXERIL) 10 MG tablet Take 1 tablet (10 mg total) by mouth 3 (three) times daily as needed for muscle spasms. 02/03/21   Judith Part, MD  gabapentin (NEURONTIN) 300 MG capsule 1 at bedtime for1 week then 2  The next  week then 3 the next week then 4 daily. Patient taking differently: Take 300 mg by mouth 3 (three) times daily as needed (pain). 10/27/20   Claretta Fraise, MD  gemfibrozil (LOPID) 600 MG tablet TAKE  (1)  TABLET TWICE A DAY WITH MEALS (BREAKFAST AND SUPPER) Patient taking differently: Take 600 mg by mouth 2 (two) times daily before a meal. TAKE  (1)  TABLET TWICE A DAY WITH MEALS  (BREAKFAST AND SUPPER) 03/21/20   Rakes, Connye Burkitt, FNP  loratadine (CLARITIN) 10 MG tablet Take 1 tablet (10 mg total) by mouth daily. 11/12/20   Eugenie Filler, MD  Multiple Vitamin (MULTIVITAMIN WITH MINERALS) TABS tablet Take 1 tablet by mouth daily. 11/12/20   Eugenie Filler, MD  nicotine (NICODERM CQ - DOSED IN MG/24 HOURS) 21 mg/24hr patch Place 1 patch (21 mg total) onto the skin daily. Patient not taking: No sig reported 11/12/20   Eugenie Filler, MD  Omega-3 Fatty Acids (FISH OIL) 1200 MG CAPS Take 1,200 mg by mouth daily.    [provider]  oxyCODONE (OXY IR/ROXICODONE) 5 MG immediate release tablet Take 1 tablet (5 mg total) by mouth every 4 (four) hours as needed for moderate pain ((  score 4 to 6)). 02/03/21   Judith Part, MD  pantoprazole (PROTONIX) 40 MG tablet Take 1 tablet (40 mg total) by mouth daily. 01/26/21   Claretta Fraise, MD  potassium chloride SA (KLOR-CON) 20 MEQ tablet TAKE  (1)  TABLET  THREE TIMES DAILY. Patient taking differently: Take 20-40 mEq by mouth See admin instructions. Take 40 meq in the morning and 20 meq in the evening 11/24/20   Ivy Lynn, NP  QUEtiapine (SEROQUEL) 100 MG tablet Take 100 mg by mouth at bedtime.    [provider]  thiamine 100 MG tablet Take 1 tablet (100 mg total) by mouth daily. 11/12/20   Eugenie Filler, MD    Allergies    Adhesive [tape]  Review of Systems   Review of Systems  All other systems reviewed and are negative.   Physical Exam Updated Vital Signs BP (!) 169/86   Pulse 97   Temp 99.9 F (37.7 C) (Oral)   Resp 19   Ht 5\' 10"  (1.778 m)   Wt 97.5 kg   SpO2 90%   BMI 30.85 kg/m   Physical Exam Constitutional:      General: He is not in acute distress.    Appearance: He is normal weight.  HENT:     Head: Normocephalic.     Nose: Nose normal. No congestion or rhinorrhea.     Comments: Spider veins present on the nose    Mouth/Throat:     Mouth: Mucous membranes are  moist.     Pharynx: Oropharynx is clear. No oropharyngeal exudate.  Eyes:     Extraocular Movements: Extraocular movements intact.     Conjunctiva/sclera: Conjunctivae normal.     Pupils: Pupils are equal, round, and reactive to light.  Cardiovascular:     Rate and Rhythm: Normal rate and regular rhythm.     Pulses: Normal pulses.     Heart sounds: No murmur heard.   Pulmonary:     Effort: Pulmonary effort is normal. No respiratory distress.     Breath sounds: Normal breath sounds.  Abdominal:     General: Bowel sounds are normal. There is distension.     Tenderness: There is abdominal tenderness.     Comments: Diffuse tenderness, in the left lower and right lower quadrant  Musculoskeletal:        General: No swelling or tenderness. Normal range of motion.     Cervical back: Normal range of motion. No rigidity or tenderness.     Right lower leg: No edema.     Left lower leg: No edema.  Skin:    General: Skin is warm and dry.  Neurological:     General: No focal deficit present.     Mental Status: He is alert and oriented to person, place, and time.     Cranial Nerves: No cranial nerve deficit.     Sensory: No sensory deficit.     Motor: No weakness.  Psychiatric:     Comments: Disoriented to time.  Appeared at multiple times to be reaching out towards something with his hands.  Lethargic.  Keeps eyes closed throughout most of the encounter but remains awake.     ED Results / Procedures / Treatments   Labs (all labs ordered are listed, but only abnormal results are displayed) Labs Reviewed  COMPREHENSIVE METABOLIC PANEL - Abnormal; Notable for the following components:      Result Value   Sodium 121 (*)    Potassium 3.0 (*)  Chloride 75 (*)    CO2 34 (*)    Glucose, Bld 113 (*)    Total Protein 6.4 (*)    Albumin 2.8 (*)    AST 330 (*)    ALT 138 (*)    Total Bilirubin 1.8 (*)    All other components within normal limits  CBC - Abnormal; Notable for the  following components:   RBC 3.66 (*)    Hemoglobin 12.7 (*)    HCT 34.9 (*)    MCH 34.7 (*)    MCHC 36.4 (*)    All other components within normal limits  URINALYSIS, ROUTINE W REFLEX MICROSCOPIC - Abnormal; Notable for the following components:   Hgb urine dipstick SMALL (*)    All other components within normal limits  AMMONIA - Abnormal; Notable for the following components:   Ammonia 39 (*)    All other components within normal limits  RESP PANEL BY RT-PCR (FLU A&B, COVID) ARPGX2  CULTURE, BLOOD (ROUTINE X 2)  CULTURE, BLOOD (ROUTINE X 2)  LACTIC ACID, PLASMA  SODIUM, URINE, RANDOM  PROTIME-INR  LACTIC ACID, PLASMA  OSMOLALITY  OSMOLALITY, URINE  CBG MONITORING, ED    EKG None  Radiology CT ABDOMEN PELVIS W CONTRAST  Result Date: 02/06/2021 CLINICAL DATA:  Acute abdominal pain.  Recent lumbar surgery. EXAM: CT ABDOMEN AND PELVIS WITH CONTRAST TECHNIQUE: Multidetector CT imaging of the abdomen and pelvis was performed using the standard protocol following bolus administration of intravenous contrast. CONTRAST:  152mL OMNIPAQUE IOHEXOL 300 MG/ML  SOLN COMPARISON:  06/29/2020 FINDINGS: Lower chest: Minimal linear atelectasis at the lung bases. Hepatobiliary: Normal Pancreas: Normal Spleen: Normal Adrenals/Urinary Tract: Adrenal glands are normal. Kidneys are normal. Bladder is normal. Stomach/Bowel: Stomach is normal. Dilated fluid and air-filled small intestine. Previous colectomy with residual rectosigmoid. I do not see any nondilated loops of small intestine. The differential diagnosis is postoperative ileus versus small-bowel obstruction at the small bowel-colon anastomosis. Fairly large amount of stool in the rectosigmoid. Vascular/Lymphatic: Aortic atherosclerosis. No aneurysm. IVC is normal. No adenopathy. Reproductive: Normal Other: Tiny amount fluid in the peritoneal space. No free air. Bilateral inguinal hernias containing only fat. Musculoskeletal: Previous PLIF L4-5.  See  results of lumbar MRI. IMPRESSION: 1. Previous colectomy with residual rectosigmoid. Dilated fluid and air-filled small intestine all the way to the small bowel-colon anastomosis. The differential diagnosis is postoperative ileus versus small-bowel obstruction at the small bowel-colon anastomosis. No sign of free air. Tiny amount of free fluid in the peritoneal space. 2. Bilateral inguinal hernias containing only fat. 3. Aortic atherosclerosis. 4. Previous PLIF L4-5. See results of lumbar MRI. Aortic Atherosclerosis (ICD10-I70.0). Electronically Signed   By: Nelson Chimes M.D.   On: 02/06/2021 23:21   CT L-SPINE NO CHARGE  Result Date: 02/06/2021 CLINICAL DATA:  Recent lumbar surgery.  Back and abdominal pain. EXAM: CT LUMBAR SPINE WITHOUT CONTRAST TECHNIQUE: Multidetector CT imaging of the lumbar spine was performed without intravenous contrast administration. Multiplanar CT image reconstructions were also generated. COMPARISON:  Operative images [id number].  Preoperative MRI 11/25/2020. FINDINGS: Segmentation: 5 lumbar type vertebral bodies. Alignment: Normal Vertebrae: Previous PLIF L4-5. See below. No other significant focal bone finding. Paraspinal and other soft tissues: Expected air/gas in posterior soft tissues of the surgical approach. Disc levels: No significant finding at L2-3 or above. L3-4: Broad-based disc herniation. Spinal stenosis at this level that could cause neural compression. Difficult to determine if this represents a change from the preoperative MRI. L4-5: Previous PLIF. Considerable artifact  related to hardware. No evidence of hardware malposition. No unexpected finding. L5-S1: Degeneration of the disc with mild vacuum phenomenon and mild bulging. Mild facet osteoarthritis per no compressive stenosis. IMPRESSION: 1. Previous PLIF L4-5. No unexpected finding. 2. Disc protrusion at L3-4 with spinal stenosis. This could cause neural compression. Is difficult to tell this represents a change in  relation to the preoperative MRI dated 11/25/2020. 3. Expected air/gas in the posterior soft tissues of the surgical approach. Electronically Signed   By: Nelson Chimes M.D.   On: 02/06/2021 23:15    Procedures Procedures   Medications Ordered in ED Medications  0.9 %  sodium chloride infusion (has no administration in time range)  iohexol (OMNIPAQUE) 300 MG/ML solution 100 mL (100 mLs Intravenous Contrast Given 02/06/21 2249)    ED Course  I have reviewed the triage vital signs and the nursing notes.  Pertinent labs & imaging results that were available during my care of the patient were reviewed by me and considered in my medical decision making (see chart for details).    MDM Rules/Calculators/A&P                          Patient brought in by his son today due to concerns for hallucinations since his discharge from recent back surgery on Monday.  Patient appears to be having persistent visual hallucinations and possible auditory hallucinations over the past 4 days.  Patient had elective surgery on 2/21 for L4-5 MIS TLIF and discharged on 2/22.  According to patient and son he is a heavy drinker, drinking 4 to 5 22 ounce beers nightly.  Appears to not have had anything to drink since his hospitalization.  Patient also complained of abdominal pain and distention.  States he has been having bowel movements with no nausea or vomiting during this time but is extremely tender in the left lower and right lower quadrants with a distended abdomen.  BMP, which was normal 8 days ago now shows an AST of 330, ALT of 138, T bili of 1.8, CO2 of 34, sodium of 121, K of 3, chloride of 75.  Wide differential for patient's current apparent episode of delirium which includes: Delirium tremens, hyponatremia, hepatic encephalopathy, reaction to anesthesia.  Obtaining further lab work to help narrow down cause of his delirium.  Despite his hallucinations, CIWA score is still low so it is uncertain how much his  alcohol use is playing a role in his mental status.  Will hold off on Ativan at this time to avoid making patient more somnolent.  If he becomes more agitated can consider starting Ativan.  Obtaining further lab work to work-up the cause of his hyponatremia.  Does not appear to be fluid overloaded or dehydrated on exam, but does have abdominal distention which may represent ascites.  Currently giving normal saline at 100 mL/h.  Possible causes for his abdominal pain include: Distention due to ascites, SBO/ileus in the setting of recent surgery, traumatic complication of surgery.  Obtaining a CT abdomen for further evaluation  Currently awaiting imaging and blood work.  Will likely need admission due to hyponatremia, AMS, acute liver injury, possible alcohol withdrawal.  Signing outpatient to provider on next shift.   Final Clinical Impression(s) / ED Diagnoses Final diagnoses:  Back pain  Hallucinations  Hyponatremia    Rx / DC Orders ED Discharge Orders    None       Benay Pike, MD 02/06/21 2330  Lucrezia Starch, MD 02/06/21 3709    Lucrezia Starch, MD 02/06/21 [phone]

## 2021-02-06 NOTE — ED Notes (Signed)
Pt not in room.

## 2021-02-06 NOTE — ED Notes (Signed)
Pt transported to CT ?

## 2021-02-07 ENCOUNTER — Observation Stay (HOSPITAL_COMMUNITY): Payer: 59

## 2021-02-07 ENCOUNTER — Inpatient Hospital Stay (HOSPITAL_COMMUNITY): Payer: 59

## 2021-02-07 ENCOUNTER — Encounter (HOSPITAL_COMMUNITY): Payer: Self-pay | Admitting: Internal Medicine

## 2021-02-07 ENCOUNTER — Emergency Department (HOSPITAL_COMMUNITY): Payer: 59

## 2021-02-07 DIAGNOSIS — Z8249 Family history of ischemic heart disease and other diseases of the circulatory system: Secondary | ICD-10-CM | POA: Diagnosis not present

## 2021-02-07 DIAGNOSIS — E876 Hypokalemia: Secondary | ICD-10-CM | POA: Diagnosis present

## 2021-02-07 DIAGNOSIS — I10 Essential (primary) hypertension: Secondary | ICD-10-CM | POA: Diagnosis present

## 2021-02-07 DIAGNOSIS — Z981 Arthrodesis status: Secondary | ICD-10-CM | POA: Diagnosis not present

## 2021-02-07 DIAGNOSIS — K567 Ileus, unspecified: Secondary | ICD-10-CM | POA: Diagnosis present

## 2021-02-07 DIAGNOSIS — R7989 Other specified abnormal findings of blood chemistry: Secondary | ICD-10-CM | POA: Diagnosis not present

## 2021-02-07 DIAGNOSIS — E785 Hyperlipidemia, unspecified: Secondary | ICD-10-CM | POA: Diagnosis present

## 2021-02-07 DIAGNOSIS — E871 Hypo-osmolality and hyponatremia: Secondary | ICD-10-CM

## 2021-02-07 DIAGNOSIS — G9341 Metabolic encephalopathy: Secondary | ICD-10-CM

## 2021-02-07 DIAGNOSIS — Z85038 Personal history of other malignant neoplasm of large intestine: Secondary | ICD-10-CM | POA: Diagnosis not present

## 2021-02-07 DIAGNOSIS — J439 Emphysema, unspecified: Secondary | ICD-10-CM | POA: Diagnosis present

## 2021-02-07 DIAGNOSIS — R410 Disorientation, unspecified: Secondary | ICD-10-CM | POA: Diagnosis present

## 2021-02-07 DIAGNOSIS — E162 Hypoglycemia, unspecified: Secondary | ICD-10-CM | POA: Diagnosis not present

## 2021-02-07 DIAGNOSIS — Z9049 Acquired absence of other specified parts of digestive tract: Secondary | ICD-10-CM | POA: Diagnosis not present

## 2021-02-07 DIAGNOSIS — K7010 Alcoholic hepatitis without ascites: Secondary | ICD-10-CM | POA: Diagnosis present

## 2021-02-07 DIAGNOSIS — F10231 Alcohol dependence with withdrawal delirium: Principal | ICD-10-CM

## 2021-02-07 DIAGNOSIS — E7220 Disorder of urea cycle metabolism, unspecified: Secondary | ICD-10-CM | POA: Diagnosis present

## 2021-02-07 DIAGNOSIS — Z1509 Genetic susceptibility to other malignant neoplasm: Secondary | ICD-10-CM | POA: Diagnosis not present

## 2021-02-07 DIAGNOSIS — Z800 Family history of malignant neoplasm of digestive organs: Secondary | ICD-10-CM | POA: Diagnosis not present

## 2021-02-07 DIAGNOSIS — Z20822 Contact with and (suspected) exposure to covid-19: Secondary | ICD-10-CM | POA: Diagnosis present

## 2021-02-07 DIAGNOSIS — Z87891 Personal history of nicotine dependence: Secondary | ICD-10-CM | POA: Diagnosis not present

## 2021-02-07 DIAGNOSIS — E861 Hypovolemia: Secondary | ICD-10-CM | POA: Diagnosis present

## 2021-02-07 DIAGNOSIS — K219 Gastro-esophageal reflux disease without esophagitis: Secondary | ICD-10-CM | POA: Diagnosis present

## 2021-02-07 DIAGNOSIS — K529 Noninfective gastroenteritis and colitis, unspecified: Secondary | ICD-10-CM | POA: Diagnosis present

## 2021-02-07 DIAGNOSIS — R339 Retention of urine, unspecified: Secondary | ICD-10-CM | POA: Diagnosis present

## 2021-02-07 DIAGNOSIS — R443 Hallucinations, unspecified: Secondary | ICD-10-CM | POA: Diagnosis present

## 2021-02-07 DIAGNOSIS — C184 Malignant neoplasm of transverse colon: Secondary | ICD-10-CM | POA: Diagnosis not present

## 2021-02-07 LAB — BASIC METABOLIC PANEL
Anion gap: 12 (ref 5–15)
Anion gap: 11 (ref 5–15)
Anion gap: 11 (ref 5–15)
Anion gap: 14 (ref 5–15)
BUN: 12 mg/dL (ref 6–20)
BUN: 11 mg/dL (ref 6–20)
BUN: 13 mg/dL (ref 6–20)
BUN: 12 mg/dL (ref 6–20)
CO2: 29 mmol/L (ref 22–32)
CO2: 30 mmol/L (ref 22–32)
CO2: 29 mmol/L (ref 22–32)
CO2: 28 mmol/L (ref 22–32)
Calcium: 8.1 mg/dL — ABNORMAL LOW (ref 8.9–10.3)
Calcium: 8.2 mg/dL — ABNORMAL LOW (ref 8.9–10.3)
Calcium: 8.1 mg/dL — ABNORMAL LOW (ref 8.9–10.3)
Calcium: 8.4 mg/dL — ABNORMAL LOW (ref 8.9–10.3)
Chloride: 84 mmol/L — ABNORMAL LOW (ref 98–111)
Chloride: 84 mmol/L — ABNORMAL LOW (ref 98–111)
Chloride: 86 mmol/L — ABNORMAL LOW (ref 98–111)
Chloride: 85 mmol/L — ABNORMAL LOW (ref 98–111)
Creatinine, Ser: 0.95 mg/dL (ref 0.61–1.24)
Creatinine, Ser: 0.94 mg/dL (ref 0.61–1.24)
Creatinine, Ser: 0.95 mg/dL (ref 0.61–1.24)
Creatinine, Ser: 0.89 mg/dL (ref 0.61–1.24)
GFR, Estimated: >60 mL/min (ref >60)
GFR, Estimated: >60 mL/min (ref >60)
GFR, Estimated: >60 mL/min (ref >60)
GFR, Estimated: >60 mL/min (ref >60)
Glucose, Bld: 118 mg/dL — ABNORMAL HIGH (ref 70–99)
Glucose, Bld: 106 mg/dL — ABNORMAL HIGH (ref 70–99)
Glucose, Bld: 104 mg/dL — ABNORMAL HIGH (ref 70–99)
Glucose, Bld: 92 mg/dL (ref 70–99)
Potassium: 2.9 mmol/L — ABNORMAL LOW (ref 3.5–5.1)
Potassium: 3.1 mmol/L — ABNORMAL LOW (ref 3.5–5.1)
Potassium: 3.9 mmol/L (ref 3.5–5.1)
Potassium: 3 mmol/L — ABNORMAL LOW (ref 3.5–5.1)
Sodium: 125 mmol/L — ABNORMAL LOW (ref 135–145)
Sodium: 125 mmol/L — ABNORMAL LOW (ref 135–145)
Sodium: 126 mmol/L — ABNORMAL LOW (ref 135–145)
Sodium: 127 mmol/L — ABNORMAL LOW (ref 135–145)

## 2021-02-07 LAB — MAGNESIUM
Magnesium: 2.4 mg/dL (ref 1.7–2.4)
Magnesium: 2.2 mg/dL (ref 1.7–2.4)

## 2021-02-07 LAB — CORTISOL: Cortisol, Plasma: 13.8 ug/dL

## 2021-02-07 LAB — HEPATIC FUNCTION PANEL
ALT: 122 U/L — ABNORMAL HIGH (ref 0–44)
AST: 260 U/L — ABNORMAL HIGH (ref 15–41)
Albumin: 2.4 g/dL — ABNORMAL LOW (ref 3.5–5.0)
Alkaline Phosphatase: 77 U/L (ref 38–126)
Bilirubin, Direct: 0.8 mg/dL — ABNORMAL HIGH (ref 0.0–0.2)
Indirect Bilirubin: 1.3 mg/dL — ABNORMAL HIGH (ref 0.3–0.9)
Total Bilirubin: 2.1 mg/dL — ABNORMAL HIGH (ref 0.3–1.2)
Total Protein: 5.6 g/dL — ABNORMAL LOW (ref 6.5–8.1)

## 2021-02-07 LAB — CBC
HCT: 32.5 % — ABNORMAL LOW (ref 39.0–52.0)
Hemoglobin: 11.6 g/dL — ABNORMAL LOW (ref 13.0–17.0)
MCH: 34.2 pg — ABNORMAL HIGH (ref 26.0–34.0)
MCHC: 35.7 g/dL (ref 30.0–36.0)
MCV: 95.9 fL (ref 80.0–100.0)
Platelets: 156 10*3/uL (ref 150–400)
RBC: 3.39 MIL/uL — ABNORMAL LOW (ref 4.22–5.81)
RDW: 13.4 % (ref 11.5–15.5)
WBC: 3.7 10*3/uL — ABNORMAL LOW (ref 4.0–10.5)
nRBC: 0 % (ref 0.0–0.2)

## 2021-02-07 LAB — HEPATITIS PANEL, ACUTE
HCV Ab: NONREACTIVE
Hep A IgM: NONREACTIVE
Hep B C IgM: NONREACTIVE
Hepatitis B Surface Ag: NONREACTIVE

## 2021-02-07 LAB — TROPONIN I (HIGH SENSITIVITY): Troponin I (High Sensitivity): 13 ng/L (ref <18)

## 2021-02-07 LAB — GLUCOSE, CAPILLARY
Glucose-Capillary: 109 mg/dL — ABNORMAL HIGH (ref 70–99)
Glucose-Capillary: 86 mg/dL (ref 70–99)
Glucose-Capillary: 94 mg/dL (ref 70–99)
Glucose-Capillary: 79 mg/dL (ref 70–99)

## 2021-02-07 LAB — ACETAMINOPHEN LEVEL: Acetaminophen (Tylenol), Serum: <10 ug/mL — ABNORMAL LOW (ref 10–30)

## 2021-02-07 LAB — RESP PANEL BY RT-PCR (FLU A&B, COVID) ARPGX2
Influenza A by PCR: NEGATIVE
Influenza B by PCR: NEGATIVE
SARS Coronavirus 2 by RT PCR: NEGATIVE

## 2021-02-07 LAB — OSMOLALITY: Osmolality: 259 mOsm/kg — ABNORMAL LOW (ref 275–295)

## 2021-02-07 LAB — LACTIC ACID, PLASMA: Lactic Acid, Venous: 1.3 mmol/L (ref 0.5–1.9)

## 2021-02-07 LAB — TSH: TSH: 0.649 u[IU]/mL (ref 0.350–4.500)

## 2021-02-07 LAB — LIPASE, BLOOD: Lipase: 26 U/L (ref 11–51)

## 2021-02-07 MED ORDER — SODIUM CHLORIDE 0.9 % IV BOLUS
1000.0000 mL | Freq: Once | INTRAVENOUS | Status: AC
  Administered 2021-02-07: 1000 mL via INTRAVENOUS

## 2021-02-07 MED ORDER — LORAZEPAM 1 MG PO TABS: 0.0000 mg | ORAL_TABLET | Freq: Two times a day (BID) | ORAL | Status: DC

## 2021-02-07 MED ORDER — POTASSIUM CHLORIDE CRYS ER 20 MEQ PO TBCR
40.0000 meq | EXTENDED_RELEASE_TABLET | Freq: Once | ORAL | Status: AC
  Administered 2021-02-07: 40 meq via ORAL
  Filled 2021-02-07: qty 2

## 2021-02-07 MED ORDER — SODIUM CHLORIDE 0.9 % IV SOLN: INTRAVENOUS | Status: DC

## 2021-02-07 MED ORDER — FOLIC ACID 1 MG PO TABS
1.0000 mg | ORAL_TABLET | Freq: Every day | ORAL | Status: DC
  Administered 2021-02-09 – 2021-02-12 (×4): 1 mg via ORAL
  Filled 2021-02-07 (×5): qty 1

## 2021-02-07 MED ORDER — THIAMINE HCL 100 MG PO TABS: 100.0000 mg | ORAL_TABLET | Freq: Every day | ORAL | Status: DC

## 2021-02-07 MED ORDER — THIAMINE HCL 100 MG PO TABS
100.0000 mg | ORAL_TABLET | Freq: Every day | ORAL | Status: DC
  Administered 2021-02-09: 100 mg via ORAL
  Filled 2021-02-07: qty 1

## 2021-02-07 MED ORDER — LORAZEPAM 2 MG/ML IJ SOLN
0.0000 mg | Freq: Four times a day (QID) | INTRAMUSCULAR | Status: DC
  Administered 2021-02-07: 2 mg via INTRAVENOUS
  Filled 2021-02-07: qty 1

## 2021-02-07 MED ORDER — LORAZEPAM 1 MG PO TABS: 1.0000 mg | ORAL_TABLET | ORAL | Status: AC | PRN

## 2021-02-07 MED ORDER — LORAZEPAM 2 MG/ML IJ SOLN: 0.0000 mg | Freq: Two times a day (BID) | INTRAMUSCULAR | Status: DC

## 2021-02-07 MED ORDER — CHLORHEXIDINE GLUCONATE CLOTH 2 % EX PADS
6.0000 | MEDICATED_PAD | Freq: Every day | CUTANEOUS | Status: DC
  Administered 2021-02-07 – 2021-02-10 (×4): 6 via TOPICAL

## 2021-02-07 MED ORDER — DEXMEDETOMIDINE HCL IN NACL 400 MCG/100ML IV SOLN
0.2000 ug/kg/h | INTRAVENOUS | Status: DC
  Administered 2021-02-07: 0.2 ug/kg/h via INTRAVENOUS
  Filled 2021-02-07 (×2): qty 100

## 2021-02-07 MED ORDER — LABETALOL HCL 5 MG/ML IV SOLN: 10.0000 mg | INTRAVENOUS | Status: DC | PRN

## 2021-02-07 MED ORDER — LORAZEPAM 2 MG/ML IJ SOLN
1.0000 mg | INTRAMUSCULAR | Status: AC | PRN
  Administered 2021-02-07 (×3): 4 mg via INTRAVENOUS
  Administered 2021-02-07: 2 mg via INTRAVENOUS
  Administered 2021-02-07 (×2): 4 mg via INTRAVENOUS
  Administered 2021-02-08: 2 mg via INTRAVENOUS
  Filled 2021-02-07: qty 1
  Filled 2021-02-07: qty 2
  Filled 2021-02-07: qty 1
  Filled 2021-02-07: qty 2
  Filled 2021-02-07: qty 1
  Filled 2021-02-07 (×3): qty 2

## 2021-02-07 MED ORDER — LORAZEPAM 1 MG PO TABS
0.0000 mg | ORAL_TABLET | Freq: Four times a day (QID) | ORAL | Status: DC
  Administered 2021-02-07: 1 mg via ORAL
  Filled 2021-02-07: qty 1

## 2021-02-07 MED ORDER — SODIUM CHLORIDE 0.9 % IV SOLN
INTRAVENOUS | Status: DC
  Filled 2021-02-07 (×5): qty 1000

## 2021-02-07 MED ORDER — THIAMINE HCL 100 MG/ML IJ SOLN: 100.0000 mg | Freq: Every day | INTRAMUSCULAR | Status: DC

## 2021-02-07 MED ORDER — POTASSIUM CHLORIDE 10 MEQ/100ML IV SOLN
10.0000 meq | INTRAVENOUS | Status: AC
  Administered 2021-02-07 (×4): 10 meq via INTRAVENOUS
  Filled 2021-02-07 (×4): qty 100

## 2021-02-07 MED ORDER — THIAMINE HCL 100 MG/ML IJ SOLN
100.0000 mg | Freq: Every day | INTRAMUSCULAR | Status: DC
  Administered 2021-02-07 – 2021-02-08 (×2): 100 mg via INTRAVENOUS
  Filled 2021-02-07 (×3): qty 2

## 2021-02-07 MED ORDER — ADULT MULTIVITAMIN W/MINERALS CH
1.0000 | ORAL_TABLET | Freq: Every day | ORAL | Status: DC
  Filled 2021-02-07: qty 1

## 2021-02-07 MED ORDER — LORAZEPAM 2 MG/ML IJ SOLN
0.0000 mg | INTRAMUSCULAR | Status: AC
  Administered 2021-02-07: 3 mg via INTRAVENOUS
  Administered 2021-02-07 (×2): 2 mg via INTRAVENOUS
  Administered 2021-02-07: 4 mg via INTRAVENOUS
  Administered 2021-02-08 – 2021-02-09 (×2): 2 mg via INTRAVENOUS
  Filled 2021-02-07 (×4): qty 1
  Filled 2021-02-07: qty 2

## 2021-02-07 MED ORDER — LORAZEPAM 2 MG/ML IJ SOLN
0.0000 mg | Freq: Three times a day (TID) | INTRAMUSCULAR | Status: AC
  Administered 2021-02-10: 1 mg via INTRAVENOUS
  Filled 2021-02-07 (×2): qty 1

## 2021-02-07 NOTE — ED Notes (Signed)
Pt standing up moving around, attempted to redirect pt to bed, pt adamant he use restroom prior to CT scan.

## 2021-02-07 NOTE — Consult Note (Signed)
NAME:  Charles Santos, MRN:  [id number], DOB:  1971-11-13, LOS: 0 ADMISSION DATE:  02/06/2021, CONSULTATION DATE:  02/07/21 REFERRING MD:  Lupita Leash - trh, CHIEF COMPLAINT:  Agitated delirium  Brief History:  50 yo admitted 2/25 with delirium, hallucinations -- suspected to be driven largely by etoh dts, also w acute hyponatremia.  CCM consult 2/26 for possible precedex  History of Present Illness:  50 yo M PMH daily EtOH use, spinal stenosis/lumbar radiculopathy s/p L4-5 MIS TLIF on 02/02/21 (discharged 9/32, uncomplicated post-op course), who presented to ED 2/25 with delirium, hallucinations. Family believes last EtOH use 01/31/21. In ED, Labs significant for Na 121, K 3, Cl 75, glu 113, ammonia 39. CT H without acute abnormality. Admitted to [facility], started on CIWA protocol for suspected dts, and NS for hyponatremia.   2/26 AM - pt with rising CIWA scores and worsening agitation. Na has increased to 125, K has decreased to 2.9.  CCM consulted for consideration of precedex in setting of severe agitation   Past Medical History:  Spinal stenosis, s/p L4-5 MIS TLIF (02/02/21)  EtOH use HTN Anxiety Depression Colon cancer, s/p subtotal collectomy Emphysema HA HLD GERD  Significant Hospital Events:  2/25 admitted to [facility] for suspected etoh dts, acute hyponatremia 2/26 worse agitation, rising CIWA scores. CCM consulted for possible precedex  Consults:  CCM  Procedures:    Significant Diagnostic Tests:  2/26 CT H no acute intracranial abnormality.   Micro Data:    Antimicrobials:    Interim History / Subjective:  Has received 7mg  ativan this shift   Objective   Blood pressure 119/60, pulse (!) 103, temperature 100.1 F (37.8 C), temperature source Axillary, resp. rate 20, height 5\' 10"  (1.778 m), weight 97.2 kg, SpO2 95 %.        Intake/Output Summary (Last 24 hours) at 02/07/2021 1027 Last data filed at 02/07/2021 1022 Gross per 24 hour  Intake -  Output 1820 ml  Net -1820 ml    Filed Weights   02/06/21 1825 02/07/21 0615  Weight: 97.5 kg 97.2 kg    Examination: General: Ill appearing middle aged M, supine in bed asleep HENT: NCAT. Dry mm. Trachea midline Lungs: Symmetrical chest expansion, even unlabored, snoring respirations Cardiovascular: 2+ radial pulse.  Abdomen: healed midline abdominal scar. Soft, round abdomen, + bowel sounds but infrequent.  Extremities: BUE mittens. No obvious acute joint deformity. No cyanosis  Neuro: asleep-recent ativan administration. Moving extremities spontaneously, not following commands  GU: foley Skin: pale, c/d/w no rash   Resolved Hospital Problem list     Assessment & Plan:   Acute Encephalopathy -suspected to be in setting of ETOH dts  -?metabolic component with acute hyponatremia -hyperammonemia, mild -looks like pt also takes seroquel at home,  P  -cont CIWA. Pt appropriate for SDU at time of PCCM evaluation  -will plan to re-evaluate the patient later today as well  - recommend checking qtc, might be able to consider adding PRN haldol given home seroquel use   Hyponatremia -takes clorthalidone at home, appears hypovolemic on exam  P -correction per primary team, close monitoring -will add sz precautions   -thiazide being held, closer to discharge eval home meds-- may need different reg   Hypokalemia- being corrected Transaminitis-   Acute hep panel pending. Does use etoh regularly ?Ileus on CT - did have BM 2/25   Best practice (evaluated daily)  Diet: NPO Pain/Anxiety/Delirium protocol (if indicated): CIWA VAP protocol (if indicated): na DVT prophylaxis: per primary  GI prophylaxis: na Glucose control: na Mobility: 1:1 sitter  Disposition:Stable to remain in Progressive at this time   Goals of Care:  Last date of multidisciplinary goals of care discussion:-- Family and staff present:  Summary of discussion:  Follow up goals of care discussion due:  Code Status: Full   Labs    CBC: Recent Labs  Lab 02/06/21 1831 02/07/21 0503  WBC 8.8 3.7*  HGB 12.7* 11.6*  HCT 34.9* 32.5*  MCV 95.4 95.9  PLT 175 810    Basic Metabolic Panel: Recent Labs  Lab 02/06/21 1831 02/07/21 0503  NA 121* 125*  K 3.0* 2.9*  CL 75* 84*  CO2 34* 29  GLUCOSE 113* 118*  BUN 10 12  CREATININE 0.90 0.95  CALCIUM 8.9 8.1*  MG 2.4 2.2   GFR: Estimated Creatinine Clearance: 111.2 mL/min (by C-G formula based on SCr of 0.95 mg/dL). Recent Labs  Lab 02/06/21 1831 02/06/21 2229 02/06/21 2321 02/07/21 0503  WBC 8.8  --   --  3.7*  LATICACIDVEN  --  1.6 1.3  --     Liver Function Tests: Recent Labs  Lab 02/06/21 1831 02/07/21 0503  AST 330* 260*  ALT 138* 122*  ALKPHOS 87 77  BILITOT 1.8* 2.1*  PROT 6.4* 5.6*  ALBUMIN 2.8* 2.4*   Recent Labs  Lab 02/07/21 0503  LIPASE 26   Recent Labs  Lab 02/06/21 2229  AMMONIA 39*    ABG No results found for: PHART, PCO2ART, PO2ART, HCO3, TCO2, ACIDBASEDEF, O2SAT   Coagulation Profile: Recent Labs  Lab 02/06/21 2229  INR 1.0    Cardiac Enzymes: No results for input(s): CKTOTAL, CKMB, CKMBINDEX, TROPONINI in the last 168 hours.  HbA1C: Hemoglobin A1C  Date/Time Value Ref Range Status  10/17/2013 10:10 AM 4.6  Final    Comment:    4.2-6.3% normal range   Hgb A1c MFr Bld  Date/Time Value Ref Range Status  09/06/2016 01:30 PM 4.8 4.8 - 5.6 % Final    Comment:    (NOTE)         Pre-diabetes: 5.7 - 6.4         Diabetes: >6.4         Glycemic control for adults with diabetes: <7.0     CBG: Recent Labs  Lab 02/07/21 0715  GLUCAP 109*    Review of Systems:   Unable to obtain, encephalopathic in setting of Etoh dts, recent ativan administration   Past Medical History:  He,  has a past medical history of Acute midline low back pain with bilateral sciatica (05/16/2020), Anxiety, Cancer of transverse colon ([place]) (09/17/2016), Colon cancer ([place]) (1751), Complication of anesthesia, Depression, Emphysema of  lung ([place]), Family hx of colon cancer, GERD (gastroesophageal reflux disease), Headache, Hyperlipidemia, Hypertension, and Vitamin D deficiency.   Surgical History:   Past Surgical History:  Procedure Laterality Date  . APPENDECTOMY    . colon resectomy  1998   2 degree herida  . COLONOSCOPY    . ESOPHAGOGASTRODUODENOSCOPY (EGD) WITH PROPOFOL N/A 05/19/2017   Procedure: ESOPHAGOGASTRODUODENOSCOPY (EGD) WITH PROPOFOL;  Surgeon: [name], MD;  Location: [facility];  Service: Endoscopy;  Laterality: N/A;  . FLEXIBLE SIGMOIDOSCOPY N/A 05/19/2017   Procedure: FLEXIBLE SIGMOIDOSCOPY;  Surgeon: [name], MD;  Location: [facility];  Service: Endoscopy;  Laterality: N/A;  . INGUINAL HERNIA REPAIR    . LAPAROSCOPIC SUBTOTAL COLECTOMY N/A 09/17/2016   Procedure: DIAGNOSTIC LAPAROSCOPY EXPLORATORY LAPAROTOMY SUBTOTAL COLECTOMY PROCTOSCOPY;  Surgeon: [name], MD;  Location: [facility];  Service: General;  Laterality: N/A;  . polyp removal at [place]  2017  . PROCTOSCOPY  09/17/2016   Procedure: PROCTOSCOPY;  Surgeon: [name], MD;  Location: [facility];  Service: General;;  . TRANSFORAMINAL LUMBAR INTERBODY FUSION W/ MIS 1 LEVEL Right 02/02/2021   Procedure: Right Lumbar four -lumbar five minimally invasive transforaminal lumbar interbody fusion;  Surgeon: Judith Part, MD;  Location: [place];  Service: Neurosurgery;  Laterality: Right;  . UPPER GI ENDOSCOPY       Social History:   reports that he quit smoking about 4 months ago. His smoking use included cigarettes. He has a 23.00 pack-year smoking history. He has never used smokeless tobacco. He reports current alcohol use of about 24.0 standard drinks of alcohol per week. He reports that he does not use drugs.   Family History:  His family history includes Colon cancer in his father. There is no history of Esophageal cancer, Rectal cancer, Stomach cancer, or Prostate cancer.   Allergies Allergies  Allergen Reactions   . Adhesive [Tape] Rash and Other (See Comments)    Prefers paper tape     Home Medications  Prior to Admission medications   Medication Sig Start Date End Date Taking? Authorizing Provider  atorvastatin (LIPITOR) 40 MG tablet Take 1 tablet (40 mg total) by mouth daily. 03/21/20   Baruch Gouty, FNP  chlorthalidone (HYGROTON) 25 MG tablet Take 1 tablet (25 mg total) by mouth daily. 03/21/20   Baruch Gouty, FNP  cyclobenzaprine (FLEXERIL) 10 MG tablet Take 1 tablet (10 mg total) by mouth 3 (three) times daily as needed for muscle spasms. 02/03/21   Judith Part, MD  gabapentin (NEURONTIN) 300 MG capsule 1 at bedtime for1 week then 2  The next  week then 3 the next week then 4 daily. Patient taking differently: Take 300 mg by mouth 3 (three) times daily as needed (pain). 10/27/20   Claretta Fraise, MD  gemfibrozil (LOPID) 600 MG tablet TAKE  (1)  TABLET TWICE A DAY WITH MEALS (BREAKFAST AND SUPPER) Patient taking differently: Take 600 mg by mouth 2 (two) times daily before a meal. TAKE  (1)  TABLET TWICE A DAY WITH MEALS (BREAKFAST AND SUPPER) 03/21/20   Rakes, Connye Burkitt, FNP  loratadine (CLARITIN) 10 MG tablet Take 1 tablet (10 mg total) by mouth daily. 11/12/20   Eugenie Filler, MD  Multiple Vitamin (MULTIVITAMIN WITH MINERALS) TABS tablet Take 1 tablet by mouth daily. 11/12/20   Eugenie Filler, MD  nicotine (NICODERM CQ - DOSED IN MG/24 HOURS) 21 mg/24hr patch Place 1 patch (21 mg total) onto the skin daily. Patient not taking: No sig reported 11/12/20   Eugenie Filler, MD  Omega-3 Fatty Acids (FISH OIL) 1200 MG CAPS Take 1,200 mg by mouth daily.    [provider]  oxyCODONE (OXY IR/ROXICODONE) 5 MG immediate release tablet Take 1 tablet (5 mg total) by mouth every 4 (four) hours as needed for moderate pain ((score 4 to 6)). 02/03/21   Judith Part, MD  pantoprazole (PROTONIX) 40 MG tablet Take 1 tablet (40 mg total) by mouth daily. 01/26/21   Claretta Fraise, MD   potassium chloride SA (KLOR-CON) 20 MEQ tablet TAKE  (1)  TABLET  THREE TIMES DAILY. Patient taking differently: Take 20-40 mEq by mouth See admin instructions. Take 40 meq in the morning and 20 meq in the evening 11/24/20   Ivy Lynn, NP  QUEtiapine (  SEROQUEL) 100 MG tablet Take 100 mg by mouth at bedtime.    [provider]  thiamine 100 MG tablet Take 1 tablet (100 mg total) by mouth daily. 11/12/20   Eugenie Filler, MD    CCT: n/a   Eliseo Gum MSN, AGACNP-BC [place] [phone] If no answer, [phone] 02/07/2021, 11:49 AM

## 2021-02-07 NOTE — H&P (Addendum)
History and Physical    Charles Santos YWV:[id number] DOB: 1971-08-06 DOA: 02/06/2021  PCP: Claretta Fraise, MD  Patient coming from: Home.  History obtained from ER physician.  Previous chart.  Unable to reach patient's son.  Chief Complaint: Confusion.  Hallucination.  HPI: Charles Santos is a 50 y.o. male with history of colon cancer status post subtotal colectomy, hypertension, alcohol abuse, emphysema who had recent low back surgery by neurosurgery and discharged home on February 22 was brought to the ER after patient was having increasing confusion and hallucination as per the report.  Patient also was complaining of epigastric discomfort.  Had some nausea vomiting.  Denies any chest pain or shortness of breath.  ED Course: In the ER patient has become progressively more confused hallucinating with CT head showing nothing acute.  Labs are showing acute hyponatremia with sodium dropping from 137 on February 17 it is around 121 and LFTs have also increased from normal about 10 days ago and it is around AST showing 330 ALT 138 and total bili 1.8 with albumin of 2.8 and ammonia 39.  CT abdomen pelvis shows features concerning for ileus and possible small bowel obstruction.  In the ER patient did have a normal bowel movement.  Patient was started on CIWA protocol and also IV fluids for hyponatremia.  Urine sodium is showing less than 10 sodium.  Urine osmolality was 330.  Patient admitted for acute delirium likely multifactorial including alcohol abuse with withdrawal acute hyponatremia could also be related to medications.  Review of Systems: As per HPI, rest all negative.   Past Medical History:  Diagnosis Date  . Acute midline low back pain with bilateral sciatica 05/16/2020  . Anxiety   . Cancer of transverse colon ([place]) 09/17/2016   surgery done  . Colon cancer [facility]) 1998   surgery and chemo  . Complication of anesthesia    not fully under for polyp removal at [place] 2017  .  Depression   . Emphysema of lung ([place])   . Family hx of colon cancer   . GERD (gastroesophageal reflux disease)    none recent  . Headache   . Hyperlipidemia   . Hypertension   . Vitamin D deficiency     Past Surgical History:  Procedure Laterality Date  . APPENDECTOMY    . colon resectomy  1998   2 degree herida  . COLONOSCOPY    . ESOPHAGOGASTRODUODENOSCOPY (EGD) WITH PROPOFOL N/A 05/19/2017   Procedure: ESOPHAGOGASTRODUODENOSCOPY (EGD) WITH PROPOFOL;  Surgeon: [name], MD;  Location: [facility];  Service: Endoscopy;  Laterality: N/A;  . FLEXIBLE SIGMOIDOSCOPY N/A 05/19/2017   Procedure: FLEXIBLE SIGMOIDOSCOPY;  Surgeon: [name], MD;  Location: [facility];  Service: Endoscopy;  Laterality: N/A;  . INGUINAL HERNIA REPAIR    . LAPAROSCOPIC SUBTOTAL COLECTOMY N/A 09/17/2016   Procedure: DIAGNOSTIC LAPAROSCOPY EXPLORATORY LAPAROTOMY SUBTOTAL COLECTOMY PROCTOSCOPY;  Surgeon: [name], MD;  Location: [facility];  Service: General;  Laterality: N/A;  . polyp removal at [facility]  2017  . PROCTOSCOPY  09/17/2016   Procedure: PROCTOSCOPY;  Surgeon: [name], MD;  Location: [facility];  Service: General;;  . TRANSFORAMINAL LUMBAR INTERBODY FUSION W/ MIS 1 LEVEL Right 02/02/2021   Procedure: Right Lumbar four -lumbar five minimally invasive transforaminal lumbar interbody fusion;  Surgeon: Judith Part, MD;  Location: [place];  Service: Neurosurgery;  Laterality: Right;  . UPPER GI ENDOSCOPY       reports that he quit  smoking about 4 months ago. His smoking use included cigarettes. He has a 23.00 pack-year smoking history. He has never used smokeless tobacco. He reports current alcohol use of about 24.0 standard drinks of alcohol per week. He reports that he does not use drugs.  Allergies  Allergen Reactions  . Adhesive [Tape] Rash and Other (See Comments)    Prefers paper tape    Family History  Problem Relation Age of Onset  . Colon cancer Father         pt thinks he was in his 67's when dx  . Esophageal cancer Neg Hx   . Rectal cancer Neg Hx   . Stomach cancer Neg Hx   . Prostate cancer Neg Hx     Prior to Admission medications   Medication Sig Start Date End Date Taking? Authorizing Provider  atorvastatin (LIPITOR) 40 MG tablet Take 1 tablet (40 mg total) by mouth daily. 03/21/20   Baruch Gouty, FNP  chlorthalidone (HYGROTON) 25 MG tablet Take 1 tablet (25 mg total) by mouth daily. 03/21/20   Baruch Gouty, FNP  cyclobenzaprine (FLEXERIL) 10 MG tablet Take 1 tablet (10 mg total) by mouth 3 (three) times daily as needed for muscle spasms. 02/03/21   Judith Part, MD  gabapentin (NEURONTIN) 300 MG capsule 1 at bedtime for1 week then 2  The next  week then 3 the next week then 4 daily. Patient taking differently: Take 300 mg by mouth 3 (three) times daily as needed (pain). 10/27/20   Claretta Fraise, MD  gemfibrozil (LOPID) 600 MG tablet TAKE  (1)  TABLET TWICE A DAY WITH MEALS (BREAKFAST AND SUPPER) Patient taking differently: Take 600 mg by mouth 2 (two) times daily before a meal. TAKE  (1)  TABLET TWICE A DAY WITH MEALS (BREAKFAST AND SUPPER) 03/21/20   Rakes, Connye Burkitt, FNP  loratadine (CLARITIN) 10 MG tablet Take 1 tablet (10 mg total) by mouth daily. 11/12/20   Eugenie Filler, MD  Multiple Vitamin (MULTIVITAMIN WITH MINERALS) TABS tablet Take 1 tablet by mouth daily. 11/12/20   Eugenie Filler, MD  nicotine (NICODERM CQ - DOSED IN MG/24 HOURS) 21 mg/24hr patch Place 1 patch (21 mg total) onto the skin daily. Patient not taking: No sig reported 11/12/20   Eugenie Filler, MD  Omega-3 Fatty Acids (FISH OIL) 1200 MG CAPS Take 1,200 mg by mouth daily.    [provider]  oxyCODONE (OXY IR/ROXICODONE) 5 MG immediate release tablet Take 1 tablet (5 mg total) by mouth every 4 (four) hours as needed for moderate pain ((score 4 to 6)). 02/03/21   Judith Part, MD  pantoprazole (PROTONIX) 40 MG tablet Take 1 tablet (40 mg  total) by mouth daily. 01/26/21   Claretta Fraise, MD  potassium chloride SA (KLOR-CON) 20 MEQ tablet TAKE  (1)  TABLET  THREE TIMES DAILY. Patient taking differently: Take 20-40 mEq by mouth See admin instructions. Take 40 meq in the morning and 20 meq in the evening 11/24/20   Ivy Lynn, NP  QUEtiapine (SEROQUEL) 100 MG tablet Take 100 mg by mouth at bedtime.    [provider]  thiamine 100 MG tablet Take 1 tablet (100 mg total) by mouth daily. 11/12/20   Eugenie Filler, MD    Physical Exam: Constitutional: Moderately built and nourished. Vitals:   02/07/21 0115 02/07/21 0210 02/07/21 0339 02/07/21 0400  BP: 134/87 (!) 152/85 132/71 131/79  Pulse: (!) 44 99 85 99  Resp: (!) 22 (!) 23  (!) 24  Temp:      TempSrc:      SpO2: 99% 93%  95%  Weight:      Height:       Eyes: Anicteric no pallor. ENMT: No discharge from the ears eyes nose or mouth. Neck: No mass felt.  No neck rigidity. Respiratory: No rhonchi or crepitations. Cardiovascular: S1-S2 heard. Abdomen: Distended mild tenderness in the epigastric area. Musculoskeletal: No edema. Skin: Skin around the lumbar area where his operative does not show any active discharge. Neurologic: Alert awake oriented to his name and place has been at times confused hallucinating moving all extremities. Psychiatric: Confused.   Labs on Admission: I have personally reviewed following labs and imaging studies  CBC: Recent Labs  Lab 02/06/21 1831  WBC 8.8  HGB 12.7*  HCT 34.9*  MCV 95.4  PLT 619   Basic Metabolic Panel: Recent Labs  Lab 02/06/21 1831  NA 121*  K 3.0*  CL 75*  CO2 34*  GLUCOSE 113*  BUN 10  CREATININE 0.90  CALCIUM 8.9  MG 2.4   GFR: Estimated Creatinine Clearance: 117.6 mL/min (by C-G formula based on SCr of 0.9 mg/dL). Liver Function Tests: Recent Labs  Lab 02/06/21 1831  AST 330*  ALT 138*  ALKPHOS 87  BILITOT 1.8*  PROT 6.4*  ALBUMIN 2.8*   No results for input(s): LIPASE,  AMYLASE in the last 168 hours. Recent Labs  Lab 02/06/21 2229  AMMONIA 39*   Coagulation Profile: Recent Labs  Lab 02/06/21 2229  INR 1.0   Cardiac Enzymes: No results for input(s): CKTOTAL, CKMB, CKMBINDEX, TROPONINI in the last 168 hours. BNP (last 3 results) No results for input(s): PROBNP in the last 8760 hours. HbA1C: No results for input(s): HGBA1C in the last 72 hours. CBG: No results for input(s): GLUCAP in the last 168 hours. Lipid Profile: No results for input(s): CHOL, HDL, LDLCALC, TRIG, CHOLHDL, LDLDIRECT in the last 72 hours. Thyroid Function Tests: No results for input(s): TSH, T4TOTAL, FREET4, T3FREE, THYROIDAB in the last 72 hours. Anemia Panel: No results for input(s): VITAMINB12, FOLATE, FERRITIN, TIBC, IRON, RETICCTPCT in the last 72 hours. Urine analysis:    Component Value Date/Time   COLORURINE YELLOW 02/06/2021 1831   APPEARANCEUR CLEAR 02/06/2021 1831   LABSPEC 1.011 02/06/2021 1831   PHURINE 6.0 02/06/2021 1831   GLUCOSEU NEGATIVE 02/06/2021 1831   HGBUR SMALL (A) 02/06/2021 1831   BILIRUBINUR NEGATIVE 02/06/2021 1831   KETONESUR NEGATIVE 02/06/2021 1831   PROTEINUR NEGATIVE 02/06/2021 1831   NITRITE NEGATIVE 02/06/2021 1831   LEUKOCYTESUR NEGATIVE 02/06/2021 1831   Sepsis Labs: @LABRCNTIP (procalcitonin:4,lacticidven:4) ) Recent Results (from the past 240 hour(s))  Surgical pcr screen     Status: None   Collection Time: 01/30/21  8:41 AM   Specimen: Nasal Mucosa; Nasal Swab  Result Value Ref Range Status   MRSA, PCR NEGATIVE NEGATIVE Final   Staphylococcus aureus NEGATIVE NEGATIVE Final    Comment: (NOTE) The Xpert SA Assay (FDA approved for NASAL specimens in patients 65 years of age and older), is one component of a comprehensive surveillance program. It is not intended to diagnose infection nor to guide or monitor treatment. Performed at [facility], [place] [address], [place], [place] [address]   SARS CORONAVIRUS 2 (TAT 6-24  HRS) Nasopharyngeal Nasopharyngeal Swab     Status: None   Collection Time: 01/30/21  9:14 AM   Specimen: Nasopharyngeal Swab  Result Value Ref Range  Status   SARS Coronavirus 2 NEGATIVE NEGATIVE Final    Comment: (NOTE) SARS-CoV-2 target nucleic acids are NOT DETECTED.  The SARS-CoV-2 RNA is generally detectable in upper and lower respiratory specimens during the acute phase of infection. Negative results do not preclude SARS-CoV-2 infection, do not rule out co-infections with other pathogens, and should not be used as the sole basis for treatment or other patient management decisions. Negative results must be combined with clinical observations, patient history, and epidemiological information. The expected result is Negative.  Fact Sheet for Patients: SugarRoll.be  Fact Sheet for Healthcare Providers: https://www.woods-mathews.com/  This test is not yet approved or cleared by the [place] FDA and  has been authorized for detection and/or diagnosis of SARS-CoV-2 by FDA under an Emergency Use Authorization (EUA). This EUA will remain  in effect (meaning this test can be used) for the duration of the COVID-19 declaration under Se ction 564(b)(1) of the Act, 21 U.S.C. section 360bbb-3(b)(1), unless the authorization is terminated or revoked sooner.  Performed at [facility], [place] [address] [place], [place] [address]   Resp Panel by RT-PCR (Flu A&B, Covid) Nasopharyngeal Swab     Status: None   Collection Time: 02/06/21  1:30 AM   Specimen: Nasopharyngeal Swab; Nasopharyngeal(NP) swabs in vial transport medium  Result Value Ref Range Status   SARS Coronavirus 2 by RT PCR NEGATIVE NEGATIVE Final    Comment: (NOTE) SARS-CoV-2 target nucleic acids are NOT DETECTED.  The SARS-CoV-2 RNA is generally detectable in upper respiratory specimens during the acute phase of infection. The lowest concentration of SARS-CoV-2 viral copies  this assay can detect is 138 copies/mL. A negative result does not preclude SARS-Cov-2 infection and should not be used as the sole basis for treatment or other patient management decisions. A negative result may occur with  improper specimen collection/handling, submission of specimen other than nasopharyngeal swab, presence of viral mutation(s) within the areas targeted by this assay, and inadequate number of viral copies(<138 copies/mL). A negative result must be combined with clinical observations, patient history, and epidemiological information. The expected result is Negative.  Fact Sheet for Patients:  EntrepreneurPulse.com.au  Fact Sheet for Healthcare Providers:  IncredibleEmployment.be  This test is no t yet approved or cleared by the [place] FDA and  has been authorized for detection and/or diagnosis of SARS-CoV-2 by FDA under an Emergency Use Authorization (EUA). This EUA will remain  in effect (meaning this test can be used) for the duration of the COVID-19 declaration under Section 564(b)(1) of the Act, 21 U.S.C.section 360bbb-3(b)(1), unless the authorization is terminated  or revoked sooner.       Influenza A by PCR NEGATIVE NEGATIVE Final   Influenza B by PCR NEGATIVE NEGATIVE Final    Comment: (NOTE) The Xpert Xpress SARS-CoV-2/FLU/RSV plus assay is intended as an aid in the diagnosis of influenza from Nasopharyngeal swab specimens and should not be used as a sole basis for treatment. Nasal washings and aspirates are unacceptable for Xpert Xpress SARS-CoV-2/FLU/RSV testing.  Fact Sheet for Patients: EntrepreneurPulse.com.au  Fact Sheet for Healthcare Providers: IncredibleEmployment.be  This test is not yet approved or cleared by the [place] FDA and has been authorized for detection and/or diagnosis of SARS-CoV-2 by FDA under an Emergency Use Authorization (EUA). This EUA  will remain in effect (meaning this test can be used) for the duration of the COVID-19 declaration under Section 564(b)(1) of the Act, 21 U.S.C. section 360bbb-3(b)(1), unless the authorization is terminated or  revoked.  Performed at [facility], [place] [address], [place], [place] [address]      Radiological Exams on Admission: CT HEAD WO CONTRAST  Result Date: 02/07/2021 CLINICAL DATA:  Mental status changes of unknown cause. EXAM: CT HEAD WITHOUT CONTRAST TECHNIQUE: Contiguous axial images were obtained from the base of the skull through the vertex without intravenous contrast. COMPARISON:  None. FINDINGS: Brain: The brain shows a normal appearance without evidence of malformation, atrophy, old or acute small or large vessel infarction, mass lesion, hemorrhage, hydrocephalus or extra-axial collection. Vascular: No hyperdense vessel. No evidence of atherosclerotic calcification. Skull: Normal.  No traumatic finding.  No focal bone lesion. Sinuses/Orbits: Sinuses are clear. Orbits appear normal. Mastoids are clear. Other: None significant IMPRESSION: Normal head CT. Electronically Signed   By: Nelson Chimes M.D.   On: 02/07/2021 03:33   CT ABDOMEN PELVIS W CONTRAST  Result Date: 02/06/2021 CLINICAL DATA:  Acute abdominal pain.  Recent lumbar surgery. EXAM: CT ABDOMEN AND PELVIS WITH CONTRAST TECHNIQUE: Multidetector CT imaging of the abdomen and pelvis was performed using the standard protocol following bolus administration of intravenous contrast. CONTRAST:  149mL OMNIPAQUE IOHEXOL 300 MG/ML  SOLN COMPARISON:  06/29/2020 FINDINGS: Lower chest: Minimal linear atelectasis at the lung bases. Hepatobiliary: Normal Pancreas: Normal Spleen: Normal Adrenals/Urinary Tract: Adrenal glands are normal. Kidneys are normal. Bladder is normal. Stomach/Bowel: Stomach is normal. Dilated fluid and air-filled small intestine. Previous colectomy with residual rectosigmoid. I do not see any nondilated loops of  small intestine. The differential diagnosis is postoperative ileus versus small-bowel obstruction at the small bowel-colon anastomosis. Fairly large amount of stool in the rectosigmoid. Vascular/Lymphatic: Aortic atherosclerosis. No aneurysm. IVC is normal. No adenopathy. Reproductive: Normal Other: Tiny amount fluid in the peritoneal space. No free air. Bilateral inguinal hernias containing only fat. Musculoskeletal: Previous PLIF L4-5.  See results of lumbar MRI. IMPRESSION: 1. Previous colectomy with residual rectosigmoid. Dilated fluid and air-filled small intestine all the way to the small bowel-colon anastomosis. The differential diagnosis is postoperative ileus versus small-bowel obstruction at the small bowel-colon anastomosis. No sign of free air. Tiny amount of free fluid in the peritoneal space. 2. Bilateral inguinal hernias containing only fat. 3. Aortic atherosclerosis. 4. Previous PLIF L4-5. See results of lumbar MRI. Aortic Atherosclerosis (ICD10-I70.0). Electronically Signed   By: Nelson Chimes M.D.   On: 02/06/2021 23:21   CT L-SPINE NO CHARGE  Result Date: 02/06/2021 CLINICAL DATA:  Recent lumbar surgery.  Back and abdominal pain. EXAM: CT LUMBAR SPINE WITHOUT CONTRAST TECHNIQUE: Multidetector CT imaging of the lumbar spine was performed without intravenous contrast administration. Multiplanar CT image reconstructions were also generated. COMPARISON:  Operative images [id number].  Preoperative MRI 11/25/2020. FINDINGS: Segmentation: 5 lumbar type vertebral bodies. Alignment: Normal Vertebrae: Previous PLIF L4-5. See below. No other significant focal bone finding. Paraspinal and other soft tissues: Expected air/gas in posterior soft tissues of the surgical approach. Disc levels: No significant finding at L2-3 or above. L3-4: Broad-based disc herniation. Spinal stenosis at this level that could cause neural compression. Difficult to determine if this represents a change from the preoperative MRI.  L4-5: Previous PLIF. Considerable artifact related to hardware. No evidence of hardware malposition. No unexpected finding. L5-S1: Degeneration of the disc with mild vacuum phenomenon and mild bulging. Mild facet osteoarthritis per no compressive stenosis. IMPRESSION: 1. Previous PLIF L4-5. No unexpected finding. 2. Disc protrusion at L3-4 with spinal stenosis. This could cause neural compression. Is difficult to tell this represents a change  in relation to the preoperative MRI dated 11/25/2020. 3. Expected air/gas in the posterior soft tissues of the surgical approach. Electronically Signed   By: Nelson Chimes M.D.   On: 02/06/2021 23:15     Assessment/Plan Principal Problem:   Acute delirium Active Problems:   Cancer of transverse colon (HCC)   [name]   Essential hypertension   Hyponatremia   Hypokalemia   Elevated LFTs    1. Acute delirium likely multifactorial including alcohol withdrawal could be also from acute hyponatremia and also be related to medications not sure if he was taking some of them or not.  Unable to reach patient's son to get no further history at this time.  Patient has been placed on CIWA protocol we will keep patient n.p.o. IV thiamine check thiamine levels and repeat ammonia levels.  If patient gets more delirious may need Precedex.  Blood cultures have been sent no definite signs of any infection chest x-ray is unremarkable.  UA does not show anything acute. 2. Acute hyponatremia -urine sodium was less than 10 presently we will gently hydrate and follow metabolic panel closely.  In addition we will check her TSH and cortisol levels.  Discontinue hydrochlorothiazide. 3. Elevated LFTs with epigastric pain.  CT abdomen pelvis does not show any definite evidence for the cause for the elevated LFTs.  Will check acute hepatitis panel Tylenol levels follow LFTs. 4. Possible ileus versus small bowel obstruction -abdomen is mildly distended.  Mildly tender in the  epigastric area.  Patient did have a bowel movement.  So unlikely to be obstructed at this time.  We will closely monitor. 5. Hypertension since patient is kept n.p.o. we will keep patient on as needed IV labetalol for now. 6. Recent lumbar surgery -CT lumbar spine report reviewed.  Patient is able to walk without any difficulty at this time.  Will notify patient's neurosurgeon. 7. History of colon cancer status post subtotal colectomy.  Covid test is pending.  Since patient has acute delirium with acute hyponatremia elevated LFTs will need close monitoring for any further worsening in inpatient status.   DVT prophylaxis: SCDs for now.  Will monitor LFTs closely. Code Status: Full code. Family Communication: Unable to reach patient's son. Disposition Plan: To be determined. Consults called: None. Admission status: Inpatient.   Rise Patience MD [facility] Pager [phone].  If 7PM-7AM, please contact night-coverage [email or web address] Password [facility]  02/07/2021, 4:31 AM

## 2021-02-07 NOTE — Progress Notes (Signed)
Pt received from ed, confused to place, time ciwa 16, call bell within reach bed alarm on

## 2021-02-07 NOTE — ED Provider Notes (Signed)
Patient signed out to me by Dr. Roslynn Amble.  Patient with recent (Feb 21st) lumbar surgery.  Patient presented with altered mental status.  Patient reportedly has a significant alcohol history.  He has not had anything to drink in a couple of days.  Work-up reveals hyponatremia.  Additionally he had abdominal distention and CT scan was performed.  CT scan with partial small bowel obstruction versus ileus.  Clinically I favor ileus.  Evaluating the patient, however, he does seem altered.  He is oriented x3 but is slow to respond and I believe he is likely exhibiting some element of withdrawal.  Patient noted to be hypertensive and tachycardic.  Placed on CIWA protocol given IV fluids, thiamine.  Vitals are improving.   Orpah Greek, MD 02/07/21 [place] [place]

## 2021-02-07 NOTE — ED Notes (Signed)
Pt remains confused. Repositioned in bed, instructed not to get up.

## 2021-02-07 NOTE — Social Work (Signed)
CSW acknowledges SA resource consult however pt experiencing withdrawal symptoms.

## 2021-02-07 NOTE — ED Notes (Addendum)
Notified MD of patient speaking to "people in the room", holding "a dead squirrel", CIWA repeated. Order obtained for Ativan 2mg  IV.

## 2021-02-07 NOTE — Progress Notes (Signed)
Patient seen and examined personally this am I reviewed the chart, history and physical and admission note, done by admitting physician this morning and agree with the same with following addendum.  Please refer to the morning admission note for more detailed plan of care.  Briefly 50 year old man with history of colon cancer status post subtotal colectomy, alcohol abuse, hypertension, emphysema recent low back surgery by neurosurgery and discharged on 02/03/2021 admitted with increasing confusion, hallucination, epigastric discomfort and also had nausea vomiting.  In the ED he was progressively more confused/hallucinating, CT head no acute finding, blood work showed acute hyponatremia with sodium dropping from 137 on February 17 it is around 121 and LFTs have also increased from normal about 10 days ago and it is around AST showing 330 ALT 138 and total bili 1.8 with albumin of 2.8 and ammonia 39.  CT abdomen pelvis shows features concerning for ileus and possible small bowel obstruction.  In the ER patient did have a normal bowel movement.  Patient was started on CIWA protocol and also IV fluids for hyponatremia.  Urine sodium is showing less than 10 sodium.  Urine osmolality was 330.  Patient admitted for acute delirium likely multifactorial including alcohol abuse with withdrawal acute hyponatremia could also be related to medications  A.m. labs reviewed Sodium improved to 125, potassium low 2.9 AST ALT downtrending total bili 2.1, normal lactic acid,  6 CBC with mild leukopenia, A.m. cortisol 13.8, urine sodium less than 10 Osm 330 UA WBC 0-5.  Seen this morning. Patient was very agitated trying to come out of the bed needing 2-3 people to keep him on the bed. He is wanting to pee able to tell me name but unable to answer any questions Is moving all his extremities well. No neck stiffness. On exam agitated oriented x1, extensive bruise on back from recent surgery with surgical site healing see  picture Abdomen firm, supra to mid abdomen is distended and tender Bowel sounds present   issues Acute delirium suspecting acute severe alcohol withdrawal history of alcohol abuse-called son and confirmed he drinks 6 of 22 ounce beers daily last drink on 19th and he started to have symptoms after his discharge on 22-23, was "jumping in sleep" per son.also contributed by his recent back surgery hospitalization anesthesia, urine retention, metabolic derangement with hyponatremia. No evidence of UTI or pneumonia in CXR.On CIWA Ativan score at 25 giving additional 3 mg Ativan this morning, consulted PCCM given his extreme agitation, added one-to-one. Keep on fall precautions, NPO.  Acute hyponatremia, with low urine sodium suspecting volume depletion, rule out other etiology.  TSH 0.6, cortisol 13.8.  Sodium improving with IV fluid.  Monitor sodium serially  Recent Labs  Lab 02/06/21 1831 02/07/21 0503  NA 121* 125*   Acute urine retention bladder scan more than 1000 mL, ordered Foley catheter.Will need to be careful as he may pull it out.  Hypokalemia:we will replete with IV potassium and change IV fluids.  Abnormal LFTs/transaminitis with epigastric pain: CT abdomen pelvis reviewed acute viral pending, Tylenol level <10.  Possible ileus versus SBO per CT imaging but patient had bowel movement in the ED.bowel sounds present abdomen distended but has bladder distention it seems.X-ray abdomen ordered.  It could be in the setting of his postoperative status.Serial abdominal exam and xray, NPO, IVF, and may need to consider CCS consult Had BM again on the floor.  Hypertension-hold po med.  Recent lumbar surgery:CT lumbar spine previous PLIF L4-5,no unexpected finding disc protrusion  at L3-4 with a spinal stenosis.Patient able to walk without any difficulties.Patient was discharged by Dr. Venetia Constable 02/03/21. I have discussed Dr. Ellene Route who will be seeing the patient later today.  Discussed with  neurosurgery/PCCM/nursing staff.  Monitor patient closely at risk of decompensation given his extreme agitation.Requested one-to-one.  I have called and updated patient's son about his current clinical situation and he understands  Reevaluated him at 1 pm. He had just gotten ativan 4 mg iv- and ,he has received total 18 mg iv in the past 12 hours Needing mitten. Remains delirious. I have discussed w/ PCCM , spoke w/ Dr Shearon Stalls for icu transfer for precedex drip, given that he is needing significant amount ativan,worried about respiratory depression. He had another BM this afternoon and starting to get agitated again. One to one is not available (due to staffing) He was re-evalauted again at 3:10 pm again received Ativan 4 mg. PCCM team is aware.

## 2021-02-07 NOTE — ED Notes (Signed)
Pt pulled off all wires and pulled PIV out.  Pt unable to follow directions.

## 2021-02-07 NOTE — Progress Notes (Signed)
eLink Physician-Brief Progress Note Patient Name: [name] DOB: November 11, 1971 MRN: [id number]   Date of Service  02/07/2021  HPI/Events of Note  Nursing request to change Q 6 hour POC CBGs to Q 4 hours.   eICU Interventions  Will change POC CBG to Q 4 hours.      Intervention Category Major Interventions: Other:  Surafel, Hilleary 02/07/2021, 8:35 PM

## 2021-02-07 NOTE — Significant Event (Signed)
Rapid Response Event Note   Reason for Call :  CIWA  Initial Focused Assessment:  RN called regarding this patient having continuous high CIWA score.  The patient was lying in bed backwards when I got to the room.  He was completely naked, soiled with stool, and talking to inanimate objects, and trying to remove his mitts with his teeth.  The patient was completely disoriented.  The patient's vital signs are stable. I instructed the RN to administer Lorazepam as ordered for his CIWA of 19.  The patient has had 19 mg of Lorazepam so far today     Interventions:  Patient cleaned up and new sheets put on bed.  Patient placed on bed pan because he had to move his bowels again.  Vitals taken and lorazepam given IV.  Plan of Care:  CCM is aware of this patient and has seen him today.  He has had 19 mg of Lorazepam today.   Event Summary:   MD Notified: Hospitalist Call Time: [place] Time: 9574 End Time: [place], RN

## 2021-02-08 ENCOUNTER — Inpatient Hospital Stay (HOSPITAL_COMMUNITY): Payer: 59

## 2021-02-08 DIAGNOSIS — R7989 Other specified abnormal findings of blood chemistry: Secondary | ICD-10-CM

## 2021-02-08 DIAGNOSIS — C184 Malignant neoplasm of transverse colon: Secondary | ICD-10-CM

## 2021-02-08 DIAGNOSIS — I10 Essential (primary) hypertension: Secondary | ICD-10-CM | POA: Diagnosis not present

## 2021-02-08 DIAGNOSIS — R410 Disorientation, unspecified: Secondary | ICD-10-CM | POA: Diagnosis not present

## 2021-02-08 LAB — BASIC METABOLIC PANEL
Anion gap: 10 (ref 5–15)
Anion gap: 9 (ref 5–15)
BUN: 15 mg/dL (ref 6–20)
BUN: 12 mg/dL (ref 6–20)
CO2: 27 mmol/L (ref 22–32)
CO2: 28 mmol/L (ref 22–32)
Calcium: 8.5 mg/dL — ABNORMAL LOW (ref 8.9–10.3)
Calcium: 8.2 mg/dL — ABNORMAL LOW (ref 8.9–10.3)
Chloride: 92 mmol/L — ABNORMAL LOW (ref 98–111)
Chloride: 93 mmol/L — ABNORMAL LOW (ref 98–111)
Creatinine, Ser: 0.96 mg/dL (ref 0.61–1.24)
Creatinine, Ser: 0.86 mg/dL (ref 0.61–1.24)
GFR, Estimated: >60 mL/min (ref >60)
GFR, Estimated: >60 mL/min (ref >60)
Glucose, Bld: 112 mg/dL — ABNORMAL HIGH (ref 70–99)
Glucose, Bld: 100 mg/dL — ABNORMAL HIGH (ref 70–99)
Potassium: 3.4 mmol/L — ABNORMAL LOW (ref 3.5–5.1)
Potassium: 3.5 mmol/L (ref 3.5–5.1)
Sodium: 129 mmol/L — ABNORMAL LOW (ref 135–145)
Sodium: 130 mmol/L — ABNORMAL LOW (ref 135–145)

## 2021-02-08 LAB — GLUCOSE, CAPILLARY
Glucose-Capillary: 63 mg/dL — ABNORMAL LOW (ref 70–99)
Glucose-Capillary: 77 mg/dL (ref 70–99)
Glucose-Capillary: 80 mg/dL (ref 70–99)
Glucose-Capillary: 81 mg/dL (ref 70–99)
Glucose-Capillary: 82 mg/dL (ref 70–99)
Glucose-Capillary: 83 mg/dL (ref 70–99)
Glucose-Capillary: 90 mg/dL (ref 70–99)

## 2021-02-08 LAB — CBC
HCT: 31.3 % — ABNORMAL LOW (ref 39.0–52.0)
Hemoglobin: 11.3 g/dL — ABNORMAL LOW (ref 13.0–17.0)
MCH: 34.9 pg — ABNORMAL HIGH (ref 26.0–34.0)
MCHC: 36.1 g/dL — ABNORMAL HIGH (ref 30.0–36.0)
MCV: 96.6 fL (ref 80.0–100.0)
Platelets: 177 10*3/uL (ref 150–400)
RBC: 3.24 MIL/uL — ABNORMAL LOW (ref 4.22–5.81)
RDW: 13.7 % (ref 11.5–15.5)
WBC: 2.1 10*3/uL — ABNORMAL LOW (ref 4.0–10.5)
nRBC: 0 % (ref 0.0–0.2)

## 2021-02-08 LAB — COMPREHENSIVE METABOLIC PANEL
ALT: 100 U/L — ABNORMAL HIGH (ref 0–44)
AST: 177 U/L — ABNORMAL HIGH (ref 15–41)
Albumin: 2.2 g/dL — ABNORMAL LOW (ref 3.5–5.0)
Alkaline Phosphatase: 75 U/L (ref 38–126)
Anion gap: 9 (ref 5–15)
BUN: 13 mg/dL (ref 6–20)
CO2: 28 mmol/L (ref 22–32)
Calcium: 8.2 mg/dL — ABNORMAL LOW (ref 8.9–10.3)
Chloride: 89 mmol/L — ABNORMAL LOW (ref 98–111)
Creatinine, Ser: 0.84 mg/dL (ref 0.61–1.24)
GFR, Estimated: >60 mL/min (ref >60)
Glucose, Bld: 88 mg/dL (ref 70–99)
Potassium: 3 mmol/L — ABNORMAL LOW (ref 3.5–5.1)
Sodium: 126 mmol/L — ABNORMAL LOW (ref 135–145)
Total Bilirubin: 2 mg/dL — ABNORMAL HIGH (ref 0.3–1.2)
Total Protein: 5.5 g/dL — ABNORMAL LOW (ref 6.5–8.1)

## 2021-02-08 LAB — MAGNESIUM: Magnesium: 2.2 mg/dL (ref 1.7–2.4)

## 2021-02-08 MED ORDER — POTASSIUM CHLORIDE 10 MEQ/100ML IV SOLN
10.0000 meq | INTRAVENOUS | Status: AC
  Administered 2021-02-08 (×4): 10 meq via INTRAVENOUS
  Filled 2021-02-08 (×4): qty 100

## 2021-02-08 MED ORDER — KCL IN DEXTROSE-NACL 40-5-0.45 MEQ/L-%-% IV SOLN
INTRAVENOUS | Status: DC
  Filled 2021-02-08 (×2): qty 1000

## 2021-02-08 MED ORDER — DEXTROSE 50 % IV SOLN
INTRAVENOUS | Status: AC
  Administered 2021-02-08: 25 mL
  Filled 2021-02-08: qty 50

## 2021-02-08 NOTE — Progress Notes (Addendum)
Hypoglycemic Event  CBG: 63  Treatment: 12.5 gm (25 mL) Dextrose IV Symptoms: None noted  Follow-up CBG: Time: 1240 CBG Result: 90  Possible Reasons for Event: NPO  Comments/MD notified: Will continue to monitor.    Charles Santos

## 2021-02-08 NOTE — Progress Notes (Signed)
Received from 30M via bed; oriented patient to room and unit routine.  Fall safety reviewed; bed is low, locked and alarmed; will place floor matts.

## 2021-02-08 NOTE — Progress Notes (Signed)
NAME:  Charles Santos, MRN:  [id number], DOB:  [birth year], LOS: 1 ADMISSION DATE:  02/06/2021, CONSULTATION DATE:  2/26 REFERRING MD:  Lupita Leash [facility], CHIEF COMPLAINT:  Confusion   Brief History:  50 y/o admitted 2/25 with delirium, hallucinations due to EtOH withdrawal post lumbar spine surgery.  Past Medical History:  Spinal stenosis, s/p L4-5 MIS TLIF (02/02/21)  EtOH use HTN Anxiety Depression Colon cancer, s/p subtotal collectomy Emphysema HA HLD GERD  Significant Hospital Events:  2/25 admitted to [facility] for suspected etoh dts, acute hyponatremia 2/26 worse agitation, rising CIWA scores. CCM consulted for possible precedex  Consults:  CCM  Procedures:    Significant Diagnostic Tests:  2/26 CT H no acute intracranial abnormality.  Micro Data:    Antimicrobials:     Interim History / Subjective:  Feeling better today Off precedex  Objective   Blood pressure (!) 109/57, pulse 78, temperature 99.4 F (37.4 C), temperature source Oral, resp. rate 20, height 5\' 10"  (1.778 m), weight 97.2 kg, SpO2 100 %.        Intake/Output Summary (Last 24 hours) at 02/08/2021 0741 Last data filed at 02/08/2021 5621 Gross per 24 hour  Intake 1914.52 ml  Output 2955 ml  Net -1040.48 ml   Filed Weights   02/06/21 1825 02/07/21 0615  Weight: 97.5 kg 97.2 kg    Examination: General:  Resting comfortably in bed HENT: NCAT OP clear PULM: CTA B, normal effort CV: RRR, no mgr GI: BS+, soft, nontender MSK: normal bulk and tone Neuro: drowsy but will wake up, speak appropriately   Resolved Hospital Problem list     Assessment & Plan:  Acute metabolic encephalopathy due to alcohol withdrawal > improved Continue CIWA protocol Tele Progressive care D/c preedex Thiamine/folate  Hyponatremia Monitor Monitor BMET and UOP Replace electrolytes as needed  Hypokalemia  Change fluids to D5 1/2NS with 40KCL  Transaminitis, alcoholic hepatitis Ileus on CT Gentle fluids Advance  diet on 2/28  Hypoglycemia D5 1/2 NS  Recent lumbar spine surgery Per NSGY  Best practice (evaluated daily)  Diet: NPO Pain/Anxiety/Delirium protocol (if indicated): n/a VAP protocol (if indicated): yes DVT prophylaxis: SCD GI prophylaxis: n/a Glucose control: monitor Mobility: bed rest Disposition: to TRH, PCCM off  Goals of Care:  Last date of multidisciplinary goals of care discussion:n/a Family and staff present: n/a Summary of discussion: n/a Follow up goals of care discussion due: n/a Code Status: ful  Labs   CBC: Recent Labs  Lab 02/06/21 1831 02/07/21 0503 02/08/21 0150  WBC 8.8 3.7* 2.1*  HGB 12.7* 11.6* 11.3*  HCT 34.9* 32.5* 31.3*  MCV 95.4 95.9 96.6  PLT 175 156 308    Basic Metabolic Panel: Recent Labs  Lab 02/06/21 1831 02/07/21 0503 02/07/21 0917 02/07/21 1249 02/07/21 1700 02/08/21 0150  NA 121* 125* 125* 126* 127* 126*  K 3.0* 2.9* 3.1* 3.9 3.0* 3.0*  CL 75* 84* 84* 86* 85* 89*  CO2 34* 29 30 29 28 28   GLUCOSE 113* 118* 106* 104* 92 88  BUN 10 12 11 13 12 13   CREATININE 0.90 0.95 0.94 0.95 0.89 0.84  CALCIUM 8.9 8.1* 8.2* 8.1* 8.4* 8.2*  MG 2.4 2.2  --   --   --  2.2   GFR: Estimated Creatinine Clearance: 125.8 mL/min (by C-G formula based on SCr of 0.84 mg/dL). Recent Labs  Lab 02/06/21 1831 02/06/21 2229 02/06/21 2321 02/07/21 0503 02/08/21 0150  WBC 8.8  --   --  3.7* 2.1*  LATICACIDVEN  --  1.6 1.3  --   --     Liver Function Tests: Recent Labs  Lab 02/06/21 1831 02/07/21 0503 02/08/21 0150  AST 330* 260* 177*  ALT 138* 122* 100*  ALKPHOS 87 77 75  BILITOT 1.8* 2.1* 2.0*  PROT 6.4* 5.6* 5.5*  ALBUMIN 2.8* 2.4* 2.2*   Recent Labs  Lab 02/07/21 0503  LIPASE 26   Recent Labs  Lab 02/06/21 2229  AMMONIA 39*    ABG No results found for: PHART, PCO2ART, PO2ART, HCO3, TCO2, ACIDBASEDEF, O2SAT   Coagulation Profile: Recent Labs  Lab 02/06/21 2229  INR 1.0    Cardiac Enzymes: No results for input(s):  CKTOTAL, CKMB, CKMBINDEX, TROPONINI in the last 168 hours.  HbA1C: Hemoglobin A1C  Date/Time Value Ref Range Status  10/17/2013 10:10 AM 4.6  Final    Comment:    4.2-6.3% normal range   Hgb A1c MFr Bld  Date/Time Value Ref Range Status  09/06/2016 01:30 PM 4.8 4.8 - 5.6 % Final    Comment:    (NOTE)         Pre-diabetes: 5.7 - 6.4         Diabetes: >6.4         Glycemic control for adults with diabetes: <7.0     CBG: Recent Labs  Lab 02/07/21 1834 02/07/21 1932 02/07/21 2305 02/08/21 0304 02/08/21 0712  GLUCAP 86 94 79 80 82    Critical care time: 32 minutes     Roselie Awkward, MD [place] P[facility]M Pager: [phone] Cell: [phone] If no response, please call [phone] until 7pm After 7:00 pm call Elink  [phone]

## 2021-02-08 NOTE — Progress Notes (Signed)
[facility] Progress Note Patient Name: Veryl Winemiller DOB: 10/18/1971 MRN: [id number]   Date of Service  02/08/2021  HPI/Events of Note  Hypokalemia - K+ = 3.0 and Creatinine = 0.84.   eICU Interventions  Will replace K+.     Intervention Category Major Interventions: Electrolyte abnormality - evaluation and management  Elaria Osias,Faizon Eugene 02/08/2021, 4:29 AM

## 2021-02-08 NOTE — Progress Notes (Signed)
[facility] Progress Note Patient Name: Marcy Bogosian DOB: 1971-02-02 MRN: [id number]   Date of Service  02/08/2021  HPI/Events of Note  Multiple liquid stools. Request for Flexiseal.   eICU Interventions  Will order Flexiseal.      Intervention Category Major Interventions: Other:  Neldon, Shepard 02/08/2021, 4:48 AM

## 2021-02-08 NOTE — Consult Note (Signed)
Reason for Consult: Recent lumbar spine surgery with new onset delirium Referring Physician: Dr. Antonieta Pert  Charles Santos is an 50 y.o. male.  HPI: Charles Santos is a 50 year old individual who underwent surgical decompression at L4-L5 by Dr. Juanda Bond on 02/03/2021 of this year Charles Santos was discharged home.  Charles Santos presented in severe delirium in the emergency department and was admitted for detoxification as Charles Santos had been drinking in several days.  Was consulted because the Charles Santos has an extremely large ecchymosis in the lumbar spine radiating around the buttocks and into the thighs.  I have reviewed these findings and indeed note that there is significant ecchymosis but no focal area of a hematoma per se.  Postoperatively the Charles Santos had some mild extensor houses longus weakness that was noted by Dr. Venetia Constable.  This appears to be persisting but Charles Santos is moving his lower extremities well otherwise.  Past Medical History:  Diagnosis Date  . Acute midline low back pain with bilateral sciatica 05/16/2020  . Anxiety   . Cancer of transverse colon ([place]) 09/17/2016   surgery done  . Colon cancer [facility]) 1998   surgery and chemo  . Complication of anesthesia    not fully under for polyp removal at [place] 2017  . Depression   . Emphysema of lung ([place])   . Family hx of colon cancer   . GERD (gastroesophageal reflux disease)    none recent  . Headache   . Hyperlipidemia   . Hypertension   . Vitamin D deficiency     Past Surgical History:  Procedure Laterality Date  . APPENDECTOMY    . colon resectomy  1998   2 degree herida  . COLONOSCOPY    . ESOPHAGOGASTRODUODENOSCOPY (EGD) WITH PROPOFOL N/A 05/19/2017   Procedure: ESOPHAGOGASTRODUODENOSCOPY (EGD) WITH PROPOFOL;  Surgeon: [name], MD;  Location: [facility];  Service: Endoscopy;  Laterality: N/A;  . FLEXIBLE SIGMOIDOSCOPY N/A 05/19/2017   Procedure: FLEXIBLE SIGMOIDOSCOPY;  Surgeon: [name], MD;  Location[facility];  Service:  Endoscopy;  Laterality: N/A;  . INGUINAL HERNIA REPAIR    . LAPAROSCOPIC SUBTOTAL COLECTOMY N/A 09/17/2016   Procedure: DIAGNOSTIC LAPAROSCOPY EXPLORATORY LAPAROTOMY SUBTOTAL COLECTOMY PROCTOSCOPY;  Surgeon: [name], MD;  Location: [facility];  Service: General;  Laterality: N/A;  . polyp removal at [place]  2017  . PROCTOSCOPY  09/17/2016   Procedure: PROCTOSCOPY;  Surgeon: [name], MD;  Location: [facility];  Service: General;;  . TRANSFORAMINAL LUMBAR INTERBODY FUSION W/ MIS 1 LEVEL Right 02/02/2021   Procedure: Right Lumbar four -lumbar five minimally invasive transforaminal lumbar interbody fusion;  Surgeon: Judith Part, MD;  Location: [place];  Service: Neurosurgery;  Laterality: Right;  . UPPER GI ENDOSCOPY      Family History  Problem Relation Age of Onset  . Colon cancer Father        pt thinks Charles Santos was in his 1's when dx  . Esophageal cancer Neg Hx   . Rectal cancer Neg Hx   . Stomach cancer Neg Hx   . Prostate cancer Neg Hx     Social History:  reports that Charles Santos quit smoking about 4 months ago. His smoking use included cigarettes. Charles Santos has a 23.00 pack-year smoking history. Charles Santos has never used smokeless tobacco. Charles Santos reports current alcohol use of about 24.0 standard drinks of alcohol per week. Charles Santos reports that Charles Santos does not use drugs.  Allergies:  Allergies  Allergen Reactions  . Adhesive [Tape] Rash and Other (  See Comments)    Prefers paper tape    Medications: I have reviewed the Charles Santos's current medications.  Results for orders placed or performed during the hospital encounter of 02/06/21 (from the past 48 hour(s))  Comprehensive metabolic panel     Status: Abnormal   Collection Time: 02/06/21  6:31 PM  Result Value Ref Range   Sodium 121 (L) 135 - 145 mmol/L   Potassium 3.0 (L) 3.5 - 5.1 mmol/L   Chloride 75 (L) 98 - 111 mmol/L   CO2 34 (H) 22 - 32 mmol/L   Glucose, Bld 113 (H) 70 - 99 mg/dL    Comment: Glucose reference range applies only to samples taken  after fasting for at least 8 hours.   BUN 10 6 - 20 mg/dL   Creatinine, Ser 0.90 0.61 - 1.24 mg/dL   Calcium 8.9 8.9 - 10.3 mg/dL   Total Protein 6.4 (L) 6.5 - 8.1 g/dL   Albumin 2.8 (L) 3.5 - 5.0 g/dL   AST 330 (H) 15 - 41 U/L   ALT 138 (H) 0 - 44 U/L   Alkaline Phosphatase 87 38 - 126 U/L   Total Bilirubin 1.8 (H) 0.3 - 1.2 mg/dL   GFR, Estimated >60 >60 mL/min    Comment: (NOTE) Calculated using the CKD-EPI Creatinine Equation (2021)    Anion gap 12 5 - 15    Comment: Performed at [facility] [address], [place], [place] [address]  CBC     Status: Abnormal   Collection Time: 02/06/21  6:31 PM  Result Value Ref Range   WBC 8.8 4.0 - 10.5 K/uL   RBC 3.66 (L) 4.22 - 5.81 MIL/uL   Hemoglobin 12.7 (L) 13.0 - 17.0 g/dL   HCT 34.9 (L) 39.0 - 52.0 %   MCV 95.4 80.0 - 100.0 fL   MCH 34.7 (H) 26.0 - 34.0 pg   MCHC 36.4 (H) 30.0 - 36.0 g/dL   RDW 13.2 11.5 - 15.5 %   Platelets 175 150 - 400 K/uL   nRBC 0.0 0.0 - 0.2 %    Comment: Performed at [facility] [address] [place], [place] [address]  Urinalysis, Routine w reflex microscopic Urine, Clean Catch     Status: Abnormal   Collection Time: 02/06/21  6:31 PM  Result Value Ref Range   Color, Urine YELLOW YELLOW   APPearance CLEAR CLEAR   Specific Gravity, Urine 1.011 1.005 - 1.030   pH 6.0 5.0 - 8.0   Glucose, UA NEGATIVE NEGATIVE mg/dL   Hgb urine dipstick SMALL (A) NEGATIVE   Bilirubin Urine NEGATIVE NEGATIVE   Ketones, ur NEGATIVE NEGATIVE mg/dL   Protein, ur NEGATIVE NEGATIVE mg/dL   Nitrite NEGATIVE NEGATIVE   Leukocytes,Ua NEGATIVE NEGATIVE   WBC, UA 0-5 0 - 5 WBC/hpf   Bacteria, UA NONE SEEN NONE SEEN    Comment: Performed at [facility] [address], [place], [place] [address]  Osmolality, urine     Status: None   Collection Time: 02/06/21  6:31 PM  Result Value Ref Range   Osmolality, Ur 330 300 - 900 mOsm/kg    Comment: Performed at [facility] [address],  [place], [place] [address]  Sodium, urine, random     Status: None   Collection Time: 02/06/21  6:31 PM  Result Value Ref Range   Sodium, Ur <10 mmol/L    Comment: Performed at [facility]  [address], [place], [place] [address]  Magnesium     Status: None   Collection Time: 02/06/21  6:31 PM  Result Value Ref Range   Magnesium 2.4 1.7 - 2.4 mg/dL    Comment: Performed at [facility], [place] [address] [place], [place] [address]  Ammonia     Status: Abnormal   Collection Time: 02/06/21 10:29 PM  Result Value Ref Range   Ammonia 39 (H) 9 - 35 umol/L    Comment: Performed at [facility], [place] [address] [place], [place] [address]  Lactic acid, plasma     Status: None   Collection Time: 02/06/21 10:29 PM  Result Value Ref Range   Lactic Acid, Venous 1.6 0.5 - 1.9 mmol/L    Comment: Performed at [facility] [address], [place], [place] [address]  Blood culture (routine x 2)     Status: None (Preliminary result)   Collection Time: 02/06/21 10:29 PM   Specimen: BLOOD  Result Value Ref Range   Specimen Description BLOOD SITE NOT SPECIFIED    Special Requests      BOTTLES DRAWN AEROBIC AND ANAEROBIC Blood Culture adequate volume   Culture      NO GROWTH < 24 HOURS Performed at [facility], [place] [address], [place], [place] [address]    Report Status PENDING   Protime-INR     Status: None   Collection Time: 02/06/21 10:29 PM  Result Value Ref Range   Prothrombin Time 12.8 11.4 - 15.2 seconds   INR 1.0 0.8 - 1.2    Comment: (NOTE) INR goal varies based on device and disease states. Performed at [facility], [place] [address] [place], [place] [address]   Blood culture (routine x 2)     Status: None (Preliminary result)   Collection Time: 02/06/21 10:44 PM   Specimen: BLOOD  Result Value Ref Range   Specimen Description BLOOD SITE NOT SPECIFIED    Special Requests      BOTTLES DRAWN AEROBIC AND ANAEROBIC Blood Culture results may not be  optimal due to an inadequate volume of blood received in culture bottles   Culture      NO GROWTH < 24 HOURS Performed at [facility], [place] [address], [place], [place] [address]    Report Status PENDING   Lactic acid, plasma     Status: None   Collection Time: 02/06/21 11:21 PM  Result Value Ref Range   Lactic Acid, Venous 1.3 0.5 - 1.9 mmol/L    Comment: Performed at [facility], [place] [address], [place], [place] [address]  Basic metabolic panel     Status: Abnormal   Collection Time: 02/07/21  5:03 AM  Result Value Ref Range   Sodium 125 (L) 135 - 145 mmol/L   Potassium 2.9 (L) 3.5 - 5.1 mmol/L   Chloride 84 (L) 98 - 111 mmol/L   CO2 29 22 - 32 mmol/L   Glucose, Bld 118 (H) 70 - 99 mg/dL    Comment: Glucose reference range applies only to samples taken after fasting for at least 8 hours.   BUN 12 6 - 20 mg/dL   Creatinine, Ser 0.95 0.61 - 1.24 mg/dL   Calcium 8.1 (L) 8.9 - 10.3 mg/dL   GFR, Estimated >60 >60 mL/min    Comment: (NOTE) Calculated using the CKD-EPI Creatinine Equation (2021)    Anion gap 12 5 - 15    Comment: Performed at  [facility], [place] [address], [place], [place] [address]  Hepatic function panel     Status: Abnormal   Collection Time: 02/07/21  5:03 AM  Result Value Ref Range   Total Protein 5.6 (L) 6.5 - 8.1 g/dL   Albumin 2.4 (L) 3.5 - 5.0 g/dL   AST 260 (H) 15 - 41 U/L   ALT 122 (H) 0 - 44 U/L   Alkaline Phosphatase 77 38 - 126 U/L   Total Bilirubin 2.1 (H) 0.3 - 1.2 mg/dL   Bilirubin, Direct 0.8 (H) 0.0 - 0.2 mg/dL   Indirect Bilirubin 1.3 (H) 0.3 - 0.9 mg/dL    Comment: Performed at [facility] [address] [place], [place] [address]  CBC     Status: Abnormal   Collection Time: 02/07/21  5:03 AM  Result Value Ref Range   WBC 3.7 (L) 4.0 - 10.5 K/uL   RBC 3.39 (L) 4.22 - 5.81 MIL/uL   Hemoglobin 11.6 (L) 13.0 - 17.0 g/dL   HCT 32.5 (L) 39.0 - 52.0 %   MCV 95.9 80.0 - 100.0 fL   MCH 34.2 (H) 26.0 - 34.0 pg   MCHC  35.7 30.0 - 36.0 g/dL   RDW 13.4 11.5 - 15.5 %   Platelets 156 150 - 400 K/uL   nRBC 0.0 0.0 - 0.2 %    Comment: Performed at [facility], [place] [address] [place], [place] [address]  Magnesium     Status: None   Collection Time: 02/07/21  5:03 AM  Result Value Ref Range   Magnesium 2.2 1.7 - 2.4 mg/dL    Comment: Performed at [facility] [address], [place], [place] [address]  TSH     Status: None   Collection Time: 02/07/21  5:03 AM  Result Value Ref Range   TSH 0.649 0.350 - 4.500 uIU/mL    Comment: Performed by a 3rd Generation assay with a functional sensitivity of <=0.01 uIU/mL. Performed at [facility], [place] [address] [place], [place] [address]   Cortisol     Status: None   Collection Time: 02/07/21  5:03 AM  Result Value Ref Range   Cortisol, Plasma 13.8 ug/dL    Comment: (NOTE) AM    6.7 - 22.6 ug/dL PM   <10.0       ug/dL Performed at [facility] [address] [place], [place] [address]   Osmolality     Status: Abnormal   Collection Time: 02/07/21  5:03 AM  Result Value Ref Range   Osmolality 259 (L) 275 - 295 mOsm/kg    Comment: Performed at [facility], [place] [address] [place], [place] [address]  Acetaminophen level     Status: Abnormal   Collection Time: 02/07/21  5:03 AM  Result Value Ref Range   Acetaminophen (Tylenol), Serum <10 (L) 10 - 30 ug/mL    Comment: (NOTE) Therapeutic concentrations vary significantly. A range of 10-30 ug/mL  may be an effective concentration for many patients. However, some  are best treated at concentrations outside of this range. Acetaminophen concentrations >150 ug/mL at 4 hours after ingestion  and >50 ug/mL at 12 hours after ingestion are often associated with  toxic reactions.  Performed at [facility], [place] [address] [place], [place] [address]   Hepatitis panel, acute     Status: None   Collection Time: 02/07/21  5:03 AM  Result Value Ref Range  Hepatitis B Surface  Ag NON REACTIVE NON REACTIVE   HCV Ab NON REACTIVE NON REACTIVE    Comment: (NOTE) Nonreactive HCV antibody screen is consistent with no HCV infections,  unless recent infection is suspected or other evidence exists to indicate HCV infection.     Hep A IgM NON REACTIVE NON REACTIVE   Hep B C IgM NON REACTIVE NON REACTIVE    Comment: Performed at [facility], [place] [address], [place], [place] [address]  Lipase, blood     Status: None   Collection Time: 02/07/21  5:03 AM  Result Value Ref Range   Lipase 26 11 - 51 U/L    Comment: Performed at [facility] [address] [place], [place] [address]  Troponin I (High Sensitivity)     Status: None   Collection Time: 02/07/21  5:03 AM  Result Value Ref Range   Troponin I (High Sensitivity) 13 <18 ng/L    Comment: (NOTE) Elevated high sensitivity troponin I (hsTnI) values and significant  changes across serial measurements may suggest ACS but many other  chronic and acute conditions are known to elevate hsTnI results.  Refer to the "Links" section for chest pain algorithms and additional  guidance. Performed at [facility], [place] [address] [place], [place] [address]   Glucose, capillary     Status: Abnormal   Collection Time: 02/07/21  7:15 AM  Result Value Ref Range   Glucose-Capillary 109 (H) 70 - 99 mg/dL    Comment: Glucose reference range applies only to samples taken after fasting for at least 8 hours.  Basic metabolic panel     Status: Abnormal   Collection Time: 02/07/21  9:17 AM  Result Value Ref Range   Sodium 125 (L) 135 - 145 mmol/L   Potassium 3.1 (L) 3.5 - 5.1 mmol/L   Chloride 84 (L) 98 - 111 mmol/L   CO2 30 22 - 32 mmol/L   Glucose, Bld 106 (H) 70 - 99 mg/dL    Comment: Glucose reference range applies only to samples taken after fasting for at least 8 hours.   BUN 11 6 - 20 mg/dL   Creatinine, Ser 0.94 0.61 - 1.24 mg/dL   Calcium 8.2 (L) 8.9 - 10.3 mg/dL   GFR, Estimated >60 >60 mL/min     Comment: (NOTE) Calculated using the CKD-EPI Creatinine Equation (2021)    Anion gap 11 5 - 15    Comment: Performed at [facility] [address] [place], [place] [address]  Basic metabolic panel     Status: Abnormal   Collection Time: 02/07/21 12:49 PM  Result Value Ref Range   Sodium 126 (L) 135 - 145 mmol/L   Potassium 3.9 3.5 - 5.1 mmol/L    Comment: SLIGHT HEMOLYSIS   Chloride 86 (L) 98 - 111 mmol/L   CO2 29 22 - 32 mmol/L   Glucose, Bld 104 (H) 70 - 99 mg/dL    Comment: Glucose reference range applies only to samples taken after fasting for at least 8 hours.   BUN 13 6 - 20 mg/dL   Creatinine, Ser 0.95 0.61 - 1.24 mg/dL   Calcium 8.1 (L) 8.9 - 10.3 mg/dL   GFR, Estimated >60 >60 mL/min    Comment: (NOTE) Calculated using the CKD-EPI Creatinine Equation (2021)    Anion gap 11 5 - 15    Comment: Performed at [facility] [address], [place], [place] [address]  Basic metabolic panel     Status: Abnormal   Collection Time: 02/07/21  5:00 PM  Result Value Ref Range   Sodium 127 (L) 135 - 145 mmol/L   Potassium 3.0 (L) 3.5 - 5.1 mmol/L    Comment: NO VISIBLE HEMOLYSIS   Chloride 85 (L) 98 - 111 mmol/L   CO2 28 22 - 32 mmol/L   Glucose, Bld 92 70 - 99 mg/dL    Comment: Glucose reference range applies only to samples taken after fasting for at least 8 hours.   BUN 12 6 - 20 mg/dL   Creatinine, Ser 0.89 0.61 - 1.24 mg/dL   Calcium 8.4 (L) 8.9 - 10.3 mg/dL   GFR, Estimated >60 >60 mL/min    Comment: (NOTE) Calculated using the CKD-EPI Creatinine Equation (2021)    Anion gap 14 5 - 15    Comment: Performed at [facility] [address] [place], [place] [address]  Glucose, capillary     Status: None   Collection Time: 02/07/21  6:34 PM  Result Value Ref Range   Glucose-Capillary 86 70 - 99 mg/dL    Comment: Glucose reference range applies only to samples taken after fasting for at least 8 hours.  Glucose, capillary     Status: None   Collection  Time: 02/07/21  7:32 PM  Result Value Ref Range   Glucose-Capillary 94 70 - 99 mg/dL    Comment: Glucose reference range applies only to samples taken after fasting for at least 8 hours.  Glucose, capillary     Status: None   Collection Time: 02/07/21 11:05 PM  Result Value Ref Range   Glucose-Capillary 79 70 - 99 mg/dL    Comment: Glucose reference range applies only to samples taken after fasting for at least 8 hours.  Comprehensive metabolic panel     Status: Abnormal   Collection Time: 02/08/21  1:50 AM  Result Value Ref Range   Sodium 126 (L) 135 - 145 mmol/L   Potassium 3.0 (L) 3.5 - 5.1 mmol/L   Chloride 89 (L) 98 - 111 mmol/L   CO2 28 22 - 32 mmol/L   Glucose, Bld 88 70 - 99 mg/dL    Comment: Glucose reference range applies only to samples taken after fasting for at least 8 hours.   BUN 13 6 - 20 mg/dL   Creatinine, Ser 0.84 0.61 - 1.24 mg/dL   Calcium 8.2 (L) 8.9 - 10.3 mg/dL   Total Protein 5.5 (L) 6.5 - 8.1 g/dL   Albumin 2.2 (L) 3.5 - 5.0 g/dL   AST 177 (H) 15 - 41 U/L   ALT 100 (H) 0 - 44 U/L   Alkaline Phosphatase 75 38 - 126 U/L   Total Bilirubin 2.0 (H) 0.3 - 1.2 mg/dL   GFR, Estimated >60 >60 mL/min    Comment: (NOTE) Calculated using the CKD-EPI Creatinine Equation (2021)    Anion gap 9 5 - 15    Comment: Performed at [facility], [place] [address] [place], [place] [address]  CBC     Status: Abnormal   Collection Time: 02/08/21  1:50 AM  Result Value Ref Range   WBC 2.1 (L) 4.0 - 10.5 K/uL   RBC 3.24 (L) 4.22 - 5.81 MIL/uL   Hemoglobin 11.3 (L) 13.0 - 17.0 g/dL   HCT 31.3 (L) 39.0 - 52.0 %   MCV 96.6 80.0 - 100.0 fL   MCH 34.9 (H) 26.0 - 34.0 pg   MCHC  36.1 (H) 30.0 - 36.0 g/dL   RDW 13.7 11.5 - 15.5 %   Platelets 177 150 - 400 K/uL   nRBC 0.0 0.0 - 0.2 %    Comment: Performed at [facility], [place] [address] [place], [place] [address]  Magnesium     Status: None   Collection Time: 02/08/21  1:50 AM  Result Value Ref Range   Magnesium 2.2  1.7 - 2.4 mg/dL    Comment: Performed at [facility] [address], [place], [place] [address]  Glucose, capillary     Status: None   Collection Time: 02/08/21  3:04 AM  Result Value Ref Range   Glucose-Capillary 80 70 - 99 mg/dL    Comment: Glucose reference range applies only to samples taken after fasting for at least 8 hours.    DG Abd 1 View  Result Date: 02/07/2021 CLINICAL DATA:  Abdominal distension, evaluate ileus. EXAM: ABDOMEN - 1 VIEW COMPARISON:  CT abdomen pelvis February 06, 2021. FINDINGS: Similar diffuse small bowel dilation measuring up to 4.2 cm. Lower lumbar posterior fixation hardware and disc spacer. IMPRESSION: Similar diffuse small bowel dilation measuring up to 4.2 cm compatible with ileus versus obstruction. Electronically Signed   By: Dahlia Bailiff MD   On: 02/07/2021 10:44   CT HEAD WO CONTRAST  Result Date: 02/07/2021 CLINICAL DATA:  Mental status changes of unknown cause. EXAM: CT HEAD WITHOUT CONTRAST TECHNIQUE: Contiguous axial images were obtained from the base of the skull through the vertex without intravenous contrast. COMPARISON:  None. FINDINGS: Brain: The brain shows a normal appearance without evidence of malformation, atrophy, old or acute small or large vessel infarction, mass lesion, hemorrhage, hydrocephalus or extra-axial collection. Vascular: No hyperdense vessel. No evidence of atherosclerotic calcification. Skull: Normal.  No traumatic finding.  No focal bone lesion. Sinuses/Orbits: Sinuses are clear. Orbits appear normal. Mastoids are clear. Other: None significant IMPRESSION: Normal head CT. Electronically Signed   By: Nelson Chimes M.D.   On: 02/07/2021 03:33   CT ABDOMEN PELVIS W CONTRAST  Result Date: 02/06/2021 CLINICAL DATA:  Acute abdominal pain.  Recent lumbar surgery. EXAM: CT ABDOMEN AND PELVIS WITH CONTRAST TECHNIQUE: Multidetector CT imaging of the abdomen and pelvis was performed using the standard protocol following bolus  administration of intravenous contrast. CONTRAST:  170mL OMNIPAQUE IOHEXOL 300 MG/ML  SOLN COMPARISON:  06/29/2020 FINDINGS: Lower chest: Minimal linear atelectasis at the lung bases. Hepatobiliary: Normal Pancreas: Normal Spleen: Normal Adrenals/Urinary Tract: Adrenal glands are normal. Kidneys are normal. Bladder is normal. Stomach/Bowel: Stomach is normal. Dilated fluid and air-filled small intestine. Previous colectomy with residual rectosigmoid. I do not see any nondilated loops of small intestine. The differential diagnosis is postoperative ileus versus small-bowel obstruction at the small bowel-colon anastomosis. Fairly large amount of stool in the rectosigmoid. Vascular/Lymphatic: Aortic atherosclerosis. No aneurysm. IVC is normal. No adenopathy. Reproductive: Normal Other: Tiny amount fluid in the peritoneal space. No free air. Bilateral inguinal hernias containing only fat. Musculoskeletal: Previous PLIF L4-5.  See results of lumbar MRI. IMPRESSION: 1. Previous colectomy with residual rectosigmoid. Dilated fluid and air-filled small intestine all the way to the small bowel-colon anastomosis. The differential diagnosis is postoperative ileus versus small-bowel obstruction at the small bowel-colon anastomosis. No sign of free air. Tiny amount of free fluid in the peritoneal space. 2. Bilateral inguinal hernias containing only fat. 3. Aortic atherosclerosis. 4. Previous PLIF L4-5. See results of lumbar MRI. Aortic Atherosclerosis (ICD10-I70.0). Electronically Signed   By: Elta Guadeloupe  Shogry M.D.   On: 02/06/2021 23:21   CT L-SPINE NO CHARGE  Result Date: 02/06/2021 CLINICAL DATA:  Recent lumbar surgery.  Back and abdominal pain. EXAM: CT LUMBAR SPINE WITHOUT CONTRAST TECHNIQUE: Multidetector CT imaging of the lumbar spine was performed without intravenous contrast administration. Multiplanar CT image reconstructions were also generated. COMPARISON:  Operative images [id number].  Preoperative MRI 11/25/2020.  FINDINGS: Segmentation: 5 lumbar type vertebral bodies. Alignment: Normal Vertebrae: Previous PLIF L4-5. See below. No other significant focal bone finding. Paraspinal and other soft tissues: Expected air/gas in posterior soft tissues of the surgical approach. Disc levels: No significant finding at L2-3 or above. L3-4: Broad-based disc herniation. Spinal stenosis at this level that could cause neural compression. Difficult to determine if this represents a change from the preoperative MRI. L4-5: Previous PLIF. Considerable artifact related to hardware. No evidence of hardware malposition. No unexpected finding. L5-S1: Degeneration of the disc with mild vacuum phenomenon and mild bulging. Mild facet osteoarthritis per no compressive stenosis. IMPRESSION: 1. Previous PLIF L4-5. No unexpected finding. 2. Disc protrusion at L3-4 with spinal stenosis. This could cause neural compression. Is difficult to tell this represents a change in relation to the preoperative MRI dated 11/25/2020. 3. Expected air/gas in the posterior soft tissues of the surgical approach. Electronically Signed   By: Nelson Chimes M.D.   On: 02/06/2021 23:15   DG CHEST PORT 1 VIEW  Result Date: 02/07/2021 CLINICAL DATA:  Hallucinations, back surgery EXAM: PORTABLE CHEST 1 VIEW COMPARISON:  12/22/2020 FINDINGS: Lungs volumes are small, but are symmetric and are clear on this rotated examination. No pneumothorax or pleural effusion. Cardiac size within normal limits. Pulmonary vascularity is normal. Osseous structures are age-appropriate. No acute bone abnormality. IMPRESSION: No active disease. Electronically Signed   By: Fidela Salisbury MD   On: 02/07/2021 05:54    Review of Systems Blood pressure 110/70, pulse 81, temperature (!) 100.8 F (38.2 C), temperature source Axillary, resp. rate (!) 24, height 5\' 10"  (1.778 m), weight 97.2 kg, SpO2 97 %. Physical Exam Constitutional:      Appearance: Charles Santos is normal weight.     Comments: Agitated  and delirious does not focus attention at all and is writhing around with all 4 extremities.  HENT:     Head: Normocephalic and atraumatic.     Nose: Nose normal.     Mouth/Throat:     Mouth: Mucous membranes are moist.  Eyes:     Extraocular Movements: Extraocular movements intact.  Abdominal:     General: Abdomen is flat.     Palpations: Abdomen is soft.  Musculoskeletal:     Comments: Moving all extremities well.  Back has a large ecchymoses has been documented in the photograph elsewhere.  No significant focal hematoma and incision is clean and dry.  Skin:    General: Skin is warm and dry.  Neurological:     Mental Status: Charles Santos is disoriented.     Comments: Cranial nerve examination reveals the pupils are 3 mm and briskly reactive the extraocular movements are full the face is symmetric motor function is intact in the upper and lower extremities.  Charles Santos is in an acute agitated delirium.  Charles Santos has already received significant doses of Ativan.     Assessment/Plan: Significant postoperative ecchymoses around lumbar surgical site.  No focal hematoma noted. Postoperative delirium secondary to acute withdrawal syndrome.  Plan management per medical team for delirium tremens.  We will observe lumbar wound and I will inform Dr. Venetia Constable.  No specific intervention required.  Blanchie Dessert Mortimer Bair 02/08/2021, 6:21 AM

## 2021-02-09 ENCOUNTER — Inpatient Hospital Stay (HOSPITAL_COMMUNITY): Payer: 59

## 2021-02-09 LAB — GLUCOSE, CAPILLARY
Glucose-Capillary: 94 mg/dL (ref 70–99)
Glucose-Capillary: 107 mg/dL — ABNORMAL HIGH (ref 70–99)
Glucose-Capillary: 116 mg/dL — ABNORMAL HIGH (ref 70–99)
Glucose-Capillary: 111 mg/dL — ABNORMAL HIGH (ref 70–99)
Glucose-Capillary: 105 mg/dL — ABNORMAL HIGH (ref 70–99)

## 2021-02-09 LAB — CBC
HCT: 35.8 % — ABNORMAL LOW (ref 39.0–52.0)
Hemoglobin: 12.4 g/dL — ABNORMAL LOW (ref 13.0–17.0)
MCH: 33.9 pg (ref 26.0–34.0)
MCHC: 34.6 g/dL (ref 30.0–36.0)
MCV: 97.8 fL (ref 80.0–100.0)
Platelets: 236 10*3/uL (ref 150–400)
RBC: 3.66 MIL/uL — ABNORMAL LOW (ref 4.22–5.81)
RDW: 13.5 % (ref 11.5–15.5)
WBC: 3.2 10*3/uL — ABNORMAL LOW (ref 4.0–10.5)
nRBC: 0 % (ref 0.0–0.2)

## 2021-02-09 LAB — COMPREHENSIVE METABOLIC PANEL
ALT: 89 U/L — ABNORMAL HIGH (ref 0–44)
AST: 123 U/L — ABNORMAL HIGH (ref 15–41)
Albumin: 2.3 g/dL — ABNORMAL LOW (ref 3.5–5.0)
Alkaline Phosphatase: 81 U/L (ref 38–126)
Anion gap: 10 (ref 5–15)
BUN: 14 mg/dL (ref 6–20)
CO2: 25 mmol/L (ref 22–32)
Calcium: 8.6 mg/dL — ABNORMAL LOW (ref 8.9–10.3)
Chloride: 96 mmol/L — ABNORMAL LOW (ref 98–111)
Creatinine, Ser: 0.92 mg/dL (ref 0.61–1.24)
GFR, Estimated: >60 mL/min (ref >60)
Glucose, Bld: 109 mg/dL — ABNORMAL HIGH (ref 70–99)
Potassium: 3.8 mmol/L (ref 3.5–5.1)
Sodium: 131 mmol/L — ABNORMAL LOW (ref 135–145)
Total Bilirubin: 1.3 mg/dL — ABNORMAL HIGH (ref 0.3–1.2)
Total Protein: 5.9 g/dL — ABNORMAL LOW (ref 6.5–8.1)

## 2021-02-09 LAB — AMMONIA: Ammonia: 31 umol/L (ref 9–35)

## 2021-02-09 LAB — VITAMIN B12: Vitamin B-12: 351 pg/mL (ref 180–914)

## 2021-02-09 LAB — FOLATE: Folate: 31.8 ng/mL (ref >5.9)

## 2021-02-09 MED ORDER — THIAMINE HCL 100 MG/ML IJ SOLN
500.0000 mg | Freq: Three times a day (TID) | INTRAVENOUS | Status: AC
  Administered 2021-02-09 – 2021-02-12 (×9): 500 mg via INTRAVENOUS
  Filled 2021-02-09 (×10): qty 5

## 2021-02-09 MED ORDER — THIAMINE HCL 100 MG/ML IJ SOLN
250.0000 mg | Freq: Every day | INTRAVENOUS | Status: DC
  Administered 2021-02-12: 250 mg via INTRAVENOUS
  Filled 2021-02-09 (×2): qty 2.5

## 2021-02-09 MED ORDER — SODIUM CHLORIDE 0.9 % IV SOLN: INTRAVENOUS | Status: DC

## 2021-02-09 MED ORDER — OXYCODONE HCL 5 MG PO TABS
5.0000 mg | ORAL_TABLET | ORAL | Status: DC | PRN
  Administered 2021-02-09 – 2021-02-12 (×5): 5 mg via ORAL
  Filled 2021-02-09 (×6): qty 1

## 2021-02-09 MED ORDER — THIAMINE HCL 100 MG PO TABS: 100.0000 mg | ORAL_TABLET | Freq: Every day | ORAL | Status: DC

## 2021-02-09 NOTE — Progress Notes (Signed)
PROGRESS NOTE    Charles Santos  VXY:[id number] DOB: January 18, 1971 DOA: 02/06/2021 PCP: Charles Fraise, MD   Chief Complaint  Patient presents with  . Hallucinations    Brief Narrative:  53 y with Charles Santos hx etoh abuse, HT, depression, anxiety, colon cancer s/p subtotal colectomy, emphysema, GERD, and multiple other medical problems who was admitted on 8/27MBEM complicated etoh withdrawal with DT's after lumbar spine surgery on 02/02/21 for spinal stenosis (s/p L4-5 MIS TLIF).  He was initially admitted to the hospitalist service with withdrawal symptoms and hyponatremia, he was transferred to ICU for precedex.  He's been transferred back to [facility] on 2/28.  Hospitalization complicated by ileus.    Assessment & Plan:   Principal Problem:   Acute delirium Active Problems:   Cancer of transverse colon (Charles Santos)   [name]   Essential hypertension   Hyponatremia   Hypokalemia   Elevated LFTs  Acute metabolic encephalopathy due to alcohol withdrawal Delirium seems to be improving ciwa - he's now off precedex Thiamine, folate Encourage cessation Normal ammonia, b12, folate  Hyponatremia Continue IVF  Transaminitis, alcoholic hepatitis maddrey discriminant score doesn't indicate need for sterodis Continue to follow  CT abd/pelvis with normal hepatobiliary findings  Ileus CT from 2/25 with dilated fluid and air filled small intestine all the way to the small bowel colon anastomosis - ddx post op ileus vs SBP at small bowel colon anastomosis kUB 2/28 with mild improvement in ileus - he has rectal tube in place, passing stool Advance to clear liquids  Hypokalemia  Improved, follow  Hypoglycemia Follow with diet  Recent lumbar spine surgery Per NSGY  DVT prophylaxis: SCD Code Status:full  Family Communication: none at bedside Disposition:   Status is: Inpatient  Remains inpatient appropriate because:Inpatient level of care appropriate due to severity of illness   Dispo:  The patient is from: Home              Anticipated d/c is to: Home              Patient currently is not medically stable to d/c.   Difficult to place patient No  Consultants:   Neurology  neurosurgery  Procedures: 2/21 L4-L5 minimally invasive transforaminal lumbar interbody fusion with bilateral L4-L5 pedicle screw placement  Antimicrobials:  Anti-infectives (From admission, onward)   None      Subjective: No new complaints today  Objective: Vitals:   02/09/21 0409 02/09/21 0806 02/09/21 1141 02/09/21 1435  BP: (!) 107/56 (!) 108/54 107/65 122/70  Pulse: 94 93 91 89  Resp: 18 20 20    Temp: 98.2 F (36.8 C) 98.6 F (37 C) 99.5 F (37.5 C)   TempSrc: Oral Oral Oral   SpO2: 100% 97% 94% 96%  Weight:      Height:        Intake/Output Summary (Last 24 hours) at 02/09/2021 1526 Last data filed at 02/09/2021 1017 Gross per 24 hour  Intake 1203.67 ml  Output 2280 ml  Net -1076.33 ml   Filed Weights   02/06/21 1825 02/07/21 0615  Weight: 97.5 kg 97.2 kg    Examination:  General exam: Appears calm and comfortable  Respiratory system: Clear to auscultation. Respiratory effort normal. Cardiovascular system: S1 & S2 heard, RRR Gastrointestinal system: Abdomen is mildly distended, nontender - rectal tube with liquid stool Central nervous system: Alert and oriented. No focal neurological deficits. Extremities: no LEE Skin: back not examined today  Data Reviewed: I have personally reviewed following labs and imaging studies  CBC: Recent Labs  Lab 02/06/21 1831 02/07/21 0503 02/08/21 0150 02/09/21 0540  WBC 8.8 3.7* 2.1* 3.2*  HGB 12.7* 11.6* 11.3* 12.4*  HCT 34.9* 32.5* 31.3* 35.8*  MCV 95.4 95.9 96.6 97.8  PLT 175 156 177 462    Basic Metabolic Panel: Recent Labs  Lab 02/06/21 1831 02/07/21 0503 02/07/21 0917 02/07/21 1700 02/08/21 0150 02/08/21 1303 02/08/21 2159 02/09/21 0540  NA 121* 125*   < > 127* 126* 129* 130* 131*  K 3.0* 2.9*   < >  3.0* 3.0* 3.4* 3.5 3.8  CL 75* 84*   < > 85* 89* 92* 93* 96*  CO2 34* 29   < > 28 28 27 28 25   GLUCOSE 113* 118*   < > 92 88 112* 100* 109*  BUN 10 12   < > 12 13 15 12 14   CREATININE 0.90 0.95   < > 0.89 0.84 0.96 0.86 0.92  CALCIUM 8.9 8.1*   < > 8.4* 8.2* 8.5* 8.2* 8.6*  MG 2.4 2.2  --   --  2.2  --   --   --    < > = values in this interval not displayed.    GFR: Estimated Creatinine Clearance: 114.9 mL/min (by C-G formula based on SCr of 0.92 mg/dL).  Liver Function Tests: Recent Labs  Lab 02/06/21 1831 02/07/21 0503 02/08/21 0150 02/09/21 0540  AST 330* 260* 177* 123*  ALT 138* 122* 100* 89*  ALKPHOS 87 77 75 81  BILITOT 1.8* 2.1* 2.0* 1.3*  PROT 6.4* 5.6* 5.5* 5.9*  ALBUMIN 2.8* 2.4* 2.2* 2.3*    CBG: Recent Labs  Lab 02/08/21 2011 02/08/21 2349 02/09/21 0407 02/09/21 0806 02/09/21 1140  GLUCAP 81 83 94 107* 116*     Recent Results (from the past 240 hour(s))  Resp Panel by RT-PCR (Flu Charles Santos&B, Covid) Nasopharyngeal Swab     Status: None   Collection Time: 02/06/21  1:30 AM   Specimen: Nasopharyngeal Swab; Nasopharyngeal(NP) swabs in vial transport medium  Result Value Ref Range Status   SARS Coronavirus 2 by RT PCR NEGATIVE NEGATIVE Final    Comment: (NOTE) SARS-CoV-2 target nucleic acids are NOT DETECTED.  The SARS-CoV-2 RNA is generally detectable in upper respiratory specimens during the acute phase of infection. The lowest concentration of SARS-CoV-2 viral copies this assay can detect is 138 copies/mL. Charles Santos negative result does not preclude SARS-Cov-2 infection and should not be used as the sole basis for treatment or other patient management decisions. Charles Santos negative result may occur with  improper specimen collection/handling, submission of specimen other than nasopharyngeal swab, presence of viral mutation(s) within the areas targeted by this assay, and inadequate number of viral copies(<138 copies/mL). Charles Santos negative result must be combined with clinical  observations, patient history, and epidemiological information. The expected result is Negative.  Fact Sheet for Patients:  EntrepreneurPulse.com.au  Fact Sheet for Healthcare Providers:  IncredibleEmployment.be  This test is no t yet approved or cleared by the [place] FDA and  has been authorized for detection and/or diagnosis of SARS-CoV-2 by FDA under an Emergency Use Authorization (EUA). This EUA will remain  in effect (meaning this test can be used) for the duration of the COVID-19 declaration under Section 564(b)(1) of the Act, 21 U.S.C.section 360bbb-3(b)(1), unless the authorization is terminated  or revoked sooner.       Influenza Charles Santos by PCR NEGATIVE NEGATIVE Final   Influenza B by PCR NEGATIVE NEGATIVE Final    Comment: (NOTE) The  Xpert Xpress SARS-CoV-2/FLU/RSV plus assay is intended as an aid in the diagnosis of influenza from Nasopharyngeal swab specimens and should not be used as Charles Santos sole basis for treatment. Nasal washings and aspirates are unacceptable for Xpert Xpress SARS-CoV-2/FLU/RSV testing.  Fact Sheet for Patients: EntrepreneurPulse.com.au  Fact Sheet for Healthcare Providers: IncredibleEmployment.be  This test is not yet approved or cleared by the [place] FDA and has been authorized for detection and/or diagnosis of SARS-CoV-2 by FDA under an Emergency Use Authorization (EUA). This EUA will remain in effect (meaning this test can be used) for the duration of the COVID-19 declaration under Section 564(b)(1) of the Act, 21 U.S.C. section 360bbb-3(b)(1), unless the authorization is terminated or revoked.  Performed at [facility], [place] [address], [place], [place] [address]   Blood culture (routine x 2)     Status: None (Preliminary result)   Collection Time: 02/06/21 10:29 PM   Specimen: BLOOD  Result Value Ref Range Status   Specimen Description BLOOD SITE NOT  SPECIFIED  Final   Special Requests   Final    BOTTLES DRAWN AEROBIC AND ANAEROBIC Blood Culture adequate volume   Culture   Final    NO GROWTH 3 DAYS Performed at [facility], [address], [place], [place] [address]    Report Status PENDING  Incomplete  Blood culture (routine x 2)     Status: None (Preliminary result)   Collection Time: 02/06/21 10:44 PM   Specimen: BLOOD  Result Value Ref Range Status   Specimen Description BLOOD SITE NOT SPECIFIED  Final   Special Requests   Final    BOTTLES DRAWN AEROBIC AND ANAEROBIC Blood Culture results may not be optimal due to an inadequate volume of blood received in culture bottles   Culture   Final    NO GROWTH 3 DAYS Performed at [facility], [place] [address], [place], [place] [address]    Report Status PENDING  Incomplete         Radiology Studies: DG Abd 1 View  Result Date: 02/09/2021 CLINICAL DATA:  Patient status post L4-5 fusion 02/02/2021. Patient admitted to the hospital with delirium 02/03/2021. Abdominal discomfort. EXAM: ABDOMEN - 1 VIEW COMPARISON:  Single-view of the abdomen 02/08/2021 and 02/07/2021. FINDINGS: There has been continued improvement and mild gaseous distention of small bowel. There is some gas in the ascending colon. IMPRESSION: Mild improvement in ileus.  No new abnormality. Electronically Signed   By: Inge Rise M.D.   On: 02/09/2021 15:02   DG Abd Portable 1V  Result Date: 02/08/2021 CLINICAL DATA:  Ileus EXAM: PORTABLE ABDOMEN - 1 VIEW COMPARISON:  None. FINDINGS: Left flank and inferior pelvis are excluded from view. Gaseous distension of the small bowel has improved, though has not yet completely resolved in keeping with an improving small-bowel obstruction or ileus. Small amount of gas is now identified within the probable right colon. No gross free intraperitoneal gas. L4-5 lumbar fusion with instrumentation has been performed. IMPRESSION: Improving gaseous distension of the small  bowel in keeping with Shavana Calder resolving small bowel obstruction or ileus. Electronically Signed   By: Fidela Salisbury MD   On: 02/08/2021 07:05        Scheduled Meds: . Chlorhexidine Gluconate Cloth  6 each Topical Daily  . folic acid  1 mg Oral Daily  . LORazepam  0-4 mg Intravenous Q8H  . thiamine  100 mg Oral Daily   Or  . thiamine  100  mg Intravenous Daily   Continuous Infusions: . dexmedetomidine (PRECEDEX) IV infusion Stopped (02/08/21 1022)  . dextrose 5 % and 0.45 % NaCl with KCl 40 mEq/L 75 mL/hr at 02/09/21 0707     LOS: 2 days    Time spent: over 30 min    Fayrene Helper, MD [facility]   To contact the attending provider between 7A-7P or the covering provider during after hours 7P-7A, please log into the web site [email or web address] and access using universal [place] password for that web site. If you do not have the password, please call the hospital operator.  02/09/2021, 3:26 PM

## 2021-02-10 ENCOUNTER — Inpatient Hospital Stay (HOSPITAL_COMMUNITY): Payer: 59

## 2021-02-10 LAB — COMPREHENSIVE METABOLIC PANEL
ALT: 73 U/L — ABNORMAL HIGH (ref 0–44)
AST: 80 U/L — ABNORMAL HIGH (ref 15–41)
Albumin: 2.3 g/dL — ABNORMAL LOW (ref 3.5–5.0)
Alkaline Phosphatase: 75 U/L (ref 38–126)
Anion gap: 10 (ref 5–15)
BUN: 10 mg/dL (ref 6–20)
CO2: 25 mmol/L (ref 22–32)
Calcium: 8.6 mg/dL — ABNORMAL LOW (ref 8.9–10.3)
Chloride: 95 mmol/L — ABNORMAL LOW (ref 98–111)
Creatinine, Ser: 0.96 mg/dL (ref 0.61–1.24)
GFR, Estimated: >60 mL/min (ref >60)
Glucose, Bld: 93 mg/dL (ref 70–99)
Potassium: 3.3 mmol/L — ABNORMAL LOW (ref 3.5–5.1)
Sodium: 130 mmol/L — ABNORMAL LOW (ref 135–145)
Total Bilirubin: 1.2 mg/dL (ref 0.3–1.2)
Total Protein: 6.1 g/dL — ABNORMAL LOW (ref 6.5–8.1)

## 2021-02-10 LAB — CBC
HCT: 34.1 % — ABNORMAL LOW (ref 39.0–52.0)
Hemoglobin: 11.5 g/dL — ABNORMAL LOW (ref 13.0–17.0)
MCH: 33.4 pg (ref 26.0–34.0)
MCHC: 33.7 g/dL (ref 30.0–36.0)
MCV: 99.1 fL (ref 80.0–100.0)
Platelets: 264 10*3/uL (ref 150–400)
RBC: 3.44 MIL/uL — ABNORMAL LOW (ref 4.22–5.81)
RDW: 13.9 % (ref 11.5–15.5)
WBC: 3.6 10*3/uL — ABNORMAL LOW (ref 4.0–10.5)
nRBC: 0 % (ref 0.0–0.2)

## 2021-02-10 LAB — GLUCOSE, CAPILLARY
Glucose-Capillary: 108 mg/dL — ABNORMAL HIGH (ref 70–99)
Glucose-Capillary: 116 mg/dL — ABNORMAL HIGH (ref 70–99)
Glucose-Capillary: 141 mg/dL — ABNORMAL HIGH (ref 70–99)
Glucose-Capillary: 89 mg/dL (ref 70–99)
Glucose-Capillary: 90 mg/dL (ref 70–99)
Glucose-Capillary: 97 mg/dL (ref 70–99)
Glucose-Capillary: 97 mg/dL (ref 70–99)

## 2021-02-10 LAB — MAGNESIUM: Magnesium: 2 mg/dL (ref 1.7–2.4)

## 2021-02-10 MED ORDER — POTASSIUM CHLORIDE CRYS ER 20 MEQ PO TBCR
40.0000 meq | EXTENDED_RELEASE_TABLET | Freq: Once | ORAL | Status: AC
  Administered 2021-02-10: 40 meq via ORAL
  Filled 2021-02-10: qty 2

## 2021-02-10 MED ORDER — ENOXAPARIN SODIUM 40 MG/0.4ML ~~LOC~~ SOLN
40.0000 mg | SUBCUTANEOUS | Status: DC
  Administered 2021-02-10 – 2021-02-11 (×2): 40 mg via SUBCUTANEOUS
  Filled 2021-02-10 (×2): qty 0.4

## 2021-02-10 NOTE — Plan of Care (Signed)

## 2021-02-10 NOTE — Progress Notes (Signed)
Neurosurgery Service Progress Note  Subjective: No acute events overnight, some back soreness this morning, no radicular pain, no new numbness or weakness   Objective: Vitals:   02/09/21 2004 02/09/21 2348 02/10/21 0408 02/10/21 0814  BP: 124/72 109/66 117/68 119/63  Pulse: 89 88 90 89  Resp:  20  16  Temp: 99.2 F (37.3 C) 99 F (37.2 C) 98.6 F (37 C) 99.2 F (37.3 C)  TempSrc: Oral Oral Oral Oral  SpO2: 96% 98% 97% 98%  Weight:      Height:        Physical Exam: Somnolent but interactive, strength 5/5x4, SILTx4, Lumbar incisions healing well with some resolving ecchymoses  Assessment & Plan: 50 y.o. man s/p MIS TLIF with post-op readmission for EtOH w/d  -no change in neurosurgical plan of care -appreciate care from Dr. Florene Glen and the hospitalist service -from my perspective, okay for DVT chemoprophylaxis with SQH tid or prophylactic dose enoxaparin if indicated  Judith Part  02/10/21 11:23 AM

## 2021-02-10 NOTE — Progress Notes (Signed)
PROGRESS NOTE    Charles Santos  OZD:[id number] DOB: 1971/12/03 DOA: 02/06/2021 PCP: Claretta Fraise, MD   Chief Complaint  Patient presents with  . Hallucinations    Brief Narrative:  49 y with Kainen Struckman hx etoh abuse, HT, depression, anxiety, colon cancer s/p subtotal colectomy, emphysema, GERD, and multiple other medical problems who was admitted on 2/59DGLO complicated etoh withdrawal with DT's after lumbar spine surgery on 02/02/21 for spinal stenosis (s/p L4-5 MIS TLIF).  He was initially admitted to the hospitalist service with withdrawal symptoms and hyponatremia, he was transferred to ICU for precedex.  He's been transferred back to [facility] on 2/28.  Hospitalization complicated by ileus.  Pending therapy evaluation.   Assessment & Plan:   Principal Problem:   Acute delirium Active Problems:   Cancer of transverse colon ([place])   [name]   Essential hypertension   Hyponatremia   Hypokalemia   Elevated LFTs  Acute metabolic encephalopathy due to alcohol withdrawal Delirium seems to be improving ciwa - he's now off precedex Thiamine, folate Encourage cessation Normal ammonia, b12, folate  Hyponatremia Continue IVF  Transaminitis, alcoholic hepatitis maddrey discriminant score doesn't indicate need for sterodis Negative acute hepatitis panel Continue to follow  CT abd/pelvis with normal hepatobiliary findings  Ileus CT from 2/25 with dilated fluid and air filled small intestine all the way to the small bowel colon anastomosis - ddx post op ileus vs SBP at small bowel colon anastomosis kUB 2/28 with mild improvement in ileus - he has rectal tube in place, passing stool Advance to clear liquids - will continue to advance as tolearated  Hypokalemia  Replace, follow  Hypoglycemia Follow with diet  Recent lumbar spine surgery Per NSGY  Deconditioning Follow therapy eval  DVT prophylaxis: lovenox Code Status:full  Family Communication: none at  bedside Disposition:   Status is: Inpatient  Remains inpatient appropriate because:Inpatient level of care appropriate due to severity of illness   Dispo: The patient is from: Home              Anticipated d/c is to: Home              Patient currently is not medically stable to d/c.   Difficult to place patient No  Consultants:   Neurology  neurosurgery  Procedures: 2/21 L4-L5 minimally invasive transforaminal lumbar interbody fusion with bilateral L4-L5 pedicle screw placement  Antimicrobials:  Anti-infectives (From admission, onward)   None      Subjective: No new complaints  Objective: Vitals:   02/09/21 2348 02/10/21 0408 02/10/21 0814 02/10/21 1231  BP: 109/66 117/68 119/63 (!) 123/57  Pulse: 88 90 89 79  Resp: 20  16 18   Temp: 99 F (37.2 C) 98.6 F (37 C) 99.2 F (37.3 C) 99.1 F (37.3 C)  TempSrc: Oral Oral Oral Oral  SpO2: 98% 97% 98% 97%  Weight:      Height:        Intake/Output Summary (Last 24 hours) at 02/10/2021 1532 Last data filed at 02/10/2021 0900 Gross per 24 hour  Intake 1210 ml  Output 2400 ml  Net -1190 ml   Filed Weights   02/06/21 1825 02/07/21 0615  Weight: 97.5 kg 97.2 kg    Examination:  General: No acute distress. Cardiovascular: Heart sounds show Abagael Kramm regular rate, and rhythm.  Lungs: Clear to auscultation bilaterally Abdomen: Soft, nontender, mildly distended Neurological: Alert and oriented 3. Moves all extremities 4 . Cranial nerves II through XII grossly intact. Skin: Warm and dry.  No rashes or lesions. Extremities: No clubbing or cyanosis. No edema.   Data Reviewed: I have personally reviewed following labs and imaging studies  CBC: Recent Labs  Lab 02/06/21 1831 02/07/21 0503 02/08/21 0150 02/09/21 0540 02/10/21 0354  WBC 8.8 3.7* 2.1* 3.2* 3.6*  HGB 12.7* 11.6* 11.3* 12.4* 11.5*  HCT 34.9* 32.5* 31.3* 35.8* 34.1*  MCV 95.4 95.9 96.6 97.8 99.1  PLT 175 156 177 236 416    Basic Metabolic  Panel: Recent Labs  Lab 02/06/21 1831 02/07/21 0503 02/07/21 0917 02/08/21 0150 02/08/21 1303 02/08/21 2159 02/09/21 0540 02/10/21 0354 02/10/21 0743  NA 121* 125*   < > 126* 129* 130* 131* 130*  --   K 3.0* 2.9*   < > 3.0* 3.4* 3.5 3.8 3.3*  --   CL 75* 84*   < > 89* 92* 93* 96* 95*  --   CO2 34* 29   < > 28 27 28 25 25   --   GLUCOSE 113* 118*   < > 88 112* 100* 109* 93  --   BUN 10 12   < > 13 15 12 14 10   --   CREATININE 0.90 0.95   < > 0.84 0.96 0.86 0.92 0.96  --   CALCIUM 8.9 8.1*   < > 8.2* 8.5* 8.2* 8.6* 8.6*  --   MG 2.4 2.2  --  2.2  --   --   --   --  2.0   < > = values in this interval not displayed.    GFR: Estimated Creatinine Clearance: 110.1 mL/min (by C-G formula based on SCr of 0.96 mg/dL).  Liver Function Tests: Recent Labs  Lab 02/06/21 1831 02/07/21 0503 02/08/21 0150 02/09/21 0540 02/10/21 0354  AST 330* 260* 177* 123* 80*  ALT 138* 122* 100* 89* 73*  ALKPHOS 87 77 75 81 75  BILITOT 1.8* 2.1* 2.0* 1.3* 1.2  PROT 6.4* 5.6* 5.5* 5.9* 6.1*  ALBUMIN 2.8* 2.4* 2.2* 2.3* 2.3*    CBG: Recent Labs  Lab 02/09/21 2054 02/10/21 0027 02/10/21 0432 02/10/21 0817 02/10/21 1233  GLUCAP 105* 90 97 141* 89     Recent Results (from the past 240 hour(s))  Resp Panel by RT-PCR (Flu Shealynn Saulnier&B, Covid) Nasopharyngeal Swab     Status: None   Collection Time: 02/06/21  1:30 AM   Specimen: Nasopharyngeal Swab; Nasopharyngeal(NP) swabs in vial transport medium  Result Value Ref Range Status   SARS Coronavirus 2 by RT PCR NEGATIVE NEGATIVE Final    Comment: (NOTE) SARS-CoV-2 target nucleic acids are NOT DETECTED.  The SARS-CoV-2 RNA is generally detectable in upper respiratory specimens during the acute phase of infection. The lowest concentration of SARS-CoV-2 viral copies this assay can detect is 138 copies/mL. Deanda Ruddell negative result does not preclude SARS-Cov-2 infection and should not be used as the sole basis for treatment or other patient management  decisions. Erie Sica negative result may occur with  improper specimen collection/handling, submission of specimen other than nasopharyngeal swab, presence of viral mutation(s) within the areas targeted by this assay, and inadequate number of viral copies(<138 copies/mL). Shannin Naab negative result must be combined with clinical observations, patient history, and epidemiological information. The expected result is Negative.  Fact Sheet for Patients:  EntrepreneurPulse.com.au  Fact Sheet for Healthcare Providers:  IncredibleEmployment.be  This test is no t yet approved or cleared by the [place] FDA and  has been authorized for detection and/or diagnosis of SARS-CoV-2 by FDA under an Emergency Use Authorization (  EUA). This EUA will remain  in effect (meaning this test can be used) for the duration of the COVID-19 declaration under Section 564(b)(1) of the Act, 21 U.S.C.section 360bbb-3(b)(1), unless the authorization is terminated  or revoked sooner.       Influenza Arelyn Gauer by PCR NEGATIVE NEGATIVE Final   Influenza B by PCR NEGATIVE NEGATIVE Final    Comment: (NOTE) The Xpert Xpress SARS-CoV-2/FLU/RSV plus assay is intended as an aid in the diagnosis of influenza from Nasopharyngeal swab specimens and should not be used as Harryette Shuart sole basis for treatment. Nasal washings and aspirates are unacceptable for Xpert Xpress SARS-CoV-2/FLU/RSV testing.  Fact Sheet for Patients: EntrepreneurPulse.com.au  Fact Sheet for Healthcare Providers: IncredibleEmployment.be  This test is not yet approved or cleared by the [place] FDA and has been authorized for detection and/or diagnosis of SARS-CoV-2 by FDA under an Emergency Use Authorization (EUA). This EUA will remain in effect (meaning this test can be used) for the duration of the COVID-19 declaration under Section 564(b)(1) of the Act, 21 U.S.C. section 360bbb-3(b)(1), unless the  authorization is terminated or revoked.  Performed at [facility], [place] [address], [place], [place] [address]   Blood culture (routine x 2)     Status: None (Preliminary result)   Collection Time: 02/06/21 10:29 PM   Specimen: BLOOD  Result Value Ref Range Status   Specimen Description BLOOD SITE NOT SPECIFIED  Final   Special Requests   Final    BOTTLES DRAWN AEROBIC AND ANAEROBIC Blood Culture adequate volume   Culture   Final    NO GROWTH 4 DAYS Performed at [facility], [address], [place], [place] [address]    Report Status PENDING  Incomplete  Blood culture (routine x 2)     Status: None (Preliminary result)   Collection Time: 02/06/21 10:44 PM   Specimen: BLOOD  Result Value Ref Range Status   Specimen Description BLOOD SITE NOT SPECIFIED  Final   Special Requests   Final    BOTTLES DRAWN AEROBIC AND ANAEROBIC Blood Culture results may not be optimal due to an inadequate volume of blood received in culture bottles   Culture   Final    NO GROWTH 4 DAYS Performed at [facility], [place] [address], [place], [place] [address]    Report Status PENDING  Incomplete         Radiology Studies: DG Abd 1 View  Result Date: 02/10/2021 CLINICAL DATA:  Follow-up ileus EXAM: ABDOMEN - 1 VIEW COMPARISON:  02/09/2021 FINDINGS: Scattered large and small bowel gas is noted relatively stable from the prior exam consistent with Eltha Tingley generalized small bowel ileus. No free air is seen. No abnormal mass is noted. Postsurgical changes are seen. IMPRESSION: Stable small-bowel ileus. Electronically Signed   By: Inez Catalina M.D.   On: 02/10/2021 08:39   DG Abd 1 View  Result Date: 02/09/2021 CLINICAL DATA:  Patient status post L4-5 fusion 02/02/2021. Patient admitted to the hospital with delirium 02/03/2021. Abdominal discomfort. EXAM: ABDOMEN - 1 VIEW COMPARISON:  Single-view of the abdomen 02/08/2021 and 02/07/2021. FINDINGS: There has been continued improvement and mild  gaseous distention of small bowel. There is some gas in the ascending colon. IMPRESSION: Mild improvement in ileus.  No new abnormality. Electronically Signed   By: Inge Rise M.D.   On: 02/09/2021 15:02        Scheduled Meds: . Chlorhexidine Gluconate Cloth  6 each Topical Daily  .  enoxaparin (LOVENOX) injection  40 mg Subcutaneous Q24H  . folic acid  1 mg Oral Daily  . LORazepam  0-4 mg Intravenous Q8H  . [START ON 02/17/2021] thiamine  100 mg Oral Daily   Continuous Infusions: . sodium chloride 75 mL/hr at 02/10/21 0900  . thiamine injection 500 mg (02/10/21 0521)   Followed by  . [START ON 02/12/2021] thiamine injection       LOS: 3 days    Time spent: over 62 min    Fayrene Helper, MD [facility]   To contact the attending provider between 7A-7P or the covering provider during after hours 7P-7A, please log into the web site [email or web address] and access using universal [place] password for that web site. If you do not have the password, please call the hospital operator.  02/10/2021, 3:32 PM

## 2021-02-10 NOTE — Evaluation (Signed)
Physical Therapy Evaluation Patient Details Name: Charles Santos MRN: [id number] DOB: June 07, 1971 Today's Date: 02/10/2021   History of Present Illness  Pt is 66 y with a hx etoh abuse, HT, depression, anxiety, colon cancer s/p subtotal colectomy, emphysema, GERD, and multiple other medical problems who was admitted on 0/86 with complicated etoh withdrawal with DT's after lumbar spine surgery on 02/02/21 for spinal stenosis (s/p L4-5 MIS TLIF).  He was initially admitted to the hospitalist service with withdrawal symptoms and hyponatremia, he was transferred to ICU for precedex.  He's been transferred back to [facility] on 2/28.  Hospitalization complicated by ileus.  Clinical Impression  Pt admitted with above diagnosis. Pt presenting with decreased mobility, endurance, safety, and balance.  He was able to follow back precautions without cues but not recall.  Pt with rectal tube that was leaking that required increased time, transfers, and limited mobility.  He did ambulate 200' with RW and min guard. Pt expected to progress well with therapy.  Pt currently with functional limitations due to the deficits listed below (see PT Problem List). Pt will benefit from skilled PT to increase their independence and safety with mobility to allow discharge to the venue listed below.       Follow Up Recommendations No PT follow up;Supervision for mobility/OOB    Equipment Recommendations  Rolling Netzel with 5" wheels    Recommendations for Other Services       Precautions / Restrictions Precautions Precautions: Back Precaution Comments: Reviewed back precautions from recent admissions - pt demonstrated without cues      Mobility  Bed Mobility Overal bed mobility: Needs Assistance Bed Mobility: Rolling;Sidelying to Sit;Sit to Sidelying Rolling: Supervision Sidelying to sit: Supervision     Sit to sidelying: Supervision General bed mobility comments: Increased time and use of rails, good log roll without  cues.  Multiple rolls in bed for ADLs.    Transfers Overall transfer level: Needs assistance Equipment used: Rolling Batte (2 wheeled) Transfers: Sit to/from Stand Sit to Stand: Min guard         General transfer comment: Performed x 4 with cues for safe hand placement; increased time and effort but able to do without assist  Ambulation/Gait Ambulation/Gait assistance: Min guard Gait Distance (Feet): 200 Feet Assistive device: Rolling Sancho (2 wheeled) Gait Pattern/deviations: Step-through pattern;Decreased stride length Gait velocity: Decreased   General Gait Details: Min cues for posture and RW proximity  Science writer    Modified Rankin (Stroke Patients Only)       Balance Overall balance assessment: Needs assistance Sitting-balance support: No upper extremity supported Sitting balance-Leahy Scale: Good     Standing balance support: No upper extremity supported;During functional activity;Bilateral upper extremity supported Standing balance-Leahy Scale: Fair Standing balance comment: RW for ambulation but able to stand briefly without AD                             Pertinent Vitals/Pain Pain Assessment: 0-10 Pain Score: 5  Pain Location: back Pain Descriptors / Indicators: Constant;Sore Pain Intervention(s): Limited activity within patient's tolerance;Monitored during session;Repositioned    Home Living Family/patient expects to be discharged to:: Private residence Living Arrangements: Children Available Help at Discharge: Family;Available 24 hours/day Type of Home: Apartment Home Access: Level entry     Home Layout: One level Home Equipment: None Additional Comments: pt has access to [place]    Prior  Function Level of Independence: Independent         Comments: Driving and working full-time at a [employer] [place] [employer] but did have pain.  Pt had back surgery 1 week ago and since that time has been  ambulatory in home with RW.     Hand Dominance   Dominant Hand: Right    Extremity/Trunk Assessment   Upper Extremity Assessment Upper Extremity Assessment: Overall WFL for tasks assessed    Lower Extremity Assessment Lower Extremity Assessment: RLE deficits/detail;Generalized weakness (generalized weakness s/p recent lumbar sx) RLE Deficits / Details: Reports mild numbness R LE    Cervical / Trunk Assessment Cervical / Trunk Exceptions: recent spinal sx; no brace needed per last admission  Communication   Communication: No difficulties  Cognition Arousal/Alertness: Awake/alert Behavior During Therapy: WFL for tasks assessed/performed Overall Cognitive Status: Within Functional Limits for tasks assessed                                        General Comments General comments (skin integrity, edema, etc.): Pt with rectal tube - tube was leaking when pt in supine and with ambulation - donned brief for ambulation.  Pt stated back precautions as not twisting, lifting or pulling.  But he followed back precautions without cues.  Educated on No bending, lifting, twisting.   Exercises     Assessment/Plan    PT Assessment Patient needs continued PT services  PT Problem List Decreased strength;Decreased activity tolerance;Decreased balance;Decreased mobility;Decreased knowledge of use of DME;Decreased safety awareness;Decreased knowledge of precautions;Pain       PT Treatment Interventions DME instruction;Gait training;Functional mobility training;Therapeutic activities;Therapeutic exercise;Neuromuscular re-education;Patient/family education;Balance training    PT Goals (Current goals can be found in the Care Plan section)  Acute Rehab PT Goals Patient Stated Goal: Return home PT Goal Formulation: With patient Time For Goal Achievement: 02/24/21 Potential to Achieve Goals: Good    Frequency Min 3X/week   Barriers to discharge        Co-evaluation                AM-PAC PT "6 Clicks" Mobility  Outcome Measure Help needed turning from your back to your side while in a flat bed without using bedrails?: None Help needed moving from lying on your back to sitting on the side of a flat bed without using bedrails?: A Little Help needed moving to and from a bed to a chair (including a wheelchair)?: A Little Help needed standing up from a chair using your arms (e.g., wheelchair or bedside chair)?: A Little Help needed to walk in hospital room?: A Little Help needed climbing 3-5 steps with a railing? : A Little 6 Click Score: 19    End of Session Equipment Utilized During Treatment: Gait belt Activity Tolerance: Patient tolerated treatment well Patient left: in bed;with call bell/phone within reach;with bed alarm set Nurse Communication: Mobility status (rectal tube leaking) PT Visit Diagnosis: Unsteadiness on feet (R26.81);Pain    Time: 4650-3546 PT Time Calculation (min) (ACUTE ONLY): 43 min   Charges:   PT Evaluation $PT Eval Moderate Complexity: 1 Mod PT Treatments $Gait Training: 8-22 mins $Therapeutic Activity: 8-22 mins        Abran Richard, PT Acute Rehab Services Pager [phone] [facility] 02/10/2021, 4:20 PM

## 2021-02-11 ENCOUNTER — Inpatient Hospital Stay (HOSPITAL_COMMUNITY): Payer: 59

## 2021-02-11 LAB — GLUCOSE, CAPILLARY
Glucose-Capillary: 100 mg/dL — ABNORMAL HIGH (ref 70–99)
Glucose-Capillary: 106 mg/dL — ABNORMAL HIGH (ref 70–99)
Glucose-Capillary: 108 mg/dL — ABNORMAL HIGH (ref 70–99)
Glucose-Capillary: 114 mg/dL — ABNORMAL HIGH (ref 70–99)
Glucose-Capillary: 122 mg/dL — ABNORMAL HIGH (ref 70–99)
Glucose-Capillary: 99 mg/dL (ref 70–99)

## 2021-02-11 LAB — COMPREHENSIVE METABOLIC PANEL
ALT: 61 U/L — ABNORMAL HIGH (ref 0–44)
AST: 60 U/L — ABNORMAL HIGH (ref 15–41)
Albumin: 2.4 g/dL — ABNORMAL LOW (ref 3.5–5.0)
Alkaline Phosphatase: 73 U/L (ref 38–126)
Anion gap: 10 (ref 5–15)
BUN: 8 mg/dL (ref 6–20)
CO2: 21 mmol/L — ABNORMAL LOW (ref 22–32)
Calcium: 8.7 mg/dL — ABNORMAL LOW (ref 8.9–10.3)
Chloride: 100 mmol/L (ref 98–111)
Creatinine, Ser: 0.85 mg/dL (ref 0.61–1.24)
GFR, Estimated: >60 mL/min (ref >60)
Glucose, Bld: 88 mg/dL (ref 70–99)
Potassium: 3.6 mmol/L (ref 3.5–5.1)
Sodium: 131 mmol/L — ABNORMAL LOW (ref 135–145)
Total Bilirubin: 1.2 mg/dL (ref 0.3–1.2)
Total Protein: 6.2 g/dL — ABNORMAL LOW (ref 6.5–8.1)

## 2021-02-11 LAB — CBC WITH DIFFERENTIAL/PLATELET
Abs Immature Granulocytes: 0.2 10*3/uL — ABNORMAL HIGH (ref 0.00–0.07)
Basophils Absolute: 0.1 10*3/uL (ref 0.0–0.1)
Basophils Relative: 1 %
Eosinophils Absolute: 0.1 10*3/uL (ref 0.0–0.5)
Eosinophils Relative: 2 %
HCT: 34.9 % — ABNORMAL LOW (ref 39.0–52.0)
Hemoglobin: 11.7 g/dL — ABNORMAL LOW (ref 13.0–17.0)
Immature Granulocytes: 4 %
Lymphocytes Relative: 19 %
Lymphs Abs: 1 10*3/uL (ref 0.7–4.0)
MCH: 33.3 pg (ref 26.0–34.0)
MCHC: 33.5 g/dL (ref 30.0–36.0)
MCV: 99.4 fL (ref 80.0–100.0)
Monocytes Absolute: 1.2 10*3/uL — ABNORMAL HIGH (ref 0.1–1.0)
Monocytes Relative: 23 %
Neutro Abs: 2.7 10*3/uL (ref 1.7–7.7)
Neutrophils Relative %: 51 %
Platelets: 298 10*3/uL (ref 150–400)
RBC: 3.51 MIL/uL — ABNORMAL LOW (ref 4.22–5.81)
RDW: 13.9 % (ref 11.5–15.5)
WBC: 5.3 10*3/uL (ref 4.0–10.5)
nRBC: 0 % (ref 0.0–0.2)

## 2021-02-11 LAB — CULTURE, BLOOD (ROUTINE X 2)
Culture: NO GROWTH
Culture: NO GROWTH
Special Requests: ADEQUATE

## 2021-02-11 LAB — PHOSPHORUS: Phosphorus: 4.2 mg/dL (ref 2.5–4.6)

## 2021-02-11 LAB — MAGNESIUM: Magnesium: 2 mg/dL (ref 1.7–2.4)

## 2021-02-11 NOTE — Progress Notes (Signed)
PROGRESS NOTE  Craven Crean NAT:[id number] DOB: 1971-01-02 DOA: 02/06/2021 PCP: Claretta Fraise, MD   LOS: 4 days   Brief Narrative / Interim history: 53 y with a hx etoh abuse, HT, depression, anxiety, colon cancer s/p subtotal colectomy, emphysema, GERD, and multiple other medical problems who was admitted on 4/27[facility] complicated etoh withdrawal with DT's after lumbar spine surgery on 02/02/21 for spinal stenosis (s/p L4-5 MIS TLIF).  He was initially admitted to the hospitalist service with withdrawal symptoms and hyponatremia, he was transferred to ICU for precedex.  He's been transferred back to [facility] on 2/28.  Hospitalization complicated by ileus.   Subjective / 24h Interval events: Feeling well this morning, asking about getting the Flexi-Seal and the Foley catheter out  Assessment & Plan: Principal Problem Acute metabolic encephalopathy due to alcohol withdrawal -Patient with significant alcohol withdrawal, requiring Precedex at 1 point and needed to stay in the ICU.  He was much improved, off Precedex, close to his baseline and seems to be alert and oriented x4 this morning.  Has good insight and is determined to quit drinking  Active Problems Hyponatremia -slowly improving, patient eating well, will advance diet, stop IV fluids  Transaminitis, alcoholic hepatitis -maddrey discriminant score doesn't indicate need for steroids. Negative acute hepatitis panel -CT abd/pelvis with normal hepatobiliary findings  Ileus -CT from 2/25 with dilated fluid and air filled small intestine all the way to the small bowel colon anastomosis - ddx post op ileus vs SBP at small bowel colon anastomosis.  Repeat KUB 2/20 weight with improvement.  Continue to advance diet today, no nausea or vomiting, having bowel movements.  Chronic diarrhea-patient tells me that this is ongoing since his colectomy.  Feels like he is at baseline.  Discontinue Flexi-Seal  Urinary retention-voiding trial today, he is now  more ambulatory  Hypokalemia- Replace, follow  Hypoglycemia - Follow with diet  Recent lumbar spine surgery- Per NSGY  Advance diet, discontinue Flexi-Seal, Foley, anticipate home 24-48 hours if continues to improve clinically   Scheduled Meds: . Chlorhexidine Gluconate Cloth  6 each Topical Daily  . enoxaparin (LOVENOX) injection  40 mg Subcutaneous Q24H  . folic acid  1 mg Oral Daily  . [START ON 02/17/2021] thiamine  100 mg Oral Daily   Continuous Infusions: . sodium chloride 75 mL/hr at 02/11/21 0136  . thiamine injection 500 mg (02/11/21 0659)   Followed by  . [START ON 02/12/2021] thiamine injection     PRN Meds:.labetalol, oxyCODONE  Diet Orders (From admission, onward)    Start     Ordered   02/11/21 0747  DIET SOFT Room service appropriate? Yes; Fluid consistency: Thin  Diet effective now       Question Answer Comment  Room service appropriate? Yes   Fluid consistency: Thin      02/11/21 0746          DVT prophylaxis: enoxaparin (LOVENOX) injection 40 mg Start: 02/10/21 1700 SCDs Start: 02/07/21 0427     Code Status: Full Code  Family Communication: no family at bedside   Status is: Inpatient  Remains inpatient appropriate because:Inpatient level of care appropriate due to severity of illness   Dispo: The patient is from: Home              Anticipated d/c is to: Home              Patient currently is not medically stable to d/c.   Difficult to place patient No  Level of care: Progressive  Consultants:  Neurosurgery   Procedures:  2/21 L4-L51minimally invasive transforaminal lumbar interbody fusion with bilateral L4-L5pedicle screw placement  Microbiology  None   Antimicrobials: None     Objective: Vitals:   02/10/21 2014 02/10/21 2324 02/11/21 0359 02/11/21 0807  BP: 111/80 131/70 116/78 115/65  Pulse: 82 83 83 86  Resp:  19 20 18   Temp: 99.9 F (37.7 C) (!) 100.4 F (38 C) 99.9 F (37.7 C) 99.1 F (37.3 C)  TempSrc: Oral  Oral Oral Oral  SpO2: 98% 98% 96% 96%  Weight:      Height:        Intake/Output Summary (Last 24 hours) at 02/11/2021 1008 Last data filed at 02/11/2021 0800 Gross per 24 hour  Intake 2334.19 ml  Output 3400 ml  Net -1065.81 ml   Filed Weights   02/06/21 1825 02/07/21 0615  Weight: 97.5 kg 97.2 kg    Examination:  Constitutional: NAD Eyes: no scleral icterus ENMT: Mucous membranes are moist.  Neck: normal, supple Respiratory: clear to auscultation bilaterally, no wheezing, no crackles. Normal respiratory effort. No accessory muscle use.  Cardiovascular: Regular rate and rhythm, no murmurs / rubs / gallops. No LE edema. Good peripheral pulses Abdomen: non distended, no tenderness. Bowel sounds positive.  Musculoskeletal: no clubbing / cyanosis.  Skin: no rashes Neurologic: CN 2-12 grossly intact. Strength 5/5 in all 4.  Psychiatric: Normal judgment and insight. Alert and oriented x 3. Normal mood.    Data Reviewed: I have independently reviewed following labs and imaging studies   CBC: Recent Labs  Lab 02/07/21 0503 02/08/21 0150 02/09/21 0540 02/10/21 0354 02/11/21 0317  WBC 3.7* 2.1* 3.2* 3.6* 5.3  NEUTROABS  --   --   --   --  2.7  HGB 11.6* 11.3* 12.4* 11.5* 11.7*  HCT 32.5* 31.3* 35.8* 34.1* 34.9*  MCV 95.9 96.6 97.8 99.1 99.4  PLT 156 177 236 264 778   Basic Metabolic Panel: Recent Labs  Lab 02/06/21 1831 02/07/21 0503 02/07/21 0917 02/08/21 0150 02/08/21 1303 02/08/21 2159 02/09/21 0540 02/10/21 0354 02/10/21 0743 02/11/21 0317  NA 121* 125*   < > 126* 129* 130* 131* 130*  --  131*  K 3.0* 2.9*   < > 3.0* 3.4* 3.5 3.8 3.3*  --  3.6  CL 75* 84*   < > 89* 92* 93* 96* 95*  --  100  CO2 34* 29   < > 28 27 28 25 25   --  21*  GLUCOSE 113* 118*   < > 88 112* 100* 109* 93  --  88  BUN 10 12   < > 13 15 12 14 10   --  8  CREATININE 0.90 0.95   < > 0.84 0.96 0.86 0.92 0.96  --  0.85  CALCIUM 8.9 8.1*   < > 8.2* 8.5* 8.2* 8.6* 8.6*  --  8.7*  MG 2.4 2.2   --  2.2  --   --   --   --  2.0 2.0  PHOS  --   --   --   --   --   --   --   --   --  4.2   < > = values in this interval not displayed.   Liver Function Tests: Recent Labs  Lab 02/07/21 0503 02/08/21 0150 02/09/21 0540 02/10/21 0354 02/11/21 0317  AST 260* 177* 123* 80* 60*  ALT 122* 100* 89* 73* 61*  ALKPHOS 77 75 81 75 73  BILITOT 2.1* 2.0* 1.3* 1.2 1.2  PROT 5.6* 5.5* 5.9* 6.1* 6.2*  ALBUMIN 2.4* 2.2* 2.3* 2.3* 2.4*   Coagulation Profile: Recent Labs  Lab 02/06/21 2229  INR 1.0   HbA1C: No results for input(s): HGBA1C in the last 72 hours. CBG: Recent Labs  Lab 02/10/21 1542 02/10/21 2014 02/10/21 2322 02/11/21 0426 02/11/21 0809  GLUCAP 116* 108* 97 99 106*    Recent Results (from the past 240 hour(s))  Resp Panel by RT-PCR (Flu A&B, Covid) Nasopharyngeal Swab     Status: None   Collection Time: 02/06/21  1:30 AM   Specimen: Nasopharyngeal Swab; Nasopharyngeal(NP) swabs in vial transport medium  Result Value Ref Range Status   SARS Coronavirus 2 by RT PCR NEGATIVE NEGATIVE Final    Comment: (NOTE) SARS-CoV-2 target nucleic acids are NOT DETECTED.  The SARS-CoV-2 RNA is generally detectable in upper respiratory specimens during the acute phase of infection. The lowest concentration of SARS-CoV-2 viral copies this assay can detect is 138 copies/mL. A negative result does not preclude SARS-Cov-2 infection and should not be used as the sole basis for treatment or other patient management decisions. A negative result may occur with  improper specimen collection/handling, submission of specimen other than nasopharyngeal swab, presence of viral mutation(s) within the areas targeted by this assay, and inadequate number of viral copies(<138 copies/mL). A negative result must be combined with clinical observations, patient history, and epidemiological information. The expected result is Negative.  Fact Sheet for Patients:   EntrepreneurPulse.com.au  Fact Sheet for Healthcare Providers:  IncredibleEmployment.be  This test is no t yet approved or cleared by the [place] FDA and  has been authorized for detection and/or diagnosis of SARS-CoV-2 by FDA under an Emergency Use Authorization (EUA). This EUA will remain  in effect (meaning this test can be used) for the duration of the COVID-19 declaration under Section 564(b)(1) of the Act, 21 U.S.C.section 360bbb-3(b)(1), unless the authorization is terminated  or revoked sooner.       Influenza A by PCR NEGATIVE NEGATIVE Final   Influenza B by PCR NEGATIVE NEGATIVE Final    Comment: (NOTE) The Xpert Xpress SARS-CoV-2/FLU/RSV plus assay is intended as an aid in the diagnosis of influenza from Nasopharyngeal swab specimens and should not be used as a sole basis for treatment. Nasal washings and aspirates are unacceptable for Xpert Xpress SARS-CoV-2/FLU/RSV testing.  Fact Sheet for Patients: EntrepreneurPulse.com.au  Fact Sheet for Healthcare Providers: IncredibleEmployment.be  This test is not yet approved or cleared by the [place] FDA and has been authorized for detection and/or diagnosis of SARS-CoV-2 by FDA under an Emergency Use Authorization (EUA). This EUA will remain in effect (meaning this test can be used) for the duration of the COVID-19 declaration under Section 564(b)(1) of the Act, 21 U.S.C. section 360bbb-3(b)(1), unless the authorization is terminated or revoked.  Performed at [facility], [place] [address] [place], [place] [address]   Blood culture (routine x 2)     Status: None (Preliminary result)   Collection Time: 02/06/21 10:29 PM   Specimen: BLOOD  Result Value Ref Range Status   Specimen Description BLOOD SITE NOT SPECIFIED  Final   Special Requests   Final    BOTTLES DRAWN AEROBIC AND ANAEROBIC Blood Culture adequate volume   Culture    Final    NO GROWTH 4 DAYS Performed at [facility], [address], [place], [place] [address]    Report Status PENDING  Incomplete  Blood culture (routine x 2)     Status: None (Preliminary result)   Collection Time: 02/06/21 10:44 PM   Specimen: BLOOD  Result Value Ref Range Status   Specimen Description BLOOD SITE NOT SPECIFIED  Final   Special Requests   Final    BOTTLES DRAWN AEROBIC AND ANAEROBIC Blood Culture results may not be optimal due to an inadequate volume of blood received in culture bottles   Culture   Final    NO GROWTH 4 DAYS Performed at [facility], [place] [address], [place], [place] [address]    Report Status PENDING  Incomplete     Radiology Studies: No results found.  Marzetta Board, MD, PhD [facility]  Between 7 am - 7 pm I am available, please contact me via Amion or Securechat  Between 7 pm - 7 am I am not available, please contact night coverage MD/APP via Amion

## 2021-02-11 NOTE — TOC Initial Note (Signed)
Transition of Care [facility]) - Initial/Assessment Note    Patient Details  Name: Charles Santos MRN: [id number] Date of Birth: 12-Jun-1971  Transition of Care [facility]) CM/SW Contact:    Pollie Friar, RN Phone Number: 02/11/2021, 3:04 PM  Clinical Narrative:                 Pt states he lives with his son that works during the day. CM informed him that therapy feels he needs supervision when up walking. He states he can stay during the day at his daughters home and evening/night with son. [place] for home ordered through [employer] and will be delivered to the room. Pt has transport home when medically ready.  Expected Discharge Plan: Home/Self Care Barriers to Discharge: Continued Medical Work up   Patient Goals and CMS Choice     Choice offered to / list presented to : Patient  Expected Discharge Plan and Services Expected Discharge Plan: Home/Self Care   Discharge Planning Services: CM Consult Post Acute Care Choice: Durable Medical Equipment Living arrangements for the past 2 months: Single Family Home                 DME Arranged: [place] rolling DME Agency: [employer] Date DME Agency Contacted: 02/11/21   Representative spoke with at DME Agency: Freda Munro            Prior Living Arrangements/Services Living arrangements for the past 2 months: Single Family Home Lives with:: Adult Children Patient language and need for interpreter reviewed:: Yes Do you feel safe going back to the place where you live?: Yes      Need for Family Participation in Patient Care: Yes (Comment) Care giver support system in place?: Yes (comment)   Criminal Activity/Legal Involvement Pertinent to Current Situation/Hospitalization: No - Comment as needed  Activities of Daily Living      Permission Sought/Granted                  Emotional Assessment Appearance:: Appears stated age Attitude/Demeanor/Rapport: Engaged Affect (typically observed): Accepting Orientation: : Oriented to  Self,Oriented to Place,Oriented to  Time,Oriented to Situation Alcohol / Substance Use: Alcohol Use Psych Involvement: No (comment)  Admission diagnosis:  Hallucinations [R44.3] Acute delirium [R41.0] Back pain [M54.9] Hyponatremia [E87.1] Patient Active Problem List   Diagnosis Date Noted  . Acute delirium 02/07/2021  . Elevated LFTs 02/07/2021  . Lumbar radiculopathy 02/02/2021  . Hospital discharge follow-up 11/24/2020  . CAP (community acquired pneumonia) 11/07/2020  . Sepsis ([place]) 11/07/2020  . Hyponatremia 11/07/2020  . Hypokalemia 11/07/2020  . Foraminal stenosis of lumbar region 12/28/2019  . Lumbar spondylolysis 12/28/2019  . Radiculopathy of lumbosacral region 12/05/2019  . Essential hypertension 09/26/2019  . Chronic right-sided low back pain with right-sided sciatica 09/26/2019  . Recurrent right inguinal hernia 06/26/2019  . Hypertriglyceridemia 11/24/2018  . Anxiety 02/16/2018  . Elevated CEA   . [name]   . Cancer of transverse colon ([place]) 09/17/2016  . S/P colonoscopic polypectomy   . Hematochezia   . Depression 06/01/2016  . Alcohol abuse, in remission 06/01/2016  . Tobacco abuse 06/01/2016  . Vitamin D deficiency 10/17/2015  . Hyperlipidemia 10/17/2015  . History of malignant neoplasm of colon without staging 09/22/2010   PCP:  Claretta Fraise, MD Pharmacy:   [facility], [place] [address] Phone: [phone] Fax: [phone]     Social Determinants of Health (SDOH) Interventions    Readmission Risk  Interventions No flowsheet data found.

## 2021-02-11 NOTE — TOC CAGE-AID Note (Signed)
Transition of Care [facility]) - CAGE-AID Screening   Patient Details  Name: Charles Santos MRN: [id number] Date of Birth: 12/29/1970  [facility]) CM/SW Contact:    Pollie Friar, RN Phone Number: 02/11/2021, 3:04 PM   Clinical Narrative: Patient provided resources for inpatient and outpatient alcohol counseling.   CAGE-AID Screening:    Have You Ever Felt You Ought to Cut Down on Your Drinking or Drug Use?: Yes   Have You Felt Bad Or Guilty About Your Drinking Or Drug Use?: Yes Have You Ever Had a Drink or Used Drugs First Thing In The Morning to Steady Your Nerves or to Get Rid of a Hangover?: No    Substance Abuse Education Offered: Yes  Substance abuse interventions: Scientist, clinical (histocompatibility and immunogenetics)

## 2021-02-11 NOTE — Plan of Care (Signed)

## 2021-02-12 LAB — BASIC METABOLIC PANEL
Anion gap: 11 (ref 5–15)
BUN: 13 mg/dL (ref 6–20)
CO2: 24 mmol/L (ref 22–32)
Calcium: 9.2 mg/dL (ref 8.9–10.3)
Chloride: 98 mmol/L (ref 98–111)
Creatinine, Ser: 1.11 mg/dL (ref 0.61–1.24)
GFR, Estimated: >60 mL/min (ref >60)
Glucose, Bld: 110 mg/dL — ABNORMAL HIGH (ref 70–99)
Potassium: 3.7 mmol/L (ref 3.5–5.1)
Sodium: 133 mmol/L — ABNORMAL LOW (ref 135–145)

## 2021-02-12 LAB — CBC
HCT: 36.6 % — ABNORMAL LOW (ref 39.0–52.0)
Hemoglobin: 12.2 g/dL — ABNORMAL LOW (ref 13.0–17.0)
MCH: 33.2 pg (ref 26.0–34.0)
MCHC: 33.3 g/dL (ref 30.0–36.0)
MCV: 99.5 fL (ref 80.0–100.0)
Platelets: 361 10*3/uL (ref 150–400)
RBC: 3.68 MIL/uL — ABNORMAL LOW (ref 4.22–5.81)
RDW: 13.8 % (ref 11.5–15.5)
WBC: 6.7 10*3/uL (ref 4.0–10.5)
nRBC: 0 % (ref 0.0–0.2)

## 2021-02-12 LAB — GLUCOSE, CAPILLARY
Glucose-Capillary: 112 mg/dL — ABNORMAL HIGH (ref 70–99)
Glucose-Capillary: 104 mg/dL — ABNORMAL HIGH (ref 70–99)
Glucose-Capillary: 98 mg/dL (ref 70–99)

## 2021-02-12 NOTE — Discharge Instructions (Signed)
Follow with Claretta Fraise, MD in 5-7 days  Please get a complete blood count and chemistry panel checked by your Primary MD at your next visit, and again as instructed by your Primary MD. Please get your medications reviewed and adjusted by your Primary MD.  Please request your Primary MD to go over all Hospital Tests and Procedure/Radiological results at the follow up, please get all Hospital records sent to your Prim MD by signing hospital release before you go home.  In some cases, there will be blood work, cultures and biopsy results pending at the time of your discharge. Please request that your primary care M.D. goes through all the records of your hospital data and follows up on these results.  If you had Pneumonia of Lung problems at the Hospital: Please get a 2 view Chest X ray done in 6-8 weeks after hospital discharge or sooner if instructed by your Primary MD.  If you have Congestive Heart Failure: Please call your Cardiologist or Primary MD anytime you have any of the following symptoms:  1) 3 pound weight gain in 24 hours or 5 pounds in 1 week  2) shortness of breath, with or without a dry hacking cough  3) swelling in the hands, feet or stomach  4) if you have to sleep on extra pillows at night in order to breathe  Follow cardiac low salt diet and 1.5 lit/day fluid restriction.  If you have diabetes Accuchecks 4 times/day, Once in AM empty stomach and then before each meal. Log in all results and show them to your primary doctor at your next visit. If any glucose reading is under 80 or above 300 call your primary MD immediately.  If you have Seizure/Convulsions/Epilepsy: Please do not drive, operate heavy machinery, participate in activities at heights or participate in high speed sports until you have seen by Primary MD or a Neurologist and advised to do so again. Per [facility] statutes, patients with seizures are not allowed to drive until they have been  seizure-free for six months.  Use caution when using heavy equipment or power tools. Avoid working on ladders or at heights. Take showers instead of baths. Ensure the water temperature is not too high on the home water heater. Do not go swimming alone. Do not lock yourself in a room alone (i.e. bathroom). When caring for infants or small children, sit down when holding, feeding, or changing them to minimize risk of injury to the child in the event you have a seizure. Maintain good sleep hygiene. Avoid alcohol.   If you had Gastrointestinal Bleeding: Please ask your Primary MD to check a complete blood count within one week of discharge or at your next visit. Your endoscopic/colonoscopic biopsies that are pending at the time of discharge, will also need to followed by your Primary MD.  Get Medicines reviewed and adjusted. Please take all your medications with you for your next visit with your Primary MD  Please request your Primary MD to go over all hospital tests and procedure/radiological results at the follow up, please ask your Primary MD to get all Hospital records sent to his/her office.  If you experience worsening of your admission symptoms, develop shortness of breath, life threatening emergency, suicidal or homicidal thoughts you must seek medical attention immediately by calling 911 or calling your MD immediately  if symptoms less severe.  You must read complete instructions/literature along with all the possible adverse reactions/side effects for all the Medicines you take  and that have been prescribed to you. Take any new Medicines after you have completely understood and accpet all the possible adverse reactions/side effects.   Do not drive or operate heavy machinery when taking Pain medications.   Do not take more than prescribed Pain, Sleep and Anxiety Medications  Special Instructions: If you have smoked or chewed Tobacco  in the last 2 yrs please stop smoking, stop any regular  Alcohol  and or any Recreational drug use.  Wear Seat belts while driving.  Please note You were cared for by a hospitalist during your hospital stay. If you have any questions about your discharge medications or the care you received while you were in the hospital after you are discharged, you can call the unit and asked to speak with the hospitalist on call if the hospitalist that took care of you is not available. Once you are discharged, your primary care physician will handle any further medical issues. Please note that NO REFILLS for any discharge medications will be authorized once you are discharged, as it is imperative that you return to your primary care physician (or establish a relationship with a primary care physician if you do not have one) for your aftercare needs so that they can reassess your need for medications and monitor your lab values.  You can reach the hospitalist office at phone [phone] or fax [phone]   If you do not have a primary care physician, you can call [phone] for a physician referral.  Activity: As tolerated with Full fall precautions use Mccarry/cane & assistance as needed    Diet: regular  Disposition Home

## 2021-02-12 NOTE — Progress Notes (Signed)
Physical Therapy Treatment Patient Details Name: Charles Santos MRN: [id number] DOB: 05/16/1971 Today's Date: 02/12/2021    History of Present Illness Pt is 55 y with a hx etoh abuse, HT, depression, anxiety, colon cancer s/p subtotal colectomy, emphysema, GERD, and multiple other medical problems who was admitted on 8/29 with complicated etoh withdrawal with DT's after lumbar spine surgery on 02/02/21 for spinal stenosis (s/p L4-5 MIS TLIF).  He was initially admitted to the hospitalist service with withdrawal symptoms and hyponatremia, he was transferred to ICU for precedex.  He's been transferred back to [facility] on 2/28.  Hospitalization complicated by ileus.    PT Comments    Patient progressing well towards physical therapy goals. Patient ambulated 200' with min guard initially progressing to supervision with RW, cues required for RW proximity. Adjusted personal RW for patient to return home with. Patient continues to be limited by decreased activity tolerance and weakness. No PT follow up recommended at this time.     Follow Up Recommendations  No PT follow up;Supervision for mobility/OOB     Equipment Recommendations  Rolling Wilcher with 5" wheels    Recommendations for Other Services       Precautions / Restrictions Precautions Precautions: Back Precaution Comments: Patient able to recall 2/3 back precautions. Reviewed precautions with patient Restrictions Weight Bearing Restrictions: No    Mobility  Bed Mobility               General bed mobility comments: sitting EOB with NT present on arrival    Transfers Overall transfer level: Needs assistance Equipment used: Rolling [place] (2 wheeled) Transfers: Sit to/from Stand Sit to Stand: Min guard         General transfer comment: no physical assistance required  Ambulation/Gait Ambulation/Gait assistance: Min guard;Supervision Gait Distance (Feet): 200 Feet Assistive device: Rolling Sprigg (2 wheeled) Gait  Pattern/deviations: Step-through pattern;Decreased stride length Gait velocity: Decreased   General Gait Details: cues for RW proximity   Stairs             Wheelchair Mobility    Modified Rankin (Stroke Patients Only)       Balance Overall balance assessment: Needs assistance Sitting-balance support: No upper extremity supported Sitting balance-Leahy Scale: Good     Standing balance support: During functional activity;Bilateral upper extremity supported Standing balance-Leahy Scale: Poor Standing balance comment: reliant on UE support for mobility                            Cognition Arousal/Alertness: Awake/alert Behavior During Therapy: WFL for tasks assessed/performed Overall Cognitive Status: Within Functional Limits for tasks assessed                                        Exercises      General Comments        Pertinent Vitals/Pain Pain Assessment: Faces Faces Pain Scale: Hurts little more Pain Location: back Pain Descriptors / Indicators: Discomfort Pain Intervention(s): Monitored during session    Home Living                      Prior Function            PT Goals (current goals can now be found in the care plan section) Acute Rehab PT Goals Patient Stated Goal: Return home PT Goal Formulation: With patient Time For  Goal Achievement: 02/24/21 Potential to Achieve Goals: Good Progress towards PT goals: Progressing toward goals    Frequency    Min 3X/week      PT Plan Current plan remains appropriate    Co-evaluation              AM-PAC PT "6 Clicks" Mobility   Outcome Measure  Help needed turning from your back to your side while in a flat bed without using bedrails?: None Help needed moving from lying on your back to sitting on the side of a flat bed without using bedrails?: A Little Help needed moving to and from a bed to a chair (including a wheelchair)?: A Little Help needed  standing up from a chair using your arms (e.g., wheelchair or bedside chair)?: A Little Help needed to walk in hospital room?: A Little Help needed climbing 3-5 steps with a railing? : A Little 6 Click Score: 19    End of Session   Activity Tolerance: Patient tolerated treatment well Patient left: in bed;with call bell/phone within reach Nurse Communication: Mobility status;Patient requests pain meds PT Visit Diagnosis: Unsteadiness on feet (R26.81);Pain     Time: 4818-5631 PT Time Calculation (min) (ACUTE ONLY): 20 min  Charges:  $Therapeutic Activity: 8-22 mins                     Charles Santos A. Gilford Rile PT, DPT Acute Rehabilitation Services Pager [phone] Office [phone]    Charles Santos 02/12/2021, 10:49 AM

## 2021-02-12 NOTE — Plan of Care (Signed)

## 2021-02-12 NOTE — TOC Transition Note (Signed)
Transition of Care [facility]) - CM/SW Discharge Note   Patient Details  Name: Charles Santos MRN: [id number] Date of Birth: 22-Mar-1971  Transition of [facility] CM/SW Contact:  Pollie Friar, RN Phone Number: 02/12/2021, 10:19 AM   Clinical Narrative:    Pt discharging home with self care. No f/u per PT. [place] for home at the bedside.  Pt has transport home after 3 pm.    Final next level of care: Home/Self Care Barriers to Discharge: No Barriers Identified   Patient Goals and CMS Choice     Choice offered to / list presented to : Patient  Discharge Placement                       Discharge Plan and Services   Discharge Planning Services: CM Consult Post Acute Care Choice: Durable Medical Equipment          DME Arranged: [place] rolling DME Agency: [employer] Date DME Agency Contacted: 02/11/21   Representative spoke with at DME Agency: [place] ([place]) Interventions     Readmission Risk Interventions No flowsheet data found.

## 2021-02-12 NOTE — Discharge Summary (Signed)
Physician Discharge Summary  Charles Santos FHL:[id number] DOB: Jan 23, 1971 DOA: 02/06/2021  PCP: Claretta Fraise, MD  Admit date: 02/06/2021 Discharge date: 02/12/2021  Admitted From: home Disposition:  home  Recommendations for Outpatient Follow-up:  1. Follow up with PCP in 1-2 weeks 2. Follow-up with neurosurgery in 1 to 2 weeks  Home Health: none Equipment/Devices: Blick  Discharge Condition: stable CODE STATUS: Full code Diet recommendation: regular  HPI: Per admitting MD, Charles Santos is a 49 y.o. male with history of colon cancer status post subtotal colectomy, hypertension, alcohol abuse, emphysema who had recent low back surgery by neurosurgery and discharged home on February 22 was brought to the ER after patient was having increasing confusion and hallucination as per the report.  Patient also was complaining of epigastric discomfort.  Had some nausea vomiting.  Denies any chest pain or shortness of breath.  Hospital Course / Discharge diagnoses: Principal Problem Acute metabolic encephalopathy due to alcohol withdrawal -Patient with significant alcohol withdrawal, requiring Precedex at 1 point and needed to stay in the ICU.  He was much improved, off Precedex, close to his baseline and seems to be alert and oriented x4 this morning.  Has good insight and is determined to quit drinking.  No longer triggering CIWA, stable to go home  Active Problems Hyponatremia -slowly improving, patient eating well, improving off IV fluids Transaminitis, alcoholic hepatitis -maddrey discriminant score doesn't indicate need for steroids. Negative acute hepatitis panel. CT abd/pelvis with normal hepatobiliary findings> LFTs improving Ileus -CT from 2/25 with dilated fluid and air filled small intestine all the way to the small bowel colon anastomosis - ddx post op ileus vs SBP at small bowel colon anastomosis.  Repeat KUB 2/20 weight with improvement.  Tolerating regular diet Chronic  diarrhea-patient tells me that this is ongoing since his colectomy.  Feels like he is at baseline.  Urinary retention-resolved, Foley discontinued, voiding on his own once more ambulatory Recent lumbar spine surgery- Per NSGY, outpatient follow up  Sepsis ruled out   Discharge Instructions   Allergies as of 02/12/2021      Reactions   Adhesive [tape] Rash, Other (See Comments)   Prefers paper tape      Medication List    STOP taking these medications   gabapentin 300 MG capsule Commonly known as: NEURONTIN   oxyCODONE 5 MG immediate release tablet Commonly known as: Oxy IR/ROXICODONE     TAKE these medications   atorvastatin 40 MG tablet Commonly known as: LIPITOR Take 1 tablet (40 mg total) by mouth daily.   chlorthalidone 25 MG tablet Commonly known as: HYGROTON Take 1 tablet (25 mg total) by mouth daily.   cyclobenzaprine 10 MG tablet Commonly known as: FLEXERIL Take 1 tablet (10 mg total) by mouth 3 (three) times daily as needed for muscle spasms. What changed: when to take this   Fish Oil 1200 MG Caps Take 1,200 mg by mouth daily.   gemfibrozil 600 MG tablet Commonly known as: LOPID TAKE  (1)  TABLET TWICE A DAY WITH MEALS (BREAKFAST AND SUPPER) What changed:   how much to take  how to take this  when to take this   loratadine 10 MG tablet Commonly known as: CLARITIN Take 1 tablet (10 mg total) by mouth daily.   multivitamin with minerals Tabs tablet Take 1 tablet by mouth daily.   pantoprazole 40 MG tablet Commonly known as: PROTONIX Take 1 tablet (40 mg total) by mouth daily.   potassium chloride SA 20  MEQ tablet Commonly known as: KLOR-CON TAKE  (1)  TABLET  THREE TIMES DAILY. What changed:   how much to take  how to take this  when to take this  additional instructions   thiamine 100 MG tablet Take 1 tablet (100 mg total) by mouth daily.            Durable Medical Equipment  (From admission, onward)         Start      Ordered   02/11/21 1502  For home use only DME [employer] rolling  Once       Question Answer Comment  [place]: With 5 Inch Wheels   Patient needs a Cantera to treat with the following condition Weakness      02/11/21 1501          Follow-up Information    Judith Part, MD Follow up.   Specialty: Neurosurgery Contact information: [address] [place] [address] [phone]               Consultations:  Neurosurgery   PCCM  Procedures/Studies:  DG Lumbar Spine 2-3 Views  Result Date: 02/02/2021 CLINICAL DATA:  Surgery, elective. Additional history provided: L4-L5 fusion. Provided fluoroscopy time 1 minute, 39 seconds. EXAM: LUMBAR SPINE - 2-3 VIEW; DG C-ARM 1-60 MIN COMPARISON:  Lumbar spine MRI 11/25/2020. FINDINGS: PA and lateral view intraoperative fluoroscopic images of the lumbar spine are submitted, 5 images total. The lowest well-formed intervertebral disc space is designated L5-S1. On the final intraoperative images provided, a posterior spinal fusion construct is present at the L4-L5 level (bilateral pedicle screws and vertical interconnecting rods). An L4-L5 interbody device is also present. No unexpected finding. IMPRESSION: Five intraoperative fluoroscopic images from L4-L5 fusion, as described. Electronically Signed   By: Kellie Simmering DO   On: 02/02/2021 19:59   DG Abd 1 View  Result Date: 02/11/2021 CLINICAL DATA:  Follow-up ileus.  Prior lumbar surgery. EXAM: ABDOMEN - 1 VIEW COMPARISON:  02/10/2021. FINDINGS: Persistent distention of small and large bowel consistent with persistent adynamic ileus. Continued follow-up exams suggested to demonstrate resolution and to exclude bowel obstruction. No free air noted. Prior lumbar fusion. Tiny density noted over the left abdomen may represent undigested pill fragment. No acute bony abnormality. IMPRESSION: Persistent distention of small and large bowel consistent with persistent adynamic ileus. Continued  follow-up exams suggested to demonstrate resolution and to exclude bowel obstruction. Electronically Signed   By: Marcello Moores  Register   On: 02/11/2021 11:30   DG Abd 1 View  Result Date: 02/10/2021 CLINICAL DATA:  Follow-up ileus EXAM: ABDOMEN - 1 VIEW COMPARISON:  02/09/2021 FINDINGS: Scattered large and small bowel gas is noted relatively stable from the prior exam consistent with a generalized small bowel ileus. No free air is seen. No abnormal mass is noted. Postsurgical changes are seen. IMPRESSION: Stable small-bowel ileus. Electronically Signed   By: Inez Catalina M.D.   On: 02/10/2021 08:39   DG Abd 1 View  Result Date: 02/09/2021 CLINICAL DATA:  Patient status post L4-5 fusion 02/02/2021. Patient admitted to the hospital with delirium 02/03/2021. Abdominal discomfort. EXAM: ABDOMEN - 1 VIEW COMPARISON:  Single-view of the abdomen 02/08/2021 and 02/07/2021. FINDINGS: There has been continued improvement and mild gaseous distention of small bowel. There is some gas in the ascending colon. IMPRESSION: Mild improvement in ileus.  No new abnormality. Electronically Signed   By: Inge Rise M.D.   On: 02/09/2021 15:02   DG Abd 1 View  Result Date: 02/07/2021 CLINICAL DATA:  Abdominal distension, evaluate ileus. EXAM: ABDOMEN - 1 VIEW COMPARISON:  CT abdomen pelvis February 06, 2021. FINDINGS: Similar diffuse small bowel dilation measuring up to 4.2 cm. Lower lumbar posterior fixation hardware and disc spacer. IMPRESSION: Similar diffuse small bowel dilation measuring up to 4.2 cm compatible with ileus versus obstruction. Electronically Signed   By: Dahlia Bailiff MD   On: 02/07/2021 10:44   CT HEAD WO CONTRAST  Result Date: 02/07/2021 CLINICAL DATA:  Mental status changes of unknown cause. EXAM: CT HEAD WITHOUT CONTRAST TECHNIQUE: Contiguous axial images were obtained from the base of the skull through the vertex without intravenous contrast. COMPARISON:  None. FINDINGS: Brain: The brain shows a  normal appearance without evidence of malformation, atrophy, old or acute small or large vessel infarction, mass lesion, hemorrhage, hydrocephalus or extra-axial collection. Vascular: No hyperdense vessel. No evidence of atherosclerotic calcification. Skull: Normal.  No traumatic finding.  No focal bone lesion. Sinuses/Orbits: Sinuses are clear. Orbits appear normal. Mastoids are clear. Other: None significant IMPRESSION: Normal head CT. Electronically Signed   By: Nelson Chimes M.D.   On: 02/07/2021 03:33   CT ABDOMEN PELVIS W CONTRAST  Result Date: 02/06/2021 CLINICAL DATA:  Acute abdominal pain.  Recent lumbar surgery. EXAM: CT ABDOMEN AND PELVIS WITH CONTRAST TECHNIQUE: Multidetector CT imaging of the abdomen and pelvis was performed using the standard protocol following bolus administration of intravenous contrast. CONTRAST:  118mL OMNIPAQUE IOHEXOL 300 MG/ML  SOLN COMPARISON:  06/29/2020 FINDINGS: Lower chest: Minimal linear atelectasis at the lung bases. Hepatobiliary: Normal Pancreas: Normal Spleen: Normal Adrenals/Urinary Tract: Adrenal glands are normal. Kidneys are normal. Bladder is normal. Stomach/Bowel: Stomach is normal. Dilated fluid and air-filled small intestine. Previous colectomy with residual rectosigmoid. I do not see any nondilated loops of small intestine. The differential diagnosis is postoperative ileus versus small-bowel obstruction at the small bowel-colon anastomosis. Fairly large amount of stool in the rectosigmoid. Vascular/Lymphatic: Aortic atherosclerosis. No aneurysm. IVC is normal. No adenopathy. Reproductive: Normal Other: Tiny amount fluid in the peritoneal space. No free air. Bilateral inguinal hernias containing only fat. Musculoskeletal: Previous PLIF L4-5.  See results of lumbar MRI. IMPRESSION: 1. Previous colectomy with residual rectosigmoid. Dilated fluid and air-filled small intestine all the way to the small bowel-colon anastomosis. The differential diagnosis is  postoperative ileus versus small-bowel obstruction at the small bowel-colon anastomosis. No sign of free air. Tiny amount of free fluid in the peritoneal space. 2. Bilateral inguinal hernias containing only fat. 3. Aortic atherosclerosis. 4. Previous PLIF L4-5. See results of lumbar MRI. Aortic Atherosclerosis (ICD10-I70.0). Electronically Signed   By: Nelson Chimes M.D.   On: 02/06/2021 23:21   CT L-SPINE NO CHARGE  Result Date: 02/06/2021 CLINICAL DATA:  Recent lumbar surgery.  Back and abdominal pain. EXAM: CT LUMBAR SPINE WITHOUT CONTRAST TECHNIQUE: Multidetector CT imaging of the lumbar spine was performed without intravenous contrast administration. Multiplanar CT image reconstructions were also generated. COMPARISON:  Operative images [id number].  Preoperative MRI 11/25/2020. FINDINGS: Segmentation: 5 lumbar type vertebral bodies. Alignment: Normal Vertebrae: Previous PLIF L4-5. See below. No other significant focal bone finding. Paraspinal and other soft tissues: Expected air/gas in posterior soft tissues of the surgical approach. Disc levels: No significant finding at L2-3 or above. L3-4: Broad-based disc herniation. Spinal stenosis at this level that could cause neural compression. Difficult to determine if this represents a change from the preoperative MRI. L4-5: Previous PLIF. Considerable artifact related to hardware. No evidence of hardware malposition. No  unexpected finding. L5-S1: Degeneration of the disc with mild vacuum phenomenon and mild bulging. Mild facet osteoarthritis per no compressive stenosis. IMPRESSION: 1. Previous PLIF L4-5. No unexpected finding. 2. Disc protrusion at L3-4 with spinal stenosis. This could cause neural compression. Is difficult to tell this represents a change in relation to the preoperative MRI dated 11/25/2020. 3. Expected air/gas in the posterior soft tissues of the surgical approach. Electronically Signed   By: Nelson Chimes M.D.   On: 02/06/2021 23:15   DG CHEST  PORT 1 VIEW  Result Date: 02/07/2021 CLINICAL DATA:  Hallucinations, back surgery EXAM: PORTABLE CHEST 1 VIEW COMPARISON:  12/22/2020 FINDINGS: Lungs volumes are small, but are symmetric and are clear on this rotated examination. No pneumothorax or pleural effusion. Cardiac size within normal limits. Pulmonary vascularity is normal. Osseous structures are age-appropriate. No acute bone abnormality. IMPRESSION: No active disease. Electronically Signed   By: Fidela Salisbury MD   On: 02/07/2021 05:54   DG Abd Portable 1V  Result Date: 02/08/2021 CLINICAL DATA:  Ileus EXAM: PORTABLE ABDOMEN - 1 VIEW COMPARISON:  None. FINDINGS: Left flank and inferior pelvis are excluded from view. Gaseous distension of the small bowel has improved, though has not yet completely resolved in keeping with an improving small-bowel obstruction or ileus. Small amount of gas is now identified within the probable right colon. No gross free intraperitoneal gas. L4-5 lumbar fusion with instrumentation has been performed. IMPRESSION: Improving gaseous distension of the small bowel in keeping with a resolving small bowel obstruction or ileus. Electronically Signed   By: Fidela Salisbury MD   On: 02/08/2021 07:05   DG C-Arm 1-60 Min  Result Date: 02/02/2021 CLINICAL DATA:  Surgery, elective. Additional history provided: L4-L5 fusion. Provided fluoroscopy time 1 minute, 39 seconds. EXAM: LUMBAR SPINE - 2-3 VIEW; DG C-ARM 1-60 MIN COMPARISON:  Lumbar spine MRI 11/25/2020. FINDINGS: PA and lateral view intraoperative fluoroscopic images of the lumbar spine are submitted, 5 images total. The lowest well-formed intervertebral disc space is designated L5-S1. On the final intraoperative images provided, a posterior spinal fusion construct is present at the L4-L5 level (bilateral pedicle screws and vertical interconnecting rods). An L4-L5 interbody device is also present. No unexpected finding. IMPRESSION: Five intraoperative fluoroscopic images  from L4-L5 fusion, as described. Electronically Signed   By: Kellie Simmering DO   On: 02/02/2021 19:59      Subjective: - no chest pain, shortness of breath, no abdominal pain, nausea or vomiting.   Discharge Exam: BP 122/77 (BP Location: Left Arm)   Pulse 83   Temp 99.8 F (37.7 C) (Oral)   Resp 17   Ht 5\' 10"  (1.778 m)   Wt 97.2 kg   SpO2 98%   BMI 30.75 kg/m   General: Pt is alert, awake, not in acute distress Cardiovascular: RRR, S1/S2 +, no rubs, no gallops Respiratory: CTA bilaterally, no wheezing, no rhonchi Abdominal: Soft, NT, ND, bowel sounds + Extremities: no edema, no cyanosis   The results of significant diagnostics from this hospitalization (including imaging, microbiology, ancillary and laboratory) are listed below for reference.     Microbiology: Recent Results (from the past 240 hour(s))  Resp Panel by RT-PCR (Flu A&B, Covid) Nasopharyngeal Swab     Status: None   Collection Time: 02/06/21  1:30 AM   Specimen: Nasopharyngeal Swab; Nasopharyngeal(NP) swabs in vial transport medium  Result Value Ref Range Status   SARS Coronavirus 2 by RT PCR NEGATIVE NEGATIVE Final    Comment: (NOTE)  SARS-CoV-2 target nucleic acids are NOT DETECTED.  The SARS-CoV-2 RNA is generally detectable in upper respiratory specimens during the acute phase of infection. The lowest concentration of SARS-CoV-2 viral copies this assay can detect is 138 copies/mL. A negative result does not preclude SARS-Cov-2 infection and should not be used as the sole basis for treatment or other patient management decisions. A negative result may occur with  improper specimen collection/handling, submission of specimen other than nasopharyngeal swab, presence of viral mutation(s) within the areas targeted by this assay, and inadequate number of viral copies(<138 copies/mL). A negative result must be combined with clinical observations, patient history, and epidemiological information. The expected  result is Negative.  Fact Sheet for Patients:  EntrepreneurPulse.com.au  Fact Sheet for Healthcare Providers:  IncredibleEmployment.be  This test is no t yet approved or cleared by the [place] FDA and  has been authorized for detection and/or diagnosis of SARS-CoV-2 by FDA under an Emergency Use Authorization (EUA). This EUA will remain  in effect (meaning this test can be used) for the duration of the COVID-19 declaration under Section 564(b)(1) of the Act, 21 U.S.C.section 360bbb-3(b)(1), unless the authorization is terminated  or revoked sooner.       Influenza A by PCR NEGATIVE NEGATIVE Final   Influenza B by PCR NEGATIVE NEGATIVE Final    Comment: (NOTE) The Xpert Xpress SARS-CoV-2/FLU/RSV plus assay is intended as an aid in the diagnosis of influenza from Nasopharyngeal swab specimens and should not be used as a sole basis for treatment. Nasal washings and aspirates are unacceptable for Xpert Xpress SARS-CoV-2/FLU/RSV testing.  Fact Sheet for Patients: EntrepreneurPulse.com.au  Fact Sheet for Healthcare Providers: IncredibleEmployment.be  This test is not yet approved or cleared by the [place] FDA and has been authorized for detection and/or diagnosis of SARS-CoV-2 by FDA under an Emergency Use Authorization (EUA). This EUA will remain in effect (meaning this test can be used) for the duration of the COVID-19 declaration under Section 564(b)(1) of the Act, 21 U.S.C. section 360bbb-3(b)(1), unless the authorization is terminated or revoked.  Performed at [facility], [place] [address] [place], [place] [address]   Blood culture (routine x 2)     Status: None   Collection Time: 02/06/21 10:29 PM   Specimen: BLOOD  Result Value Ref Range Status   Specimen Description BLOOD SITE NOT SPECIFIED  Final   Special Requests   Final    BOTTLES DRAWN AEROBIC AND ANAEROBIC Blood Culture  adequate volume   Culture   Final    NO GROWTH 5 DAYS Performed at [facility], [address], [place], [place] [address]    Report Status 02/11/2021 FINAL  Final  Blood culture (routine x 2)     Status: None   Collection Time: 02/06/21 10:44 PM   Specimen: BLOOD  Result Value Ref Range Status   Specimen Description BLOOD SITE NOT SPECIFIED  Final   Special Requests   Final    BOTTLES DRAWN AEROBIC AND ANAEROBIC Blood Culture results may not be optimal due to an inadequate volume of blood received in culture bottles   Culture   Final    NO GROWTH 5 DAYS Performed at [facility], [place] [address] [place], [place] [address]    Report Status 02/11/2021 FINAL  Final     Labs: Basic Metabolic Panel: Recent Labs  Lab 02/06/21 1831 02/07/21 0503 02/07/21 1025 02/08/21 0150 02/08/21 1303 02/08/21 2159 02/09/21 0540 02/10/21 0354 02/10/21 8527  02/11/21 0317 02/12/21 0331  NA 121* 125*   < > 126*   < > 130* 131* 130*  --  131* 133*  K 3.0* 2.9*   < > 3.0*   < > 3.5 3.8 3.3*  --  3.6 3.7  CL 75* 84*   < > 89*   < > 93* 96* 95*  --  100 98  CO2 34* 29   < > 28   < > 28 25 25   --  21* 24  GLUCOSE 113* 118*   < > 88   < > 100* 109* 93  --  88 110*  BUN 10 12   < > 13   < > 12 14 10   --  8 13  CREATININE 0.90 0.95   < > 0.84   < > 0.86 0.92 0.96  --  0.85 1.11  CALCIUM 8.9 8.1*   < > 8.2*   < > 8.2* 8.6* 8.6*  --  8.7* 9.2  MG 2.4 2.2  --  2.2  --   --   --   --  2.0 2.0  --   PHOS  --   --   --   --   --   --   --   --   --  4.2  --    < > = values in this interval not displayed.   Liver Function Tests: Recent Labs  Lab 02/07/21 0503 02/08/21 0150 02/09/21 0540 02/10/21 0354 02/11/21 0317  AST 260* 177* 123* 80* 60*  ALT 122* 100* 89* 73* 61*  ALKPHOS 77 75 81 75 73  BILITOT 2.1* 2.0* 1.3* 1.2 1.2  PROT 5.6* 5.5* 5.9* 6.1* 6.2*  ALBUMIN 2.4* 2.2* 2.3* 2.3* 2.4*   CBC: Recent Labs  Lab 02/08/21 0150 02/09/21 0540 02/10/21 0354 02/11/21 0317  02/12/21 0331  WBC 2.1* 3.2* 3.6* 5.3 6.7  NEUTROABS  --   --   --  2.7  --   HGB 11.3* 12.4* 11.5* 11.7* 12.2*  HCT 31.3* 35.8* 34.1* 34.9* 36.6*  MCV 96.6 97.8 99.1 99.4 99.5  PLT 177 236 264 298 361   CBG: Recent Labs  Lab 02/11/21 1128 02/11/21 1555 02/11/21 2007 02/11/21 2338 02/12/21 0341  GLUCAP 114* 100* 122* 108* 112*   Hgb A1c No results for input(s): HGBA1C in the last 72 hours. Lipid Profile No results for input(s): CHOL, HDL, LDLCALC, TRIG, CHOLHDL, LDLDIRECT in the last 72 hours. Thyroid function studies No results for input(s): TSH, T4TOTAL, T3FREE, THYROIDAB in the last 72 hours.  Invalid input(s): FREET3 Urinalysis    Component Value Date/Time   COLORURINE YELLOW 02/06/2021 1831   APPEARANCEUR CLEAR 02/06/2021 1831   LABSPEC 1.011 02/06/2021 1831   PHURINE 6.0 02/06/2021 1831   GLUCOSEU NEGATIVE 02/06/2021 1831   HGBUR SMALL (A) 02/06/2021 1831   BILIRUBINUR NEGATIVE 02/06/2021 1831   KETONESUR NEGATIVE 02/06/2021 1831   PROTEINUR NEGATIVE 02/06/2021 1831   NITRITE NEGATIVE 02/06/2021 1831   LEUKOCYTESUR NEGATIVE 02/06/2021 1831    FURTHER DISCHARGE INSTRUCTIONS:   Get Medicines reviewed and adjusted: Please take all your medications with you for your next visit with your Primary MD   Laboratory/radiological data: Please request your Primary MD to go over all hospital tests and procedure/radiological results at the follow up, please ask your Primary MD to get all Hospital records sent to his/her office.   In some cases, they will be blood work, cultures and biopsy results pending at the  time of your discharge. Please request that your primary care M.D. goes through all the records of your hospital data and follows up on these results.   Also Note the following: If you experience worsening of your admission symptoms, develop shortness of breath, life threatening emergency, suicidal or homicidal thoughts you must seek medical attention immediately  by calling 911 or calling your MD immediately  if symptoms less severe.   You must read complete instructions/literature along with all the possible adverse reactions/side effects for all the Medicines you take and that have been prescribed to you. Take any new Medicines after you have completely understood and accpet all the possible adverse reactions/side effects.    Do not drive when taking Pain medications or sleeping medications (Benzodaizepines)   Do not take more than prescribed Pain, Sleep and Anxiety Medications. It is not advisable to combine anxiety,sleep and pain medications without talking with your primary care practitioner   Special Instructions: If you have smoked or chewed Tobacco  in the last 2 yrs please stop smoking, stop any regular Alcohol  and or any Recreational drug use.   Wear Seat belts while driving.   Please note: You were cared for by a hospitalist during your hospital stay. Once you are discharged, your primary care physician will handle any further medical issues. Please note that NO REFILLS for any discharge medications will be authorized once you are discharged, as it is imperative that you return to your primary care physician (or establish a relationship with a primary care physician if you do not have one) for your post hospital discharge needs so that they can reassess your need for medications and monitor your lab values.  Time coordinating discharge: 35 minutes  SIGNED:  Marzetta Board, MD, PhD 02/12/2021, 8:23 AM

## 2021-02-13 NOTE — Care Management Important Message (Signed)
Important Message  Patient Details  Name: Charles Santos MRN: [id number] Date of Birth: [birth year]   Medicare Important Message Given:  Yes  Mailed IM to the patient address.   [name] 02/13/2021, 11:57 AM

## 2021-02-14 LAB — VITAMIN B1: Vitamin B1 (Thiamine): 117.7 nmol/L (ref 66.5–200.0)

## 2021-02-16 ENCOUNTER — Encounter: Payer: Self-pay | Admitting: Family Medicine

## 2021-02-16 ENCOUNTER — Ambulatory Visit (INDEPENDENT_AMBULATORY_CARE_PROVIDER_SITE_OTHER): Payer: 59 | Admitting: Family Medicine

## 2021-02-16 ENCOUNTER — Other Ambulatory Visit: Payer: Self-pay

## 2021-02-16 VITALS — BP 105/68 | HR 89 | Temp 97.3°F | Ht 70.0 in | Wt 196.4 lb

## 2021-02-16 DIAGNOSIS — M4306 Spondylolysis, lumbar region: Secondary | ICD-10-CM | POA: Diagnosis not present

## 2021-02-16 DIAGNOSIS — F1020 Alcohol dependence, uncomplicated: Secondary | ICD-10-CM | POA: Diagnosis not present

## 2021-02-16 DIAGNOSIS — M5416 Radiculopathy, lumbar region: Secondary | ICD-10-CM | POA: Diagnosis not present

## 2021-02-16 DIAGNOSIS — I10 Essential (primary) hypertension: Secondary | ICD-10-CM

## 2021-02-16 NOTE — Progress Notes (Signed)
Subjective:  Patient ID: Charles Santos, male    DOB: 1971-01-02  Age: 50 y.o. MRN: [id number]  CC: Follow-up (Back surgery)   HPI Charles Santos presents for post op from recent lumbar fusion procedure.  This was performed on February 21.  Unfortunately a few days later he was taking the oxycodone that he was prescribed and this led to a relapse of his alcoholism.  He went to the emergency department and says that he was out of it until he woke up in the hospital on March 2.  He was discharged on March 3.  He says that he is not going to use any more alcohol.  He will not take any further opiates.  And his only pain currently is a tightness sensation at the surgical site.  There is no radiculopathy reported.  Depression screen [facility] 2/9 02/16/2021 01/29/2021 11/24/2020  Decreased Interest 0 0 0  Down, Depressed, Hopeless 0 0 0  PHQ - 2 Score 0 0 0  Altered sleeping - - -  Tired, decreased energy - - -  Change in appetite - - -  Feeling bad or failure about yourself  - - -  Trouble concentrating - - -  Moving slowly or fidgety/restless - - -  Suicidal thoughts - - -  PHQ-9 Score - - -  Difficult doing work/chores - - -  Some recent data might be hidden    History Charles Santos has a past medical history of Acute midline low back pain with bilateral sciatica (05/16/2020), Anxiety, Cancer of transverse colon ([place]) (09/17/2016), Colon cancer ([place]) (6948), Complication of anesthesia, Depression, Emphysema of lung ([place]), Family hx of colon cancer, GERD (gastroesophageal reflux disease), Headache, Hyperlipidemia, Hypertension, and Vitamin D deficiency.   He has a past surgical history that includes colon resectomy (1998); Appendectomy; Laparoscopic subtotal colectomy (N/A, 09/17/2016); Proctoscopy (09/17/2016); Inguinal hernia repair; Upper gi endoscopy; polyp removal at [place] (2017); Flexible sigmoidoscopy (N/A, 05/19/2017); Esophagogastroduodenoscopy (egd) with propofol (N/A, 05/19/2017); Colonoscopy; and  Transforaminal lumbar interbody fusion w/ mis 1 level (Right, 02/02/2021).   His family history includes Colon cancer in his father.He reports that he quit smoking about 4 months ago. His smoking use included cigarettes. He has a 23.00 pack-year smoking history. He has never used smokeless tobacco. He reports current alcohol use of about 24.0 standard drinks of alcohol per week. He reports that he does not use drugs.    ROS Review of Systems  Constitutional: Negative for fever.  Respiratory: Negative for shortness of breath.   Cardiovascular: Negative for chest pain.  Musculoskeletal: Negative for arthralgias.  Skin: Negative for rash.    Objective:  BP 105/68   Pulse 89   Temp (!) 97.3 F (36.3 C)   Ht 5\' 10"  (1.778 m)   Wt 196 lb 6.4 oz (89.1 kg)   SpO2 97%   BMI 28.18 kg/m   BP Readings from Last 3 Encounters:  02/16/21 105/68  02/12/21 129/72  02/03/21 (!) 163/96    Wt Readings from Last 3 Encounters:  02/16/21 196 lb 6.4 oz (89.1 kg)  02/07/21 214 lb 4.6 oz (97.2 kg)  02/02/21 215 lb (97.5 kg)     Physical Exam Vitals reviewed.  Constitutional:      Appearance: He is well-developed and well-nourished.  HENT:     Head: Normocephalic and atraumatic.     Right Ear: Tympanic membrane and external ear normal. No decreased hearing noted.     Left Ear: Tympanic membrane and external ear  normal. No decreased hearing noted.     Mouth/Throat:     Pharynx: No oropharyngeal exudate or posterior oropharyngeal erythema.  Eyes:     Pupils: Pupils are equal, round, and reactive to light.  Cardiovascular:     Rate and Rhythm: Normal rate and regular rhythm.     Heart sounds: No murmur heard.   Pulmonary:     Effort: No respiratory distress.     Breath sounds: Normal breath sounds.  Abdominal:     General: Bowel sounds are normal.     Palpations: Abdomen is soft. There is no mass.     Tenderness: There is no abdominal tenderness.  Musculoskeletal:     Cervical back:  Normal range of motion and neck supple.       Assessment & Plan:   Charles Santos was seen today for follow-up.  Diagnoses and all orders for this visit:  Lumbar radiculopathy  Alcoholism ([place])  Lumbar spondylolysis  Essential hypertension    He should resume all preop medicines at this time.  His activity is limited per his surgeon and not through this office.  He was cautioned regarding the use of alcohol and opiates particularly with blending the two   I am having Charles Santos maintain his atorvastatin, chlorthalidone, gemfibrozil, multivitamin with minerals, thiamine, loratadine, potassium chloride SA, Fish Oil, pantoprazole, and cyclobenzaprine.  Allergies as of 02/16/2021      Reactions   Oxycodone Other (See Comments)   Adhesive [tape] Rash, Other (See Comments)   Prefers paper tape      Medication List       Accurate as of February 16, 2021  6:32 PM. If you have any questions, ask your nurse or doctor.        atorvastatin 40 MG tablet Commonly known as: LIPITOR Take 1 tablet (40 mg total) by mouth daily.   chlorthalidone 25 MG tablet Commonly known as: HYGROTON Take 1 tablet (25 mg total) by mouth daily.   cyclobenzaprine 10 MG tablet Commonly known as: FLEXERIL Take 1 tablet (10 mg total) by mouth 3 (three) times daily as needed for muscle spasms. What changed: when to take this   Fish Oil 1200 MG Caps Take 1,200 mg by mouth daily.   gemfibrozil 600 MG tablet Commonly known as: LOPID TAKE  (1)  TABLET TWICE A DAY WITH MEALS (BREAKFAST AND SUPPER) What changed:   how much to take  how to take this  when to take this   loratadine 10 MG tablet Commonly known as: CLARITIN Take 1 tablet (10 mg total) by mouth daily.   multivitamin with minerals Tabs tablet Take 1 tablet by mouth daily.   pantoprazole 40 MG tablet Commonly known as: PROTONIX Take 1 tablet (40 mg total) by mouth daily.   potassium chloride SA 20 MEQ tablet Commonly known as:  KLOR-CON TAKE  (1)  TABLET  THREE TIMES DAILY. What changed:   how much to take  how to take this  when to take this  additional instructions   thiamine 100 MG tablet Take 1 tablet (100 mg total) by mouth daily.        Follow-up: No follow-ups on file.  Claretta Fraise, M.D.

## 2021-02-17 ENCOUNTER — Encounter: Payer: Self-pay | Admitting: Gastroenterology

## 2021-02-23 ENCOUNTER — Ambulatory Visit (AMBULATORY_SURGERY_CENTER): Payer: 59 | Admitting: *Deleted

## 2021-02-23 ENCOUNTER — Other Ambulatory Visit: Payer: Self-pay

## 2021-02-23 ENCOUNTER — Other Ambulatory Visit: Payer: Self-pay | Admitting: Family Medicine

## 2021-02-23 VITALS — Ht 70.0 in | Wt 198.0 lb

## 2021-02-23 DIAGNOSIS — T884XXA Failed or difficult intubation, initial encounter: Secondary | ICD-10-CM | POA: Insufficient documentation

## 2021-02-23 DIAGNOSIS — Z85038 Personal history of other malignant neoplasm of large intestine: Secondary | ICD-10-CM

## 2021-02-23 DIAGNOSIS — D132 Benign neoplasm of duodenum: Secondary | ICD-10-CM

## 2021-02-23 DIAGNOSIS — Z1509 Genetic susceptibility to other malignant neoplasm: Secondary | ICD-10-CM

## 2021-02-23 NOTE — Telephone Encounter (Signed)
Charles Santos, he also had a back surgery 02-02-2021 with difficult intubations as well-  I dont know if we need to schedule Flex/ EGD 9-12 weeks out

## 2021-02-23 NOTE — Progress Notes (Signed)
During PV, noted pt had a difficult intubation 2018 and again 02-02-2021- this was noted and note to MD 12-22-2020- Dr [name] and Jenny Reichmann state that pt  needs [facility] case- in that 12-22-2020 note   Dr [name] wants EGD/ Flex per his note- TE to [name] to schedule- pt did have a complete PV today- once [facility] case scheduled, we need to call and discuss prep instructions and mail to pt- pt is aware of this

## 2021-02-23 NOTE — Telephone Encounter (Signed)
Dr [name] please advise on timing of endo flex. Pt had recent back surgery.

## 2021-02-23 NOTE — Telephone Encounter (Signed)
Charles Santos,  Charles Santos called and was put on the [facility] schedule for a colon- I was doing his PV and realized he needs [facility] cases due to difficult intubation-   Per this note he needs an Endo/ Flex at [facility]-  Pt is aware that he needs a hospital  Case and it will be a different date   Thanks, Lelan Pons PV

## 2021-02-24 ENCOUNTER — Telehealth: Payer: Self-pay | Admitting: Gastroenterology

## 2021-02-24 NOTE — Telephone Encounter (Signed)
The pt has been advised that I have sent a message to Dr [name] in regards to timing of the procedure in setting of recent back surgery.  He is aware I will call him back next week.

## 2021-02-26 ENCOUNTER — Encounter: Payer: Self-pay | Admitting: Nurse Practitioner

## 2021-02-26 ENCOUNTER — Ambulatory Visit (INDEPENDENT_AMBULATORY_CARE_PROVIDER_SITE_OTHER): Payer: 59 | Admitting: Nurse Practitioner

## 2021-02-26 ENCOUNTER — Other Ambulatory Visit: Payer: Self-pay

## 2021-02-26 VITALS — BP 108/59 | HR 76 | Temp 97.2°F | Resp 20 | Ht 70.0 in | Wt 206.0 lb

## 2021-02-26 DIAGNOSIS — H6122 Impacted cerumen, left ear: Secondary | ICD-10-CM

## 2021-02-26 DIAGNOSIS — R040 Epistaxis: Secondary | ICD-10-CM

## 2021-02-26 NOTE — Patient Instructions (Signed)

## 2021-02-26 NOTE — Progress Notes (Signed)
   Subjective:    Patient ID: Charles Santos, male    DOB: 1971/09/09, 50 y.o.   MRN: [id number]   Chief Complaint: Epistaxis (When pt blows nose he has blood in it/)   HPI Pt presents today with c/o blood in his tissues when he blows his nose. The blood is dry usually, but sometimes looks fresh. He states this has been going on for about a month. He initially thought it was from alcohol intake, but has quick drinking for the past month and the bloody tissues continue. He is concerned it may be from his blood pressure. He denies any uncontrollable bleeding. He has not tried anything to help relieve his symptoms. He denies placing anything up his nose.    Review of Systems  Constitutional: Negative for fatigue and fever.  HENT: Positive for nosebleeds. Negative for ear discharge, ear pain, postnasal drip, rhinorrhea, sinus pressure, sinus pain, sneezing and sore throat.   Eyes: Negative for pain and redness.  Respiratory: Negative for cough and wheezing.   Cardiovascular: Negative for chest pain and palpitations.  Neurological: Negative for dizziness and light-headedness.       Objective:   Physical Exam HENT:     Head: Normocephalic.     Right Ear: Tympanic membrane normal.     Left Ear: There is impacted cerumen.     Nose: Congestion present.     Mouth/Throat:     Mouth: Mucous membranes are moist.  Eyes:     Pupils: Pupils are equal, round, and reactive to light.  Cardiovascular:     Rate and Rhythm: Normal rate and regular rhythm.     Pulses: Normal pulses.  Pulmonary:     Effort: Pulmonary effort is normal.  Neurological:     Mental Status: He is alert and oriented to person, place, and time.  Psychiatric:        Mood and Affect: Mood normal.        Behavior: Behavior normal.     BP (!) 108/59   Pulse 76   Temp (!) 97.2 F (36.2 C) (Temporal)   Resp 20   Ht 5\' 10"  (1.778 m)   Wt 206 lb (93.4 kg)   BMI 29.56 kg/m   S/P- left ear irrigation- TM clear     Assessment & Plan:  Charles Santos in today with chief complaint of Epistaxis (When pt blows nose he has blood in it/)   1. Nasal bleeding Triple antibiotic oint in nares nightly Do not pick Do not blow nose harshly  2. Impacted cerumen of left ear Debrox a couple of times a week.    The above assessment and management plan was discussed with the patient. The patient verbalized understanding of and has agreed to the management plan. Patient is aware to call the clinic if symptoms persist or worsen. Patient is aware when to return to the clinic for a follow-up visit. Patient educated on when it is appropriate to go to the emergency department.   Mary-Margaret Hassell Done, FNP

## 2021-03-02 NOTE — Telephone Encounter (Signed)
Please offer him my first OV to discuss all this, scheduled as appropriate.  Thanks  Not with extender please.

## 2021-03-02 NOTE — Telephone Encounter (Signed)
The pt has been set up for 5/11.  He will call to check on cancellations as he chooses.

## 2021-03-06 ENCOUNTER — Other Ambulatory Visit: Payer: 59 | Admitting: Gastroenterology

## 2021-03-09 ENCOUNTER — Other Ambulatory Visit: Payer: 59 | Admitting: Gastroenterology

## 2021-03-09 ENCOUNTER — Other Ambulatory Visit: Payer: Self-pay | Admitting: Family Medicine

## 2021-03-10 ENCOUNTER — Telehealth: Payer: Self-pay

## 2021-03-10 NOTE — Telephone Encounter (Signed)
Called and spoke with patient he was taken this before he even started seeing you for his back pain he takes naproxen 500mg  bid as need for his back. Can you started prescribing it for him ? If so he uses [facility]. Patient aware stacks is off today. He has enough to last him until next week.

## 2021-03-11 ENCOUNTER — Other Ambulatory Visit: Payer: Self-pay | Admitting: Family Medicine

## 2021-03-11 MED ORDER — NAPROXEN 500 MG PO TABS: 500.0000 mg | ORAL_TABLET | Freq: Two times a day (BID) | ORAL | 1 refills | Status: DC

## 2021-03-11 NOTE — Telephone Encounter (Signed)
Pt informed that Naproxen has been sent to his pharmacy.

## 2021-03-11 NOTE — Telephone Encounter (Signed)
Please let the patient know that I sent their prescription to their pharmacy. Thanks, WS 

## 2021-03-18 ENCOUNTER — Telehealth: Payer: Self-pay

## 2021-03-18 NOTE — Telephone Encounter (Signed)
Please have patient schedule appointment to discuss with Dr Livia Snellen

## 2021-03-18 NOTE — Telephone Encounter (Signed)
Pt says wants to go back on his nerve pill. He is not sure of the name of medication. Please use [facility] pharmacy

## 2021-03-19 ENCOUNTER — Other Ambulatory Visit: Payer: Self-pay | Admitting: Family Medicine

## 2021-03-19 DIAGNOSIS — E782 Mixed hyperlipidemia: Secondary | ICD-10-CM

## 2021-03-26 ENCOUNTER — Telehealth: Payer: Self-pay | Admitting: Gastroenterology

## 2021-03-26 NOTE — Telephone Encounter (Signed)
Patient called states he is having a lot of abdominal pain and bloody stool seeking advise.

## 2021-03-26 NOTE — Telephone Encounter (Signed)
The pt states he has an episode of abd pain and rectal bleeding yesterday.  He does say that it was only the 1 episode and has resolved.  He has an appt with Dr [name] on 5/11.  He has been advised to go to urgent care, PCP or ED if the pain or bleeding returns.  Otherwise he will keep the appt as planned on 5/11.

## 2021-04-06 ENCOUNTER — Other Ambulatory Visit: Payer: Self-pay | Admitting: Family Medicine

## 2021-04-06 ENCOUNTER — Ambulatory Visit: Payer: 59 | Admitting: Family Medicine

## 2021-04-08 ENCOUNTER — Encounter: Payer: Self-pay | Admitting: Family Medicine

## 2021-04-17 ENCOUNTER — Other Ambulatory Visit: Payer: Self-pay

## 2021-04-17 MED ORDER — POTASSIUM CHLORIDE CRYS ER 20 MEQ PO TBCR: EXTENDED_RELEASE_TABLET | ORAL | 3 refills | Status: DC

## 2021-04-22 ENCOUNTER — Encounter: Payer: Self-pay | Admitting: Gastroenterology

## 2021-04-22 ENCOUNTER — Ambulatory Visit (INDEPENDENT_AMBULATORY_CARE_PROVIDER_SITE_OTHER): Payer: 59 | Admitting: Gastroenterology

## 2021-04-22 DIAGNOSIS — Z1509 Genetic susceptibility to other malignant neoplasm: Secondary | ICD-10-CM

## 2021-04-22 DIAGNOSIS — D132 Benign neoplasm of duodenum: Secondary | ICD-10-CM | POA: Diagnosis not present

## 2021-04-22 DIAGNOSIS — Z85038 Personal history of other malignant neoplasm of large intestine: Secondary | ICD-10-CM

## 2021-04-22 NOTE — Patient Instructions (Signed)
If you are age 49 or younger, your body mass index should be between 19-25. Your Body mass index is 35.29 kg/m. If this is out of the aformentioned range listed, please consider follow up with your Primary Care Provider.   You have been scheduled for a flexible sigmoidoscopy and EGD. Please follow the written instructions given to you at your visit today. If you use inhalers (even only as needed), please bring them with you on the day of your procedure.  Due to recent changes in healthcare laws, you may see the results of your imaging and laboratory studies on MyChart before your provider has had a chance to review them.  We understand that in some cases there may be results that are confusing or concerning to you. Not all laboratory results come back in the same time frame and the provider may be waiting for multiple results in order to interpret others.  Please give [place] 48 hours in order for your provider to thoroughly review all the results before contacting the office for clarification of your results.   Thank you for entrusting me with your care and choosing [facility].  Dr [name]

## 2021-04-22 NOTE — Progress Notes (Signed)
Review of pertinent gastrointestinal problems: 1. [name]: Genetically proven [name]; s/p EGD [facility] 12/2016 Dr. Stephanie Acre for EMR of dudoenal adenoma (2.5cm, no HGD); s/p colon cancer at age 50 and then alsoadenocarcinoma the transverse colon 2017, stage II (T3 N0), status post subtotal colectomy 09/17/2016;  Flexible sigmoidoscopy 05/2017 [name]; normal IC anastomosis at 20cm.  Flexible sigmoidoscopy October 2019 the entire colon was normal to the ileocolonic anastomosis which was 20 cm from the anus.  Flex sig October 2020 normal colon to the ileocolonic anastomosis, about 25 cm from the anus.  EGD 05/2017: minor gastritis, [facility] EMR duodenal site looked normal; repeat at 2 years.  EGD October 2020 moderate nonspecific pangastritis.  Small 1 to 2 mm discrete sessile polyp was removed at the site of the 2018 duodenal adenoma EMR.,  Surrounding mucosa the region was nonspecifically edematous erythematous.  Biopsies were taken.  All the pathology showed no sign of neoplasm 2.  Documented difficult airway intubation, endoscopic procedures should not be done in an ambulatory surgical center    HPI: This is a very pleasant 50 year old man whom I last saw about 2 years ago the time of EGD and flexible sigmoidoscopy.  See those results summarized above  Blood work March 2022 CBC showed hemoglobin 12.2 CT scan abdomen pelvis with IV and oral contrast February 2022 done for "acute abdominal pain.  Recent lumbar surgery."  Suggested postop ileus.  His weight is up 35 pounds since last office visit here about 3 years ago.,  Same scale  He has had some relatively minor rectal bleeding twice in the past couple years.  No serious abdominal pains.   ROS: complete GI ROS as described in HPI, all other review negative.  Constitutional:  No unintentional weight loss   Past Medical History:  Diagnosis Date  . Acute midline low back pain with bilateral sciatica 05/16/2020  . Anxiety   . Cancer of  transverse colon ([place]) 09/17/2016   surgery done  . Colon cancer [facility]) 1998   surgery and chemo  . Complication of anesthesia    not fully under for polyp removal at [place] 2017  . Depression   . Emphysema of lung ([place])   . Family hx of colon cancer   . GERD (gastroesophageal reflux disease)    none recent  . Headache   . Hyperlipidemia   . Hypertension   . Vitamin D deficiency     Past Surgical History:  Procedure Laterality Date  . APPENDECTOMY    . colon resectomy  1998   2 degree herida  . COLONOSCOPY    . ESOPHAGOGASTRODUODENOSCOPY (EGD) WITH PROPOFOL N/A 05/19/2017   Procedure: ESOPHAGOGASTRODUODENOSCOPY (EGD) WITH PROPOFOL;  Surgeon: [name], MD;  Location: [facility] END[facility]COPY;  Service: Endoscopy;  Laterality: N/A;  . FLEXIBLE SIGMOIDOSCOPY N/A 05/19/2017   Procedure: FLEXIBLE SIGMOIDOSCOPY;  Surgeon: [name], MD;  Location: [facility] ENDOSCOPY;  Service: Endoscopy;  Laterality: N/A;  . INGUINAL HERNIA REPAIR    . LAPAROSCOPIC SUBTOTAL COLECTOMY N/A 09/17/2016   Procedure: DIAGNOSTIC LAPAROSCOPY EXPLORATORY LAPAROTOMY SUBTOTAL COLECTOMY PROCTOSCOPY;  Surgeon: [name], MD;  Location: [facility] ORS;  Service: General;  Laterality: N/A;  . polyp removal at [place]  2017  . POLYPECTOMY    . PROCTOSCOPY  09/17/2016   Procedure: PROCTOSCOPY;  Surgeon: [name], MD;  Location: [facility] ORS;  Service: General;;  . SIGMOIDOSCOPY    . TRANSFORAMINAL LUMBAR INTERBODY FUSION W/ MIS 1 LEVEL Right 02/02/2021   Procedure: Right  Lumbar four -lumbar five minimally invasive transforaminal lumbar interbody fusion;  Surgeon: Judith Part, MD;  Location: [facility];  Service: Neurosurgery;  Laterality: Right;  . UPPER GASTROINTESTINAL ENDOSCOPY    . UPPER GI ENDOSCOPY      Current Outpatient Medications  Medication Sig Dispense Refill  . chlorthalidone (HYGROTON) 25 MG tablet Take 1 tablet (25 mg total) by mouth daily. 90 tablet 3  . gabapentin (NEURONTIN) 300 MG capsule Take  by mouth.    Marland Kitchen gemfibrozil (LOPID) 600 MG tablet TAKE (1) TABLET TWICE A DAY WITH MEALS (BREAKFAST AND SUPPER) 180 tablet 0  . Multiple Vitamin (MULTIVITAMIN WITH MINERALS) TABS tablet Take 1 tablet by mouth daily.    . naproxen (NAPROSYN) 500 MG tablet Take 1 tablet (500 mg total) by mouth 2 (two) times daily. 180 tablet 1  . Omega-3 Fatty Acids (FISH OIL) 1200 MG CAPS Take 1,200 mg by mouth daily.    . pantoprazole (PROTONIX) 40 MG tablet TAKE 1 TABLET DAILY 30 tablet 1  . potassium chloride SA (KLOR-CON) 20 MEQ tablet TAKE  (1)  TABLET  THREE TIMES DAILY. 90 tablet 3  . QUEtiapine (SEROQUEL) 100 MG tablet Take 100 mg by mouth at bedtime.     No current facility-administered medications for this visit.    Allergies as of 04/22/2021 - Review Complete 04/22/2021  Allergen Reaction Noted  . Oxycodone Other (See Comments) 02/16/2021  . Adhesive [tape] Rash and Other (See Comments) 05/16/2017    Family History  Problem Relation Age of Onset  . Colon cancer Father        pt thinks he was in his 100's when dx  . Esophageal cancer Neg Hx   . Rectal cancer Neg Hx   . Stomach cancer Neg Hx   . Prostate cancer Neg Hx   . Colon polyps Neg Hx     Social History   Socioeconomic History  . Marital status: Divorced    Spouse name: Not on file  . Number of children: Not on file  . Years of education: Not on file  . Highest education level: Not on file  Occupational History  . Not on file  Tobacco Use  . Smoking status: Former Smoker    Packs/day: 1.00    Years: 23.00    Pack years: 23.00    Types: Cigarettes    Quit date: 10/07/2020    Years since quitting: 0.5  . Smokeless tobacco: Never Used  Vaping Use  . Vaping Use: Never used  Substance and Sexual Activity  . Alcohol use: Not Currently    Alcohol/week: 24.0 standard drinks    Types: 24 Cans of beer per week  . Drug use: No  . Sexual activity: Not on file  Other Topics Concern  . Not on file  Social History Narrative  .  Not on file   Social Determinants of Health   Financial Resource Strain: Not on file  Food Insecurity: Not on file  Transportation Needs: Not on file  Physical Activity: Not on file  Stress: Not on file  Social Connections: Not on file  Intimate Partner Violence: Not on file     Physical Exam: BP 120/70 (BP Location: Left Arm, Patient Position: Sitting, Cuff Size: Normal)   Pulse 76   Ht 5\' 8"  (1.727 m) Comment: height measured without shoes  Wt 232 lb 2 oz (105.3 kg)   BMI 35.29 kg/m  Constitutional: generally well-appearing Psychiatric: alert and oriented x3 Abdomen: soft, nontender,  nondistended, no obvious ascites, no peritoneal signs, normal bowel sounds No peripheral edema noted in lower extremities  Assessment and plan: 50 y.o. male with [name], personal history of colon cancer, personal history of duodenal adenomas.  He is due for repeat examination of his remaining colon by flexible sigmoidoscopy and same-day upper endoscopy to evaluate his duodenum.  These will need to be done at [facility] given his documented difficult airway intubation in the past.  I see no reason for any further blood tests or imaging studies prior to then.  Please see the "Patient Instructions" section for addition details about the plan.  Owens Loffler, MD [facility] 04/22/2021, 2:16 PM   Total time on date of encounter was 30 minutes (this included time spent preparing to see the patient reviewing records; obtaining and/or reviewing separately obtained history; performing a medically appropriate exam and/or evaluation; counseling and educating the patient and family if present; ordering medications, tests or procedures if applicable; and documenting clinical information in the health record).

## 2021-05-25 ENCOUNTER — Telehealth: Payer: Self-pay | Admitting: Gastroenterology

## 2021-05-25 NOTE — Telephone Encounter (Signed)
Inbound call from pt requesting a call back. He has questions about his procedure. Please advise

## 2021-05-25 NOTE — Telephone Encounter (Signed)
The pt wanted to make sure he could take his meds prior to his procedure.  He was advised that he can take his meds with a sip of water or take after the procedure if he chooses.  No further questions.  He will call back if he has any further questions or concerns.

## 2021-05-29 ENCOUNTER — Other Ambulatory Visit: Payer: Self-pay | Admitting: Family Medicine

## 2021-06-01 ENCOUNTER — Other Ambulatory Visit: Payer: Self-pay | Admitting: Family Medicine

## 2021-06-01 ENCOUNTER — Other Ambulatory Visit (HOSPITAL_COMMUNITY): Payer: 59

## 2021-06-01 ENCOUNTER — Ambulatory Visit: Payer: 59 | Admitting: Family Medicine

## 2021-06-02 NOTE — Progress Notes (Signed)
Attempted to obtain medical history via telephone, unable to reach at this time. I left a voicemail to return pre surgical testing department's phone call.  

## 2021-06-04 ENCOUNTER — Other Ambulatory Visit: Payer: Self-pay

## 2021-06-04 ENCOUNTER — Encounter (HOSPITAL_COMMUNITY)
Admission: RE | Disposition: A | Discharge status: Home / Self Care | Payer: Self-pay | Source: Home / Self Care | Attending: Gastroenterology

## 2021-06-04 ENCOUNTER — Ambulatory Visit (HOSPITAL_COMMUNITY)
Admission: RE | Admit: 2021-06-04 | Discharge: 2021-06-04 | Disposition: A | Discharge status: Home / Self Care | Payer: 59 | Attending: Gastroenterology | Admitting: Gastroenterology

## 2021-06-04 ENCOUNTER — Encounter (HOSPITAL_COMMUNITY): Payer: Self-pay | Admitting: Gastroenterology

## 2021-06-04 ENCOUNTER — Ambulatory Visit (HOSPITAL_COMMUNITY): Payer: 59 | Admitting: Registered Nurse

## 2021-06-04 DIAGNOSIS — Z9221 Personal history of antineoplastic chemotherapy: Secondary | ICD-10-CM | POA: Insufficient documentation

## 2021-06-04 DIAGNOSIS — Z9049 Acquired absence of other specified parts of digestive tract: Secondary | ICD-10-CM | POA: Diagnosis not present

## 2021-06-04 DIAGNOSIS — Z885 Allergy status to narcotic agent status: Secondary | ICD-10-CM | POA: Diagnosis not present

## 2021-06-04 DIAGNOSIS — Z87891 Personal history of nicotine dependence: Secondary | ICD-10-CM | POA: Diagnosis not present

## 2021-06-04 DIAGNOSIS — Z1211 Encounter for screening for malignant neoplasm of colon: Secondary | ICD-10-CM | POA: Diagnosis not present

## 2021-06-04 DIAGNOSIS — Z1509 Genetic susceptibility to other malignant neoplasm: Secondary | ICD-10-CM | POA: Diagnosis not present

## 2021-06-04 DIAGNOSIS — Z1289 Encounter for screening for malignant neoplasm of other sites: Secondary | ICD-10-CM | POA: Diagnosis not present

## 2021-06-04 DIAGNOSIS — Z800 Family history of malignant neoplasm of digestive organs: Secondary | ICD-10-CM | POA: Diagnosis not present

## 2021-06-04 DIAGNOSIS — Z85038 Personal history of other malignant neoplasm of large intestine: Secondary | ICD-10-CM

## 2021-06-04 HISTORY — PX: FLEXIBLE SIGMOIDOSCOPY: SHX5431

## 2021-06-04 HISTORY — PX: ESOPHAGOGASTRODUODENOSCOPY (EGD) WITH PROPOFOL: SHX5813

## 2021-06-04 SURGERY — SIGMOIDOSCOPY, FLEXIBLE
Anesthesia: Monitor Anesthesia Care

## 2021-06-04 MED ORDER — PROPOFOL 500 MG/50ML IV EMUL
INTRAVENOUS | Status: DC | PRN
  Administered 2021-06-04: 140 ug/kg/min via INTRAVENOUS

## 2021-06-04 MED ORDER — PROPOFOL 10 MG/ML IV BOLUS
INTRAVENOUS | Status: DC | PRN
  Administered 2021-06-04: 50 mg via INTRAVENOUS

## 2021-06-04 MED ORDER — LIDOCAINE 2% (20 MG/ML) 5 ML SYRINGE
INTRAMUSCULAR | Status: DC | PRN
  Administered 2021-06-04: 80 mg via INTRAVENOUS

## 2021-06-04 MED ORDER — LACTATED RINGERS IV SOLN: INTRAVENOUS | Status: DC

## 2021-06-04 SURGICAL SUPPLY — 15 items

## 2021-06-04 NOTE — Discharge Instructions (Signed)
YOU HAD AN ENDOSCOPIC PROCEDURE TODAY: Refer to the procedure report and other information in the discharge instructions given to you for any specific questions about what was found during the examination. If this information does not answer your questions, please call [place] office at [phone] to clarify.  ° °YOU SHOULD EXPECT: Some feelings of bloating in the abdomen. Passage of more gas than usual. Walking can help get rid of the air that was put into your GI tract during the procedure and reduce the bloating. If you had a lower endoscopy (such as a colonoscopy or flexible sigmoidoscopy) you may notice spotting of blood in your stool or on the toilet paper. Some abdominal soreness may be present for a day or two, also. ° °DIET: Your first meal following the procedure should be a light meal and then it is ok to progress to your normal diet. A half-sandwich or bowl of soup is an example of a good first meal. Heavy or fried foods are harder to digest and may make you feel nauseous or bloated. Drink plenty of fluids but you should avoid alcoholic beverages for 24 hours. If you had a esophageal dilation, please see attached instructions for diet.   ° °ACTIVITY: Your care partner should take you home directly after the procedure. You should plan to take it easy, moving slowly for the rest of the day. You can resume normal activity the day after the procedure however YOU SHOULD NOT DRIVE, use power tools, machinery or perform tasks that involve climbing or major physical exertion for 24 hours (because of the sedation medicines used during the test).  ° °SYMPTOMS TO REPORT IMMEDIATELY: °A gastroenterologist can be reached at any hour. Please call [phone]  for any of the following symptoms:  °Following lower endoscopy (colonoscopy, flexible sigmoidoscopy) °Excessive amounts of blood in the stool  °Significant tenderness, worsening of abdominal pains  °Swelling of the abdomen that is new, acute  °Fever of 100° or  higher  °Following upper endoscopy (EGD, EUS, ERCP, esophageal dilation) °Vomiting of blood or coffee ground material  °New, significant abdominal pain  °New, significant chest pain or pain under the shoulder blades  °Painful or persistently difficult swallowing  °New shortness of breath  °Black, tarry-looking or red, bloody stools ° °FOLLOW UP:  °If any biopsies were taken you will be contacted by phone or by letter within the next 1-3 weeks. Call [phone]  if you have not heard about the biopsies in 3 weeks.  °Please also call with any specific questions about appointments or follow up tests. ° °

## 2021-06-04 NOTE — Anesthesia Postprocedure Evaluation (Signed)
Anesthesia Post Note  Patient: Maclean Foister  Procedure(s) Performed: FLEXIBLE SIGMOIDOSCOPY ESOPHAGOGASTRODUODENOSCOPY (EGD) WITH PROPOFOL     Patient location during evaluation: Endoscopy Anesthesia Type: MAC Level of consciousness: awake Pain management: pain level controlled Vital Signs Assessment: post-procedure vital signs reviewed and stable Respiratory status: spontaneous breathing, nonlabored ventilation, respiratory function stable and patient connected to nasal cannula oxygen Cardiovascular status: stable and blood pressure returned to baseline Postop Assessment: no apparent nausea or vomiting Anesthetic complications: no   No notable events documented.  Last Vitals:  Vitals:   06/04/21 1225 06/04/21 1240  BP:  121/89  Pulse: 62 64  Resp: 11 12  Temp:    SpO2: 100% 100%    Last Pain:  Vitals:   06/04/21 1240  TempSrc:   PainSc: 0-No pain                 Eufemio Strahm P Dalton Molesworth

## 2021-06-04 NOTE — Transfer of Care (Signed)
Immediate Anesthesia Transfer of Care Note  Patient: Charles Santos  Procedure(s) Performed: FLEXIBLE SIGMOIDOSCOPY ESOPHAGOGASTRODUODENOSCOPY (EGD) WITH PROPOFOL  Patient Location: PACU and Endoscopy Unit  Anesthesia Type:MAC  Level of Consciousness: awake, alert , oriented and patient cooperative  Airway & Oxygen Therapy: Patient Spontanous Breathing and Patient connected to face mask oxygen  Post-op Assessment: Report given to RN, Post -op Vital signs reviewed and stable and Patient moving all extremities  Post vital signs: Reviewed and stable  Last Vitals:  Vitals Value Taken Time  BP 99/63 06/04/21 1224  Temp    Pulse 62 06/04/21 1225  Resp 11 06/04/21 1225  SpO2 100 % 06/04/21 1225  Vitals shown include unvalidated device data.  Last Pain:  Vitals:   06/04/21 0943  TempSrc: Oral  PainSc: 0-No pain         Complications: No notable events documented.

## 2021-06-04 NOTE — Op Note (Signed)
[facility] Patient Name: Charles Santos Procedure Date: 06/04/2021 MRN: [id number] Attending MD: [name] , MD Date of Birth: 03-13-1971 CSN: [id number] Age: 50 Admit Type: Outpatient Procedure:                Flexible Sigmoidoscopy Indications:              High risk colon cancer surveillance: Personal                            history of colon cancer; Lynch                            Syndrome:Genetically proven [name]; s/p                            EGD [facility] 12/2016 Dr. Stephanie Acre for EMR of dudoenal                            adenoma(2.5cm, no HGD);s/p colon cancerat age 68                            and then alsoadenocarcinoma the transverse colon                            2017, stage II (T3 N0), status post subtotal                            colectomy 09/17/2016; Flexible sigmoidoscopy 05/2017                            [name]; normal IC anastomosis at 20cm. Flexible                            sigmoidoscopy October 2019 the entire colon was                            normal to the ileocolonic anastomosis which was 20                            cm from the anus. Flex sig October 2020 normal                            colon to the ileocolonic anastomosis, about 25 cm                            from the anus. Providers:                [name], MD, Carmie End, RN, Lesia Sago, Technician, Tyna Jaksch Technician Referring MD:              Medicines:                Monitored Anesthesia Care Complications:  No immediate complications. Estimated blood loss:                            None. Estimated Blood Loss:     Estimated blood loss: none. Procedure:                Pre-Anesthesia Assessment:                           - Prior to the procedure, a History and Physical                            was performed, and patient medications and                            allergies were reviewed. The  patient's tolerance of                            previous anesthesia was also reviewed. The risks                            and benefits of the procedure and the sedation                            options and risks were discussed with the patient.                            All questions were answered, and informed consent                            was obtained. Prior Anticoagulants: The patient has                            taken no previous anticoagulant or antiplatelet                            agents. ASA Grade Assessment: III - A patient with                            severe systemic disease. After reviewing the risks                            and benefits, the patient was deemed in                            satisfactory condition to undergo the procedure.                           After obtaining informed consent, the scope was                            passed under direct vision. The CF-[id number] ([id number])  Olympus colonoscope was introduced through the anus                            and advanced to the the ileo-sigmoid anastomosis                            (at 25cm from the IC valve). The flexible                            sigmoidoscopy was accomplished without difficulty.                            The patient tolerated the procedure well. The                            quality of the bowel preparation was good. Scope In: Scope Out: Findings:      The entire examined colon appeared normal (to the normal appearing       ileocolonic anastomosis at 25cm from the anal verge) Impression:               - Normal ileocolonic anastomosis at 25cm from the                            anal verge.                           - No polyps or cancers. Moderate Sedation:      Not Applicable - Patient had care per Anesthesia. Recommendation:           - EGD now.                           - Repeat flex sigmoidoscopy in 1 year Procedure Code(s):        ---  Professional ---                           [phone], Sigmoidoscopy, flexible; diagnostic,                            including collection of specimen(s) by brushing or                            washing, when performed (separate procedure) Diagnosis Code(s):        --- Professional ---                           I50.277, Personal history of other malignant                            neoplasm of large intestine CPT copyright 2019 American Medical Association. All rights reserved. The codes documented in this report are preliminary and upon coder review may  be revised to meet current compliance requirements. [name], MD 06/04/2021 12:12:35 PM This report has been signed electronically. Number of Addenda: 0

## 2021-06-04 NOTE — Anesthesia Procedure Notes (Signed)
Procedure Name: MAC Date/Time: 06/04/2021 11:40 AM Performed by: Victoriano Lain, CRNA Pre-anesthesia Checklist: Patient identified, Emergency Drugs available, Suction available and Patient being monitored Patient Re-evaluated:Patient Re-evaluated prior to induction Oxygen Delivery Method: Simple face mask

## 2021-06-04 NOTE — Op Note (Signed)
[facility] Patient Name: Charles Santos Procedure Date: 06/04/2021 MRN: [id number] Attending MD: [name] , MD Date of Birth: 1971-11-06 CSN: [id number] Age: 50 Admit Type: Outpatient Procedure:                Upper GI endoscopy Indications:              Surveillance for malignancy secondary to Lynch                            Syndrome; [name]:Genetically proven Lynch                            Syndrome; s/p EGD [facility] 12/2016 Dr. Stephanie Acre for EMR of                            dudoenal adenoma(2.5cm, no HGD);s/p colon                            cancerat age 72 and then alsoadenocarcinoma the                            transverse colon 2017, stage II (T3 N0), status                            post subtotal colectomy 09/17/2016; EGD 05/2017:                            minor gastritis, [facility] EMR duodenal site looked                            normal; repeat at 2 years. EGD October 2020                            moderate nonspecific pangastritis. Small 1 to 2 mm                            discrete sessile polyp was removed at the site of                            the 2018 duodenal adenoma EMR., Surrounding mucosa                            the region was nonspecifically edematous                            erythematous. Biopsies were taken. All the                            pathology showed no sign of neoplasm Providers:                [name], MD, Carmie End, RN, Lesia Sago, Technician Referring MD:  Medicines:                Monitored Anesthesia Care Complications:            No immediate complications. Estimated blood loss:                            None. Estimated Blood Loss:     Estimated blood loss: none. Procedure:                Pre-Anesthesia Assessment:                           - Prior to the procedure, a History and Physical                            was performed, and patient  medications and                            allergies were reviewed. The patient's tolerance of                            previous anesthesia was also reviewed. The risks                            and benefits of the procedure and the sedation                            options and risks were discussed with the patient.                            All questions were answered, and informed consent                            was obtained. Prior Anticoagulants: The patient has                            taken no previous anticoagulant or antiplatelet                            agents. ASA Grade Assessment: III - A patient with                            severe systemic disease. After reviewing the risks                            and benefits, the patient was deemed in                            satisfactory condition to undergo the procedure.                           After obtaining informed consent, the endoscope was                            passed  under direct vision. Throughout the                            procedure, the patient's blood pressure, pulse, and                            oxygen saturations were monitored continuously. The                            [id number]-[id number] ([id number]) Olympus gastroscope was                            introduced through the mouth, and advanced to the                            second part of duodenum. The upper GI endoscopy was                            accomplished without difficulty. The patient                            tolerated the procedure well. Scope In: Scope Out: Findings:      The esophagus was normal.      The stomach was normal.      The examined duodenum was normal. Impression:               - Normal UGI tract. Moderate Sedation:      Not Applicable - Patient had care per Anesthesia. Recommendation:           - Patient has a contact number available for                            emergencies. The signs and symptoms of potential                             delayed complications were discussed with the                            patient. Return to normal activities tomorrow.                            Written discharge instructions were provided to the                            patient.                           - Resume previous diet.                           - Continue present medications.                           - Repeat upper endoscopy in 3 years for  surveillance. Procedure Code(s):        --- Professional ---                           [phone], Esophagogastroduodenoscopy, flexible,                            transoral; diagnostic, including collection of                            specimen(s) by brushing or washing, when performed                            (separate procedure) Diagnosis Code(s):        --- Professional ---                           Z15.09, Genetic susceptibility to other malignant                            neoplasm CPT copyright 2019 American Medical Association. All rights reserved. The codes documented in this report are preliminary and upon coder review may  be revised to meet current compliance requirements. [name], MD 06/04/2021 12:27:36 PM This report has been signed electronically. Number of Addenda: 0

## 2021-06-04 NOTE — H&P (Signed)
HPI: This is a man with lynch syndrome   ROS: complete GI ROS as described in HPI, all other review negative.  Constitutional:  No unintentional weight loss   Past Medical History:  Diagnosis Date   Acute midline low back pain with bilateral sciatica 05/16/2020   Anxiety    Cancer of transverse colon ([place]) 09/17/2016   surgery done   Colon cancer [facility]) 1998   surgery and chemo   Complication of anesthesia    not fully under for polyp removal at [place] 2017   Depression    Emphysema of lung ([place])    Family hx of colon cancer    GERD (gastroesophageal reflux disease)    none recent   Headache    Hyperlipidemia    Hypertension    Vitamin D deficiency     Past Surgical History:  Procedure Laterality Date   APPENDECTOMY     colon resectomy  1998   2 degree herida   COLONOSCOPY     ESOPHAGOGASTRODUODENOSCOPY (EGD) WITH PROPOFOL N/A 05/19/2017   Procedure: ESOPHAGOGASTRODUODENOSCOPY (EGD) WITH PROPOFOL;  Surgeon: [name], MD;  Location: [facility];  Service: Endoscopy;  Laterality: N/A;   FLEXIBLE SIGMOIDOSCOPY N/A 05/19/2017   Procedure: FLEXIBLE SIGMOIDOSCOPY;  Surgeon: [name], MD;  Location: [facility] END[facility];  Service: Endoscopy;  Laterality: N/A;   INGUINAL HERNIA REPAIR     LAPAROSCOPIC SUBTOTAL COLECTOMY N/A 09/17/2016   Procedure: DIAGNOSTIC LAPAROSCOPY EXPLORATORY LAPAROTOMY SUBTOTAL COLECTOMY PROCTOSCOPY;  Surgeon: [name], MD;  Location: [facility];  Service: General;  Laterality: N/A;   polyp removal at [place]  2017   POLYPECTOMY     PROCTOSCOPY  09/17/2016   Procedure: PROCTOSCOPY;  Surgeon: [name], MD;  Location: [facility] OR[facility];  Service: General;;   SIGMOIDOSCOPY     TRANSFORAMINAL LUMBAR INTERBODY FUSION W/ MIS 1 LEVEL Right 02/02/2021   Procedure: Right Lumbar four -lumbar five minimally invasive transforaminal lumbar interbody fusion;  Surgeon: Judith Part, MD;  Location: [place];  Service: Neurosurgery;  Laterality: Right;    UPPER GASTROINTESTINAL ENDOSCOPY     UPPER GI ENDOSCOPY      No current facility-administered medications for this encounter.    Allergies as of 04/22/2021 - Review Complete 04/22/2021  Allergen Reaction Noted   Oxycodone Other (See Comments) 02/16/2021   Adhesive [tape] Rash and Other (See Comments) 05/16/2017    Family History  Problem Relation Age of Onset   Colon cancer Father        pt thinks he was in his 17's when dx   Esophageal cancer Neg Hx    Rectal cancer Neg Hx    Stomach cancer Neg Hx    Prostate cancer Neg Hx    Colon polyps Neg Hx     Social History   Socioeconomic History   Marital status: Divorced    Spouse name: Not on file   Number of children: Not on file   Years of education: Not on file   Highest education level: Not on file  Occupational History   Not on file  Tobacco Use   Smoking status: Former    Packs/day: 1.00    Years: 23.00    Pack years: 23.00    Types: Cigarettes    Quit date: 10/07/2020    Years since quitting: 0.6   Smokeless tobacco: Never  Vaping Use   Vaping Use: Never used  Substance and Sexual Activity   Alcohol use: Not Currently  Alcohol/week: 24.0 standard drinks    Types: 24 Cans of beer per week   Drug use: No   Sexual activity: Not on file  Other Topics Concern   Not on file  Social History Narrative   Not on file   Social Determinants of Health   Financial Resource Strain: Not on file  Food Insecurity: Not on file  Transportation Needs: Not on file  Physical Activity: Not on file  Stress: Not on file  Social Connections: Not on file  Intimate Partner Violence: Not on file     Physical Exam: BP (!) 159/81   Pulse 71   Temp 98.7 F (37.1 C) (Oral)   Resp 11   Ht 5\' 9"  (1.753 m)   Wt 104.3 kg   SpO2 98%   BMI 33.96 kg/m  Constitutional: generally well-appearing Psychiatric: alert and oriented x3 Abdomen: soft, nontender, nondistended, no obvious ascites, no peritoneal signs, normal bowel  sounds No peripheral edema noted in lower extremities  Assessment and plan: 50 y.o. male with lynch syndrome  For sigmoidoscoyp and EGD today  Please see the "Patient Instructions" section for addition details about the plan.  Owens Loffler, MD [facility] 06/04/2021, 9:48 AM

## 2021-06-04 NOTE — Anesthesia Preprocedure Evaluation (Signed)
Anesthesia Evaluation  Patient identified by MRN, date of birth, ID band Patient awake    Reviewed: Allergy & Precautions, NPO status , Patient's Chart, lab work & pertinent test results  History of Anesthesia Complications (+) DIFFICULT AIRWAY and history of anesthetic complications  Airway Mallampati: III  TM Distance: >3 FB Neck ROM: Full    Dental no notable dental hx.    Pulmonary COPD, former smoker,    Pulmonary exam normal breath sounds clear to auscultation       Cardiovascular hypertension, Normal cardiovascular exam Rhythm:Regular Rate:Normal     Neuro/Psych  Headaches, PSYCHIATRIC DISORDERS Anxiety Depression    GI/Hepatic Neg liver ROS, GERD  Medicated and Controlled,Cancer of transverse colon    Endo/Other  negative endocrine ROS  Renal/GU negative Renal ROS     Musculoskeletal negative musculoskeletal ROS (+)   Abdominal (+) + obese,   Peds  Hematology HLD   Anesthesia Other Findings lynch syndrome hx of colon cancer hx of duodenal polyps  Reproductive/Obstetrics                             Anesthesia Physical Anesthesia Plan  ASA: 3  Anesthesia Plan: MAC   Post-op Pain Management:    Induction: Intravenous  PONV Risk Score and Plan: 1 and Propofol infusion and Treatment may vary due to age or medical condition  Airway Management Planned: Nasal Cannula  Additional Equipment:   Intra-op Plan:   Post-operative Plan:   Informed Consent: I have reviewed the patients History and Physical, chart, labs and discussed the procedure including the risks, benefits and alternatives for the proposed anesthesia with the patient or authorized representative who has indicated his/her understanding and acceptance.     Dental advisory given  Plan Discussed with: CRNA  Anesthesia Plan Comments:         Anesthesia Quick Evaluation

## 2021-06-22 ENCOUNTER — Ambulatory Visit (INDEPENDENT_AMBULATORY_CARE_PROVIDER_SITE_OTHER): Payer: 59 | Admitting: Family Medicine

## 2021-06-22 ENCOUNTER — Encounter: Payer: Self-pay | Admitting: Family Medicine

## 2021-06-22 ENCOUNTER — Other Ambulatory Visit: Payer: Self-pay

## 2021-06-22 VITALS — Temp 97.2°F | Ht 69.0 in | Wt 223.6 lb

## 2021-06-22 DIAGNOSIS — I10 Essential (primary) hypertension: Secondary | ICD-10-CM

## 2021-06-22 DIAGNOSIS — Z1509 Genetic susceptibility to other malignant neoplasm: Secondary | ICD-10-CM

## 2021-06-22 DIAGNOSIS — E782 Mixed hyperlipidemia: Secondary | ICD-10-CM | POA: Diagnosis not present

## 2021-06-22 MED ORDER — GABAPENTIN 600 MG PO TABS: ORAL_TABLET | ORAL | 3 refills | Status: DC

## 2021-06-22 MED ORDER — PANTOPRAZOLE SODIUM 40 MG PO TBEC: 40.0000 mg | DELAYED_RELEASE_TABLET | Freq: Every day | ORAL | 3 refills | Status: DC

## 2021-06-22 MED ORDER — CHLORTHALIDONE 25 MG PO TABS: 25.0000 mg | ORAL_TABLET | Freq: Every day | ORAL | 3 refills | Status: DC

## 2021-06-22 MED ORDER — NAPROXEN 500 MG PO TABS: 500.0000 mg | ORAL_TABLET | Freq: Two times a day (BID) | ORAL | 3 refills | Status: DC

## 2021-06-22 MED ORDER — GEMFIBROZIL 600 MG PO TABS: 600.0000 mg | ORAL_TABLET | Freq: Two times a day (BID) | ORAL | 3 refills | Status: DC

## 2021-06-22 NOTE — Progress Notes (Signed)
Subjective:  Patient ID: Charles Santos, male    DOB: 10/07/1971  Age: 50 y.o. MRN: [id number]  CC: Medical Management of Chronic Issues   HPI Charles Santos presents for losing weight due to exertion and cutting out some things. Stopped snack cakes, chips, sodas etc. Decreased the beer. Cut portions. Not smoking. DCed 09/2020.   Some numbness left thigh. ONly taking the gabapentin BID   presents for  follow-up of hypertension. Patient has no history of headache chest pain or shortness of breath or recent cough. Patient also denies symptoms of TIA such as focal numbness or weakness. Patient denies side effects from medication. States taking it regularly.   in for follow-up of elevated cholesterol. Doing well without complaints on current medication. Denies side effects of statin including myalgia and arthralgia and nausea. Currently no chest pain, shortness of breath or other cardiovascular related symptoms noted.  Depression screen [facility] 2/9 06/22/2021 02/26/2021 02/16/2021  Decreased Interest 0 0 0  Down, Depressed, Hopeless 0 0 0  PHQ - 2 Score 0 0 0  Altered sleeping - - -  Tired, decreased energy - - -  Change in appetite - - -  Feeling bad or failure about yourself  - - -  Trouble concentrating - - -  Moving slowly or fidgety/restless - - -  Suicidal thoughts - - -  PHQ-9 Score - - -  Difficult doing work/chores - - -  Some recent data might be hidden    History Charles Santos has a past medical history of Acute midline low back pain with bilateral sciatica (05/16/2020), Anxiety, Cancer of transverse colon ([place]) (09/17/2016), Colon cancer ([place]) (5009), Complication of anesthesia, Depression, Emphysema of lung ([place]), Family hx of colon cancer, GERD (gastroesophageal reflux disease), Headache, Hyperlipidemia, Hypertension, and Vitamin D deficiency.   He has a past surgical history that includes colon resectomy (1998); Appendectomy; Laparoscopic subtotal colectomy (N/A, 09/17/2016); Proctoscopy  (09/17/2016); Inguinal hernia repair; Upper gi endoscopy; polyp removal at [place] (2017); Flexible sigmoidoscopy (N/A, 05/19/2017); Esophagogastroduodenoscopy (egd) with propofol (N/A, 05/19/2017); Colonoscopy; Transforaminal lumbar interbody fusion w/ mis 1 level (Right, 02/02/2021); Polypectomy; Sigmoidoscopy; Upper gastrointestinal endoscopy; Flexible sigmoidoscopy (N/A, 06/04/2021); and Esophagogastroduodenoscopy (egd) with propofol (N/A, 06/04/2021).   His family history includes Colon cancer in his father.He reports that he quit smoking about 8 months ago. His smoking use included cigarettes. He has a 23.00 pack-year smoking history. He has never used smokeless tobacco. He reports previous alcohol use of about 24.0 standard drinks of alcohol per week. He reports that he does not use drugs.    ROS Review of Systems  Constitutional:  Negative for fever.  Respiratory:  Negative for shortness of breath.   Cardiovascular:  Negative for chest pain.  Musculoskeletal:  Negative for arthralgias.  Skin:  Negative for rash.   Objective:  Temp (!) 97.2 F (36.2 C)   Ht 5\' 9"  (1.753 m)   Wt 223 lb 9.6 oz (101.4 kg)   BMI 33.02 kg/m   BP Readings from Last 3 Encounters:  06/04/21 121/89  04/22/21 120/70  02/26/21 (!) 108/59    Wt Readings from Last 3 Encounters:  06/22/21 223 lb 9.6 oz (101.4 kg)  06/04/21 229 lb 15 oz (104.3 kg)  04/22/21 232 lb 2 oz (105.3 kg)     Physical Exam Vitals reviewed.  Constitutional:      Appearance: He is well-developed.  HENT:     Head: Normocephalic and atraumatic.     Right Ear: External ear normal.  Left Ear: External ear normal.     Mouth/Throat:     Pharynx: No oropharyngeal exudate or posterior oropharyngeal erythema.  Eyes:     Pupils: Pupils are equal, round, and reactive to light.  Cardiovascular:     Rate and Rhythm: Normal rate and regular rhythm.     Heart sounds: No murmur heard. Pulmonary:     Effort: No respiratory distress.      Breath sounds: Normal breath sounds.  Musculoskeletal:     Cervical back: Normal range of motion and neck supple.  Neurological:     Mental Status: He is alert and oriented to person, place, and time.      Assessment & Plan:   Charles Santos was seen today for medical management of chronic issues.  Diagnoses and all orders for this visit:  Essential hypertension -     chlorthalidone (HYGROTON) 25 MG tablet; Take 1 tablet (25 mg total) by mouth daily.  Mixed hyperlipidemia -     gemfibrozil (LOPID) 600 MG tablet; Take 1 tablet (600 mg total) by mouth in the morning and at bedtime.  Other orders -     naproxen (NAPROSYN) 500 MG tablet; Take 1 tablet (500 mg total) by mouth 2 (two) times daily. -     pantoprazole (PROTONIX) 40 MG tablet; Take 1 tablet (40 mg total) by mouth daily. -     gabapentin (NEURONTIN) 600 MG tablet; Take one in the morning and two at bedtime      I have changed Charles Santos gemfibrozil, pantoprazole, and gabapentin. I am also having him maintain his multivitamin with minerals, Fish Oil, potassium chloride SA, atorvastatin, acetaminophen, QUEtiapine, chlorthalidone, and naproxen.  Allergies as of 06/22/2021       Reactions   Oxycodone Other (See Comments)   unspecified   Adhesive [tape] Rash, Other (See Comments)   Prefers paper tape        Medication List        Accurate as of June 22, 2021  2:32 PM. If you have any questions, ask your nurse or doctor.          acetaminophen 500 MG tablet Commonly known as: TYLENOL Take 500-1,000 mg by mouth every 6 (six) hours as needed (pain.).   atorvastatin 40 MG tablet Commonly known as: LIPITOR Take 40 mg by mouth in the morning.   chlorthalidone 25 MG tablet Commonly known as: HYGROTON Take 1 tablet (25 mg total) by mouth daily. What changed: when to take this   Fish Oil 1200 MG Caps Take 1,200 mg by mouth in the morning.   gabapentin 600 MG tablet Commonly known as: NEURONTIN Take one in  the morning and two at bedtime What changed:  how much to take how to take this when to take this additional instructions Changed by: Claretta Fraise, MD   gemfibrozil 600 MG tablet Commonly known as: LOPID Take 1 tablet (600 mg total) by mouth in the morning and at bedtime. What changed: See the new instructions. Changed by: Claretta Fraise, MD   multivitamin with minerals Tabs tablet Take 1 tablet by mouth daily. What changed: when to take this   naproxen 500 MG tablet Commonly known as: NAPROSYN Take 1 tablet (500 mg total) by mouth 2 (two) times daily.   pantoprazole 40 MG tablet Commonly known as: PROTONIX Take 1 tablet (40 mg total) by mouth daily.   potassium chloride SA 20 MEQ tablet Commonly known as: KLOR-CON TAKE  (1)  TABLET  THREE TIMES  DAILY. What changed: additional instructions   QUEtiapine 100 MG tablet Commonly known as: SEROQUEL TAKE ONE TABLET AT BEDTIME         Follow-up: No follow-ups on file.  Claretta Fraise, M.D.

## 2021-06-23 ENCOUNTER — Encounter: Payer: Self-pay | Admitting: Family Medicine

## 2021-06-23 ENCOUNTER — Telehealth: Payer: Self-pay | Admitting: *Deleted

## 2021-06-23 LAB — CMP14+EGFR
ALT: 44 IU/L (ref 0–44)
AST: 65 IU/L — ABNORMAL HIGH (ref 0–40)
Albumin/Globulin Ratio: 1.7 (ref 1.2–2.2)
Albumin: 4.5 g/dL (ref 4.0–5.0)
Alkaline Phosphatase: 106 IU/L (ref 44–121)
BUN/Creatinine Ratio: 12 (ref 9–20)
BUN: 12 mg/dL (ref 6–24)
Bilirubin Total: 0.8 mg/dL (ref 0.0–1.2)
CO2: 23 mmol/L (ref 20–29)
Calcium: 9.8 mg/dL (ref 8.7–10.2)
Chloride: 99 mmol/L (ref 96–106)
Creatinine, Ser: 0.99 mg/dL (ref 0.76–1.27)
Globulin, Total: 2.7 g/dL (ref 1.5–4.5)
Glucose: 105 mg/dL — ABNORMAL HIGH (ref 65–99)
Potassium: 3.9 mmol/L (ref 3.5–5.2)
Sodium: 140 mmol/L (ref 134–144)
Total Protein: 7.2 g/dL (ref 6.0–8.5)
eGFR: 94 mL/min/{1.73_m2} (ref >59)

## 2021-06-23 LAB — CBC WITH DIFFERENTIAL/PLATELET
Basophils Absolute: 0.1 10*3/uL (ref 0.0–0.2)
Basos: 1 %
EOS (ABSOLUTE): 0.3 10*3/uL (ref 0.0–0.4)
Eos: 5 %
Hematocrit: 48.2 % (ref 37.5–51.0)
Hemoglobin: 17.2 g/dL (ref 13.0–17.7)
Immature Grans (Abs): 0 10*3/uL (ref 0.0–0.1)
Immature Granulocytes: 0 %
Lymphocytes Absolute: 1.2 10*3/uL (ref 0.7–3.1)
Lymphs: 24 %
MCH: 33 pg (ref 26.6–33.0)
MCHC: 35.7 g/dL (ref 31.5–35.7)
MCV: 92 fL (ref 79–97)
Monocytes Absolute: 0.5 10*3/uL (ref 0.1–0.9)
Monocytes: 11 %
Neutrophils Absolute: 3 10*3/uL (ref 1.4–7.0)
Neutrophils: 59 %
Platelets: 180 10*3/uL (ref 150–450)
RBC: 5.22 x10E6/uL (ref 4.14–5.80)
RDW: 13.2 % (ref 11.6–15.4)
WBC: 5.1 10*3/uL (ref 3.4–10.8)

## 2021-06-23 LAB — LIPID PANEL
Chol/HDL Ratio: 3.3 ratio (ref 0.0–5.0)
Cholesterol, Total: 175 mg/dL (ref 100–199)
HDL: 53 mg/dL (ref >39)
LDL Chol Calc (NIH): 55 mg/dL (ref 0–99)
Triglycerides: 453 mg/dL — ABNORMAL HIGH (ref 0–149)
VLDL Cholesterol Cal: 67 mg/dL — ABNORMAL HIGH (ref 5–40)

## 2021-06-23 NOTE — Telephone Encounter (Signed)
TAke the gabapentin with supper instead

## 2021-06-23 NOTE — Telephone Encounter (Signed)
Called patient with lab results. Patient wants to let Dr Livia Snellen know that Gabapentin at bedtime reacted with the Seroquel and kept him up all night.

## 2021-06-24 NOTE — Telephone Encounter (Signed)
LEFT DETAILED VOICEMAIL 

## 2021-07-10 ENCOUNTER — Other Ambulatory Visit: Payer: Self-pay | Admitting: Family Medicine

## 2021-08-19 ENCOUNTER — Ambulatory Visit: Payer: 59 | Admitting: Family Medicine

## 2021-09-23 ENCOUNTER — Other Ambulatory Visit: Payer: Self-pay

## 2021-09-23 ENCOUNTER — Ambulatory Visit (INDEPENDENT_AMBULATORY_CARE_PROVIDER_SITE_OTHER): Payer: 59 | Admitting: Family Medicine

## 2021-09-23 ENCOUNTER — Encounter: Payer: Self-pay | Admitting: Family Medicine

## 2021-09-23 VITALS — BP 122/65 | HR 83 | Temp 97.9°F | Ht 69.0 in | Wt 224.6 lb

## 2021-09-23 DIAGNOSIS — Z0001 Encounter for general adult medical examination with abnormal findings: Secondary | ICD-10-CM | POA: Diagnosis not present

## 2021-09-23 DIAGNOSIS — M5416 Radiculopathy, lumbar region: Secondary | ICD-10-CM

## 2021-09-23 DIAGNOSIS — E782 Mixed hyperlipidemia: Secondary | ICD-10-CM

## 2021-09-23 DIAGNOSIS — I10 Essential (primary) hypertension: Secondary | ICD-10-CM

## 2021-09-23 DIAGNOSIS — Z1509 Genetic susceptibility to other malignant neoplasm: Secondary | ICD-10-CM

## 2021-09-23 DIAGNOSIS — Z0000 Encounter for general adult medical examination without abnormal findings: Secondary | ICD-10-CM

## 2021-09-23 MED ORDER — PREGABALIN 200 MG PO CAPS: 200.0000 mg | ORAL_CAPSULE | Freq: Every day | ORAL | 1 refills | Status: DC

## 2021-09-23 NOTE — Progress Notes (Signed)
Subjective:  Patient ID: Charles Santos, male    DOB: 1971/08/09  Age: 50 y.o. MRN: [id number]  CC: Annual Exam   HPI Charles Santos presents for Complete physical. Changed diet. Laying off of fatty lunch food. Sees oily food in his stool. Drinking 2 Pepsis & two [facility] a day. Also a lot of water. Gabapentin taken as directed keeps him awake at night.  Depression screen [facility] 2/9 09/23/2021 06/22/2021 02/26/2021 02/16/2021 01/29/2021  Decreased Interest 0 0 0 0 0  Down, Depressed, Hopeless 0 0 0 0 0  PHQ - 2 Score 0 0 0 0 0  Altered sleeping - - - - -  Tired, decreased energy - - - - -  Change in appetite - - - - -  Feeling bad or failure about yourself  - - - - -  Trouble concentrating - - - - -  Moving slowly or fidgety/restless - - - - -  Suicidal thoughts - - - - -  PHQ-9 Score - - - - -  Difficult doing work/chores - - - - -  Some recent data might be hidden       Depression screen [facility] 2/9 09/23/2021 06/22/2021 02/26/2021  Decreased Interest 0 0 0  Down, Depressed, Hopeless 0 0 0  PHQ - 2 Score 0 0 0  Altered sleeping - - -  Tired, decreased energy - - -  Change in appetite - - -  Feeling bad or failure about yourself  - - -  Trouble concentrating - - -  Moving slowly or fidgety/restless - - -  Suicidal thoughts - - -  PHQ-9 Score - - -  Difficult doing work/chores - - -  Some recent data might be hidden    History Charles Santos has a past medical history of Acute midline low back pain with bilateral sciatica (05/16/2020), Anxiety, Cancer of transverse colon ([place]) (09/17/2016), Colon cancer ([place]) (8676), Complication of anesthesia, Depression, Emphysema of lung ([place]), Family hx of colon cancer, GERD (gastroesophageal reflux disease), Headache, Hyperlipidemia, Hypertension, and Vitamin D deficiency.   He has a past surgical history that includes colon resectomy (1998); Appendectomy; Laparoscopic subtotal colectomy (N/A, 09/17/2016); Proctoscopy (09/17/2016); Inguinal hernia repair;  Upper gi endoscopy; polyp removal at [place] (2017); Flexible sigmoidoscopy (N/A, 05/19/2017); Esophagogastroduodenoscopy (egd) with propofol (N/A, 05/19/2017); Colonoscopy; Transforaminal lumbar interbody fusion w/ mis 1 level (Right, 02/02/2021); Polypectomy; Sigmoidoscopy; Upper gastrointestinal endoscopy; Flexible sigmoidoscopy (N/A, 06/04/2021); and Esophagogastroduodenoscopy (egd) with propofol (N/A, 06/04/2021).   His family history includes Colon cancer in his father.He reports that he quit smoking about a year ago. His smoking use included cigarettes. He has a 23.00 pack-year smoking history. He has never used smokeless tobacco. He reports that he does not currently use alcohol after a past usage of about 24.0 standard drinks per week. He reports that he does not use drugs.    ROS Review of Systems  Constitutional:  Negative for activity change, fatigue and unexpected weight change.  HENT:  Negative for congestion, ear pain, hearing loss, postnasal drip and trouble swallowing.   Eyes:  Negative for pain and visual disturbance.  Respiratory:  Negative for cough, chest tightness and shortness of breath.   Cardiovascular:  Negative for chest pain, palpitations and leg swelling.  Gastrointestinal:  Negative for abdominal distention, abdominal pain, blood in stool, constipation, diarrhea, nausea and vomiting.  Endocrine: Negative for cold intolerance, heat intolerance and polydipsia.  Genitourinary:  Positive for frequency (drinks a lot of fluids at work).  Negative for difficulty urinating, dysuria, flank pain and urgency.  Musculoskeletal:  Positive for arthralgias (hips, with exertion. Occurs about twice a week.) and back pain. Negative for joint swelling.  Skin:  Negative for color change, rash and wound.  Neurological:  Positive for numbness (at lumbar midline incisions.) and headaches (rare). Negative for dizziness, syncope, speech difficulty, weakness and light-headedness.  Hematological:   Does not bruise/bleed easily.  Psychiatric/Behavioral:  Negative for confusion, decreased concentration, dysphoric mood and sleep disturbance. The patient is not nervous/anxious.    Objective:  BP 122/65   Pulse 83   Temp 97.9 F (36.6 C)   Ht 5' 9"  (1.753 m)   Wt 224 lb 9.6 oz (101.9 kg)   SpO2 98%   BMI 33.17 kg/m   BP Readings from Last 3 Encounters:  09/23/21 122/65  06/04/21 121/89  04/22/21 120/70    Wt Readings from Last 3 Encounters:  09/23/21 224 lb 9.6 oz (101.9 kg)  06/22/21 223 lb 9.6 oz (101.4 kg)  06/04/21 229 lb 15 oz (104.3 kg)     Physical Exam Constitutional:      Appearance: He is well-developed.  HENT:     Head: Normocephalic and atraumatic.  Eyes:     Pupils: Pupils are equal, round, and reactive to light.  Neck:     Thyroid: No thyromegaly.     Trachea: No tracheal deviation.  Cardiovascular:     Rate and Rhythm: Normal rate and regular rhythm.     Heart sounds: Normal heart sounds. No murmur heard.   No friction rub. No gallop.  Pulmonary:     Breath sounds: Normal breath sounds. No wheezing or rales.  Abdominal:     General: Bowel sounds are normal. There is no distension.     Palpations: Abdomen is soft. There is no mass.     Tenderness: There is no abdominal tenderness.     Hernia: There is no hernia in the left inguinal area.  Genitourinary:    Penis: Normal.      Testes: Normal.  Musculoskeletal:        General: Normal range of motion.     Cervical back: Normal range of motion.  Lymphadenopathy:     Cervical: No cervical adenopathy.  Skin:    General: Skin is warm and dry.  Neurological:     Mental Status: He is alert and oriented to person, place, and time.  Psychiatric:        Mood and Affect: Mood normal.        Behavior: Behavior normal.      Assessment & Plan:   Ronn was seen today for annual exam.  Diagnoses and all orders for this visit:  Well adult exam  Mixed hyperlipidemia -     Lipid panel -      Urinalysis  Essential hypertension -     CBC with Differential/Platelet -     CMP14+EGFR -     Urinalysis  Other orders -     pregabalin (LYRICA) 200 MG capsule; Take 1 capsule (200 mg total) by mouth at bedtime. For pain relief      I am having Jeanmarie Plant start on pregabalin. I am also having him maintain his multivitamin with minerals, Fish Oil, potassium chloride SA, acetaminophen, QUEtiapine, chlorthalidone, gemfibrozil, naproxen, pantoprazole, gabapentin, and atorvastatin.  Allergies as of 09/23/2021       Reactions   Oxycodone Other (See Comments)   unspecified   Adhesive [tape] Rash, Other (See Comments)  Prefers paper tape        Medication List        Accurate as of September 23, 2021  1:48 PM. If you have any questions, ask your nurse or doctor.          acetaminophen 500 MG tablet Commonly known as: TYLENOL Take 500-1,000 mg by mouth every 6 (six) hours as needed (pain.).   atorvastatin 40 MG tablet Commonly known as: LIPITOR TAKE 1 TABLET DAILY   chlorthalidone 25 MG tablet Commonly known as: HYGROTON Take 1 tablet (25 mg total) by mouth daily.   Fish Oil 1200 MG Caps Take 1,200 mg by mouth in the morning.   gabapentin 600 MG tablet Commonly known as: NEURONTIN Take one in the morning and two at bedtime   gemfibrozil 600 MG tablet Commonly known as: LOPID Take 1 tablet (600 mg total) by mouth in the morning and at bedtime.   multivitamin with minerals Tabs tablet Take 1 tablet by mouth daily. What changed: when to take this   naproxen 500 MG tablet Commonly known as: NAPROSYN Take 1 tablet (500 mg total) by mouth 2 (two) times daily.   pantoprazole 40 MG tablet Commonly known as: PROTONIX Take 1 tablet (40 mg total) by mouth daily.   potassium chloride SA 20 MEQ tablet Commonly known as: KLOR-CON TAKE  (1)  TABLET  THREE TIMES DAILY. What changed: additional instructions   pregabalin 200 MG capsule Commonly known as:  Lyrica Take 1 capsule (200 mg total) by mouth at bedtime. For pain relief Started by: Claretta Fraise, MD   QUEtiapine 100 MG tablet Commonly known as: SEROQUEL TAKE ONE TABLET AT BEDTIME         Follow-up: Return in about 6 months (around 03/24/2022).  Claretta Fraise, M.D.

## 2021-09-24 ENCOUNTER — Other Ambulatory Visit: Payer: 59

## 2021-09-24 LAB — URINALYSIS
Bilirubin, UA: NEGATIVE
Glucose, UA: NEGATIVE
Ketones, UA: NEGATIVE
Leukocytes,UA: NEGATIVE
Nitrite, UA: NEGATIVE
Protein,UA: NEGATIVE
RBC, UA: NEGATIVE
Specific Gravity, UA: >1.03 — ABNORMAL HIGH (ref 1.005–1.030)
Urobilinogen, Ur: 0.2 mg/dL (ref 0.2–1.0)
pH, UA: 5.5 (ref 5.0–7.5)

## 2021-09-24 NOTE — Progress Notes (Signed)
Hello Charles Santos,  Your lab result is normal and/or stable.Some minor variations that are not significant are commonly marked abnormal, but do not represent any medical problem for you.  Best regards, Tenna Lacko, M.D.

## 2021-09-25 LAB — CMP14+EGFR
ALT: 36 IU/L (ref 0–44)
AST: 42 IU/L — ABNORMAL HIGH (ref 0–40)
Albumin/Globulin Ratio: 1.6 (ref 1.2–2.2)
Albumin: 4.2 g/dL (ref 4.0–5.0)
Alkaline Phosphatase: 113 IU/L (ref 44–121)
BUN/Creatinine Ratio: 20 (ref 9–20)
BUN: 20 mg/dL (ref 6–24)
Bilirubin Total: 0.6 mg/dL (ref 0.0–1.2)
CO2: 26 mmol/L (ref 20–29)
Calcium: 9.5 mg/dL (ref 8.7–10.2)
Chloride: 94 mmol/L — ABNORMAL LOW (ref 96–106)
Creatinine, Ser: 1 mg/dL (ref 0.76–1.27)
Globulin, Total: 2.7 g/dL (ref 1.5–4.5)
Glucose: 111 mg/dL — ABNORMAL HIGH (ref 70–99)
Potassium: 3.4 mmol/L — ABNORMAL LOW (ref 3.5–5.2)
Sodium: 137 mmol/L (ref 134–144)
Total Protein: 6.9 g/dL (ref 6.0–8.5)
eGFR: 92 mL/min/{1.73_m2} (ref >59)

## 2021-09-25 LAB — CBC WITH DIFFERENTIAL/PLATELET
Basophils Absolute: 0.1 10*3/uL (ref 0.0–0.2)
Basos: 1 %
EOS (ABSOLUTE): 0.2 10*3/uL (ref 0.0–0.4)
Eos: 4 %
Hematocrit: 47.2 % (ref 37.5–51.0)
Hemoglobin: 16 g/dL (ref 13.0–17.7)
Immature Grans (Abs): 0 10*3/uL (ref 0.0–0.1)
Immature Granulocytes: 0 %
Lymphocytes Absolute: 1.6 10*3/uL (ref 0.7–3.1)
Lymphs: 29 %
MCH: 33 pg (ref 26.6–33.0)
MCHC: 33.9 g/dL (ref 31.5–35.7)
MCV: 97 fL (ref 79–97)
Monocytes Absolute: 0.7 10*3/uL (ref 0.1–0.9)
Monocytes: 12 %
Neutrophils Absolute: 3 10*3/uL (ref 1.4–7.0)
Neutrophils: 54 %
Platelets: 190 10*3/uL (ref 150–450)
RBC: 4.85 x10E6/uL (ref 4.14–5.80)
RDW: 13.1 % (ref 11.6–15.4)
WBC: 5.6 10*3/uL (ref 3.4–10.8)

## 2021-09-25 LAB — LIPID PANEL
Chol/HDL Ratio: 2.8 ratio (ref 0.0–5.0)
Cholesterol, Total: 146 mg/dL (ref 100–199)
HDL: 53 mg/dL (ref >39)
LDL Chol Calc (NIH): 34 mg/dL (ref 0–99)
Triglycerides: 414 mg/dL — ABNORMAL HIGH (ref 0–149)
VLDL Cholesterol Cal: 59 mg/dL — ABNORMAL HIGH (ref 5–40)

## 2021-09-25 NOTE — Progress Notes (Signed)
Pt rc for labs

## 2021-09-28 ENCOUNTER — Telehealth: Payer: Self-pay | Admitting: Family Medicine

## 2021-09-28 NOTE — Telephone Encounter (Signed)
Pt calling back about labs. Nothing under result notes. Please call back and advise.

## 2021-09-28 NOTE — Telephone Encounter (Signed)
Patient aware and verbalized understanding. °

## 2021-10-02 ENCOUNTER — Other Ambulatory Visit: Payer: Self-pay | Admitting: Family Medicine

## 2021-11-19 IMAGING — CT CT HEAD W/O CM
4 series · 16 of 47 positions shown, 18 images · non-contrast
Comparison: None.

CLINICAL DATA: Mental status changes of unknown cause.

EXAM:
CT HEAD WITHOUT CONTRAST
TECHNIQUE: Contiguous axial images were obtained from the base of the skull
through the vertex without intravenous contrast.

[Series 3: head wo · axial · 0.45mm/px · z∈[-76,+34]mm · 7 of 30 slices shown, 9 images]
[im 4/30  brain]
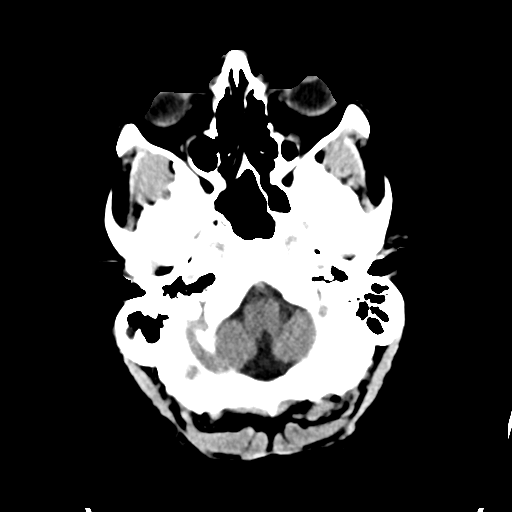
[im 4/30  bone]
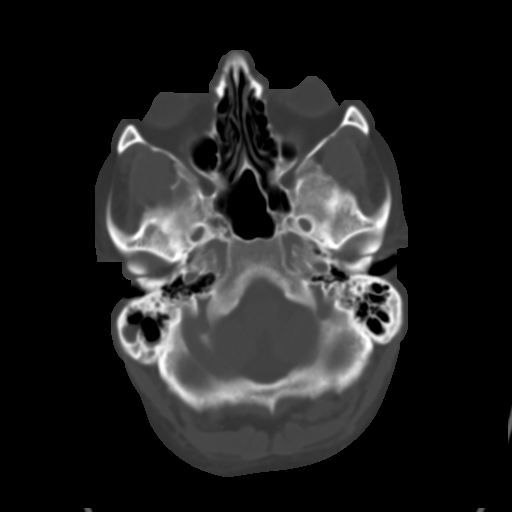
[im 8/30  brain]
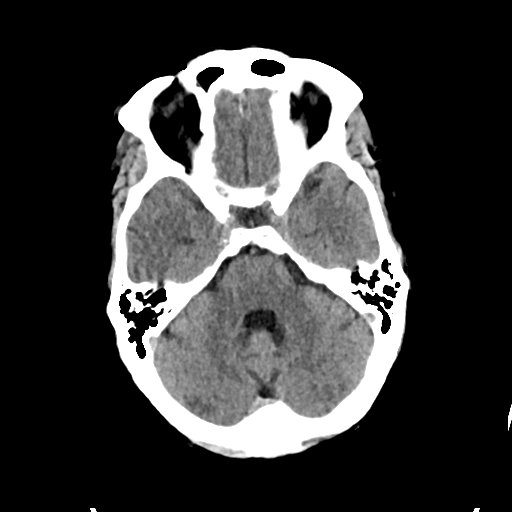
[im 11/30  brain]
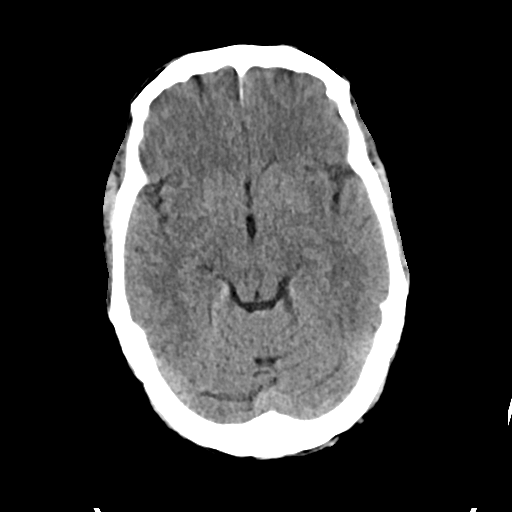
[im 15/30  brain]
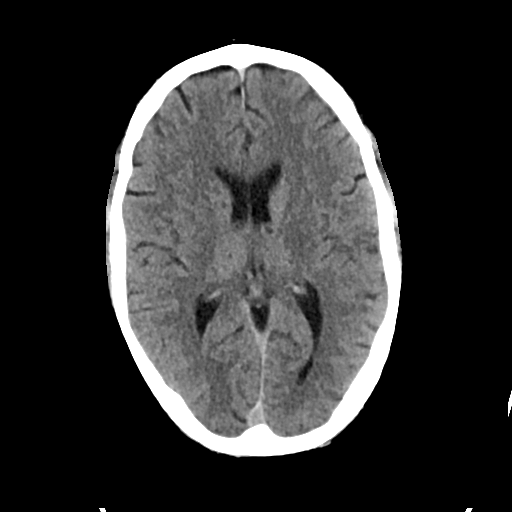
[im 19/30  brain]
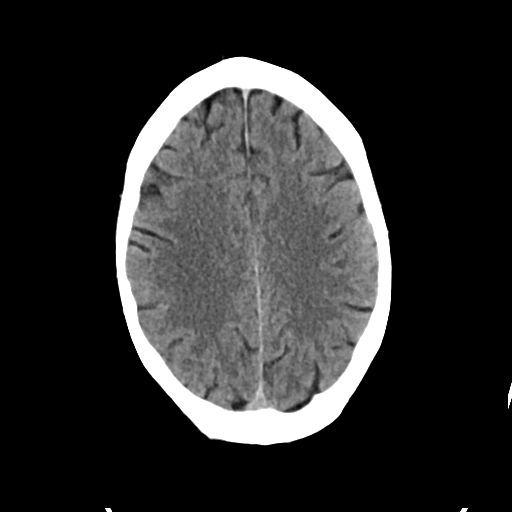
[im 19/30  bone]
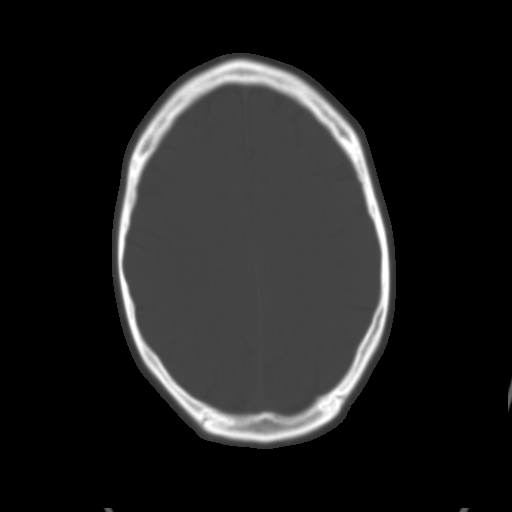
[im 22/30  brain]
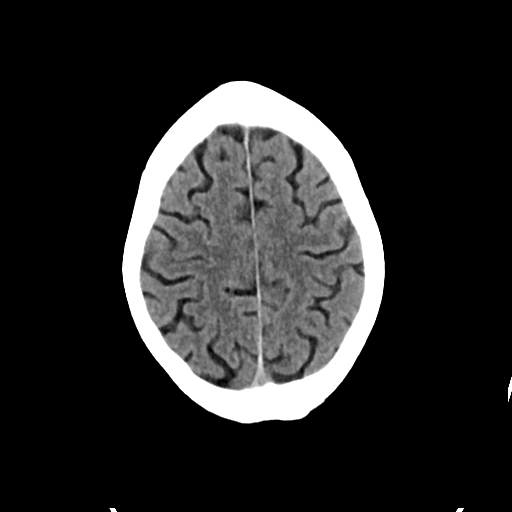
[im 26/30  brain]
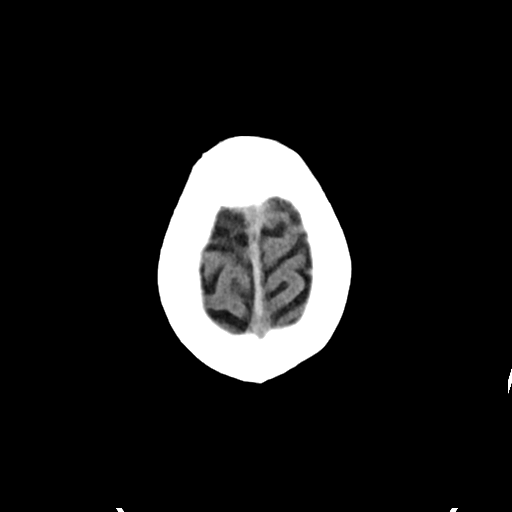

[Series 4: head bone · axial · 0.45mm/px · z∈[-78,-50]mm · 3 of 74 slices shown]
[im 8/74  bone]
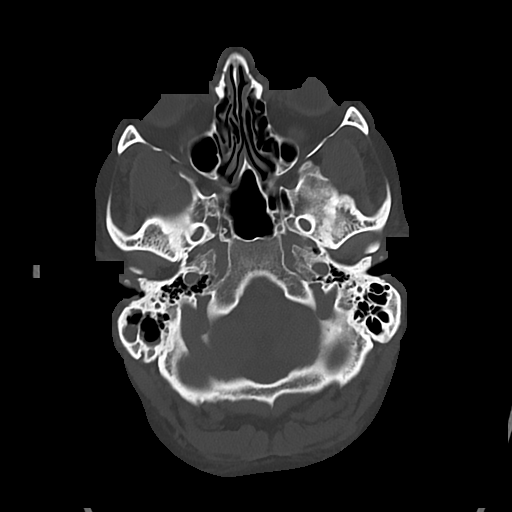
[im 15/74  bone]
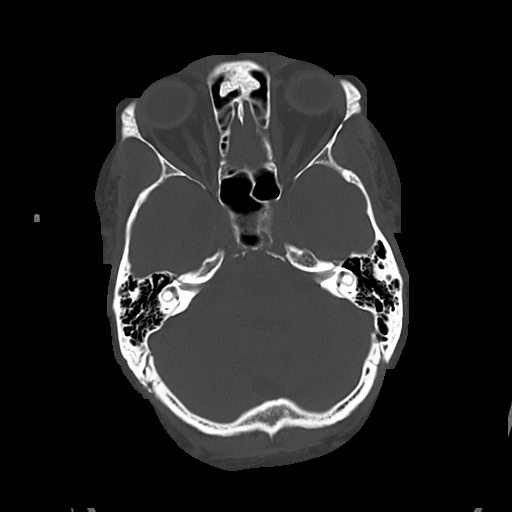
[im 22/74  bone]
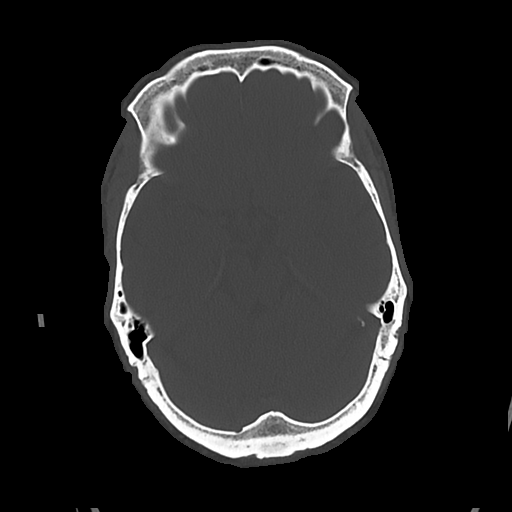

[Series 5: cor soft · coronal · 0.31mm/px · 3 of 69 slices shown]
[im 23/69  brain]
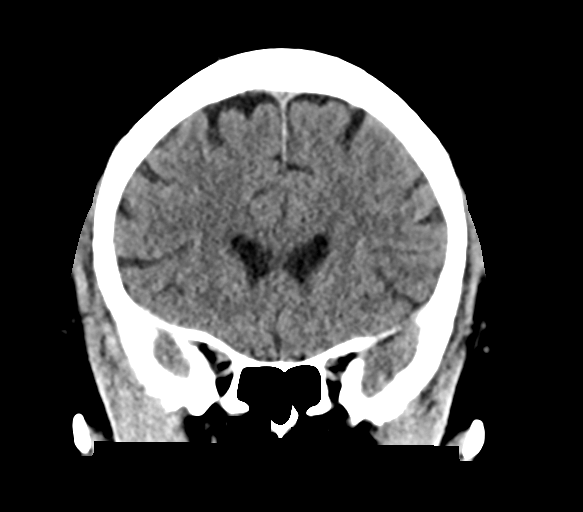
[im 31/69  brain]
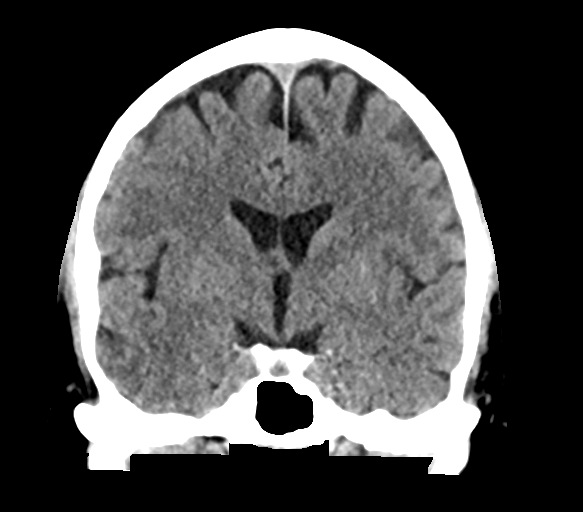
[im 38/69  brain]
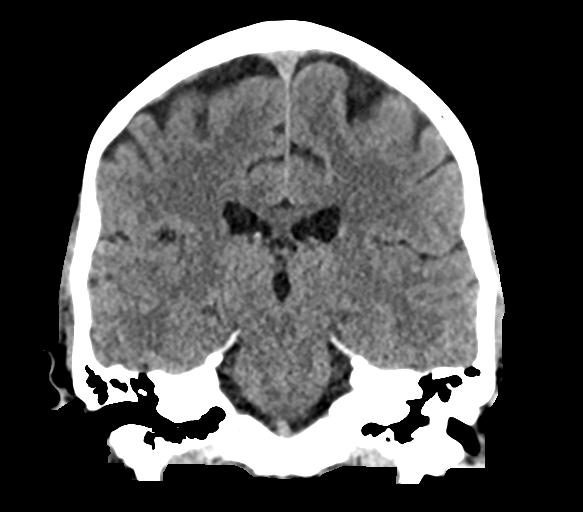

[Series 6: sag soft · sagittal · 0.31mm/px · 3 of 60 slices shown]
[im 20/60  brain]
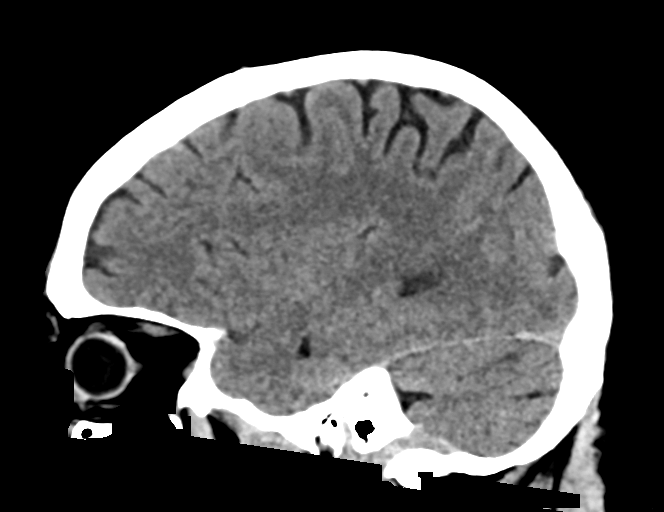
[im 30/60  brain]
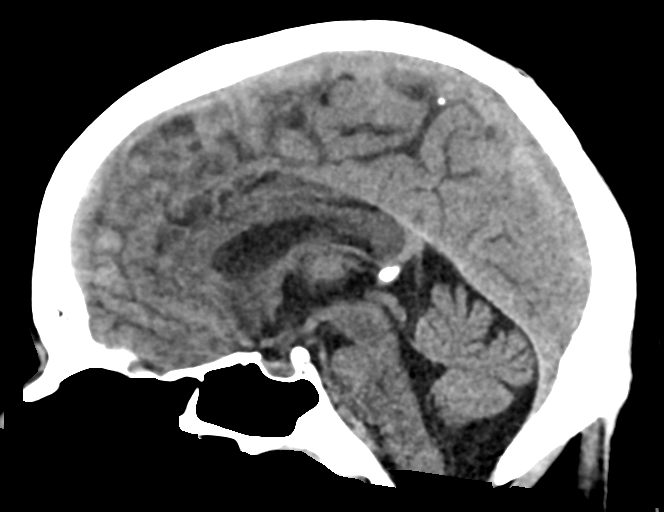
[im 40/60  brain]
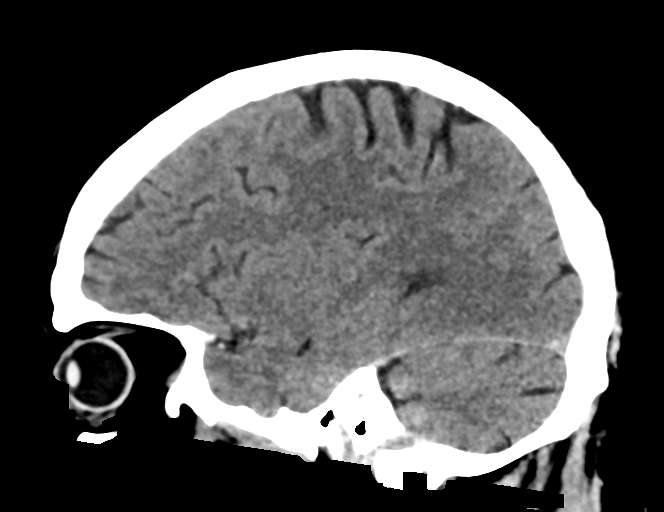

[16 of 47 positions shown; findings below may reference images not displayed]

FINDINGS: Brain: The brain shows a normal appearance without evidence of
malformation, atrophy, old or acute small or large vessel
infarction, mass lesion, hemorrhage, hydrocephalus or extra-axial
collection.

Vascular: No hyperdense vessel. No evidence of atherosclerotic
calcification.

Skull: Normal.  No traumatic finding.  No focal bone lesion.

Sinuses/Orbits: Sinuses are clear. Orbits appear normal. Mastoids
are clear.

Other: None significant
IMPRESSION: Normal head CT.

## 2021-11-20 IMAGING — DX DG ABD PORTABLE 1V
1 series · 1 of 1 positions shown · non-contrast
Comparison: None.

CLINICAL DATA: Ileus

EXAM:
PORTABLE ABDOMEN - 1 VIEW

[abdomen kub]
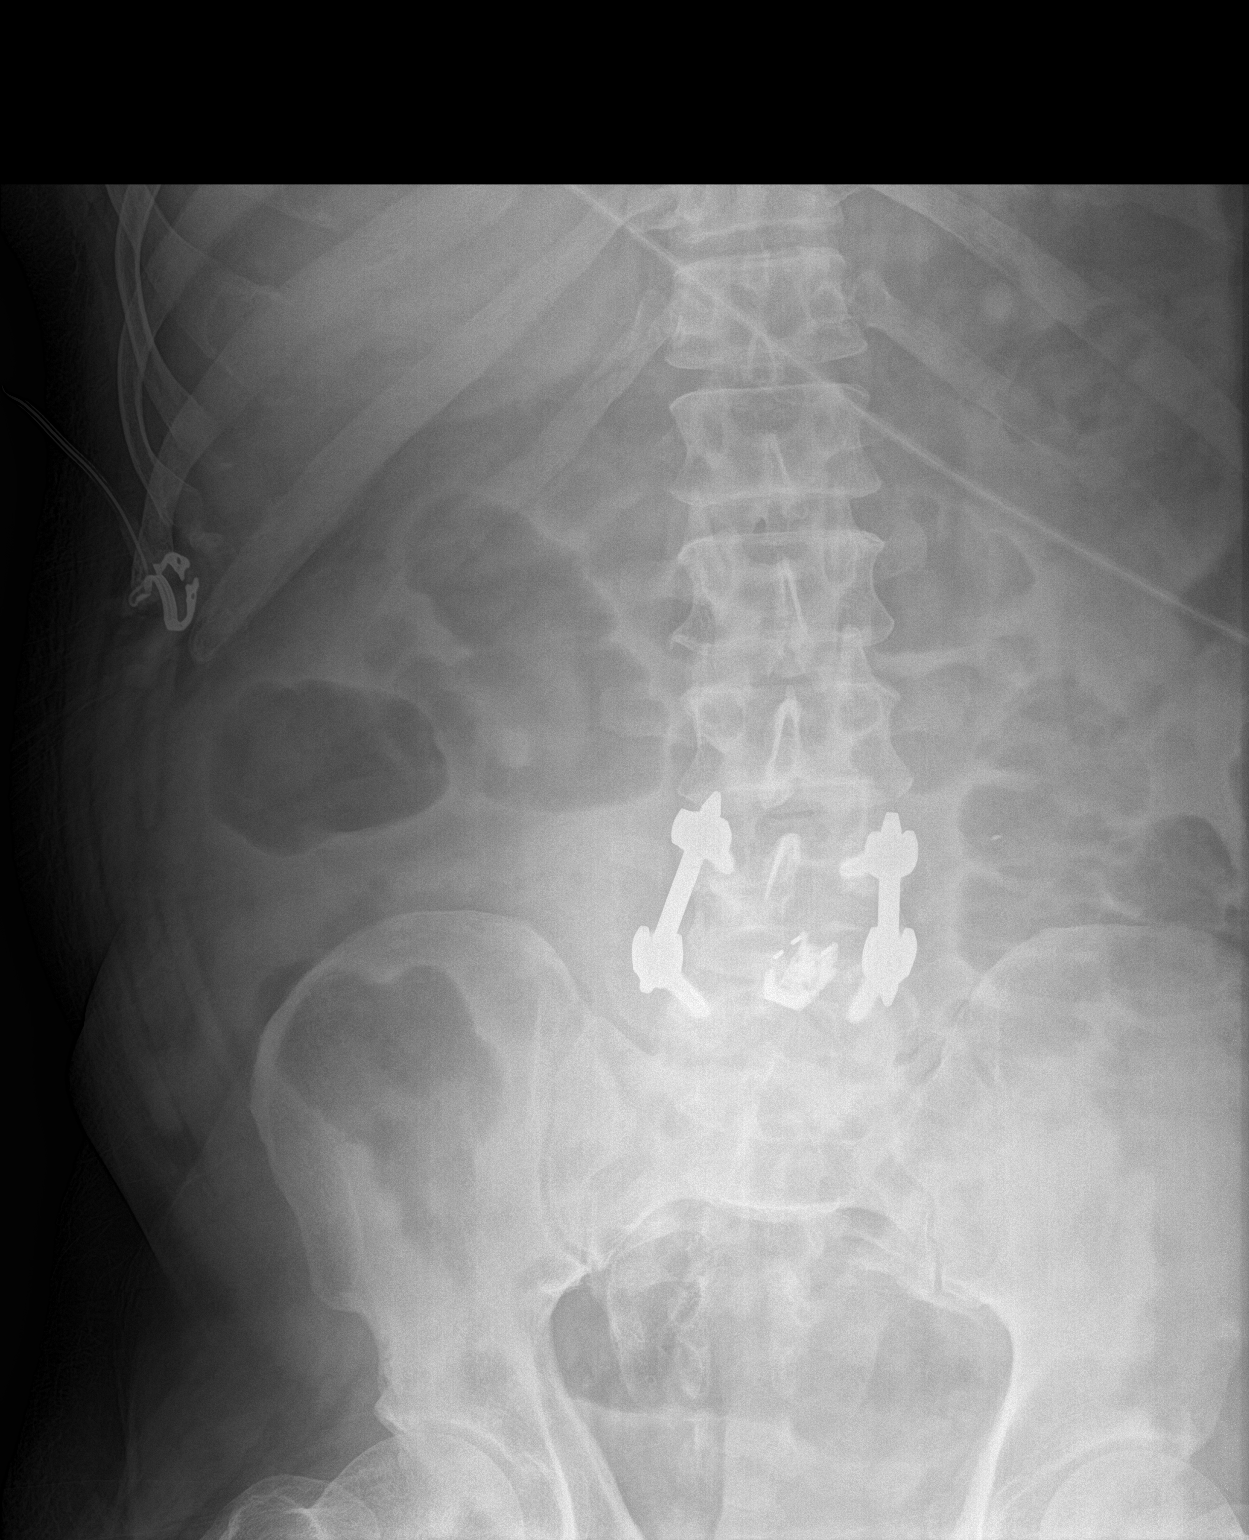

[1 of 1 positions shown; findings below may reference images not displayed]

FINDINGS: Left flank and inferior pelvis are excluded from view. Gaseous
distension of the small bowel has improved, though has not yet
completely resolved in keeping with an improving small-bowel
obstruction or ileus. Small amount of gas is now identified within
the probable right colon. No gross free intraperitoneal gas. L4-5
lumbar fusion with instrumentation has been performed.
IMPRESSION: Improving gaseous distension of the small bowel in keeping with a
resolving small bowel obstruction or ileus.

## 2021-11-21 IMAGING — DX DG ABDOMEN 1V
1 series · 1 of 1 positions shown · non-contrast
Comparison: Single-view of the abdomen 02/08/2021 and 02/07/2021.

CLINICAL DATA: Patient status post L4-5 fusion 02/02/2021. Patient
admitted to the hospital with delirium 02/03/2021. Abdominal
discomfort.

EXAM:
ABDOMEN - 1 VIEW

[abdomen kub]
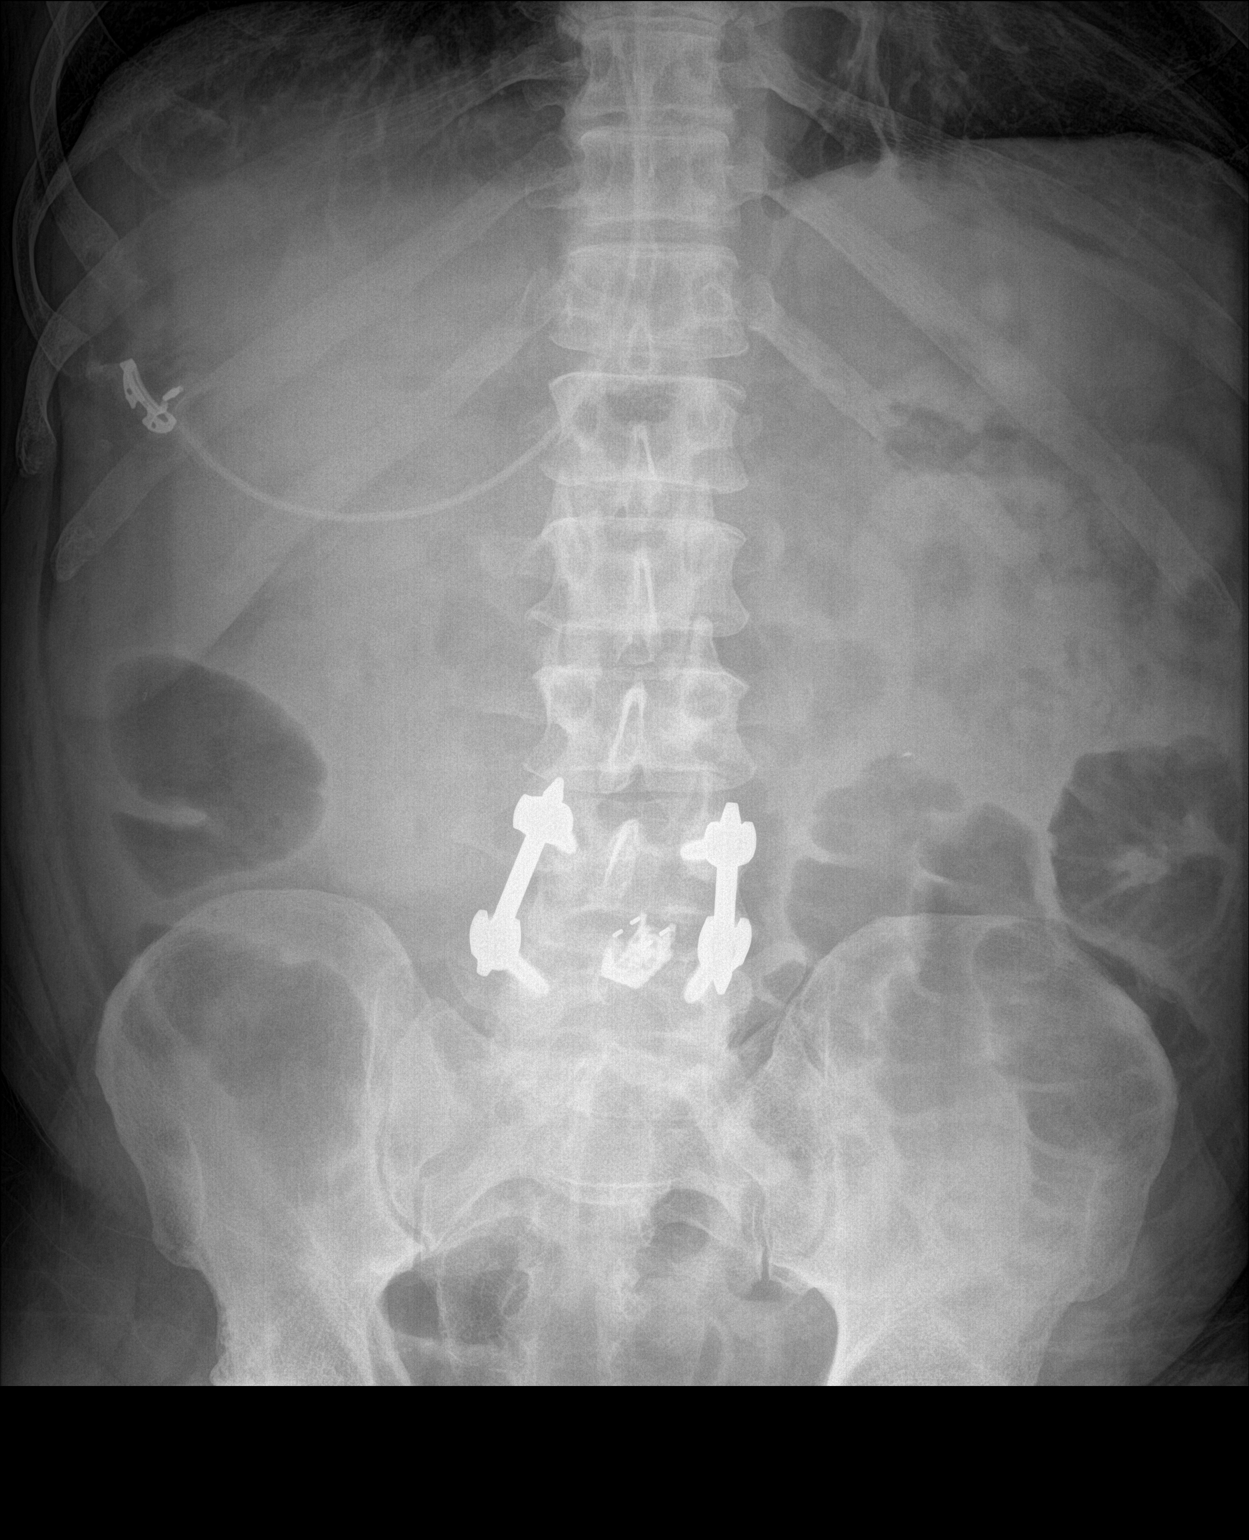

[1 of 1 positions shown; findings below may reference images not displayed]

FINDINGS: There has been continued improvement and mild gaseous distention of
small bowel. There is some gas in the ascending colon.
IMPRESSION: Mild improvement in ileus.  No new abnormality.

## 2021-12-21 ENCOUNTER — Other Ambulatory Visit: Payer: Self-pay | Admitting: Family Medicine

## 2021-12-22 ENCOUNTER — Ambulatory Visit (INDEPENDENT_AMBULATORY_CARE_PROVIDER_SITE_OTHER): Payer: 59 | Admitting: Nurse Practitioner

## 2021-12-22 ENCOUNTER — Encounter: Payer: Self-pay | Admitting: Nurse Practitioner

## 2021-12-22 VITALS — BP 132/80 | HR 80 | Temp 98.6°F | Resp 20 | Ht 69.0 in | Wt 235.0 lb

## 2021-12-22 DIAGNOSIS — I10 Essential (primary) hypertension: Secondary | ICD-10-CM | POA: Diagnosis not present

## 2021-12-22 DIAGNOSIS — R079 Chest pain, unspecified: Secondary | ICD-10-CM | POA: Diagnosis not present

## 2021-12-22 NOTE — Progress Notes (Signed)
° °  Subjective:    Patient ID: Charles Santos, male    DOB: 1971/11/06, 51 y.o.   MRN: [id number]   Chief Complaint: Epistaxis (On Saturday) and Dizziness   Epistaxis   Dizziness Associated symptoms include chest pain.  Patient comes in today stating that on Saturday he was dizzy and had a bloody nose. Has not had since the. Nose bleed lasted about 30 minutes. He was at work when this occurred.  He has a history of hypertension and is on clorthalidone. He says that he had some chest pan during episode. Describes pain as sharpness that would come an go.   BP Readings from Last 3 Encounters:  12/22/21 (!) 144/80  09/23/21 122/65  06/04/21 121/89     Review of Systems  Constitutional: Negative.   HENT:  Positive for nosebleeds.   Respiratory: Negative.    Cardiovascular:  Positive for chest pain. Negative for palpitations and leg swelling.  Neurological:  Positive for dizziness.      Objective:   Physical Exam Vitals and nursing note reviewed.  Constitutional:      Appearance: Normal appearance. He is well-developed.  Neck:     Thyroid: No thyroid mass or thyromegaly.     Vascular: No carotid bruit or JVD.     Trachea: Phonation normal.  Cardiovascular:     Rate and Rhythm: Normal rate and regular rhythm.  Pulmonary:     Effort: Pulmonary effort is normal. No respiratory distress.     Breath sounds: Normal breath sounds.  Abdominal:     General: Bowel sounds are normal.     Palpations: Abdomen is soft.     Tenderness: There is no abdominal tenderness.  Musculoskeletal:        General: Normal range of motion.     Cervical back: Normal range of motion and neck supple.  Lymphadenopathy:     Cervical: No cervical adenopathy.  Skin:    General: Skin is warm and dry.  Neurological:     Mental Status: He is alert and oriented to person, place, and time.  Psychiatric:        Behavior: Behavior normal.        Thought Content: Thought content normal.        Judgment:  Judgment normal.   Ekg- NSR-Mary-Margaret Hassell Done, FNP  BP 132/80    Pulse 80    Temp 98.6 F (37 C) (Temporal)    Resp 20    Ht 5\' 9"  (1.753 m)    Wt 235 lb (106.6 kg)    SpO2 92%    BMI 34.70 kg/m          Assessment & Plan:  Charles Santos in today with chief complaint of Epistaxis (On Saturday) and Dizziness   1. Chest pain, unspecified type Referral to cardiology If chest pain develops again go to the ED - EKG 12-Lead - Ambulatory referral to Cardiology  2. Essential hypertension Keep a diary of blood pressures at home and bring with you to next PCP appointmnet Dash diet    The above assessment and management plan was discussed with the patient. The patient verbalized understanding of and has agreed to the management plan. Patient is aware to call the clinic if symptoms persist or worsen. Patient is aware when to return to the clinic for a follow-up visit. Patient educated on when it is appropriate to go to the emergency department.   Mary-Margaret Hassell Done, FNP

## 2021-12-22 NOTE — Patient Instructions (Signed)
Chest Wall Pain Chest wall pain is pain in or around the bones and muscles of your chest. Chest wall pain may be caused by: An injury. Coughing a lot. Using your chest and arm muscles too much. Sometimes, the cause may not be known. This pain may take a few weeks or longer to get better. Follow these instructions at home: Managing pain, stiffness, and swelling If told, put ice on the painful area: Put ice in a plastic bag. Place a towel between your skin and the bag. Leave the ice on for 20 minutes, 2-3 times a day.  Activity Rest as told by your doctor. Avoid doing things that cause pain. This includes lifting heavy items. Ask your doctor what activities are safe for you. General instructions  Take over-the-counter and prescription medicines only as told by your doctor. Do not use any products that contain nicotine or tobacco, such as cigarettes, e-cigarettes, and chewing tobacco. If you need help quitting, ask your doctor. Keep all follow-up visits as told by your doctor. This is important. Contact a doctor if: You have a fever. Your chest pain gets worse. You have new symptoms. Get help right away if: You feel sick to your stomach (nauseous) or you throw up (vomit). You feel sweaty or light-headed. You have a cough with mucus from your lungs (sputum) or you cough up blood. You are short of breath. These symptoms may be an emergency. Do not wait to see if the symptoms will go away. Get medical help right away. Call your local emergency services (911 in the [place] Do not drive yourself to the hospital. Summary Chest wall pain is pain in or around the bones and muscles of your chest. It may be treated with ice, rest, and medicines. Your condition may also get better if you avoid doing things that cause pain. Contact a doctor if you have a fever, chest pain that gets worse, or new symptoms. Get help right away if you feel light-headed or you get short of breath. These symptoms may  be an emergency. This information is not intended to replace advice given to you by your health care provider. Make sure you discuss any questions you have with your health care provider. Document Revised: 03/03/2021 Document Reviewed: 02/13/2021 Elsevier Patient Education  [place].

## 2022-01-04 ENCOUNTER — Other Ambulatory Visit: Payer: Self-pay | Admitting: Neurological Surgery

## 2022-01-04 DIAGNOSIS — M48062 Spinal stenosis, lumbar region with neurogenic claudication: Secondary | ICD-10-CM

## 2022-01-18 ENCOUNTER — Ambulatory Visit: Payer: 59 | Admitting: Internal Medicine

## 2022-01-27 ENCOUNTER — Ambulatory Visit (INDEPENDENT_AMBULATORY_CARE_PROVIDER_SITE_OTHER): Payer: 59 | Admitting: Internal Medicine

## 2022-01-27 ENCOUNTER — Other Ambulatory Visit: Payer: Self-pay

## 2022-01-27 ENCOUNTER — Encounter: Payer: Self-pay | Admitting: Internal Medicine

## 2022-01-27 VITALS — BP 126/62 | HR 81 | Ht 70.0 in | Wt 241.4 lb

## 2022-01-27 DIAGNOSIS — R079 Chest pain, unspecified: Secondary | ICD-10-CM | POA: Diagnosis not present

## 2022-01-27 NOTE — Progress Notes (Signed)
Cardiology Office Note:    Date:  01/27/2022   ID:  Charles Santos, DOB 1971-01-30, MRN [id number]  PCP:  Claretta Fraise, MD   [place] [facility] Cardiologist:  Janina Mayo, MD     Referring MD: Hassell Done, Owen Pagnotta-Margaret, *   No chief complaint on file. Chest pain  History of Present Illness:    Charles Santos is a 51 y.o. male with a hx of htn, lynch syndrome; colon cancer s/p partial colon resection, alcohol abuse c/b admission for encephalopathy, emphysema, HLD on atorvastatin referral from PCP for chest pain   Patient stated that he has nose bleeds with associated dizziness. He felt hot. He said an ache in the chest that lasted 3-5 minutes and it resolved. He melts down copper. He does lifting. He denies chest pain on exertion and shortness of breath. He quit smoking cigarettes a year ago, he smoked from age 65. He has no persistent LH, dizziness or syncope. No premature family hx of CAD.He has no DM2. He has no hypertension.    EKG 12/22/2021- NSR, IRBBB  Past Medical History:  Diagnosis Date   Acute midline low back pain with bilateral sciatica 05/16/2020   Anxiety    Cancer of transverse colon ([place]) 09/17/2016   surgery done   Colon cancer [facility]) 1998   surgery and chemo   Complication of anesthesia    not fully under for polyp removal at [place] 2017   Depression    Emphysema of lung ([place])    Family hx of colon cancer    GERD (gastroesophageal reflux disease)    none recent   Headache    Hyperlipidemia    Hypertension    Vitamin D deficiency     Past Surgical History:  Procedure Laterality Date   APPENDECTOMY     colon resectomy  1998   2 degree herida   COLONOSCOPY     ESOPHAGOGASTRODUODENOSCOPY (EGD) WITH PROPOFOL N/A 05/19/2017   Procedure: ESOPHAGOGASTRODUODENOSCOPY (EGD) WITH PROPOFOL;  Surgeon: [name], MD;  [facility]: [facility];  Service: Endoscopy;  Laterality: N/A;   ESOPHAGOGASTRODUODENOSCOPY (EGD) WITH PROPOFOL N/A 06/04/2021    Procedure: ESOPHAGOGASTRODUODENOSCOPY (EGD) WITH PROPOFOL;  Surgeon: [name], MD;  [facility];  Service: Endoscopy;  Laterality: N/A;   FLEXIBLE SIGMOIDOSCOPY N/A 05/19/2017   Procedure: FLEXIBLE SIGMOIDOSCOPY;  Surgeon: [name], MD;  [facility];  Service: Endoscopy;  Laterality: N/A;   FLEXIBLE SIGMOIDOSCOPY N/A 06/04/2021   Procedure: FLEXIBLE SIGMOIDOSCOPY;  Surgeon: [name], MD;  [facility];  Service: Endoscopy;  Laterality: N/A;   INGUINAL HERNIA REPAIR     LAPAROSCOPIC SUBTOTAL COLECTOMY N/A 09/17/2016   Procedure: DIAGNOSTIC LAPAROSCOPY EXPLORATORY LAPAROTOMY SUBTOTAL COLECTOMY PROCTOSCOPY;  Surgeon: [name], MD;  Location: [facility];  Service: General;  Laterality: N/A;   polyp removal at [place]  2017   POLYPECTOMY     PROCTOSCOPY  09/17/2016   Procedure: PROCTOSCOPY;  Surgeon: [name], MD;  Location: [facility];  Service: General;;   SIGMOIDOSCOPY     TRANSFORAMINAL LUMBAR INTERBODY FUSION W/ MIS 1 LEVEL Right 02/02/2021   Procedure: Right Lumbar four -lumbar five minimally invasive transforaminal lumbar interbody fusion;  Surgeon: Judith Part, MD;  Location: [place];  Service: Neurosurgery;  Laterality: Right;   UPPER GASTROINTESTINAL ENDOSCOPY     UPPER GI ENDOSCOPY      Current Medications: Current Meds  Medication Sig   acetaminophen (TYLENOL) 500 MG tablet Take 500-1,000 mg by  mouth every 6 (six) hours as needed (pain.).   atorvastatin (LIPITOR) 40 MG tablet TAKE 1 TABLET DAILY   chlorthalidone (HYGROTON) 25 MG tablet Take 1 tablet (25 mg total) by mouth daily.   gemfibrozil (LOPID) 600 MG tablet Take 1 tablet (600 mg total) by mouth in the morning and at bedtime.   Multiple Vitamin (MULTIVITAMIN WITH MINERALS) TABS tablet Take 1 tablet by mouth daily. (Patient taking differently: Take 1 tablet by mouth in the morning.)   naproxen (NAPROSYN) 500 MG tablet Take 1 tablet (500 mg total) by mouth 2 (two)  times daily.   Omega-3 Fatty Acids (FISH OIL) 1200 MG CAPS Take 1,200 mg by mouth in the morning.   pantoprazole (PROTONIX) 40 MG tablet Take 1 tablet (40 mg total) by mouth daily.   potassium chloride SA (KLOR-CON M) 20 MEQ tablet TAKE ONE TABLET THREE TIMES DAILY   pregabalin (LYRICA) 200 MG capsule Take 1 capsule (200 mg total) by mouth at bedtime. For pain relief   QUEtiapine (SEROQUEL) 100 MG tablet TAKE ONE TABLET AT BEDTIME     Allergies:   Oxycodone and Adhesive [tape]   Social History   Socioeconomic History   Marital status: Divorced    Spouse name: Not on file   Number of children: Not on file   Years of education: Not on file   Highest education level: Not on file  Occupational History   Not on file  Tobacco Use   Smoking status: Former    Packs/day: 1.00    Years: 23.00    Pack years: 23.00    Types: Cigarettes    Quit date: 10/07/2020    Years since quitting: 1.3   Smokeless tobacco: Never  Vaping Use   Vaping Use: Never used  Substance and Sexual Activity   Alcohol use: Not Currently    Alcohol/week: 24.0 standard drinks    Types: 24 Cans of beer per week   Drug use: No   Sexual activity: Not on file  Other Topics Concern   Not on file  Social History Narrative   Not on file   Social Determinants of Health   Financial Resource Strain: Not on file  Food Insecurity: Not on file  Transportation Needs: Not on file  Physical Activity: Not on file  Stress: Not on file  Social Connections: Not on file     Family History: The patient's family history includes Colon cancer in his father. There is no history of Esophageal cancer, Rectal cancer, Stomach cancer, Prostate cancer, or Colon polyps.  ROS:   Please see the history of present illness.     All other systems reviewed and are negative.  EKGs/Labs/Other Studies Reviewed:    The following studies were reviewed today:   EKG:  EKG is  ordered today.  The ekg ordered today demonstrates   NSR,  IRBBB  Recent Labs: 02/07/2021: TSH 0.649 02/11/2021: Magnesium 2.0 09/24/2021: ALT 36; BUN 20; Creatinine, Ser 1.00; Hemoglobin 16.0; Platelets 190; Potassium 3.4; Sodium 137  Recent Lipid Panel    Component Value Date/Time   CHOL 146 09/24/2021 1128   TRIG 414 (H) 09/24/2021 1128   HDL 53 09/24/2021 1128   CHOLHDL 2.8 09/24/2021 1128   LDLCALC 34 09/24/2021 1128   LDLDIRECT 34 12/31/2016 1551     Risk Assessment/Calculations:           Physical Exam:    VS:   Vitals:   01/27/22 1348  BP: 126/62  Pulse: 81  SpO2: 97%      BP 126/62    Pulse 81    Ht 5\' 10"  (1.778 m)    Wt 241 lb 6.4 oz (109.5 kg)    SpO2 97%    BMI 34.64 kg/m     Wt Readings from Last 3 Encounters:  01/27/22 241 lb 6.4 oz (109.5 kg)  12/22/21 235 lb (106.6 kg)  09/23/21 224 lb 9.6 oz (101.9 kg)     GEN:  Well nourished, well developed in no acute distress HEENT: Normal NECK: No JVD; No carotid bruits LYMPHATICS: No lymphadenopathy CARDIAC: RRR, no murmurs, rubs, gallops RESPIRATORY:  Clear to auscultation without rales, wheezing or rhonchi  ABDOMEN: Soft, non-tender, non-distended MUSCULOSKELETAL:  No edema; No deformity  SKIN: Warm and dry NEUROLOGIC:  Alert and oriented x 3 PSYCHIATRIC:  Normal affect   ASSESSMENT:    #Atypical Chest pain: he had brief chest ache that is not persistent. This is atypical chest pain that is non cardiac related. His LDL is at goal. His triglycerides are improved, up to 2000s prior. Can continue statin. Considering his symptoms are non cardiac related, he does not need to follow-up.If he were to develop symptoms of angina or new diagnosis of cardiac disease, will be happy to see him again  PLAN:    In order of problems listed above:  Follow up prn        Medication Adjustments/Labs and Tests Ordered: Current medicines are reviewed at length with the patient today.  Concerns regarding medicines are outlined above.  Orders Placed This Encounter   Procedures   EKG 12-Lead   No orders of the defined types were placed in this encounter.   Patient Instructions  Medication Instructions:  No Changes In Medications at this time.  *If you need a refill on your cardiac medications before your next appointment, please call your pharmacy*  Follow-Up: At [facility], you and your health needs are our priority.  As part of our continuing mission to provide you with exceptional heart care, we have created designated Provider Care Teams.  These Care Teams include your primary Cardiologist (physician) and Advanced Practice Providers (APPs -  Physician Assistants and Nurse Practitioners) who all work together to provide you with the care you need, when you need it.  Your next appointment:   AS NEEDED   The format for your next appointment:   In Person  Provider:   Janina Mayo, MD     Signed, Janina Mayo, MD  01/27/2022 2:41 PM    [place]

## 2022-01-27 NOTE — Patient Instructions (Signed)

## 2022-02-01 ENCOUNTER — Ambulatory Visit
Admission: RE | Admit: 2022-02-01 | Discharge: 2022-02-01 | Disposition: A | Discharge status: Home / Self Care | Payer: 59 | Source: Ambulatory Visit | Attending: Neurological Surgery | Admitting: Neurological Surgery

## 2022-02-01 ENCOUNTER — Other Ambulatory Visit: Payer: Self-pay

## 2022-02-01 DIAGNOSIS — M48062 Spinal stenosis, lumbar region with neurogenic claudication: Secondary | ICD-10-CM

## 2022-02-15 ENCOUNTER — Telehealth: Payer: Self-pay | Admitting: Family Medicine

## 2022-02-15 NOTE — Telephone Encounter (Signed)
Patient contacted appt made for Mar 21st at 3:25 pm, urgent care follow up ear infection ?

## 2022-02-25 ENCOUNTER — Other Ambulatory Visit: Payer: Self-pay | Admitting: Family Medicine

## 2022-03-02 ENCOUNTER — Encounter: Payer: Self-pay | Admitting: Family Medicine

## 2022-03-02 ENCOUNTER — Ambulatory Visit (INDEPENDENT_AMBULATORY_CARE_PROVIDER_SITE_OTHER): Payer: 59 | Admitting: Family Medicine

## 2022-03-02 VITALS — BP 128/61 | HR 85 | Temp 97.9°F | Ht 70.0 in | Wt 238.0 lb

## 2022-03-02 DIAGNOSIS — H6692 Otitis media, unspecified, left ear: Secondary | ICD-10-CM

## 2022-03-02 DIAGNOSIS — E782 Mixed hyperlipidemia: Secondary | ICD-10-CM

## 2022-03-02 DIAGNOSIS — I10 Essential (primary) hypertension: Secondary | ICD-10-CM

## 2022-03-02 DIAGNOSIS — M5416 Radiculopathy, lumbar region: Secondary | ICD-10-CM

## 2022-03-02 MED ORDER — PREGABALIN 200 MG PO CAPS: 200.0000 mg | ORAL_CAPSULE | Freq: Every day | ORAL | 1 refills | Status: DC

## 2022-03-02 MED ORDER — CEFPROZIL 500 MG PO TABS: 500.0000 mg | ORAL_TABLET | Freq: Two times a day (BID) | ORAL | 0 refills | Status: DC

## 2022-03-02 NOTE — Progress Notes (Signed)
? ?Subjective:  ?Patient ID: Charles Santos, male    DOB: 02-Sep-1971  Age: 51 y.o. MRN: [id number] ? ?CC: Medical Management of Chronic Issues ? ? ?HPI ?[name] presents for  presents for  follow-up of hypertension. Patient has no history of headache chest pain or shortness of breath or recent cough. Patient also denies symptoms of TIA such as focal numbness or weakness. Patient denies side effects from medication. States taking it regularly. ? ? in for follow-up of elevated cholesterol. Doing well without complaints on current medication. Denies side effects of statin including myalgia and arthralgia and nausea. Currently no chest pain, shortness of breath or other cardiovascular related symptoms noted. ? ?Back pain better. Less radiation ? ?Depression screen [facility] 2/9 03/02/2022 12/22/2021 09/23/2021  ?Decreased Interest 0 0 0  ?Down, Depressed, Hopeless 0 0 0  ?PHQ - 2 Score 0 0 0  ?Altered sleeping - - -  ?Tired, decreased energy - - -  ?Change in appetite - - -  ?Feeling bad or failure about yourself  - - -  ?Trouble concentrating - - -  ?Moving slowly or fidgety/restless - - -  ?Suicidal thoughts - - -  ?PHQ-9 Score - - -  ?Difficult doing work/chores - - -  ?Some recent data might be hidden  ? ? ?History ?Charles Santos has a past medical history of Acute midline low back pain with bilateral sciatica (05/16/2020), Anxiety, Cancer of transverse colon ([place]) (09/17/2016), Colon cancer ([place]) (5053), Complication of anesthesia, Depression, Emphysema of lung ([place]), Family hx of colon cancer, GERD (gastroesophageal reflux disease), Headache, Hyperlipidemia, Hypertension, and Vitamin D deficiency.  ? ?He has a past surgical history that includes colon resectomy (1998); Appendectomy; Laparoscopic subtotal colectomy (N/A, 09/17/2016); Proctoscopy (09/17/2016); Inguinal hernia repair; Upper gi endoscopy; polyp removal at [place] (2017); Flexible sigmoidoscopy (N/A, 05/19/2017); Esophagogastroduodenoscopy (egd) with propofol (N/A,  05/19/2017); Colonoscopy; Transforaminal lumbar interbody fusion w/ mis 1 level (Right, 02/02/2021); Polypectomy; Sigmoidoscopy; Upper gastrointestinal endoscopy; Flexible sigmoidoscopy (N/A, 06/04/2021); and Esophagogastroduodenoscopy (egd) with propofol (N/A, 06/04/2021).  ? ?His family history includes Colon cancer in his father.He reports that he quit smoking about 16 months ago. His smoking use included cigarettes. He has a 23.00 pack-year smoking history. He has never used smokeless tobacco. He reports that he does not currently use alcohol after a past usage of about 24.0 standard drinks per week. He reports that he does not use drugs. ? ? ? ?ROS ?Review of Systems ? ?Objective:  ?BP 128/61   Pulse 85   Temp 97.9 ?F (36.6 ?C)   Ht '5\' 10"'$  (1.778 m)   Wt 238 lb (108 kg)   SpO2 97%   BMI 34.15 kg/m?  ? ?BP Readings from Last 3 Encounters:  ?03/02/22 128/61  ?01/27/22 126/62  ?12/22/21 132/80  ? ? ?Wt Readings from Last 3 Encounters:  ?03/02/22 238 lb (108 kg)  ?01/27/22 241 lb 6.4 oz (109.5 kg)  ?12/22/21 235 lb (106.6 kg)  ? ? ? ?Physical Exam ? ? ? ?Assessment & Plan:  ? ?Issaac was seen today for medical management of chronic issues. ? ?Diagnoses and all orders for this visit: ? ?Essential hypertension ? ?Mixed hyperlipidemia ? ? ? ? ? ? ?I am having Yandell Mcjunkins maintain his multivitamin with minerals, Fish Oil, acetaminophen, QUEtiapine, chlorthalidone, gemfibrozil, naproxen, pantoprazole, atorvastatin, pregabalin, and potassium chloride SA. ? ?Allergies as of 03/02/2022   ? ?   Reactions  ? Oxycodone Other (See Comments)  ? unspecified  ? Adhesive [tape] Rash,  Other (See Comments)  ? Prefers paper tape  ? ?  ? ?  ?Medication List  ?  ? ?  ? Accurate as of March 02, 2022  3:54 PM. If you have any questions, ask your nurse or doctor.  ?  ?  ? ?  ? ?acetaminophen 500 MG tablet ?Commonly known as: TYLENOL ?Take 500-1,000 mg by mouth every 6 (six) hours as needed (pain.). ?  ?atorvastatin 40 MG  tablet ?Commonly known as: LIPITOR ?TAKE 1 TABLET DAILY ?  ?chlorthalidone 25 MG tablet ?Commonly known as: HYGROTON ?Take 1 tablet (25 mg total) by mouth daily. ?  ?Fish Oil 1200 MG Caps ?Take 1,200 mg by mouth in the morning. ?  ?gemfibrozil 600 MG tablet ?Commonly known as: LOPID ?Take 1 tablet (600 mg total) by mouth in the morning and at bedtime. ?  ?multivitamin with minerals Tabs tablet ?Take 1 tablet by mouth daily. ?What changed: when to take this ?  ?naproxen 500 MG tablet ?Commonly known as: NAPROSYN ?Take 1 tablet (500 mg total) by mouth 2 (two) times daily. ?  ?pantoprazole 40 MG tablet ?Commonly known as: PROTONIX ?Take 1 tablet (40 mg total) by mouth daily. ?  ?potassium chloride SA 20 MEQ tablet ?Commonly known as: KLOR-CON M ?TAKE ONE TABLET THREE TIMES DAILY ?  ?pregabalin 200 MG capsule ?Commonly known as: Lyrica ?Take 1 capsule (200 mg total) by mouth at bedtime. For pain relief ?  ?QUEtiapine 100 MG tablet ?Commonly known as: SEROQUEL ?TAKE ONE TABLET AT BEDTIME ?  ? ?  ? ? ? ?Follow-up: No follow-ups on file. ? ?Claretta Fraise, M.D. ?

## 2022-03-02 NOTE — Progress Notes (Signed)
? ?Subjective:  ?Patient ID: Charles Santos, male    DOB: January 14, 1971  Age: 51 y.o. MRN: [id number] ? ?CC: Medical Management of Chronic Issues ? ? ?HPI ?Zalman Hull presents for  presents for  follow-up of hypertension. Patient has no history of headache chest pain or shortness of breath or recent cough. Patient also denies symptoms of TIA such as focal numbness or weakness. Patient denies side effects from medication. States taking it regularly. ? ? in for follow-up of elevated cholesterol. Doing well without complaints on current medication. Denies side effects of statin including myalgia and arthralgia and nausea. Currently no chest pain, shortness of breath or other cardiovascular related symptoms noted. ? ?Still having left ear pain after tx with amoxil. Feels like something in it. Recently had a clump of was removed.  ? ?Back doing much better with regard to pain. Taking the Lyrica as directed with good relief and no side effects. ? ?Depression screen [facility] 2/9 03/02/2022 12/22/2021 09/23/2021  ?Decreased Interest 0 0 0  ?Down, Depressed, Hopeless 0 0 0  ?PHQ - 2 Score 0 0 0  ?Altered sleeping - - -  ?Tired, decreased energy - - -  ?Change in appetite - - -  ?Feeling bad or failure about yourself  - - -  ?Trouble concentrating - - -  ?Moving slowly or fidgety/restless - - -  ?Suicidal thoughts - - -  ?PHQ-9 Score - - -  ?Difficult doing work/chores - - -  ?Some recent data might be hidden  ? ? ?History ?Blaiden has a past medical history of Acute midline low back pain with bilateral sciatica (05/16/2020), Anxiety, Cancer of transverse colon ([place]) (09/17/2016), Colon cancer ([place]) (6294), Complication of anesthesia, Depression, Emphysema of lung ([place]), Family hx of colon cancer, GERD (gastroesophageal reflux disease), Headache, Hyperlipidemia, Hypertension, and Vitamin D deficiency.  ? ?He has a past surgical history that includes colon resectomy (1998); Appendectomy; Laparoscopic subtotal colectomy (N/A, 09/17/2016);  Proctoscopy (09/17/2016); Inguinal hernia repair; Upper gi endoscopy; polyp removal at [place] (2017); Flexible sigmoidoscopy (N/A, 05/19/2017); Esophagogastroduodenoscopy (egd) with propofol (N/A, 05/19/2017); Colonoscopy; Transforaminal lumbar interbody fusion w/ mis 1 level (Right, 02/02/2021); Polypectomy; Sigmoidoscopy; Upper gastrointestinal endoscopy; Flexible sigmoidoscopy (N/A, 06/04/2021); and Esophagogastroduodenoscopy (egd) with propofol (N/A, 06/04/2021).  ? ?His family history includes Colon cancer in his father.He reports that he quit smoking about 16 months ago. His smoking use included cigarettes. He has a 23.00 pack-year smoking history. He has never used smokeless tobacco. He reports that he does not currently use alcohol after a past usage of about 24.0 standard drinks per week. He reports that he does not use drugs. ? ? ? ?ROS ?Review of Systems  ?Constitutional:  Negative for fever.  ?HENT:  Positive for ear pain.   ?Respiratory:  Negative for shortness of breath.   ?Cardiovascular:  Negative for chest pain.  ?Musculoskeletal:  Positive for back pain. Negative for arthralgias.  ?Skin:  Negative for rash.  ? ?Objective:  ?BP 128/61   Pulse 85   Temp 97.9 ?F (36.6 ?C)   Ht _0  (1.778 m)   Wt 238 lb (108 kg)   SpO2 97%   BMI 34.15 kg/m?  ? ?BP Readings from Last 3 Encounters:  ?03/02/22 128/61  ?01/27/22 126/62  ?12/22/21 132/80  ? ? ?Wt Readings from Last 3 Encounters:  ?03/02/22 238 lb (108 kg)  ?01/27/22 241 lb 6.4 oz (109.5 kg)  ?12/22/21 235 lb (106.6 kg)  ? ? ? ?Physical Exam ?Vitals reviewed.  ?  Constitutional:   ?   Appearance: He is well-developed.  ?HENT:  ?   Head: Normocephalic and atraumatic.  ?   Right Ear: Tympanic membrane and external ear normal.  ?   Left Ear: External ear normal.  ?   Ears:  ?   Comments: Left TM has mild erythema and a single hair laying against the tympanum, ? ?   Mouth/Throat:  ?   Pharynx: No oropharyngeal exudate or posterior oropharyngeal erythema.   ?Eyes:  ?   Pupils: Pupils are equal, round, and reactive to light.  ?Cardiovascular:  ?   Rate and Rhythm: Normal rate and regular rhythm.  ?   Heart sounds: No murmur heard. ?Pulmonary:  ?   Effort: No respiratory distress.  ?   Breath sounds: Normal breath sounds.  ?Musculoskeletal:  ?   Cervical back: Normal range of motion and neck supple.  ?Neurological:  ?   Mental Status: He is alert and oriented to person, place, and time.  ? ? ? ? ?Assessment & Plan:  ? ?Hattie was seen today for medical management of chronic issues. ? ?Diagnoses and all orders for this visit: ? ?Essential hypertension ?-     CBC with Differential/Platelet ?-     CMP14+EGFR ? ?Mixed hyperlipidemia ?-     Lipid panel ? ?Lumbar radiculopathy ? ?OM (otitis media), recurrent, left ? ?Other orders ?-     pregabalin (LYRICA) 200 MG capsule; Take 1 capsule (200 mg total) by mouth at bedtime. For pain relief ?-     cefPROZIL (CEFZIL) 500 MG tablet; Take 1 tablet (500 mg total) by mouth 2 (two) times daily. For infection. Take all of this medication. ? ? ? ? ? ? ?I am having Jeanmarie Plant start on cefPROZIL. I am also having him maintain his multivitamin with minerals, Fish Oil, acetaminophen, QUEtiapine, chlorthalidone, gemfibrozil, naproxen, pantoprazole, atorvastatin, potassium chloride SA, and pregabalin. ? ?Allergies as of 03/02/2022   ? ?   Reactions  ? Oxycodone Other (See Comments)  ? unspecified  ? Adhesive [tape] Rash, Other (See Comments)  ? Prefers paper tape  ? ?  ? ?  ?Medication List  ?  ? ?  ? Accurate as of March 02, 2022  5:14 PM. If you have any questions, ask your nurse or doctor.  ?  ?  ? ?  ? ?acetaminophen 500 MG tablet ?Commonly known as: TYLENOL ?Take 500-1,000 mg by mouth every 6 (six) hours as needed (pain.). ?  ?atorvastatin 40 MG tablet ?Commonly known as: LIPITOR ?TAKE 1 TABLET DAILY ?  ?cefPROZIL 500 MG tablet ?Commonly known as: CEFZIL ?Take 1 tablet (500 mg total) by mouth 2 (two) times daily. For infection. Take  all of this medication. ?Started by: Claretta Fraise, MD ?  ?chlorthalidone 25 MG tablet ?Commonly known as: HYGROTON ?Take 1 tablet (25 mg total) by mouth daily. ?  ?Fish Oil 1200 MG Caps ?Take 1,200 mg by mouth in the morning. ?  ?gemfibrozil 600 MG tablet ?Commonly known as: LOPID ?Take 1 tablet (600 mg total) by mouth in the morning and at bedtime. ?  ?multivitamin with minerals Tabs tablet ?Take 1 tablet by mouth daily. ?What changed: when to take this ?  ?naproxen 500 MG tablet ?Commonly known as: NAPROSYN ?Take 1 tablet (500 mg total) by mouth 2 (two) times daily. ?  ?pantoprazole 40 MG tablet ?Commonly known as: PROTONIX ?Take 1 tablet (40 mg total) by mouth daily. ?  ?potassium chloride SA 20 MEQ  tablet ?Commonly known as: KLOR-CON M ?TAKE ONE TABLET THREE TIMES DAILY ?  ?pregabalin 200 MG capsule ?Commonly known as: Lyrica ?Take 1 capsule (200 mg total) by mouth at bedtime. For pain relief ?  ?QUEtiapine 100 MG tablet ?Commonly known as: SEROQUEL ?TAKE ONE TABLET AT BEDTIME ?  ? ?  ? ? ? ?Follow-up: Return in about 6 months (around 09/02/2022) for [facility] physical. ? ?Claretta Fraise, M.D. ? ?

## 2022-03-05 ENCOUNTER — Other Ambulatory Visit: Payer: 59

## 2022-03-06 LAB — CBC WITH DIFFERENTIAL/PLATELET
Basophils Absolute: 0.1 10*3/uL (ref 0.0–0.2)
Basos: 1 %
EOS (ABSOLUTE): 0.2 10*3/uL (ref 0.0–0.4)
Eos: 3 %
Hematocrit: 49.1 % (ref 37.5–51.0)
Hemoglobin: 17.2 g/dL (ref 13.0–17.7)
Immature Grans (Abs): 0 10*3/uL (ref 0.0–0.1)
Immature Granulocytes: 0 %
Lymphocytes Absolute: 1.2 10*3/uL (ref 0.7–3.1)
Lymphs: 22 %
MCH: 33.1 pg — ABNORMAL HIGH (ref 26.6–33.0)
MCHC: 35 g/dL (ref 31.5–35.7)
MCV: 94 fL (ref 79–97)
Monocytes Absolute: 0.7 10*3/uL (ref 0.1–0.9)
Monocytes: 11 %
Neutrophils Absolute: 3.6 10*3/uL (ref 1.4–7.0)
Neutrophils: 63 %
Platelets: 191 10*3/uL (ref 150–450)
RBC: 5.2 x10E6/uL (ref 4.14–5.80)
RDW: 13.5 % (ref 11.6–15.4)
WBC: 5.7 10*3/uL (ref 3.4–10.8)

## 2022-03-06 LAB — LIPID PANEL
Chol/HDL Ratio: 3.9 ratio (ref 0.0–5.0)
Cholesterol, Total: 221 mg/dL — ABNORMAL HIGH (ref 100–199)
HDL: 57 mg/dL (ref >39)
LDL Chol Calc (NIH): 73 mg/dL (ref 0–99)
Triglycerides: 588 mg/dL (ref 0–149)
VLDL Cholesterol Cal: 91 mg/dL — ABNORMAL HIGH (ref 5–40)

## 2022-03-06 LAB — CMP14+EGFR
ALT: 37 IU/L (ref 0–44)
AST: 46 IU/L — ABNORMAL HIGH (ref 0–40)
Albumin/Globulin Ratio: 1.4 (ref 1.2–2.2)
Albumin: 4.3 g/dL (ref 4.0–5.0)
Alkaline Phosphatase: 101 IU/L (ref 44–121)
BUN/Creatinine Ratio: 17 (ref 9–20)
BUN: 18 mg/dL (ref 6–24)
Bilirubin Total: 0.7 mg/dL (ref 0.0–1.2)
CO2: 23 mmol/L (ref 20–29)
Calcium: 9.4 mg/dL (ref 8.7–10.2)
Chloride: 100 mmol/L (ref 96–106)
Creatinine, Ser: 1.07 mg/dL (ref 0.76–1.27)
Globulin, Total: 3.1 g/dL (ref 1.5–4.5)
Glucose: 124 mg/dL — ABNORMAL HIGH (ref 70–99)
Potassium: 3.1 mmol/L — ABNORMAL LOW (ref 3.5–5.2)
Sodium: 137 mmol/L (ref 134–144)
Total Protein: 7.4 g/dL (ref 6.0–8.5)
eGFR: 85 mL/min/{1.73_m2} (ref >59)

## 2022-03-07 ENCOUNTER — Other Ambulatory Visit: Payer: Self-pay | Admitting: Family Medicine

## 2022-03-07 MED ORDER — FENOFIBRATE 160 MG PO TABS: 160.0000 mg | ORAL_TABLET | Freq: Every day | ORAL | 3 refills | Status: DC

## 2022-03-11 ENCOUNTER — Telehealth: Payer: Self-pay | Admitting: Family Medicine

## 2022-03-11 NOTE — Telephone Encounter (Signed)
PT R/C 

## 2022-03-11 NOTE — Telephone Encounter (Signed)
na

## 2022-03-11 NOTE — Telephone Encounter (Signed)
Pt aware he is supposed to stop gemfibrozil and he is to take fenofibrate and atorvastatin.  ?

## 2022-03-11 NOTE — Telephone Encounter (Signed)
PT PHONE CONTINUOUSLY RUNG  ?

## 2022-03-24 ENCOUNTER — Ambulatory Visit: Payer: 59 | Admitting: Family Medicine

## 2022-04-29 ENCOUNTER — Encounter: Payer: Self-pay | Admitting: Gastroenterology

## 2022-05-05 ENCOUNTER — Other Ambulatory Visit: Payer: Self-pay | Admitting: Family Medicine

## 2022-05-07 ENCOUNTER — Encounter: Payer: Self-pay | Admitting: Gastroenterology

## 2022-05-26 ENCOUNTER — Telehealth: Payer: Self-pay | Admitting: *Deleted

## 2022-05-26 NOTE — Telephone Encounter (Signed)
[name],  This pt is due for a recall flex sigmoidoscopy.  He has a hx of difficult intubation and had his last EGD/Flex at [facility].  He did have an OV with Dr. [name] on 04-22-21.  Could you set him up in the hospital please?  Thanks!  Cyril Mourning

## 2022-05-26 NOTE — Telephone Encounter (Signed)
I will forward to Dr [name] to review   Dr [name] please review for hospital case

## 2022-05-31 ENCOUNTER — Other Ambulatory Visit: Payer: Self-pay

## 2022-05-31 DIAGNOSIS — Z85038 Personal history of other malignant neoplasm of large intestine: Secondary | ICD-10-CM

## 2022-05-31 DIAGNOSIS — Z1509 Genetic susceptibility to other malignant neoplasm: Secondary | ICD-10-CM

## 2022-05-31 DIAGNOSIS — D132 Benign neoplasm of duodenum: Secondary | ICD-10-CM

## 2022-05-31 NOTE — Telephone Encounter (Signed)
EGD Flex scheduled for 08/12/22 at [facility] with DJ at 9 am   Left message on machine to call back

## 2022-06-01 NOTE — Telephone Encounter (Signed)
EGD Flex scheduled, pt instructed and medications reviewed.  Patient instructions will be given to the pt at his 6/30 previsit..  Patient to call with any questions or concerns.

## 2022-06-11 ENCOUNTER — Ambulatory Visit (AMBULATORY_SURGERY_CENTER): Payer: Self-pay

## 2022-06-11 ENCOUNTER — Telehealth: Payer: Self-pay

## 2022-06-11 VITALS — Ht 70.0 in | Wt 244.0 lb

## 2022-06-11 DIAGNOSIS — Z1509 Genetic susceptibility to other malignant neoplasm: Secondary | ICD-10-CM

## 2022-06-11 DIAGNOSIS — Z85038 Personal history of other malignant neoplasm of large intestine: Secondary | ICD-10-CM

## 2022-06-11 NOTE — Progress Notes (Signed)
No egg or soy allergy known to patient  No issues known to pt with past sedation with any surgeries or procedures  Patient  had issues or difficulty with intubation in the past  Flex sig at [facility], pt states EGD not needed until next year, note sent to Dr [name] re: if EGD is needed at this time.   No FH of Malignant Hyperthermia Pt is not on diet pills Pt is not on  home 02  Pt is not on blood thinners  Pt denies issues with constipation  No A fib or A flutter     Pt encouraged to call with questions or issues.  If pt has My chart, procedure instructions sent via My Chart  Insurance confirmed with pt at [facility] today

## 2022-06-11 NOTE — Telephone Encounter (Signed)
Pt states he is only due for the flexsig at this time, recall EGD isn't due until next year per pt, I think it is scheduled as the egd/flex sig for [facility]?

## 2022-06-16 NOTE — Telephone Encounter (Signed)
The EGD has been removed and the pt has been advised.  No questions at this time

## 2022-07-07 ENCOUNTER — Telehealth: Payer: Self-pay | Admitting: Gastroenterology

## 2022-07-07 NOTE — Telephone Encounter (Signed)
Inbound call from patient stating he would like to use Magnesium instead of Murelax prior to his upcoming procedure at [facility] on 08/12/22. 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 Please give patient a call back to advise.  Thank you

## 2022-07-07 NOTE — Telephone Encounter (Signed)
Called pt-  LM that we do not use Magnesium Citrate as it has been removed from the shelf-  we do the Miralax and Gatorade and Dulcolax as a substitute-  he can call with any questions

## 2022-07-09 ENCOUNTER — Other Ambulatory Visit: Payer: 59 | Admitting: Gastroenterology

## 2022-07-23 ENCOUNTER — Other Ambulatory Visit: Payer: Self-pay | Admitting: Family Medicine

## 2022-07-23 DIAGNOSIS — I10 Essential (primary) hypertension: Secondary | ICD-10-CM

## 2022-07-26 ENCOUNTER — Telehealth: Payer: Self-pay | Admitting: Gastroenterology

## 2022-07-26 NOTE — Telephone Encounter (Signed)
LMOM that I will be sending new instructions via [facility]

## 2022-07-26 NOTE — Telephone Encounter (Signed)
Inbound call from patient stating he needs new prep instructions for his procedure on 8/31. Please advise.

## 2022-08-04 ENCOUNTER — Encounter: Payer: Self-pay | Admitting: Gastroenterology

## 2022-08-05 ENCOUNTER — Encounter (HOSPITAL_COMMUNITY): Payer: Self-pay | Admitting: Gastroenterology

## 2022-08-05 NOTE — Progress Notes (Signed)
Attempted to obtain medical history via telephone, unable to reach at this time. HIPAA compliant voicemail message left requesting return call to pre surgical testing department. 

## 2022-08-11 ENCOUNTER — Other Ambulatory Visit: Payer: Self-pay | Admitting: Family Medicine

## 2022-08-11 ENCOUNTER — Telehealth: Payer: Self-pay | Admitting: Family Medicine

## 2022-08-11 NOTE — Telephone Encounter (Signed)
  Prescription Request  08/11/2022  Is this a "Controlled Substance" medicine? no  Have you seen your PCP in the last 2 weeks? no  If YES, route message to pool  -  If NO, patient needs to be scheduled for appointment.  What is the name of the medication or equipment? Potassium Chloride SA 20 MEQ tablet  Have you contacted your pharmacy to request a refill? yes   Which pharmacy would you like this sent to? [facility]   Patient notified that their request is being sent to the clinical staff for review and that they should receive a response within 2 business days.

## 2022-08-11 NOTE — Telephone Encounter (Signed)
Pt aware refill sent to [facility] in Rx in basket  FYI: wanted to let PCP know he is going to have his colonoscopy tomorrow

## 2022-08-12 ENCOUNTER — Encounter (HOSPITAL_COMMUNITY)
Admission: RE | Disposition: A | Discharge status: Home / Self Care | Payer: Self-pay | Source: Ambulatory Visit | Attending: Gastroenterology

## 2022-08-12 ENCOUNTER — Other Ambulatory Visit: Payer: Self-pay

## 2022-08-12 ENCOUNTER — Ambulatory Visit (HOSPITAL_COMMUNITY)
Admission: RE | Admit: 2022-08-12 | Discharge: 2022-08-12 | Disposition: A | Discharge status: Home / Self Care | Payer: 59 | Source: Ambulatory Visit | Attending: Gastroenterology | Admitting: Gastroenterology

## 2022-08-12 ENCOUNTER — Ambulatory Visit (HOSPITAL_BASED_OUTPATIENT_CLINIC_OR_DEPARTMENT_OTHER): Payer: 59 | Admitting: Certified Registered"

## 2022-08-12 ENCOUNTER — Encounter (HOSPITAL_COMMUNITY): Payer: Self-pay | Admitting: Gastroenterology

## 2022-08-12 ENCOUNTER — Ambulatory Visit (HOSPITAL_COMMUNITY): Payer: 59 | Admitting: Certified Registered"

## 2022-08-12 DIAGNOSIS — K219 Gastro-esophageal reflux disease without esophagitis: Secondary | ICD-10-CM | POA: Insufficient documentation

## 2022-08-12 DIAGNOSIS — K633 Ulcer of intestine: Secondary | ICD-10-CM | POA: Diagnosis not present

## 2022-08-12 DIAGNOSIS — K641 Second degree hemorrhoids: Secondary | ICD-10-CM | POA: Diagnosis not present

## 2022-08-12 DIAGNOSIS — Z09 Encounter for follow-up examination after completed treatment for conditions other than malignant neoplasm: Secondary | ICD-10-CM | POA: Diagnosis not present

## 2022-08-12 DIAGNOSIS — Z9049 Acquired absence of other specified parts of digestive tract: Secondary | ICD-10-CM | POA: Insufficient documentation

## 2022-08-12 DIAGNOSIS — J449 Chronic obstructive pulmonary disease, unspecified: Secondary | ICD-10-CM

## 2022-08-12 DIAGNOSIS — Z87891 Personal history of nicotine dependence: Secondary | ICD-10-CM | POA: Insufficient documentation

## 2022-08-12 DIAGNOSIS — K621 Rectal polyp: Secondary | ICD-10-CM

## 2022-08-12 DIAGNOSIS — Z980 Intestinal bypass and anastomosis status: Secondary | ICD-10-CM | POA: Diagnosis not present

## 2022-08-12 DIAGNOSIS — Z79899 Other long term (current) drug therapy: Secondary | ICD-10-CM | POA: Insufficient documentation

## 2022-08-12 DIAGNOSIS — Z85038 Personal history of other malignant neoplasm of large intestine: Secondary | ICD-10-CM

## 2022-08-12 DIAGNOSIS — Z1211 Encounter for screening for malignant neoplasm of colon: Secondary | ICD-10-CM | POA: Insufficient documentation

## 2022-08-12 DIAGNOSIS — Z1509 Genetic susceptibility to other malignant neoplasm: Secondary | ICD-10-CM | POA: Diagnosis not present

## 2022-08-12 DIAGNOSIS — I10 Essential (primary) hypertension: Secondary | ICD-10-CM | POA: Diagnosis not present

## 2022-08-12 DIAGNOSIS — D132 Benign neoplasm of duodenum: Secondary | ICD-10-CM

## 2022-08-12 HISTORY — PX: POLYPECTOMY: SHX5525

## 2022-08-12 HISTORY — PX: FLEXIBLE SIGMOIDOSCOPY: SHX5431

## 2022-08-12 HISTORY — PX: BIOPSY: SHX5522

## 2022-08-12 SURGERY — SIGMOIDOSCOPY, FLEXIBLE
Anesthesia: Monitor Anesthesia Care

## 2022-08-12 MED ORDER — PROPOFOL 500 MG/50ML IV EMUL
INTRAVENOUS | Status: DC | PRN
  Administered 2022-08-12: 150 ug/kg/min via INTRAVENOUS

## 2022-08-12 MED ORDER — PROPOFOL 10 MG/ML IV BOLUS
INTRAVENOUS | Status: DC | PRN
  Administered 2022-08-12: 10 mg via INTRAVENOUS
  Administered 2022-08-12: 70 mg via INTRAVENOUS

## 2022-08-12 MED ORDER — SODIUM CHLORIDE 0.9 % IV SOLN: INTRAVENOUS | Status: DC

## 2022-08-12 MED ORDER — GLYCOPYRROLATE 0.2 MG/ML IJ SOLN
INTRAMUSCULAR | Status: DC | PRN
  Administered 2022-08-12: .1 mg via INTRAVENOUS

## 2022-08-12 MED ORDER — LACTATED RINGERS IV SOLN: INTRAVENOUS | Status: DC

## 2022-08-12 MED ORDER — LIDOCAINE 2% (20 MG/ML) 5 ML SYRINGE
INTRAMUSCULAR | Status: DC | PRN
  Administered 2022-08-12: 100 mg via INTRAVENOUS

## 2022-08-12 NOTE — Transfer of Care (Signed)
Immediate Anesthesia Transfer of Care Note  Patient: Charles Santos  Procedure(s) Performed: FLEXIBLE SIGMOIDOSCOPY BIOPSY POLYPECTOMY  Patient Location: PACU  Anesthesia Type:MAC  Level of Consciousness: awake, alert , oriented and patient cooperative  Airway & Oxygen Therapy: Patient Spontanous Breathing and Patient connected to face mask oxygen  Post-op Assessment: Report given to RN and Post -op Vital signs reviewed and stable  Post vital signs: Reviewed and stable  Last Vitals:  Vitals Value Taken Time  BP 118/62 08/12/22 1511  Temp    Pulse 57 08/12/22 1513  Resp 11 08/12/22 1513  SpO2 94 % 08/12/22 1513  Vitals shown include unvalidated device data.  Last Pain:  Vitals:   08/12/22 1511  TempSrc:   PainSc: 0-No pain         Complications: No notable events documented.

## 2022-08-12 NOTE — Anesthesia Preprocedure Evaluation (Signed)
Anesthesia Evaluation  Patient identified by MRN, date of birth, ID band Patient awake    Reviewed: Allergy & Precautions, NPO status , Patient's Chart, lab work & pertinent test results  History of Anesthesia Complications (+) DIFFICULT AIRWAY and history of anesthetic complications  Airway Mallampati: III  TM Distance: >3 FB Neck ROM: Full    Dental no notable dental hx.    Pulmonary COPD, former smoker,    Pulmonary exam normal breath sounds clear to auscultation       Cardiovascular hypertension, Pt. on medications Normal cardiovascular exam Rhythm:Regular Rate:Normal     Neuro/Psych  Headaches, PSYCHIATRIC DISORDERS Anxiety Depression    GI/Hepatic Neg liver ROS, GERD  Medicated and Controlled,Cancer of transverse colon    Endo/Other  negative endocrine ROS  Renal/GU negative Renal ROS  negative genitourinary   Musculoskeletal negative musculoskeletal ROS (+)   Abdominal (+) + obese,   Peds  Hematology HLD   Anesthesia Other Findings lynch syndrome hx of colon cancer hx of duodenal polyps  Reproductive/Obstetrics                             Anesthesia Physical  Anesthesia Plan  ASA: 3  Anesthesia Plan: MAC   Post-op Pain Management:    Induction: Intravenous  PONV Risk Score and Plan: 1 and Propofol infusion, Treatment may vary due to age or medical condition and TIVA  Airway Management Planned: Nasal Cannula, Simple Face Mask and Natural Airway  Additional Equipment: None  Intra-op Plan:   Post-operative Plan:   Informed Consent: I have reviewed the patients History and Physical, chart, labs and discussed the procedure including the risks, benefits and alternatives for the proposed anesthesia with the patient or authorized representative who has indicated his/her understanding and acceptance.       Plan Discussed with: CRNA  Anesthesia Plan Comments:          Anesthesia Quick Evaluation

## 2022-08-12 NOTE — Discharge Instructions (Signed)

## 2022-08-12 NOTE — H&P (Signed)
GASTROENTEROLOGY PROCEDURE H&P NOTE   Primary Care Physician: Claretta Fraise, MD  HPI: Charles Santos is a 51 y.o. male who presents for flexible sigmoidoscopy in setting of [name] and status post prior colectomy patient is up-to-date on upper endoscopy surveillance/screening.  Past Medical History:  Diagnosis Date   Acute midline low back pain with bilateral sciatica 05/16/2020   Anxiety    Cancer of transverse colon ([place]) 09/17/2016   surgery done   Colon cancer [name]) 1998   surgery and chemo   Complication of anesthesia    not fully under for polyp removal at [place] 2017   Depression    Emphysema of lung ([place])    Family hx of colon cancer    GERD (gastroesophageal reflux disease)    none recent   Headache    Hyperlipidemia    Hypertension    Vitamin D deficiency    Past Surgical History:  Procedure Laterality Date   APPENDECTOMY     colon resectomy  1998   2 degree herida   COLONOSCOPY     ESOPHAGOGASTRODUODENOSCOPY (EGD) WITH PROPOFOL N/A 05/19/2017   Procedure: ESOPHAGOGASTRODUODENOSCOPY (EGD) WITH PROPOFOL;  Surgeon: [name], MD;  Location: [facility];  Service: Endoscopy;  Laterality: N/A;   ESOPHAGOGASTRODUODENOSCOPY (EGD) WITH PROPOFOL N/A 06/04/2021   Procedure: ESOPHAGOGASTRODUODENOSCOPY (EGD) WITH PROPOFOL;  Surgeon: [name], MD;  Location: [facility];  Service: Endoscopy;  Laterality: N/A;   FLEXIBLE SIGMOIDOSCOPY N/A 05/19/2017   Procedure: FLEXIBLE SIGMOIDOSCOPY;  Surgeon: [name], MD;  Location: [facility];  Service: Endoscopy;  Laterality: N/A;   FLEXIBLE SIGMOIDOSCOPY N/A 06/04/2021   Procedure: FLEXIBLE SIGMOIDOSCOPY;  Surgeon: [name], MD;  Location: [facility];  Service: Endoscopy;  Laterality: N/A;   INGUINAL HERNIA REPAIR     LAPAROSCOPIC SUBTOTAL COLECTOMY N/A 09/17/2016   Procedure: DIAGNOSTIC LAPAROSCOPY EXPLORATORY LAPAROTOMY SUBTOTAL COLECTOMY PROCTOSCOPY;  Surgeon: [name], MD;   Location: [facility];  Service: General;  Laterality: N/A;   polyp removal at [place]  2017   POLYPECTOMY     PROCTOSCOPY  09/17/2016   Procedure: PROCTOSCOPY;  Surgeon: [name], MD;  Location: [facility];  Service: General;;   SIGMOIDOSCOPY     TRANSFORAMINAL LUMBAR INTERBODY FUSION W/ MIS 1 LEVEL Right 02/02/2021   Procedure: Right Lumbar four -lumbar five minimally invasive transforaminal lumbar interbody fusion;  Surgeon: Judith Part, MD;  Location: [facility];  Service: Neurosurgery;  Laterality: Right;   UPPER GASTROINTESTINAL ENDOSCOPY     UPPER GI ENDOSCOPY     Current Facility-Administered Medications  Medication Dose Route Frequency Provider Last Rate Last Admin   0.9 %  sodium chloride infusion   Intravenous Continuous [name], MD       lactated ringers infusion   Intravenous Continuous Mansouraty, Telford Nab., MD 10 mL/hr at 08/12/22 1355 New Bag at 08/12/22 1355    Current Facility-Administered Medications:    0.9 %  sodium chloride infusion, , Intravenous, Continuous, [name], MD   lactated ringers infusion, , Intravenous, Continuous, Mansouraty, Telford Nab., MD, Last Rate: 10 mL/hr at 08/12/22 1355, New Bag at 08/12/22 1355 Allergies  Allergen Reactions   Oxycodone Other (See Comments)    Made pt feel like a "ZOMBIE"   Adhesive [Tape] Rash and Other (See Comments)    Prefers paper tape   Family History  Problem Relation Age of Onset   Colon cancer Father        pt thinks  he was in his 21's when dx   Esophageal cancer Neg Hx    Rectal cancer Neg Hx    Stomach cancer Neg Hx    Prostate cancer Neg Hx    Colon polyps Neg Hx    Social History   Socioeconomic History   Marital status: Divorced    Spouse name: Not on file   Number of children: Not on file   Years of education: Not on file   Highest education level: Not on file  Occupational History   Not on file  Tobacco Use   Smoking status: Former    Packs/day: 1.00    Years: 23.00     Total pack years: 23.00    Types: Cigarettes    Quit date: 10/07/2020    Years since quitting: 1.8   Smokeless tobacco: Never  Vaping Use   Vaping Use: Never used  Substance and Sexual Activity   Alcohol use: Yes    Alcohol/week: 63.0 standard drinks of alcohol    Types: 63 Cans of beer per week   Drug use: Never   Sexual activity: Not on file  Other Topics Concern   Not on file  Social History Narrative   Not on file   Social Determinants of Health   Financial Resource Strain: Not on file  Food Insecurity: Not on file  Transportation Needs: Not on file  Physical Activity: Not on file  Stress: Not on file  Social Connections: Not on file  Intimate Partner Violence: Not on file    Physical Exam: Today's Vitals   08/12/22 1339  BP: (!) 165/81  Pulse: 66  Resp: 13  Temp: 98.4 F (36.9 C)  TempSrc: Temporal  SpO2: 99%  Weight: 108.9 kg  Height: '5\' 10"'$  (1.778 m)  PainSc: 0-No pain   Body mass index is 34.44 kg/m. GEN: NAD EYE: Sclerae anicteric ENT: MMM CV: Non-tachycardic GI: Soft, NT/ND NEURO:  Alert & Oriented x 3  Lab Results: No results for input(s): "WBC", "HGB", "HCT", "PLT" in the last 72 hours. BMET No results for input(s): "NA", "K", "CL", "CO2", "GLUCOSE", "BUN", "CREATININE", "CALCIUM" in the last 72 hours. LFT No results for input(s): "PROT", "ALBUMIN", "AST", "ALT", "ALKPHOS", "BILITOT", "BILIDIR", "IBILI" in the last 72 hours. PT/INR No results for input(s): "LABPROT", "INR" in the last 72 hours.   Impression / Plan: This is a 51 y.o.male who presents for flexible sigmoidoscopy in setting of [name] and status post prior colectomy patient is up-to-date on upper endoscopy surveillance/screening.  The risks and benefits of endoscopic evaluation/treatment were discussed with the patient and/or family; these include but are not limited to the risk of perforation, infection, bleeding, missed lesions, lack of diagnosis, severe illness  requiring hospitalization, as well as anesthesia and sedation related illnesses.  The patient's history has been reviewed, patient examined, no change in status, and deemed stable for procedure.  The patient and/or family is agreeable to proceed.    Justice Britain, MD [facility] Advanced Endoscopy Office # [phone]

## 2022-08-12 NOTE — Op Note (Signed)
[facility] Patient Name: Charles Santos Procedure Date: 08/12/2022 MRN: [id number] Attending MD: Justice Britain , MD Date of Birth: 12/19/1970 CSN: [id number] Age: 51 Admit Type: Outpatient Procedure:                Flexible Sigmoidoscopy Indications:              High risk colon cancer surveillance: Personal                            history of colon cancer, High risk colon cancer                            surveillance: Personal history of hereditary                            nonpolyposis colorectal cancer ([name]) Providers:                Justice Britain, MD, Burtis Junes, RN, Hinton Dyer                            Technician, Technician Referring MD:             Claretta Fraise, [name], MD Medicines:                Monitored Anesthesia Care Complications:            No immediate complications. Estimated Blood Loss:     Estimated blood loss was minimal. Procedure:                Pre-Anesthesia Assessment:                           - Prior to the procedure, a History and Physical                            was performed, and patient medications and                            allergies were reviewed. The patient's tolerance of                            previous anesthesia was also reviewed. The risks                            and benefits of the procedure and the sedation                            options and risks were discussed with the patient.                            All questions were answered, and informed consent                            was obtained. Prior Anticoagulants: The patient has  taken no previous anticoagulant or antiplatelet                            agents. ASA Grade Assessment: III - A patient with                            severe systemic disease. After reviewing the risks                            and benefits, the patient was deemed in                            satisfactory condition to  undergo the procedure.                           After obtaining informed consent, the scope was                            passed under direct vision. The [id number]-[id number] ([id number])                            Olympus endoscope was introduced through the anus                            and advanced to the the ileo-sigmoid anastomosis.                            The flexible sigmoidoscopy was accomplished without                            difficulty. The patient tolerated the procedure.                            The quality of the bowel preparation was adequate. Scope In: 2:52:58 PM Scope Out: 3:05:21 PM Total Procedure Duration: 0 hours 12 minutes 23 seconds  Findings:      The digital rectal exam findings include hemorrhoids. Pertinent       negatives include no palpable rectal lesions.      There was evidence of a prior functional end-to-end ileo-colonic       anastomosis in the recto-sigmoid colon (22 cm from anal os). The       anastomosi was traversed. This was patent and was characterized by       congestion and erosions. Biopsies were taken with a cold forceps for       histology.      The visualized neo-terminal ileum appeared normal.      A 3 mm polyp was found in the rectum. The polyp was sessile. The polyp       was removed with a cold snare. Resection and retrieval were complete.      Normal mucosa was found in the entire colon/rectum that was visualized       otherwise.      Non-bleeding non-thrombosed internal hemorrhoids were found during       retroflexion, during perianal exam and during digital exam. The       hemorrhoids  were Grade II (internal hemorrhoids that prolapse but reduce       spontaneously). Impression:               - Hemorrhoids found on digital rectal exam.                           - Patent functional end-to-end ileo-colonic                            anastomosis, characterized at anastomosis by                            congestion and erosion which was  biopsied.                           - The neo-terminal ileum is normal.                           - One 3 mm polyp in the rectum, removed with a cold                            snare. Resected and retrieved.                           - Normal mucosa in the entire examined colon                            otherwise.                           - Non-bleeding non-thrombosed internal hemorrhoids. Moderate Sedation:      Not Applicable - Patient had care per Anesthesia. Recommendation:           - The patient will be observed post-procedure,                            until all discharge criteria are met.                           - Discharge patient to home.                           - Patient has a contact number available for                            emergencies. The signs and symptoms of potential                            delayed complications were discussed with the                            patient. Return to normal activities tomorrow.                            Written discharge instructions were provided to the  patient.                           - Resume previous diet.                           - Await pathology results.                           - Repeat flexible sigmoidoscopy in 1 year for                            surveillance. EGD should be completed at that time                            as well.                           - The findings and recommendations were discussed                            with the patient.                           - The findings and recommendations were discussed                            with the designated responsible adult. Procedure Code(s):        --- Professional ---                           [id number], Sigmoidoscopy, flexible; with removal of                            tumor(s), polyp(s), or other lesion(s) by snare                            technique                           45331, 39, Sigmoidoscopy, flexible;  with biopsy,                            single or multiple Diagnosis Code(s):        --- Professional ---                           I34.373, Personal history of other malignant                            neoplasm of large intestine                           Z15.09, Genetic susceptibility to other malignant                            neoplasm  K64.1, Second degree hemorrhoids                           Z98.0, Intestinal bypass and anastomosis status                           K62.1, Rectal polyp CPT copyright 2019 [employer]. All rights reserved. The codes documented in this report are preliminary and upon coder review may  be revised to meet current compliance requirements. Justice Britain, MD 08/12/2022 3:22:01 PM Number of Addenda: 0

## 2022-08-12 NOTE — Anesthesia Postprocedure Evaluation (Signed)
Anesthesia Post Note  Patient: Charles Santos  Procedure(s) Performed: [place] POLYPECTOMY     Patient location during evaluation: Endoscopy Anesthesia Type: MAC Level of consciousness: awake and alert Pain management: pain level controlled Vital Signs Assessment: post-procedure vital signs reviewed and stable Respiratory status: spontaneous breathing, nonlabored ventilation, respiratory function stable and patient connected to nasal cannula oxygen Cardiovascular status: blood pressure returned to baseline and stable Postop Assessment: no apparent nausea or vomiting Anesthetic complications: no   No notable events documented.  Last Vitals:  Vitals:   08/12/22 1520 08/12/22 1530  BP:  (!) 141/90  Pulse: 77 (!) 56  Resp: 17 15  Temp:    SpO2: 97% 97%    Last Pain:  Vitals:   08/12/22 1530  TempSrc:   PainSc: 0-No pain                 Kaleiah Kutzer L Nadalie Laughner

## 2022-08-13 ENCOUNTER — Encounter: Payer: Self-pay | Admitting: Gastroenterology

## 2022-08-13 LAB — SURGICAL PATHOLOGY

## 2022-08-15 ENCOUNTER — Encounter (HOSPITAL_COMMUNITY): Payer: Self-pay | Admitting: Gastroenterology

## 2022-08-20 ENCOUNTER — Other Ambulatory Visit: Payer: Self-pay | Admitting: Family Medicine

## 2022-09-02 ENCOUNTER — Encounter: Payer: 59 | Admitting: Family Medicine

## 2022-09-22 ENCOUNTER — Other Ambulatory Visit: Payer: Self-pay | Admitting: Family Medicine

## 2022-09-22 ENCOUNTER — Encounter: Payer: Self-pay | Admitting: Family Medicine

## 2022-09-22 ENCOUNTER — Ambulatory Visit (INDEPENDENT_AMBULATORY_CARE_PROVIDER_SITE_OTHER): Payer: 59 | Admitting: Family Medicine

## 2022-09-22 VITALS — BP 132/63 | HR 84 | Temp 97.9°F | Ht 70.0 in | Wt 248.4 lb

## 2022-09-22 DIAGNOSIS — M5417 Radiculopathy, lumbosacral region: Secondary | ICD-10-CM

## 2022-09-22 DIAGNOSIS — Z0001 Encounter for general adult medical examination with abnormal findings: Secondary | ICD-10-CM

## 2022-09-22 DIAGNOSIS — I10 Essential (primary) hypertension: Secondary | ICD-10-CM

## 2022-09-22 DIAGNOSIS — Z79899 Other long term (current) drug therapy: Secondary | ICD-10-CM | POA: Diagnosis not present

## 2022-09-22 DIAGNOSIS — E782 Mixed hyperlipidemia: Secondary | ICD-10-CM

## 2022-09-22 DIAGNOSIS — K4090 Unilateral inguinal hernia, without obstruction or gangrene, not specified as recurrent: Secondary | ICD-10-CM

## 2022-09-22 DIAGNOSIS — Z125 Encounter for screening for malignant neoplasm of prostate: Secondary | ICD-10-CM

## 2022-09-22 DIAGNOSIS — Z0000 Encounter for general adult medical examination without abnormal findings: Secondary | ICD-10-CM

## 2022-09-22 DIAGNOSIS — Z1509 Genetic susceptibility to other malignant neoplasm: Secondary | ICD-10-CM

## 2022-09-22 LAB — URINALYSIS
Bilirubin, UA: NEGATIVE
Ketones, UA: NEGATIVE
Leukocytes,UA: NEGATIVE
Nitrite, UA: NEGATIVE
Protein,UA: NEGATIVE
RBC, UA: NEGATIVE
Specific Gravity, UA: 1.02 (ref 1.005–1.030)
Urobilinogen, Ur: 0.2 mg/dL (ref 0.2–1.0)
pH, UA: 5.5 (ref 5.0–7.5)

## 2022-09-22 MED ORDER — PREGABALIN 200 MG PO CAPS
200.0000 mg | ORAL_CAPSULE | Freq: Every day | ORAL | 1 refills | Status: DC
Start: 2022-09-22 — End: 2023-03-22

## 2022-09-22 MED ORDER — NAPROXEN 500 MG PO TABS
500.0000 mg | ORAL_TABLET | Freq: Two times a day (BID) | ORAL | 3 refills | Status: DC
Start: 2022-09-22 — End: 2023-10-03

## 2022-09-22 MED ORDER — PANTOPRAZOLE SODIUM 40 MG PO TBEC: 40.0000 mg | DELAYED_RELEASE_TABLET | Freq: Every day | ORAL | 3 refills | Status: DC

## 2022-09-22 MED ORDER — QUETIAPINE FUMARATE 100 MG PO TABS: 100.0000 mg | ORAL_TABLET | Freq: Every day | ORAL | 3 refills | Status: DC

## 2022-09-22 MED ORDER — CHLORTHALIDONE 25 MG PO TABS: 25.0000 mg | ORAL_TABLET | Freq: Every day | ORAL | 3 refills | Status: DC

## 2022-09-22 NOTE — Progress Notes (Signed)
Subjective:  Patient ID: Charles Santos, male    DOB: [birth year]  Age: 51 y.o. MRN: [id number]  CC: Annual Exam   HPI Charles Santos presents for annual exam.  Updating CSA for pain. Lumbar radicular pain recently exacerbated by lifting. Radiating to the RLE. Seeing neurosurgery on Nov. 2.      09/22/2022    2:56 PM 03/02/2022    3:27 PM 12/22/2021    2:45 PM  Depression screen PHQ 2/9  Decreased Interest 0 0 0  Down, Depressed, Hopeless 0 0 0  PHQ - 2 Score 0 0 0    History Charles Santos has a past medical history of Acute midline low back pain with bilateral sciatica (05/16/2020), Anxiety, Cancer of transverse colon ([place]) (09/17/2016), Colon cancer ([place]) (8416), Complication of anesthesia, Depression, Emphysema of lung ([place]), Family hx of colon cancer, GERD (gastroesophageal reflux disease), Headache, Hyperlipidemia, Hypertension, and Vitamin D deficiency.   He has a past surgical history that includes colon resectomy (1998); Appendectomy; Laparoscopic subtotal colectomy (N/A, 09/17/2016); Proctoscopy (09/17/2016); Inguinal hernia repair; Upper gi endoscopy; polyp removal at [facility] (2017); Flexible sigmoidoscopy (N/A, 05/19/2017); Esophagogastroduodenoscopy (egd) with propofol (N/A, 05/19/2017); Colonoscopy; Transforaminal lumbar interbody fusion w/ mis 1 level (Right, 02/02/2021); Polypectomy; Sigmoidoscopy; Upper gastrointestinal endoscopy; Flexible sigmoidoscopy (N/A, 06/04/2021); Esophagogastroduodenoscopy (egd) with propofol (N/A, 06/04/2021); Flexible sigmoidoscopy (N/A, 08/12/2022); biopsy (08/12/2022); and polypectomy (08/12/2022).   His family history includes Colon cancer in his father.He reports that he quit smoking about 1 years ago. His smoking use included cigarettes. He has a 23.00 pack-year smoking history. He has never used smokeless tobacco. He reports current alcohol use of about 63.0 standard drinks of alcohol per week. He reports that he does not use drugs.    ROS Review of Systems   Constitutional:  Negative for activity change, fatigue and unexpected weight change.  HENT:  Negative for congestion, ear pain, hearing loss, postnasal drip and trouble swallowing.   Eyes:  Negative for pain and visual disturbance.  Respiratory:  Negative for cough, chest tightness and shortness of breath.   Cardiovascular:  Negative for chest pain, palpitations and leg swelling.  Gastrointestinal:  Negative for abdominal distention, abdominal pain, blood in stool, constipation, diarrhea, nausea and vomiting.  Endocrine: Negative for cold intolerance, heat intolerance and polydipsia.  Genitourinary:  Negative for difficulty urinating, dysuria, flank pain, frequency and urgency.  Musculoskeletal:  Negative for arthralgias and joint swelling.  Skin:  Negative for color change, rash and wound.  Neurological:  Negative for dizziness, syncope, speech difficulty, weakness, light-headedness, numbness and headaches.  Hematological:  Does not bruise/bleed easily.  Psychiatric/Behavioral:  Negative for confusion, decreased concentration, dysphoric mood and sleep disturbance. The patient is not nervous/anxious.     Objective:  BP 132/63   Pulse 84   Temp 97.9 F (36.6 C)   Ht 5' 10"  (1.778 m)   Wt 248 lb 6.4 oz (112.7 kg)   SpO2 96%   BMI 35.64 kg/m   BP Readings from Last 3 Encounters:  09/22/22 132/63  08/12/22 (!) 141/90  03/02/22 128/61    Wt Readings from Last 3 Encounters:  09/22/22 248 lb 6.4 oz (112.7 kg)  08/12/22 240 lb (108.9 kg)  06/11/22 244 lb (110.7 kg)     Physical Exam Constitutional:      Appearance: He is well-developed.  HENT:     Head: Normocephalic and atraumatic.  Eyes:     Pupils: Pupils are equal, round, and reactive to light.  Neck:  Thyroid: No thyromegaly.     Trachea: No tracheal deviation.  Cardiovascular:     Rate and Rhythm: Normal rate and regular rhythm.     Heart sounds: Normal heart sounds. No murmur heard.    No friction rub. No  gallop.  Pulmonary:     Breath sounds: Normal breath sounds. No wheezing or rales.  Abdominal:     General: Bowel sounds are normal. There is no distension.     Palpations: Abdomen is soft. There is no mass.     Tenderness: There is no abdominal tenderness.     Hernia: A hernia is present. Hernia is present in the left inguinal area.  Genitourinary:    Penis: Normal.      Testes: Normal.        Right: Mass, testicular hydrocele or varicocele not present.        Left: Mass, testicular hydrocele or varicocele not present.  Musculoskeletal:        General: Normal range of motion.     Cervical back: Normal range of motion.  Lymphadenopathy:     Cervical: No cervical adenopathy.  Skin:    General: Skin is warm and dry.  Neurological:     Mental Status: He is alert and oriented to person, place, and time.  Psychiatric:        Mood and Affect: Mood normal.        Behavior: Behavior normal.       Assessment & Plan:   Charles Santos was seen today for annual exam.  Diagnoses and all orders for this visit:  Well adult exam -     CBC with Differential/Platelet -     CMP14+EGFR -     Lipid panel -     Urinalysis  Essential hypertension -     CBC with Differential/Platelet -     CMP14+EGFR -     chlorthalidone (HYGROTON) 25 MG tablet; Take 1 tablet (25 mg total) by mouth daily.  Mixed hyperlipidemia -     Lipid panel  Controlled substance agreement signed -     ToxASSURE Select 13 (MW), Urine  Radiculopathy of lumbosacral region  [name]  Left inguinal hernia -     Ambulatory referral to General Surgery  Screening for prostate cancer -     PSA, total and free  Other orders -     pantoprazole (PROTONIX) 40 MG tablet; Take 1 tablet (40 mg total) by mouth daily. -     pregabalin (LYRICA) 200 MG capsule; Take 1 capsule (200 mg total) by mouth at bedtime. For pain relief -     QUEtiapine (SEROQUEL) 100 MG tablet; Take 1 tablet (100 mg total) by mouth at bedtime. -      naproxen (NAPROSYN) 500 MG tablet; Take 1 tablet (500 mg total) by mouth 2 (two) times daily. (NEEDS TO BE SEEN BEFORE NEXT REFILL)       I have discontinued [name] cefPROZIL. I have also changed his chlorthalidone, pantoprazole, and QUEtiapine. Additionally, I am having him maintain his multivitamin with minerals, Fish Oil, acetaminophen, fenofibrate, potassium chloride SA, pregabalin, and naproxen.  Allergies as of 09/22/2022       Reactions   Oxycodone Other (See Comments)   Made pt feel like a "ZOMBIE"   Adhesive [tape] Rash, Other (See Comments)   Prefers paper tape        Medication List        Accurate as of September 22, 2022  3:48 PM.  If you have any questions, ask your nurse or doctor.          STOP taking these medications    cefPROZIL 500 MG tablet Commonly known as: CEFZIL Stopped by: Claretta Fraise, MD       TAKE these medications    acetaminophen 500 MG tablet Commonly known as: TYLENOL Take 500-1,000 mg by mouth every 6 (six) hours as needed (pain.).   chlorthalidone 25 MG tablet Commonly known as: HYGROTON Take 1 tablet (25 mg total) by mouth daily.   fenofibrate 160 MG tablet Take 1 tablet (160 mg total) by mouth daily. For cholesterol and triglyceride   Fish Oil 1200 MG Caps Take 1,200 mg by mouth in the morning.   multivitamin with minerals Tabs tablet Take 1 tablet by mouth daily. What changed: when to take this   naproxen 500 MG tablet Commonly known as: NAPROSYN Take 1 tablet (500 mg total) by mouth 2 (two) times daily. (NEEDS TO BE SEEN BEFORE NEXT REFILL)   pantoprazole 40 MG tablet Commonly known as: PROTONIX Take 1 tablet (40 mg total) by mouth daily.   potassium chloride SA 20 MEQ tablet Commonly known as: KLOR-CON M TAKE ONE TABLET THREE TIMES DAILY   pregabalin 200 MG capsule Commonly known as: Lyrica Take 1 capsule (200 mg total) by mouth at bedtime. For pain relief   QUEtiapine 100 MG tablet Commonly  known as: SEROQUEL Take 1 tablet (100 mg total) by mouth at bedtime. What changed:  when to take this reasons to take this       We discussed weight loss strategies.  Follow-up: Return in about 6 months (around 03/24/2023), or if symptoms worsen or fail to improve.  Claretta Fraise, M.D.

## 2022-09-23 ENCOUNTER — Other Ambulatory Visit: Payer: 59

## 2022-09-24 LAB — CBC WITH DIFFERENTIAL/PLATELET
Basophils Absolute: 0.1 10*3/uL (ref 0.0–0.2)
Basos: 1 %
EOS (ABSOLUTE): 0.1 10*3/uL (ref 0.0–0.4)
Eos: 3 %
Hematocrit: 44.6 % (ref 37.5–51.0)
Hemoglobin: 15.2 g/dL (ref 13.0–17.7)
Immature Grans (Abs): 0 10*3/uL (ref 0.0–0.1)
Immature Granulocytes: 0 %
Lymphocytes Absolute: 1.2 10*3/uL (ref 0.7–3.1)
Lymphs: 22 %
MCH: 32.3 pg (ref 26.6–33.0)
MCHC: 34.1 g/dL (ref 31.5–35.7)
MCV: 95 fL (ref 79–97)
Monocytes Absolute: 0.5 10*3/uL (ref 0.1–0.9)
Monocytes: 9 %
Neutrophils Absolute: 3.3 10*3/uL (ref 1.4–7.0)
Neutrophils: 65 %
Platelets: 199 10*3/uL (ref 150–450)
RBC: 4.7 x10E6/uL (ref 4.14–5.80)
RDW: 13.4 % (ref 11.6–15.4)
WBC: 5.2 10*3/uL (ref 3.4–10.8)

## 2022-09-24 LAB — CMP14+EGFR
ALT: 47 IU/L — ABNORMAL HIGH (ref 0–44)
AST: 50 IU/L — ABNORMAL HIGH (ref 0–40)
Albumin/Globulin Ratio: 2 (ref 1.2–2.2)
Albumin: 4.3 g/dL (ref 4.1–5.1)
Alkaline Phosphatase: 66 IU/L (ref 44–121)
BUN/Creatinine Ratio: 19 (ref 9–20)
BUN: 19 mg/dL (ref 6–24)
Bilirubin Total: 0.5 mg/dL (ref 0.0–1.2)
CO2: 23 mmol/L (ref 20–29)
Calcium: 9.2 mg/dL (ref 8.7–10.2)
Chloride: 98 mmol/L (ref 96–106)
Creatinine, Ser: 0.98 mg/dL (ref 0.76–1.27)
Globulin, Total: 2.2 g/dL (ref 1.5–4.5)
Glucose: 107 mg/dL — ABNORMAL HIGH (ref 70–99)
Potassium: 3.7 mmol/L (ref 3.5–5.2)
Sodium: 138 mmol/L (ref 134–144)
Total Protein: 6.5 g/dL (ref 6.0–8.5)
eGFR: 94 mL/min/{1.73_m2} (ref >59)

## 2022-09-24 LAB — PSA, TOTAL AND FREE
PSA, Free Pct: 40 %
PSA, Free: 0.4 ng/mL
Prostate Specific Ag, Serum: 1 ng/mL (ref 0.0–4.0)

## 2022-09-24 LAB — LIPID PANEL
Chol/HDL Ratio: 2.7 ratio (ref 0.0–5.0)
Cholesterol, Total: 136 mg/dL (ref 100–199)
HDL: 50 mg/dL (ref >39)
LDL Chol Calc (NIH): 44 mg/dL (ref 0–99)
Triglycerides: 277 mg/dL — ABNORMAL HIGH (ref 0–149)
VLDL Cholesterol Cal: 42 mg/dL — ABNORMAL HIGH (ref 5–40)

## 2022-09-25 NOTE — Progress Notes (Signed)
Hello Emanuele,  Your lab result is normal and/or stable.Some minor variations that are not significant are commonly marked abnormal, but do not represent any medical problem for you.  Best regards, Draiden Mirsky, M.D.

## 2022-09-27 LAB — TOXASSURE SELECT 13 (MW), URINE

## 2022-10-07 ENCOUNTER — Other Ambulatory Visit: Payer: Self-pay | Admitting: Family Medicine

## 2022-10-20 ENCOUNTER — Other Ambulatory Visit: Payer: Self-pay | Admitting: Family Medicine

## 2022-10-20 ENCOUNTER — Telehealth: Payer: Self-pay | Admitting: Family Medicine

## 2022-10-20 NOTE — Telephone Encounter (Signed)
Use fenofibrate instead.

## 2022-10-20 NOTE — Telephone Encounter (Signed)
CALLED PATIENT, NO ANSWER, LEFT MESSAGE TO RETURN CALL 

## 2022-10-21 NOTE — Telephone Encounter (Signed)
Lmtcb.

## 2022-10-21 NOTE — Telephone Encounter (Signed)
Can you please call him and let him know? Thanks!

## 2022-10-25 NOTE — Telephone Encounter (Signed)
CALLED PATIENT, NO ANSWER, LEFT MESSAGE TO RETURN CALL 

## 2022-10-26 NOTE — Telephone Encounter (Signed)
Patient came in office to ask about this medication. Please call back.

## 2022-10-26 NOTE — Telephone Encounter (Signed)
Lmtcb.

## 2022-10-27 NOTE — Telephone Encounter (Signed)
CALLED PATIENT, NO ANSWER, LEFT MESSAGE TO RETURN CALL 

## 2022-10-27 NOTE — Telephone Encounter (Signed)
LMTCB JBB 11/15

## 2022-10-28 NOTE — Telephone Encounter (Signed)
Called patient, no answer, left message to return call 

## 2022-10-28 NOTE — Telephone Encounter (Signed)
CALLED PATIENT, NO ANSWER, LEFT MESSAGE TO RETURN CALL 

## 2022-11-08 NOTE — Telephone Encounter (Signed)
Spoke with patient regarding this and reviewed Dr Livia Snellen note with patient. Patient voiced understanding.

## 2022-11-18 ENCOUNTER — Ambulatory Visit: Payer: Self-pay | Admitting: Surgery

## 2022-11-18 NOTE — H&P (Signed)
Jeanmarie Plant [id number]   Referring Provider:  Gabriel Carina, MD   Subjective   Chief Complaint: New Consultation and Inguinal Hernia     History of Present Illness: 51 year old male with history of tobacco abuse-quit 2 years ago (23-pack-year smoking history) alcohol abuse (states he drinks more than usual, usually help himself fall asleep at night), chronic back pain/radiculopathy with prior lumbar surgeries, pain contract, obesity, [name], Surgical history includes laparoscopic converted to open subtotal colectomy with 60 minutes of adhesiolysis by Dr. Dalbert Batman in 2017 for [name], previous RIGHT inguinal hernia repair He has developed more left groin pain, usually at the end of the day after standing or lifting.  He works at a [employer] and occasionally has to do fairly strenuous things for this.  Has not felt a bulge or mass in the left groin per se. He had a CT scan in February 2022 which I have reviewed personally; this does show bilateral fat-containing inguinal hernias as well as some bladder herniation on the left side.  Review of Systems: A complete review of systems was obtained from the patient.  I have reviewed this information and discussed as appropriate with the patient.  See HPI as well for other ROS.   Medical History: Past Medical History:  Diagnosis Date   Anxiety    GERD (gastroesophageal reflux disease)    Hyperlipidemia    Hypertension     There is no problem list on file for this patient.   Past Surgical History:  Procedure Laterality Date   APPENDECTOMY     colon resectomy     COLON SURGERY     HERNIA REPAIR     Laparoscopic subtotal colectomy     Polypectomy     Transforaminal lumbar interbody fusion w/ mis 1 level (Right)       Allergies  Allergen Reactions   Oxycodone Other (See Comments)    Made pt feel like a "ZOMBIE"  Other reaction(s): Other (See Comments)  unspecified   Adhesive Tape-Silicones  Rash    Other reaction(s): Other (See Comments)  Prefers paper tape    Current Outpatient Medications on File Prior to Visit  Medication Sig Dispense Refill   chlorthalidone 25 MG tablet Take 25 mg by mouth once daily     fenofibrate 160 MG tablet Take 1 tablet by mouth once daily     naproxen (NAPROSYN) 500 MG tablet Take by mouth every 12 (twelve) hours     omega-3 fatty acids-fish oil 360-1,200 mg Cap Take by mouth     pantoprazole (PROTONIX) 40 MG DR tablet Take 40 mg by mouth once daily     potassium chloride (KLOR-CON) 20 MEQ ER tablet Take 20 mEq by mouth 3 (three) times daily     pregabalin (LYRICA) 200 MG capsule Take by mouth     QUEtiapine (SEROQUEL) 100 MG tablet Take 100 mg by mouth at bedtime     No current facility-administered medications on file prior to visit.    Family History  Problem Relation Age of Onset   Colon cancer Father      Social History   Tobacco Use  Smoking Status Former   Types: Cigarettes   Quit date: 2021   Years since quitting: 2.9  Smokeless Tobacco Never     Social History   Socioeconomic History   Marital status: Single  Tobacco Use   Smoking status: Former    Types: Cigarettes    Quit date: 2021  Years since quitting: 2.9   Smokeless tobacco: Never  Substance and Sexual Activity   Alcohol use: Yes   Drug use: Never    Objective:    Vitals:   11/18/22 1021  BP: (!) 140/60  Pulse: 91  Temp: 36.7 C (98 F)  SpO2: 96%  Weight: (!) 114.8 kg (253 lb)  Height: 177.8 cm ('5\' 10"'$ )    Body mass index is 36.3 kg/m.  Gen: A&Ox3, no distress  Unlabored respirations Abdomen is obese, soft, nontender.  There is protrusion on the left side with vasalva.  No significant protrusion on the right side.  Assessment and Plan:  Diagnoses and all orders for this visit:  Non-recurrent unilateral inguinal hernia without obstruction or gangrene    By CT there appear to be a small recurrence on the right as well as a left direct  inguinal hernia containing bladder and fat.  The right side is asymptomatic at this point and not appreciable on exam.  The left side is symptomatic.  We discussed options for hernia surgery.  Given his extensive previous abdominal surgery I recommend an open repair of his left inguinal hernia. We discussed the relevant anatomy and we discussed the technique of the procedure.  Discussed risks of bleeding, infection, pain, scarring, injury to structures in the area including nerves, blood vessels, bowel, bladder, risk of chronic pain, hernia recurrence, risk of seroma or hematoma, urinary retention, and risks of general anesthesia including cardiovascular, pulmonary, and thromboembolic complications.  His risk of hernia recurrence is increased by obesity and we discussed weight loss which will help with his recovery.  He states that he drinks [employer] and that is putting the weight on.  He is planning to back down on this.  We discussed postoperative activity restrictions-no heavy lifting more than 20 pounds or strenuous pushing/pulling etc. for 6 weeks postop.  Questions were answered.  Patient wishes to proceed with scheduling.   Freja Faro Raquel James, MD

## 2022-12-07 ENCOUNTER — Other Ambulatory Visit: Payer: Self-pay | Admitting: Family Medicine

## 2022-12-19 NOTE — Progress Notes (Addendum)
COVID Vaccine received:  _0  No _1  Yes Date of any COVID positive Test in last 90 days:  PCP - Claretta Fraise, MD Cardiologist - Phineas Inches, MD  Chest x-ray - 12-22-2020   2v Epic EKG -  01-27-2022   Epic Stress Test -  ECHO -  Cardiac Cath -   PCR screen: _2  Ordered & Completed                      _3   No Order but Needs PROFEND                      _4   N/A for this surgery  Surgery Plan:  _5  Ambulatory                            _6  Outpatient in bed                            _7  Admit  Anesthesia:    _8  General  _9  Spinal                           _10   Choice _11   MAC  Bowel Prep - _12  No  _13   Yes _____________  Pacemaker / ICD device _14  No _15  Yes        Device order form faxed _16  No    _17   Yes      Faxed to:  Spinal Cord Stimulator:_18  No _19  Yes      (Remind patient to bring remote DOS) Other Implants:   History of Sleep Apnea? _20  No _21  Yes   CPAP used?- _22  No _23  Yes    Does the patient monitor blood sugar? _24  No _25  Yes  _26  N/A Does patient have a [employer] or Dexacom? _27  No _28  Yes   Fasting Blood Sugar Ranges-  Checks Blood Sugar _____ times a day  Blood Thinner / Instructions:  none Aspirin Instructions:none  ERAS Protocol Ordered: _29  No  _30  Yes PRE-SURGERY _31  ENSURE  _32  G2   Patient is to be NPO after: Midnight Prior  Comments: Hx sigmoid resection for [name] (inherited Colon Ca)  Activity level: Patient can / can not climb a flight of stairs without difficulty; _33  No CP  _34  No SOB, but would have ______   Patient can / can not perform ADLs without assistance.   Anesthesia review: HTN, Anxiety,   "Difficult Intubation" , GI noted pt had a difficult intubation 2018 and again 02-02-2021- this was noted in note to MD 12-22-2020- Dr [name] and Jenny Reichmann state that pt needs [facility] case- in that 12-22-2020 note   Patient denies shortness of breath, fever, cough and chest pain at PAT appointment.  Patient verbalized understanding and agreement  to the Pre-Surgical Instructions that were given to them at this PAT appointment. Patient was also educated of the need to review these PAT instructions again prior to his/her surgery.I reviewed the appropriate phone numbers to call if they have any and questions or concerns.

## 2022-12-19 NOTE — Patient Instructions (Signed)
SURGICAL WAITING ROOM VISITATION Patients having surgery or a procedure may have no more than 2 support people in the waiting area - these visitors may rotate in the visitor waiting room.   Due to an increase in RSV and influenza rates and associated hospitalizations, children ages 27 and under may not visit patients in [place]. If the patient needs to stay at the hospital during part of their recovery, the visitor guidelines for inpatient rooms apply.  PRE-OP VISITATION  Pre-op nurse will coordinate an appropriate time for 1 support person to accompany the patient in pre-op.  This support person may not rotate.  This visitor will be contacted when the time is appropriate for the visitor to come back in the pre-op area.  Please refer to the [facility] website for the visitor guidelines for Inpatients (after your surgery is over and you are in a regular room).  You are not required to quarantine at this time prior to your surgery. However, you must do this: Hand Hygiene often Do NOT share personal items Notify your provider if you are in close contact with someone who has COVID or you develop fever 100.4 or greater, new onset of sneezing, cough, sore throat, shortness of breath or body aches.  If you test positive for Covid or have been in contact with anyone that has tested positive in the last 10 days please notify you surgeon.    Your procedure is scheduled on:  Friday   January 07, 2023  Report to [facility] Main Entrance: [place] entrance where the [employer] is available.   Report to admitting at: 07:15 AM  +++++Call this number if you have any questions or problems the morning of surgery [phone]  DO NOT EAT OR DRINK ANYTHING AFTER MIDNIGHT THE NIGHT PRIOR TO YOUR SURGERY / PROCEDURE.   FOLLOW BOWEL PREP AND ANY ADDITIONAL PRE OP INSTRUCTIONS YOU RECEIVED FROM YOUR SURGEON'S OFFICE!!!   Oral Hygiene is also important to reduce your risk of  infection.        Remember - BRUSH YOUR TEETH THE MORNING OF SURGERY WITH YOUR REGULAR TOOTHPASTE  Do NOT smoke after Midnight the night before surgery.  Take ONLY these medicines the morning of surgery with A SIP OF WATER: none,   You may take Tylenol if needed.                    You may not have any metal on your body including  jewelry, and body piercing  Do not wear  lotions, powders, cologne, or deodorant  Men may shave face and neck.  Patients discharged on the day of surgery will not be allowed to drive home.  Someone NEEDS to stay with you for the first 24 hours after anesthesia.  Do not bring your home medications to the hospital. The Pharmacy will dispense medications listed on your medication list to you during your admission in the Hospital.  Special Instructions: Bring a copy of your healthcare power of attorney and living will documents the day of surgery, if you wish to have them scanned into your [place] Medical Records- EPIC  Please read over the following fact sheets you were given: IF YOU HAVE QUESTIONS ABOUT YOUR PRE-OP INSTRUCTIONS, PLEASE CALL [phone]  ([place])   [facility] - Preparing for Surgery Before surgery, you can play an important role.  Because skin is not sterile, your skin needs to be as free of germs as possible.  You can  reduce the number of germs on your skin by washing with CHG (chlorahexidine gluconate) soap before surgery.  CHG is an antiseptic cleaner which kills germs and bonds with the skin to continue killing germs even after washing. Please DO NOT use if you have an allergy to CHG or antibacterial soaps.  If your skin becomes reddened/irritated stop using the CHG and inform your nurse when you arrive at Short Stay. Do not shave (including legs and underarms) for at least 48 hours prior to the first CHG shower.  You may shave your face/neck.  Please follow these instructions carefully:  1.  Shower with CHG Soap the night before surgery  and the  morning of surgery.  2.  If you choose to wash your hair, wash your hair first as usual with your normal  shampoo.  3.  After you shampoo, rinse your hair and body thoroughly to remove the shampoo.                             4.  Use CHG as you would any other liquid soap.  You can apply chg directly to the skin and wash.  Gently with a scrungie or clean washcloth.  5.  Apply the CHG Soap to your body ONLY FROM THE NECK DOWN.   Do not use on face/ open                           Wound or open sores. Avoid contact with eyes, ears mouth and genitals (private parts).                       Wash face,  Genitals (private parts) with your normal soap.             6.  Wash thoroughly, paying special attention to the area where your  surgery  will be performed.  7.  Thoroughly rinse your body with warm water from the neck down.  8.  DO NOT shower/wash with your normal soap after using and rinsing off the CHG Soap.            9.  Pat yourself dry with a clean towel.            10.  Wear clean pajamas.            11.  Place clean sheets on your bed the night of your first shower and do not  sleep with pets.  ON THE DAY OF SURGERY : Do not apply any lotions/deodorants the morning of surgery.  Please wear clean clothes to the hospital/surgery center.    FAILURE TO FOLLOW THESE INSTRUCTIONS MAY RESULT IN THE CANCELLATION OF YOUR SURGERY  PATIENT SIGNATURE_________________________________  NURSE SIGNATURE__________________________________  ________________________________________________________________________

## 2022-12-20 ENCOUNTER — Encounter (HOSPITAL_COMMUNITY): Payer: Self-pay

## 2022-12-20 ENCOUNTER — Other Ambulatory Visit: Payer: Self-pay

## 2022-12-20 ENCOUNTER — Encounter (HOSPITAL_COMMUNITY)
Admission: RE | Admit: 2022-12-20 | Discharge: 2022-12-20 | Disposition: A | Discharge status: Home / Self Care | Payer: 59 | Source: Ambulatory Visit | Attending: Surgery | Admitting: Surgery

## 2022-12-20 VITALS — BP 129/67 | HR 90 | Temp 98.8°F | Resp 22 | Ht 70.0 in | Wt 248.0 lb

## 2022-12-20 DIAGNOSIS — F1090 Alcohol use, unspecified, uncomplicated: Secondary | ICD-10-CM

## 2022-12-20 DIAGNOSIS — Z789 Other specified health status: Secondary | ICD-10-CM | POA: Insufficient documentation

## 2022-12-20 DIAGNOSIS — Z01812 Encounter for preprocedural laboratory examination: Secondary | ICD-10-CM | POA: Insufficient documentation

## 2022-12-20 DIAGNOSIS — I10 Essential (primary) hypertension: Secondary | ICD-10-CM

## 2022-12-20 HISTORY — DX: Pneumonia, unspecified organism: J18.9

## 2022-12-20 HISTORY — DX: Unspecified osteoarthritis, unspecified site: M19.90

## 2022-12-21 DIAGNOSIS — M542 Cervicalgia: Secondary | ICD-10-CM | POA: Insufficient documentation

## 2023-01-07 ENCOUNTER — Ambulatory Visit (HOSPITAL_COMMUNITY)
Admission: RE | Admit: 2023-01-07 | Discharge: 2023-01-07 | Disposition: A | Discharge status: Home / Self Care | Payer: 59 | Source: Ambulatory Visit | Attending: Surgery | Admitting: Surgery

## 2023-01-07 ENCOUNTER — Other Ambulatory Visit: Payer: Self-pay

## 2023-01-07 ENCOUNTER — Encounter (HOSPITAL_COMMUNITY)
Admission: RE | Disposition: A | Discharge status: Home / Self Care | Payer: Self-pay | Source: Ambulatory Visit | Attending: Surgery

## 2023-01-07 ENCOUNTER — Telehealth: Payer: Self-pay | Admitting: Family Medicine

## 2023-01-07 ENCOUNTER — Encounter (HOSPITAL_COMMUNITY): Payer: Self-pay | Admitting: Surgery

## 2023-01-07 ENCOUNTER — Ambulatory Visit (HOSPITAL_COMMUNITY): Payer: 59 | Admitting: Physician Assistant

## 2023-01-07 ENCOUNTER — Ambulatory Visit (HOSPITAL_BASED_OUTPATIENT_CLINIC_OR_DEPARTMENT_OTHER): Payer: 59 | Admitting: Physician Assistant

## 2023-01-07 DIAGNOSIS — J449 Chronic obstructive pulmonary disease, unspecified: Secondary | ICD-10-CM

## 2023-01-07 DIAGNOSIS — M549 Dorsalgia, unspecified: Secondary | ICD-10-CM | POA: Insufficient documentation

## 2023-01-07 DIAGNOSIS — Z87891 Personal history of nicotine dependence: Secondary | ICD-10-CM

## 2023-01-07 DIAGNOSIS — Z789 Other specified health status: Secondary | ICD-10-CM

## 2023-01-07 DIAGNOSIS — Z1509 Genetic susceptibility to other malignant neoplasm: Secondary | ICD-10-CM | POA: Insufficient documentation

## 2023-01-07 DIAGNOSIS — G8929 Other chronic pain: Secondary | ICD-10-CM | POA: Diagnosis not present

## 2023-01-07 DIAGNOSIS — E669 Obesity, unspecified: Secondary | ICD-10-CM | POA: Insufficient documentation

## 2023-01-07 DIAGNOSIS — I10 Essential (primary) hypertension: Secondary | ICD-10-CM

## 2023-01-07 DIAGNOSIS — Z6836 Body mass index (BMI) 36.0-36.9, adult: Secondary | ICD-10-CM | POA: Insufficient documentation

## 2023-01-07 DIAGNOSIS — E785 Hyperlipidemia, unspecified: Secondary | ICD-10-CM | POA: Diagnosis not present

## 2023-01-07 DIAGNOSIS — K4090 Unilateral inguinal hernia, without obstruction or gangrene, not specified as recurrent: Secondary | ICD-10-CM | POA: Diagnosis present

## 2023-01-07 DIAGNOSIS — Z9049 Acquired absence of other specified parts of digestive tract: Secondary | ICD-10-CM | POA: Insufficient documentation

## 2023-01-07 DIAGNOSIS — K219 Gastro-esophageal reflux disease without esophagitis: Secondary | ICD-10-CM | POA: Diagnosis not present

## 2023-01-07 DIAGNOSIS — Z79899 Other long term (current) drug therapy: Secondary | ICD-10-CM | POA: Insufficient documentation

## 2023-01-07 HISTORY — PX: INGUINAL HERNIA REPAIR: SHX194

## 2023-01-07 LAB — COMPREHENSIVE METABOLIC PANEL
ALT: 54 U/L — ABNORMAL HIGH (ref 0–44)
AST: 67 U/L — ABNORMAL HIGH (ref 15–41)
Albumin: 3.5 g/dL (ref 3.5–5.0)
Alkaline Phosphatase: 47 U/L (ref 38–126)
Anion gap: 11 (ref 5–15)
BUN: 21 mg/dL — ABNORMAL HIGH (ref 6–20)
CO2: 26 mmol/L (ref 22–32)
Calcium: 8.8 mg/dL — ABNORMAL LOW (ref 8.9–10.3)
Chloride: 97 mmol/L — ABNORMAL LOW (ref 98–111)
Creatinine, Ser: 1.02 mg/dL (ref 0.61–1.24)
GFR, Estimated: >60 mL/min (ref >60)
Glucose, Bld: 117 mg/dL — ABNORMAL HIGH (ref 70–99)
Potassium: 2.6 mmol/L — CL (ref 3.5–5.1)
Sodium: 134 mmol/L — ABNORMAL LOW (ref 135–145)
Total Bilirubin: 1.1 mg/dL (ref 0.3–1.2)
Total Protein: 6.6 g/dL (ref 6.5–8.1)

## 2023-01-07 LAB — CBC
HCT: 44.6 % (ref 39.0–52.0)
Hemoglobin: 15.7 g/dL (ref 13.0–17.0)
MCH: 32.8 pg (ref 26.0–34.0)
MCHC: 35.2 g/dL (ref 30.0–36.0)
MCV: 93.3 fL (ref 80.0–100.0)
Platelets: 196 10*3/uL (ref 150–400)
RBC: 4.78 MIL/uL (ref 4.22–5.81)
RDW: 13.1 % (ref 11.5–15.5)
WBC: 4.9 10*3/uL (ref 4.0–10.5)
nRBC: 0 % (ref 0.0–0.2)

## 2023-01-07 LAB — POTASSIUM: Potassium: 3 mmol/L — ABNORMAL LOW (ref 3.5–5.1)

## 2023-01-07 SURGERY — REPAIR, HERNIA, INGUINAL, ADULT
Anesthesia: General | Laterality: Left

## 2023-01-07 MED ORDER — HYDROMORPHONE HCL 1 MG/ML IJ SOLN
INTRAMUSCULAR | Status: AC
  Filled 2023-01-07: qty 1

## 2023-01-07 MED ORDER — CHLORHEXIDINE GLUCONATE 4 % EX LIQD: 60.0000 mL | Freq: Once | CUTANEOUS | Status: DC

## 2023-01-07 MED ORDER — SODIUM CHLORIDE 0.9% FLUSH: 3.0000 mL | Freq: Two times a day (BID) | INTRAVENOUS | Status: DC

## 2023-01-07 MED ORDER — FENTANYL CITRATE (PF) 100 MCG/2ML IJ SOLN
INTRAMUSCULAR | Status: DC | PRN
  Administered 2023-01-07 (×2): 50 ug via INTRAVENOUS
  Administered 2023-01-07: 100 ug via INTRAVENOUS

## 2023-01-07 MED ORDER — FENTANYL CITRATE (PF) 100 MCG/2ML IJ SOLN
INTRAMUSCULAR | Status: AC
  Filled 2023-01-07: qty 2

## 2023-01-07 MED ORDER — BUPIVACAINE LIPOSOME 1.3 % IJ SUSP
INTRAMUSCULAR | Status: AC
  Filled 2023-01-07: qty 20

## 2023-01-07 MED ORDER — CHLORHEXIDINE GLUCONATE 0.12 % MT SOLN
15.0000 mL | Freq: Once | OROMUCOSAL | Status: AC
  Administered 2023-01-07: 15 mL via OROMUCOSAL

## 2023-01-07 MED ORDER — SODIUM CHLORIDE 0.9% FLUSH: 3.0000 mL | INTRAVENOUS | Status: DC | PRN

## 2023-01-07 MED ORDER — BUPIVACAINE HCL (PF) 0.25 % IJ SOLN
INTRAMUSCULAR | Status: AC
  Filled 2023-01-07: qty 30

## 2023-01-07 MED ORDER — ROCURONIUM BROMIDE 10 MG/ML (PF) SYRINGE
PREFILLED_SYRINGE | INTRAVENOUS | Status: AC
  Filled 2023-01-07: qty 10

## 2023-01-07 MED ORDER — ONDANSETRON HCL 4 MG/2ML IJ SOLN
INTRAMUSCULAR | Status: AC
  Filled 2023-01-07: qty 2

## 2023-01-07 MED ORDER — POTASSIUM CHLORIDE 10 MEQ/100ML IV SOLN
10.0000 meq | INTRAVENOUS | Status: AC
  Administered 2023-01-07 (×4): 10 meq via INTRAVENOUS
  Filled 2023-01-07: qty 100
  Filled 2023-01-07: qty 300

## 2023-01-07 MED ORDER — ONDANSETRON HCL 4 MG/2ML IJ SOLN
INTRAMUSCULAR | Status: DC | PRN
  Administered 2023-01-07: 4 mg via INTRAVENOUS

## 2023-01-07 MED ORDER — FENTANYL CITRATE PF 50 MCG/ML IJ SOSY: 25.0000 ug | PREFILLED_SYRINGE | INTRAMUSCULAR | Status: DC | PRN

## 2023-01-07 MED ORDER — ROCURONIUM BROMIDE 10 MG/ML (PF) SYRINGE
PREFILLED_SYRINGE | INTRAVENOUS | Status: DC | PRN
  Administered 2023-01-07: 100 mg via INTRAVENOUS

## 2023-01-07 MED ORDER — LACTATED RINGERS IV SOLN: INTRAVENOUS | Status: DC

## 2023-01-07 MED ORDER — MEPERIDINE HCL 50 MG/ML IJ SOLN: 6.2500 mg | INTRAMUSCULAR | Status: DC | PRN

## 2023-01-07 MED ORDER — ACETAMINOPHEN 500 MG PO TABS
1000.0000 mg | ORAL_TABLET | ORAL | Status: AC
  Administered 2023-01-07: 1000 mg via ORAL
  Filled 2023-01-07: qty 2

## 2023-01-07 MED ORDER — SUGAMMADEX SODIUM 200 MG/2ML IV SOLN
INTRAVENOUS | Status: DC | PRN
  Administered 2023-01-07: 300 mg via INTRAVENOUS

## 2023-01-07 MED ORDER — HYDROMORPHONE HCL 1 MG/ML IJ SOLN
0.2500 mg | INTRAMUSCULAR | Status: DC | PRN
  Administered 2023-01-07: 0.5 mg via INTRAVENOUS

## 2023-01-07 MED ORDER — OXYCODONE HCL 5 MG PO TABS
5.0000 mg | ORAL_TABLET | ORAL | Status: DC | PRN
  Administered 2023-01-07: 10 mg via ORAL

## 2023-01-07 MED ORDER — MIDAZOLAM HCL 5 MG/5ML IJ SOLN
INTRAMUSCULAR | Status: DC | PRN
  Administered 2023-01-07: 2 mg via INTRAVENOUS

## 2023-01-07 MED ORDER — BUPIVACAINE LIPOSOME 1.3 % IJ SUSP: 20.0000 mL | Freq: Once | INTRAMUSCULAR | Status: DC

## 2023-01-07 MED ORDER — HYDROCODONE-ACETAMINOPHEN 5-325 MG PO TABS: 1.0000 | ORAL_TABLET | ORAL | 0 refills | Status: AC | PRN

## 2023-01-07 MED ORDER — DEXMEDETOMIDINE HCL IN NACL 80 MCG/20ML IV SOLN
INTRAVENOUS | Status: DC | PRN
  Administered 2023-01-07: 12 ug via BUCCAL

## 2023-01-07 MED ORDER — DEXAMETHASONE SODIUM PHOSPHATE 10 MG/ML IJ SOLN
INTRAMUSCULAR | Status: DC | PRN
  Administered 2023-01-07: 5 mg via INTRAVENOUS

## 2023-01-07 MED ORDER — PROMETHAZINE HCL 25 MG/ML IJ SOLN: 6.2500 mg | INTRAMUSCULAR | Status: DC | PRN

## 2023-01-07 MED ORDER — ACETAMINOPHEN 650 MG RE SUPP: 650.0000 mg | RECTAL | Status: DC | PRN

## 2023-01-07 MED ORDER — ACETAMINOPHEN 325 MG PO TABS: 650.0000 mg | ORAL_TABLET | ORAL | Status: DC | PRN

## 2023-01-07 MED ORDER — GABAPENTIN 300 MG PO CAPS
300.0000 mg | ORAL_CAPSULE | ORAL | Status: AC
  Administered 2023-01-07: 300 mg via ORAL
  Filled 2023-01-07: qty 1

## 2023-01-07 MED ORDER — OXYCODONE HCL 5 MG PO TABS
ORAL_TABLET | ORAL | Status: AC
  Filled 2023-01-07: qty 2

## 2023-01-07 MED ORDER — BUPIVACAINE LIPOSOME 1.3 % IJ SUSP
INTRAMUSCULAR | Status: DC | PRN
  Administered 2023-01-07: 50 mL

## 2023-01-07 MED ORDER — ORAL CARE MOUTH RINSE: 15.0000 mL | Freq: Once | OROMUCOSAL | Status: AC

## 2023-01-07 MED ORDER — PROPOFOL 10 MG/ML IV BOLUS
INTRAVENOUS | Status: AC
  Filled 2023-01-07: qty 20

## 2023-01-07 MED ORDER — DOCUSATE SODIUM 100 MG PO CAPS: 100.0000 mg | ORAL_CAPSULE | Freq: Two times a day (BID) | ORAL | 0 refills | Status: AC

## 2023-01-07 MED ORDER — SODIUM CHLORIDE 0.9 % IV SOLN: INTRAVENOUS | Status: DC

## 2023-01-07 MED ORDER — AMISULPRIDE (ANTIEMETIC) 5 MG/2ML IV SOLN: 10.0000 mg | Freq: Once | INTRAVENOUS | Status: DC | PRN

## 2023-01-07 MED ORDER — CEFAZOLIN SODIUM-DEXTROSE 2-4 GM/100ML-% IV SOLN
2.0000 g | INTRAVENOUS | Status: AC
  Administered 2023-01-07: 2 g via INTRAVENOUS
  Filled 2023-01-07: qty 100

## 2023-01-07 MED ORDER — LIDOCAINE 2% (20 MG/ML) 5 ML SYRINGE
INTRAMUSCULAR | Status: DC | PRN
  Administered 2023-01-07: 100 mg via INTRAVENOUS

## 2023-01-07 MED ORDER — OXYCODONE HCL 5 MG PO CAPS: 5.0000 mg | ORAL_CAPSULE | Freq: Three times a day (TID) | ORAL | 0 refills | Status: DC | PRN

## 2023-01-07 MED ORDER — SODIUM CHLORIDE 0.9 % IV SOLN: 250.0000 mL | INTRAVENOUS | Status: DC | PRN

## 2023-01-07 MED ORDER — MIDAZOLAM HCL 2 MG/2ML IJ SOLN
INTRAMUSCULAR | Status: AC
  Filled 2023-01-07: qty 2

## 2023-01-07 MED ORDER — PROPOFOL 10 MG/ML IV BOLUS
INTRAVENOUS | Status: DC | PRN
  Administered 2023-01-07: 150 mg via INTRAVENOUS

## 2023-01-07 MED ORDER — 0.9 % SODIUM CHLORIDE (POUR BTL) OPTIME
TOPICAL | Status: DC | PRN
  Administered 2023-01-07: 1000 mL

## 2023-01-07 MED ORDER — DEXAMETHASONE SODIUM PHOSPHATE 10 MG/ML IJ SOLN
INTRAMUSCULAR | Status: AC
  Filled 2023-01-07: qty 1

## 2023-01-07 SURGICAL SUPPLY — 46 items
APL PRP STRL LF DISP 70% ISPRP (MISCELLANEOUS) ×1
APL SKNCLS STERI-STRIP NONHPOA (GAUZE/BANDAGES/DRESSINGS) ×1
BAG COUNTER SPONGE SURGICOUNT (BAG) IMPLANT
BAG SPNG CNTER NS LX DISP (BAG)
BENZOIN TINCTURE PRP APPL 2/3 (GAUZE/BANDAGES/DRESSINGS) ×1 IMPLANT
BLADE SURG 15 STRL LF DISP TIS (BLADE) ×1 IMPLANT
BLADE SURG 15 STRL SS (BLADE) ×1
CHLORAPREP W/TINT 26 (MISCELLANEOUS) ×1 IMPLANT
COVER SURGICAL LIGHT HANDLE (MISCELLANEOUS) ×1 IMPLANT
DRAIN PENROSE 0.5X18 (DRAIN) ×1 IMPLANT
DRAPE LAPAROSCOPIC ABDOMINAL (DRAPES) ×1 IMPLANT
ELECT REM PT RETURN 15FT ADLT (MISCELLANEOUS) ×1 IMPLANT
GAUZE SPONGE 4X4 12PLY STRL (GAUZE/BANDAGES/DRESSINGS) IMPLANT
GLOVE BIO SURGEON STRL SZ 6 (GLOVE) ×1 IMPLANT
GLOVE INDICATOR 6.5 STRL GRN (GLOVE) ×1 IMPLANT
GLOVE SS BIOGEL STRL SZ 6 (GLOVE) ×1 IMPLANT
GOWN STRL REUS W/ TWL LRG LVL3 (GOWN DISPOSABLE) ×1 IMPLANT
GOWN STRL REUS W/ TWL XL LVL3 (GOWN DISPOSABLE) IMPLANT
GOWN STRL REUS W/TWL LRG LVL3 (GOWN DISPOSABLE) ×1
GOWN STRL REUS W/TWL XL LVL3 (GOWN DISPOSABLE)
KIT BASIN OR (CUSTOM PROCEDURE TRAY) ×1 IMPLANT
KIT TURNOVER KIT A (KITS) IMPLANT
MARKER SKIN DUAL TIP RULER LAB (MISCELLANEOUS) ×1 IMPLANT
MESH ULTRAPRO 3X6 7.6X15CM (Mesh General) IMPLANT
NEEDLE HYPO 22GX1.5 SAFETY (NEEDLE) ×1 IMPLANT
PACK BASIC VI WITH GOWN DISP (CUSTOM PROCEDURE TRAY) ×1 IMPLANT
PENCIL SMOKE EVACUATOR (MISCELLANEOUS) IMPLANT
SPIKE FLUID TRANSFER (MISCELLANEOUS) ×1 IMPLANT
SPONGE T-LAP 4X18 ~~LOC~~+RFID (SPONGE) ×1 IMPLANT
STRIP CLOSURE SKIN 1/2X4 (GAUZE/BANDAGES/DRESSINGS) ×1 IMPLANT
SUT ETHIBOND 0 MO6 C/R (SUTURE) ×1 IMPLANT
SUT MNCRL AB 4-0 PS2 18 (SUTURE) ×1 IMPLANT
SUT PDS AB 0 CT1 36 (SUTURE) ×2 IMPLANT
SUT SILK 3 0 (SUTURE) ×1
SUT SILK 3-0 18XBRD TIE 12 (SUTURE) ×1 IMPLANT
SUT VIC AB 3-0 SH 27 (SUTURE) ×3
SUT VIC AB 3-0 SH 27XBRD (SUTURE) ×2 IMPLANT
SUT VICRYL 0 UR6 27IN ABS (SUTURE) IMPLANT
SUT VICRYL 3 0 BR 18  UND (SUTURE) ×1
SUT VICRYL 3 0 BR 18 UND (SUTURE) ×1 IMPLANT
SYR CONTROL 10ML LL (SYRINGE) ×1 IMPLANT
TAPE PAPER 3X10 WHT MICROPORE (GAUZE/BANDAGES/DRESSINGS) IMPLANT
TAPE STRIPS DRAPE STRL (GAUZE/BANDAGES/DRESSINGS) IMPLANT
TOWEL OR 17X26 10 PK STRL BLUE (TOWEL DISPOSABLE) ×1 IMPLANT
TOWEL OR NON WOVEN STRL DISP B (DISPOSABLE) ×1 IMPLANT
TRAY FOLEY MTR SLVR 16FR STAT (SET/KITS/TRAYS/PACK) IMPLANT

## 2023-01-07 NOTE — Discharge Instructions (Signed)
HERNIA REPAIR: POST OP INSTRUCTIONS   EAT Gradually transition to a high fiber diet with a fiber supplement over the next few weeks after discharge.  Start with a pureed / full liquid diet (see below)  WALK Walk an hour a day (cumulative- not all at once).  Control your pain to do that.    CONTROL PAIN Control pain so that you can walk, sleep, tolerate sneezing/coughing, and go up/down stairs.  HAVE A BOWEL MOVEMENT DAILY Keep your bowels regular to avoid problems.  OK to try a laxative to override constipation.  OK to use an antidiarrheal to slow down diarrhea.  Call if not better after 2 tries  CALL IF YOU HAVE PROBLEMS/CONCERNS Call if you are still struggling despite following these instructions. Call if you have concerns not answered by these instructions  ######################################################################    DIET: Follow a light bland diet & liquids the first 24 hours after arrival home, such as soup, liquids, starches, etc.  Be sure to drink plenty of fluids.  Quickly advance to a usual solid diet within a few days.  Avoid fast food or heavy meals initially as you are more likely to get nauseated or have irregular bowels.  A low-sugar, high-fiber diet for the rest of your life is ideal.   Take your usually prescribed home medications unless otherwise directed.  PAIN CONTROL: Pain is best controlled by a usual combination of three different methods TOGETHER: Ice/Heat Over the counter pain medication Prescription pain medication Most patients will experience some swelling and bruising around the hernia(s) such as the bellybutton, groins, or old incisions.  Ice packs or heating pads (30-60 minutes up to 6 times a day) will help. Use ice for the first few days to help decrease swelling and bruising, then switch to heat to help relax tight/sore spots and speed recovery.  Some people prefer to use ice alone, heat alone, alternating between ice & heat.  Experiment  to what works for you.  Swelling and bruising can take several weeks to resolve.   It is helpful to take an over-the-counter pain medication regularly for the first days: Naproxen (Aleve, etc)  Two 220mg tabs twice a day OR Ibuprofen (Advil, etc) Three 200mg tabs four times a day (every meal & bedtime) AND Acetaminophen (Tylenol, etc) 325-650mg four times a day (every meal & bedtime) A  prescription for pain medication should be given to you upon discharge.  Take your pain medication as prescribed, IF NEEDED.  If you are having problems/concerns with the prescription medicine (does not control pain, nausea, vomiting, rash, itching, etc), please call us [phone] to see if we need to switch you to a different pain medicine that will work better for you and/or control your side effect better. If you need a refill on your pain medication, please contact your pharmacy.  They will contact our office to request authorization. Prescriptions will not be filled after 5 pm or on week-ends.  Avoid getting constipated.  Between the surgery and the pain medications, it is common to experience some constipation.  Increasing fluid intake and taking a fiber supplement (such as Metamucil, Citrucel, FiberCon, MiraLax, etc) 1-2 times a day regularly will usually help prevent this problem from occurring.  A mild laxative (prune juice, Milk of Magnesia, MiraLax, etc) should be taken according to package directions if there are no bowel movements after 48 hours.    Wash / shower every day, starting 2 days after surgery.  You may shower over   the steri strips or skin glue which are waterproof.  No rubbing, scrubbing, lotions or ointments to incision(s). Do not soak or submerge.   Remove your outer bandage 2 days after surgery. Steri strips (if present) will peel off after 1-2 weeks. Glue (if present) will flake off after about 2 weeks.  You may leave the incision open to air.  You may replace a dressing/Band-Aid to cover  an incision for comfort if you wish.  Continue to shower over incision(s) after the dressing is off.  ACTIVITIES as tolerated:   You may resume regular (light) daily activities beginning the next day--such as daily self-care, walking, climbing stairs--gradually increasing activities as tolerated.  Control your pain so that you can walk an hour a day.  If you can walk 30 minutes without difficulty, it is safe to try more intense activity such as jogging, treadmill, bicycling, low-impact aerobics, swimming, etc. Refrain from the most intensive and strenuous activity such as sit-ups, heavy lifting, contact sports, etc  Refrain from any heavy lifting or straining until 6 weeks after surgery.   DO NOT PUSH THROUGH PAIN.  Let pain be your guide: If it hurts to do something, don't do it.  Pain is your body warning you to avoid that activity for another week until the pain goes down. You may drive when you are no longer taking prescription pain medication, you can comfortably wear a seatbelt, and you can safely maneuver your car and apply brakes. You may have sexual intercourse when it is comfortable.   FOLLOW UP in our office Please call [facility] at [phone] to set up an appointment to see your surgeon in the office for a follow-up appointment approximately 2-3 weeks after your surgery. Make sure that you call for this appointment the day you arrive home to insure a convenient appointment time.  9.  If you have disability of FMLA / Family leave forms, please bring the forms to the office for processing.  (do not give to your surgeon).  WHEN TO CALL US [phone]: Poor pain control Reactions / problems with new medications (rash/itching, nausea, etc)  Fever over 101.5 F (38.5 C) Inability to urinate Nausea and/or vomiting Worsening swelling or bruising Continued bleeding from incision. Increased pain, redness, or drainage from the incision   The clinic staff is available to answer your  questions during regular business hours (8:30am-5pm).  Please don't hesitate to call and ask to speak to one of our nurses for clinical concerns.   If you have a medical emergency, go to the nearest emergency room or call 911.  A surgeon from [facility] is always on call at the hospitals in [place]  [facility] [address], [address], [place], [place]  [address] ?  [address] [address], [place], [place]   [address] MAIN: [phone] ? TOLL FREE: [phone] ? FAX: [phone] [email or web address]  

## 2023-01-07 NOTE — Telephone Encounter (Signed)
Patient aware Dr. Livia Snellen is out of office but would like to know if back up provider would like to increase medication?  Told patient this would  more than likely have to wait until PCP returns.

## 2023-01-07 NOTE — Anesthesia Postprocedure Evaluation (Signed)
Anesthesia Post Note  Patient: Charles Santos  Procedure(s) Performed: OPEN LEFT INGUINAL HERNIA REPAIR (Left)     Patient location during evaluation: PACU Anesthesia Type: General Level of consciousness: awake and alert Pain management: pain level controlled Vital Signs Assessment: post-procedure vital signs reviewed and stable Respiratory status: spontaneous breathing, nonlabored ventilation and respiratory function stable Cardiovascular status: blood pressure returned to baseline and stable Postop Assessment: no apparent nausea or vomiting Anesthetic complications: no   No notable events documented.  Last Vitals:  Vitals:   01/07/23 1300 01/07/23 1325  BP: 139/74 (!) 150/80  Pulse: 70 73  Resp: 11 14  Temp: 36.5 C   SpO2: 95% 97%    Last Pain:  Vitals:   01/07/23 1325  TempSrc:   PainSc: 0-No pain                 Lynda Rainwater

## 2023-01-07 NOTE — Progress Notes (Signed)
Received report from lab critical lab value for K+ 2.6. Notified Dr Miller/Anesthesia; no orders given. Spoke with Lennette Bihari OR 1 to report to Dr Kae Heller lab value.

## 2023-01-07 NOTE — Anesthesia Procedure Notes (Signed)
Procedure Name: Intubation Date/Time: 01/07/2023 9:57 AM  Performed by: Lynda Rainwater, MDPre-anesthesia Checklist: Patient identified, Emergency Drugs available, Suction available, Patient being monitored and Timeout performed Patient Re-evaluated:Patient Re-evaluated prior to induction Preoxygenation: Pre-oxygenation with 100% oxygen Induction Type: IV induction Ventilation: Mask ventilation without difficulty Laryngoscope Size: 4 Grade View: Grade II Tube type: Oral Tube size: 7.5 mm Number of attempts: 1 Placement Confirmation: ETT inserted through vocal cords under direct vision, positive ETCO2 and breath sounds checked- equal and bilateral

## 2023-01-07 NOTE — Anesthesia Preprocedure Evaluation (Signed)
Anesthesia Evaluation  Patient identified by MRN, date of birth, ID band Patient awake    Reviewed: Allergy & Precautions, NPO status , Patient's Chart, lab work & pertinent test results  History of Anesthesia Complications (+) DIFFICULT AIRWAY and history of anesthetic complications  Airway Mallampati: III  TM Distance: >3 FB Neck ROM: Full    Dental no notable dental hx.    Pulmonary COPD, former smoker   Pulmonary exam normal breath sounds clear to auscultation       Cardiovascular hypertension, Pt. on medications Normal cardiovascular exam Rhythm:Regular Rate:Normal     Neuro/Psych  Headaches PSYCHIATRIC DISORDERS Anxiety Depression       GI/Hepatic Neg liver ROS,GERD  Medicated and Controlled,,Cancer of transverse colon    Endo/Other  negative endocrine ROS    Renal/GU negative Renal ROS  negative genitourinary   Musculoskeletal  (+) Arthritis , Osteoarthritis,    Abdominal  (+) + obese  Peds  Hematology HLD   Anesthesia Other Findings lynch syndrome hx of colon cancer hx of duodenal polyps  Reproductive/Obstetrics                             Anesthesia Physical Anesthesia Plan  ASA: 3  Anesthesia Plan: General   Post-op Pain Management: Dilaudid IV, Tylenol PO (pre-op)* and Gabapentin PO (pre-op)*   Induction: Intravenous  PONV Risk Score and Plan: 2 and Treatment may vary due to age or medical condition, Ondansetron and Midazolam  Airway Management Planned: Oral ETT  Additional Equipment: None  Intra-op Plan:   Post-operative Plan: Extubation in OR  Informed Consent: I have reviewed the patients History and Physical, chart, labs and discussed the procedure including the risks, benefits and alternatives for the proposed anesthesia with the patient or authorized representative who has indicated his/her understanding and acceptance.       Plan Discussed with:  CRNA  Anesthesia Plan Comments:         Anesthesia Quick Evaluation

## 2023-01-07 NOTE — Progress Notes (Signed)
Potassium came up to 3.0 after 4 runs, per Dr. Windle Guard patient is okay to discharge home.

## 2023-01-07 NOTE — Op Note (Signed)
Operative Note  Charles Santos  [id number]  [id number]  01/07/2023   Surgeon: Clovis Riley MD FACS  Assistant: Eben Burow MD ([id number]) I was personally present during the key and critical portions of this procedure and immediately available throughout the entire procedure, as documented in my operative note.   Procedure performed: Open left inguinal hernia repair with mesh   Preop diagnosis:  left inguinal hernia   Post-op diagnosis/intraop findings:  moderate left direct inguinal hernia with severe attenuation of the inguinal floor   Specimens: none   EBL: 5cc   Complications: none   Description of procedure: After confirming informed consent, the patient was taken to the operating room and placed supine on operating room table where general anesthesia was initiated, preoperative antibiotics were administered, SCDs applied, and a formal timeout was performed. The groin was clipped, prepped and draped in the usual sterile fashion. An oblique incision was made the just above the inguinal ligament after infiltrating the tissues with local anesthetic. Soft tissues were dissected using electrocautery until the external oblique aponeurosis was encountered.  This was actually quite splayed and there was essentially disruption of the external ring.  The edges of the external oblique were identified and a plane was bluntly developed between the spermatic cord and the external oblique. The ilioinguinal nerve was divided between hemostats and each and ligated with 3-0 Vicryl ties. The spermatic cord was then bluntly dissected away from the pubic tubercle and encircled with a Penrose. Inspection of the inguinal anatomy revealed a moderate direct hernia with severe attenuation, essentially complete disruption of the inguinal floor.  The spermatic cord was carefully dissected and confirmed to be free of any indirect hernia sac.  The direct hernia was reduced intact and the inguinal floor was  reconstructed during the conjoint tendon to the inguinal ligament with interrupted 0 PDS, leaving an internal ring just sufficient for the cord structures. A 3 x 6 piece of ultra Pro mesh was brought onto the field and trimmed to approximate the field. This was sutured to the pubic tubercle fascia, inferior shelving edge and to the internal oblique superiorly with interrupted 0 ethibonds. The tails of the mesh were wrapped around the spermatic cord, ensuring adequate room for the cord, and sutured to each other with 0 ethibond, and then directed laterally to lie flat beneath the external oblique aponeurosis. Hemostasis was ensured within the wound. The Penrose was removed. The external oblique aponeurosis was reapproximated with a running 3-0 Vicryl to re-create a narrowed external ring. More local was infiltrated around the pubic tubercle and in the plane just below the external oblique. The Scarpa's was reapproximated with interrupted 3-0 Vicryls. The skin was closed with a running subcuticular 4-0 Monocryl. The remainder of the local was injected in the subcutaneous and subcuticular space. The field was then cleaned, benzoin and Steri-Strips and sterile bandage were applied. The patient was then awakened extubated and taken to PACU in stable condition.    All counts were correct at the completion of the case

## 2023-01-07 NOTE — Telephone Encounter (Signed)
Patient needs to be seen, if the surgeon felt like he needed to increase the they would have increase it up to the surgery or short of, if not he needs to be seen by [place] and evaluated and rechecked.

## 2023-01-07 NOTE — Transfer of Care (Signed)
Immediate Anesthesia Transfer of Care Note  Patient: Charles Santos  Procedure(s) Performed: OPEN LEFT INGUINAL HERNIA REPAIR (Left)  Patient Location: PACU  Anesthesia Type:General  Level of Consciousness: awake, alert , and patient cooperative  Airway & Oxygen Therapy: Patient Spontanous Breathing and Patient connected to nasal cannula oxygen  Post-op Assessment: Report given to RN and Post -op Vital signs reviewed and stable  Post vital signs: Reviewed and stable  Last Vitals:  Vitals Value Taken Time  BP 113/83 01/07/23 1130  Temp    Pulse 73 01/07/23 1132  Resp 14 01/07/23 1132  SpO2 98 % 01/07/23 1132  Vitals shown include unvalidated device data.  Last Pain:  Vitals:   01/07/23 0717  TempSrc: Oral  PainSc:          Complications: No notable events documented.

## 2023-01-07 NOTE — Telephone Encounter (Signed)
lmtcb

## 2023-01-07 NOTE — H&P (Signed)
Jeanmarie Plant [id number]    Referring Provider:  Gabriel Carina, MD     Subjective    Chief Complaint: New Consultation and Inguinal Hernia       History of Present Illness: 52 year old male with history of tobacco abuse-quit 2 years ago (23-pack-year smoking history) alcohol abuse (states he drinks more than usual, usually help himself fall asleep at night), chronic back pain/radiculopathy with prior lumbar surgeries, pain contract, obesity, [name], Surgical history includes laparoscopic converted to open subtotal colectomy with 60 minutes of adhesiolysis by Dr. Dalbert Batman in 2017 for [name], previous RIGHT inguinal hernia repair He has developed more left groin pain, usually at the end of the day after standing or lifting.  He works at a [employer] and occasionally has to do fairly strenuous things for this.  Has not felt a bulge or mass in the left groin per se. He had a CT scan in February 2022 which I have reviewed personally; this does show bilateral fat-containing inguinal hernias as well as some bladder herniation on the left side.   Review of Systems: A complete review of systems was obtained from the patient.  I have reviewed this information and discussed as appropriate with the patient.  See HPI as well for other ROS.     Medical History:     Past Medical History:  Diagnosis Date   Anxiety     GERD (gastroesophageal reflux disease)     Hyperlipidemia     Hypertension        There is no problem list on file for this patient.          Past Surgical History:  Procedure Laterality Date   APPENDECTOMY       colon resectomy       COLON SURGERY       HERNIA REPAIR       Laparoscopic subtotal colectomy       Polypectomy       Transforaminal lumbar interbody fusion w/ mis 1 level (Right)               Allergies  Allergen Reactions   Oxycodone Other (See Comments)      Made pt feel like a "ZOMBIE"   Other reaction(s): Other (See  Comments)  unspecified   Adhesive Tape-Silicones Rash      Other reaction(s): Other (See Comments)  Prefers paper tape            Current Outpatient Medications on File Prior to Visit  Medication Sig Dispense Refill   chlorthalidone 25 MG tablet Take 25 mg by mouth once daily       fenofibrate 160 MG tablet Take 1 tablet by mouth once daily       naproxen (NAPROSYN) 500 MG tablet Take by mouth every 12 (twelve) hours       omega-3 fatty acids-fish oil 360-1,200 mg Cap Take by mouth       pantoprazole (PROTONIX) 40 MG DR tablet Take 40 mg by mouth once daily       potassium chloride (KLOR-CON) 20 MEQ ER tablet Take 20 mEq by mouth 3 (three) times daily       pregabalin (LYRICA) 200 MG capsule Take by mouth       QUEtiapine (SEROQUEL) 100 MG tablet Take 100 mg by mouth at bedtime        No current facility-administered medications on file prior to visit.  Family History  Problem Relation Age of Onset   Colon cancer Father        Social History        Tobacco Use  Smoking Status Former   Types: Cigarettes   Quit date: 2021   Years since quitting: 2.9  Smokeless Tobacco Never      Social History         Socioeconomic History   Marital status: Single  Tobacco Use   Smoking status: Former      Types: Cigarettes      Quit date: 2021      Years since quitting: 2.9   Smokeless tobacco: Never  Substance and Sexual Activity   Alcohol use: Yes   Drug use: Never      Objective:         Vitals:    11/18/22 1021  BP: (!) 140/60  Pulse: 91  Temp: 36.7 C (98 F)  SpO2: 96%  Weight: (!) 114.8 kg (253 lb)  Height: 177.8 cm ('5\' 10"'$ )    Body mass index is 36.3 kg/m.   Gen: A&Ox3, no distress  Unlabored respirations Abdomen is obese, soft, nontender.  There is protrusion on the left side with vasalva.  No significant protrusion on the right side.   Assessment and Plan:  Diagnoses and all orders for this visit:   Non-recurrent unilateral inguinal  hernia without obstruction or gangrene     By CT there appear to be a small recurrence on the right as well as a left direct inguinal hernia containing bladder and fat.  The right side is asymptomatic at this point and not appreciable on exam.  The left side is symptomatic.  We discussed options for hernia surgery.  Given his extensive previous abdominal surgery I recommend an open repair of his left inguinal hernia. We discussed the relevant anatomy and we discussed the technique of the procedure.  Discussed risks of bleeding, infection, pain, scarring, injury to structures in the area including nerves, blood vessels, bowel, bladder, risk of chronic pain, hernia recurrence, risk of seroma or hematoma, urinary retention, and risks of general anesthesia including cardiovascular, pulmonary, and thromboembolic complications.  His risk of hernia recurrence is increased by obesity and we discussed weight loss which will help with his recovery.  He states that he drinks [employer] and that is putting the weight on.  He is planning to back down on this.  We discussed postoperative activity restrictions-no heavy lifting more than 20 pounds or strenuous pushing/pulling etc. for 6 weeks postop.  Questions were answered.  Patient wishes to proceed with scheduling.     Mandell Pangborn Raquel James, MD

## 2023-01-07 NOTE — Telephone Encounter (Signed)
Appt made with Dr. Livia Snellen 2/1 at 1:55pm. Pt aware.

## 2023-01-10 ENCOUNTER — Encounter (HOSPITAL_COMMUNITY): Payer: Self-pay | Admitting: Surgery

## 2023-01-13 ENCOUNTER — Encounter: Payer: Self-pay | Admitting: Family Medicine

## 2023-01-13 ENCOUNTER — Ambulatory Visit (INDEPENDENT_AMBULATORY_CARE_PROVIDER_SITE_OTHER): Payer: 59 | Admitting: Family Medicine

## 2023-01-13 VITALS — BP 126/66 | HR 81 | Temp 98.0°F | Ht 70.0 in | Wt 246.0 lb

## 2023-01-13 DIAGNOSIS — I10 Essential (primary) hypertension: Secondary | ICD-10-CM

## 2023-01-13 DIAGNOSIS — K2100 Gastro-esophageal reflux disease with esophagitis, without bleeding: Secondary | ICD-10-CM | POA: Diagnosis not present

## 2023-01-13 DIAGNOSIS — E876 Hypokalemia: Secondary | ICD-10-CM | POA: Diagnosis not present

## 2023-01-13 DIAGNOSIS — E782 Mixed hyperlipidemia: Secondary | ICD-10-CM | POA: Diagnosis not present

## 2023-01-13 MED ORDER — FENOFIBRATE 160 MG PO TABS: 160.0000 mg | ORAL_TABLET | Freq: Every day | ORAL | 3 refills | Status: DC

## 2023-01-13 MED ORDER — PANTOPRAZOLE SODIUM 40 MG PO TBEC: 40.0000 mg | DELAYED_RELEASE_TABLET | Freq: Every day | ORAL | 3 refills | Status: DC

## 2023-01-13 NOTE — Progress Notes (Signed)
Subjective:  Patient ID: Charles Santos, male    DOB: July 30, 1971  Age: 52 y.o. MRN: [id number]  CC: Medical Management of Chronic Issues   HPI Walfred Bettendorf presents for  follow-up of hypertension. Patient has no history of headache chest pain or shortness of breath or recent cough. Patient also denies symptoms of TIA such as focal numbness or weakness. Patient denies side effects from medication. States taking it regularly.  Potassium dropped to 2.6 preop. Given IV bolus.`Recheck due today. Had been having leg cramps, bbut went away after the bolus. Denies heart palpitations.   Having FB sensation at incision of LIH repair done 6 days ago.   Patient in for follow-up of GERD. Currently asymptomatic taking  PPI daily. There is no chest pain or heartburn. No hematemesis and no melena. No dysphagia or choking. Onset is remote. Progression is stable. Complicating factors, none. Marland Kitchen   History Donaldo has a past medical history of Acute midline low back pain with bilateral sciatica (05/16/2020), Anxiety, Arthritis, Cancer of transverse colon ([place]) (09/17/2016), Colon cancer ([place]) (4098), Complication of anesthesia, Depression, Emphysema of lung ([place]), Family hx of colon cancer, GERD (gastroesophageal reflux disease), Headache, Hyperlipidemia, Hypertension, Pneumonia, and Vitamin D deficiency.   He has a past surgical history that includes colon resectomy (1998); Appendectomy; Laparoscopic subtotal colectomy (N/A, 09/17/2016); Proctoscopy (09/17/2016); Inguinal hernia repair; Upper gi endoscopy; polyp removal at [place] (2017); Flexible sigmoidoscopy (N/A, 05/19/2017); Esophagogastroduodenoscopy (egd) with propofol (N/A, 05/19/2017); Colonoscopy; Transforaminal lumbar interbody fusion w/ mis 1 level (Right, 02/02/2021); Polypectomy; Sigmoidoscopy; Upper gastrointestinal endoscopy; Flexible sigmoidoscopy (N/A, 06/04/2021); Esophagogastroduodenoscopy (egd) with propofol (N/A, 06/04/2021); Flexible  sigmoidoscopy (N/A, 08/12/2022); biopsy (08/12/2022); polypectomy (08/12/2022); Back surgery; and Inguinal hernia repair (Left, 01/07/2023).   His family history includes Colon cancer in his father.He reports that he quit smoking about 2 years ago. His smoking use included cigarettes. He has a 23.00 pack-year smoking history. He has never used smokeless tobacco. He reports current alcohol use of about 63.0 standard drinks of alcohol per week. He reports that he does not use drugs.  Current Outpatient Medications on File Prior to Visit  Medication Sig Dispense Refill   acetaminophen (TYLENOL) 500 MG tablet Take 500-1,000 mg by mouth every 6 (six) hours as needed for moderate pain.     chlorthalidone (HYGROTON) 25 MG tablet Take 1 tablet (25 mg total) by mouth daily. 90 tablet 3   docusate sodium (COLACE) 100 MG capsule Take 1 capsule (100 mg total) by mouth 2 (two) times daily. Okay to decrease to once daily or stop taking if having loose bowel movements 30 capsule 0   Multiple Vitamin (MULTIVITAMIN WITH MINERALS) TABS tablet Take 1 tablet by mouth daily.     naproxen (NAPROSYN) 500 MG tablet Take 1 tablet (500 mg total) by mouth 2 (two) times daily. (NEEDS TO BE SEEN BEFORE NEXT REFILL) 180 tablet 3   Omega-3 Fatty Acids (FISH OIL) 1200 MG CAPS Take 1,200 mg by mouth in the morning.     potassium chloride SA (KLOR-CON M) 20 MEQ tablet TAKE ONE TABLET THREE TIMES DAILY 90 tablet 1   pregabalin (LYRICA) 200 MG capsule Take 1 capsule (200 mg total) by mouth at bedtime. For pain relief 90 capsule 1   QUEtiapine (SEROQUEL) 100 MG tablet Take 1 tablet (100 mg total) by mouth at bedtime. 90 tablet 3   No current facility-administered medications on file prior to visit.    ROS Review of Systems  Constitutional:  Negative for fever.  Respiratory:  Negative for shortness of breath.   Cardiovascular:  Negative for chest pain.  Musculoskeletal:  Negative for arthralgias.  Skin:  Negative for rash.     Objective:  BP 126/66   Pulse 81   Temp 98 F (36.7 C)   Ht '5\' 10"'$  (1.778 m)   Wt 246 lb (111.6 kg)   SpO2 95%   BMI 35.30 kg/m   BP Readings from Last 3 Encounters:  01/13/23 126/66  01/07/23 126/63  12/20/22 129/67    Wt Readings from Last 3 Encounters:  01/13/23 246 lb (111.6 kg)  12/20/22 248 lb (112.5 kg)  09/22/22 248 lb 6.4 oz (112.7 kg)     Physical Exam Vitals reviewed.  Constitutional:      Appearance: He is well-developed.  HENT:     Head: Normocephalic and atraumatic.     Right Ear: External ear normal.     Left Ear: External ear normal.     Mouth/Throat:     Pharynx: No oropharyngeal exudate or posterior oropharyngeal erythema.  Eyes:     Pupils: Pupils are equal, round, and reactive to light.  Cardiovascular:     Rate and Rhythm: Normal rate and regular rhythm.     Heart sounds: No murmur heard. Pulmonary:     Effort: No respiratory distress.     Breath sounds: Normal breath sounds.  Abdominal:     Comments: Incision healing nicely. Likely he is feling the mesh used in the procedure  Musculoskeletal:     Cervical back: Normal range of motion and neck supple.  Neurological:     Mental Status: He is alert and oriented to person, place, and time.       Assessment & Plan:   Leighton was seen today for medical management of chronic issues.  Diagnoses and all orders for this visit:  Low serum potassium -     CMP14+EGFR  Mixed hyperlipidemia -     Lipid panel  Essential hypertension  Gastroesophageal reflux disease with esophagitis without hemorrhage  Other orders -     fenofibrate 160 MG tablet; Take 1 tablet (160 mg total) by mouth daily. For cholesterol and triglyceride -     pantoprazole (PROTONIX) 40 MG tablet; Take 1 tablet (40 mg total) by mouth daily.   Allergies as of 01/13/2023       Reactions   Oxycodone Other (See Comments)   Made pt feel like a "ZOMBIE"   Adhesive [tape] Rash, Other (See Comments)   Prefers paper  tape        Medication List        Accurate as of January 13, 2023  3:01 PM. If you have any questions, ask your nurse or doctor.          acetaminophen 500 MG tablet Commonly known as: TYLENOL Take 500-1,000 mg by mouth every 6 (six) hours as needed for moderate pain.   chlorthalidone 25 MG tablet Commonly known as: HYGROTON Take 1 tablet (25 mg total) by mouth daily.   docusate sodium 100 MG capsule Commonly known as: Colace Take 1 capsule (100 mg total) by mouth 2 (two) times daily. Okay to decrease to once daily or stop taking if having loose bowel movements   fenofibrate 160 MG tablet Take 1 tablet (160 mg total) by mouth daily. For cholesterol and triglyceride   Fish Oil 1200 MG Caps Take 1,200 mg by mouth in the morning.   multivitamin with minerals Tabs tablet Take 1 tablet by  mouth daily.   naproxen 500 MG tablet Commonly known as: NAPROSYN Take 1 tablet (500 mg total) by mouth 2 (two) times daily. (NEEDS TO BE SEEN BEFORE NEXT REFILL)   pantoprazole 40 MG tablet Commonly known as: PROTONIX Take 1 tablet (40 mg total) by mouth daily.   potassium chloride SA 20 MEQ tablet Commonly known as: KLOR-CON M TAKE ONE TABLET THREE TIMES DAILY   pregabalin 200 MG capsule Commonly known as: Lyrica Take 1 capsule (200 mg total) by mouth at bedtime. For pain relief   QUEtiapine 100 MG tablet Commonly known as: SEROQUEL Take 1 tablet (100 mg total) by mouth at bedtime.        Meds ordered this encounter  Medications   fenofibrate 160 MG tablet    Sig: Take 1 tablet (160 mg total) by mouth daily. For cholesterol and triglyceride    Dispense:  90 tablet    Refill:  3   pantoprazole (PROTONIX) 40 MG tablet    Sig: Take 1 tablet (40 mg total) by mouth daily.    Dispense:  90 tablet    Refill:  3    If potassium is low will DC his chlorthalidone & potassium and consider ACE/ARB  Follow-up: Return in about 6 months (around 07/14/2023), or if symptoms  worsen or fail to improve.  Claretta Fraise, M.D.

## 2023-01-14 LAB — CMP14+EGFR
ALT: 52 IU/L — ABNORMAL HIGH (ref 0–44)
AST: 49 IU/L — ABNORMAL HIGH (ref 0–40)
Albumin/Globulin Ratio: 1.5 (ref 1.2–2.2)
Albumin: 4.1 g/dL (ref 4.1–5.1)
Alkaline Phosphatase: 64 IU/L (ref 44–121)
BUN/Creatinine Ratio: 21 — ABNORMAL HIGH (ref 9–20)
BUN: 19 mg/dL (ref 6–24)
Bilirubin Total: 0.4 mg/dL (ref 0.0–1.2)
CO2: 25 mmol/L (ref 20–29)
Calcium: 9.7 mg/dL (ref 8.7–10.2)
Chloride: 98 mmol/L (ref 96–106)
Creatinine, Ser: 0.91 mg/dL (ref 0.76–1.27)
Globulin, Total: 2.7 g/dL (ref 1.5–4.5)
Glucose: 119 mg/dL — ABNORMAL HIGH (ref 70–99)
Potassium: 3.3 mmol/L — ABNORMAL LOW (ref 3.5–5.2)
Sodium: 139 mmol/L (ref 134–144)
Total Protein: 6.8 g/dL (ref 6.0–8.5)
eGFR: 103 mL/min/{1.73_m2} (ref >59)

## 2023-01-14 LAB — LIPID PANEL
Chol/HDL Ratio: 4.3 ratio (ref 0.0–5.0)
Cholesterol, Total: 223 mg/dL — ABNORMAL HIGH (ref 100–199)
HDL: 52 mg/dL
LDL Chol Calc (NIH): 114 mg/dL — ABNORMAL HIGH (ref 0–99)
Triglycerides: 331 mg/dL — ABNORMAL HIGH (ref 0–149)
VLDL Cholesterol Cal: 57 mg/dL — ABNORMAL HIGH (ref 5–40)

## 2023-01-17 ENCOUNTER — Other Ambulatory Visit: Payer: Self-pay | Admitting: *Deleted

## 2023-01-17 DIAGNOSIS — E876 Hypokalemia: Secondary | ICD-10-CM

## 2023-01-24 ENCOUNTER — Other Ambulatory Visit: Payer: 59

## 2023-01-24 DIAGNOSIS — E876 Hypokalemia: Secondary | ICD-10-CM

## 2023-01-25 LAB — BMP8+EGFR
BUN/Creatinine Ratio: 17 (ref 9–20)
BUN: 18 mg/dL (ref 6–24)
CO2: 22 mmol/L (ref 20–29)
Calcium: 10 mg/dL (ref 8.7–10.2)
Chloride: 98 mmol/L (ref 96–106)
Creatinine, Ser: 1.04 mg/dL (ref 0.76–1.27)
Glucose: 96 mg/dL (ref 70–99)
Potassium: 4.1 mmol/L (ref 3.5–5.2)
Sodium: 141 mmol/L (ref 134–144)
eGFR: 87 mL/min/{1.73_m2} (ref >59)

## 2023-01-25 NOTE — Progress Notes (Signed)
Hello Laython,  Your lab result is normal and/or stable.Some minor variations that are not significant are commonly marked abnormal, but do not represent any medical problem for you.  Best regards, Claretta Fraise, M.D.

## 2023-01-26 ENCOUNTER — Other Ambulatory Visit: Payer: Self-pay | Admitting: Family Medicine

## 2023-01-26 ENCOUNTER — Telehealth: Payer: Self-pay | Admitting: Family Medicine

## 2023-01-26 MED ORDER — TRIAMTERENE-HCTZ 37.5-25 MG PO TABS: 1.0000 | ORAL_TABLET | Freq: Every day | ORAL | 3 refills | Status: DC

## 2023-01-26 MED ORDER — ROSUVASTATIN CALCIUM 10 MG PO TABS: 10.0000 mg | ORAL_TABLET | Freq: Every day | ORAL | 1 refills | Status: DC

## 2023-01-26 NOTE — Telephone Encounter (Signed)
DC potassium and chlorthalidone. Add Maxzide and crestor. Continue fenofibrate

## 2023-01-26 NOTE — Telephone Encounter (Signed)
Patient is aware of his lab results but is wondering if you are wanting to change any medications.  He said during his office visit you had mentioned that you may possibly  make some changes after reviewing his labs.

## 2023-01-26 NOTE — Telephone Encounter (Signed)
Patient aware.

## 2023-01-26 NOTE — Telephone Encounter (Signed)
Patient wants to talk to nurse about lab work results from 2/12 - please call back and advise.

## 2023-03-14 ENCOUNTER — Ambulatory Visit: Payer: 59

## 2023-03-14 ENCOUNTER — Ambulatory Visit: Payer: 59 | Admitting: Family Medicine

## 2023-03-18 ENCOUNTER — Ambulatory Visit (INDEPENDENT_AMBULATORY_CARE_PROVIDER_SITE_OTHER): Payer: 59 | Admitting: Nurse Practitioner

## 2023-03-18 ENCOUNTER — Encounter: Payer: Self-pay | Admitting: Nurse Practitioner

## 2023-03-18 VITALS — BP 139/67 | HR 66 | Temp 97.7°F | Resp 20 | Ht 70.0 in | Wt 252.0 lb

## 2023-03-18 DIAGNOSIS — R252 Cramp and spasm: Secondary | ICD-10-CM

## 2023-03-18 DIAGNOSIS — H00015 Hordeolum externum left lower eyelid: Secondary | ICD-10-CM | POA: Diagnosis not present

## 2023-03-18 MED ORDER — ERYTHROMYCIN 5 MG/GM OP OINT: 1.0000 | TOPICAL_OINTMENT | Freq: Every day | OPHTHALMIC | 0 refills | Status: DC

## 2023-03-18 NOTE — Progress Notes (Signed)
Subjective:    Patient ID: Charles Santos, male    DOB: December 09, 1971, 52 y.o.   MRN: [id number]   Chief Complaint: Eye red and swollen, Recheck hernia incision, and muscle aches   HPI  Patient comes in today with several complaints. - he says he use to be on potassium supplement. Dr. Darlyn Read stopped it and every since then he has been having muscle cramps. - had hernia surgery in february. He says his incision has been hurting since he has gone back to work. Hurts when he is a lot of pulling and pushing.  - stye on left lower lid. Has been there 2-3 weeks. Patient Active Problem List   Diagnosis Date Noted   Gastroesophageal reflux disease with esophagitis without hemorrhage 01/13/2023   Difficult intubation 02/23/2021   Acute delirium 02/07/2021   Elevated LFTs 02/07/2021   Lumbar radiculopathy 02/02/2021   CAP (community acquired pneumonia) 11/07/2020   Sepsis 11/07/2020   Hyponatremia 11/07/2020   Hypokalemia 11/07/2020   Foraminal stenosis of lumbar region 12/28/2019   Lumbar spondylolysis 12/28/2019   Radiculopathy of lumbosacral region 12/05/2019   Essential hypertension 09/26/2019   Chronic right-sided low back pain with right-sided sciatica 09/26/2019   Recurrent right inguinal hernia 06/26/2019   Hypertriglyceridemia 11/24/2018   Anxiety 02/16/2018   Elevated CEA    [name]    Cancer of transverse colon 09/17/2016   S/P colonoscopic polypectomy    Hematochezia    Depression 06/01/2016   Alcohol abuse, in remission 06/01/2016   Tobacco abuse 06/01/2016   Vitamin D deficiency 10/17/2015   Hyperlipidemia 10/17/2015   History of malignant neoplasm of colon without staging 09/22/2010       Review of Systems  Constitutional:  Negative for diaphoresis.  Eyes:  Negative for pain.  Respiratory:  Negative for shortness of breath.   Cardiovascular:  Negative for chest pain, palpitations and leg swelling.  Gastrointestinal:  Negative for abdominal pain.   Endocrine: Negative for polydipsia.  Skin:  Negative for rash.  Neurological:  Negative for dizziness, weakness and headaches.  Hematological:  Does not bruise/bleed easily.  All other systems reviewed and are negative.      Objective:   Physical Exam Vitals reviewed.  Constitutional:      Appearance: Normal appearance.  Eyes:     Comments: Stye inside left lower lid  Cardiovascular:     Rate and Rhythm: Normal rate and regular rhythm.     Heart sounds: Normal heart sounds.  Pulmonary:     Effort: Pulmonary effort is normal.     Breath sounds: Normal breath sounds.  Skin:    General: Skin is warm.     Comments: Surgery incisions are intact- no erythema or edema.  Neurological:     General: No focal deficit present.     Mental Status: He is alert and oriented to person, place, and time.       BP 139/67   Pulse 66   Temp 97.7 F (36.5 C) (Temporal)   Resp 20   Ht 5\' 10"  (1.778 m)   Wt 252 lb (114.3 kg)   SpO2 98%   BMI 36.16 kg/m      Assessment & Plan:   Charles Santos in today with chief complaint of Eye red and swollen, Recheck hernia incision, and muscle aches   1. Muscle cramps Will check potassium levels and decide if need sto go back on potassium - BMP8+EGFR  2. Hordeolum externum of left lower eyelid Avoid rubbing  eye Warm compresses RTO if not resolving. - erythromycin ophthalmic ointment; Place 1 Application into the left eye at bedtime.  Dispense: 3.5 g; Refill: 0    The above assessment and management plan was discussed with the patient. The patient verbalized understanding of and has agreed to the management plan. Patient is aware to call the clinic if symptoms persist or worsen. Patient is aware when to return to the clinic for a follow-up visit. Patient educated on when it is appropriate to go to the emergency department.   Mary-Margaret Daphine Deutscher, FNP

## 2023-03-18 NOTE — Patient Instructions (Signed)
Stye ?A stye, also known as a hordeolum, is a bump that forms on an eyelid. It may look like a pimple next to the eyelash. A stye can form inside the eyelid (internal stye) or outside the eyelid (external stye). A stye can cause redness, swelling, and pain on the eyelid. ?Styes are very common. Anyone can get them at any age. They usually occur in just one eye at a time, but you may have more than one in either eye. ?What are the causes? ?A stye is caused by an infection. The infection is almost always caused by bacteria called Staphylococcus aureus. This is a common type of bacteria that lives on the skin. ?An internal stye may result from an infected oil-producing gland inside the eyelid. An external stye may be caused by an infection at the base of the eyelash (hair follicle). ?What increases the risk? ?You are more likely to develop a stye if: ?You have had a stye before. ?You have any of these conditions: ?Red, itchy, inflamed eyelids (blepharitis). ?A skin condition such as seborrheic dermatitis or rosacea. ?High fat levels in your blood (lipids). ?Dry eyes. ?What are the signs or symptoms? ?The most common symptom of a stye is eyelid pain. Internal styes are more painful than external styes. Other symptoms may include: ?Painful swelling of your eyelid. ?A scratchy feeling in your eye. ?Tearing and redness of your eye. ?A pimple-like bump on the edge of the eyelid. ?Pus draining from the stye. ?How is this diagnosed? ?Your health care provider may be able to diagnose a stye just by examining your eye. The health care provider may also check to make sure: ?You do not have a fever or other signs of a more serious infection. ?The infection has not spread to other parts of your eye or areas around your eye. ?How is this treated? ?Most styes will clear up in a few days without treatment or with warm compresses applied to the area. You may need to use antibiotic drops or ointment to treat an infection. Sometimes,  steroid drops or ointment are used in addition to antibiotics. ?In some cases, your health care provider may give you a small steroid injection in the eyelid. ?If your stye does not heal with routine treatment, your health care provider may drain pus from the stye using a thin blade or needle. This may be done if the stye is large, causing a lot of pain, or affecting your vision. ?Follow these instructions at home: ?Take over-the-counter and prescription medicines only as told by your health care provider. This includes eye drops or ointments. ?If you were prescribed an antibiotic medicine, steroid medicine, or both, apply or use them as told by your health care provider. Do not stop using the medicine even if your condition improves. ?Apply a warm, wet cloth (warm compress) to your eye for 5-10 minutes, 4 to 6 times a day. ?Clean the affected eyelid as directed by your health care provider. ?Do not wear contact lenses or eye makeup until your stye has healed and your health care provider says that it is safe. ?Do not try to pop or drain the stye. ?Do not rub your eye. ?Contact a health care provider if: ?You have chills or a fever. ?Your stye does not go away after several days. ?Your stye affects your vision. ?Your eyeball becomes swollen, red, or painful. ?Get help right away if: ?You have pain when moving your eye around. ?Summary ?A stye is a bump that forms   on an eyelid. It may look like a pimple next to the eyelash. ?A stye can form inside the eyelid (internal stye) or outside the eyelid (external stye). A stye can cause redness, swelling, and pain on the eyelid. ?Your health care provider may be able to diagnose a stye just by examining your eye. ?Apply a warm, wet cloth (warm compress) to your eye for 5-10 minutes, 4 to 6 times a day. ?This information is not intended to replace advice given to you by your health care provider. Make sure you discuss any questions you have with your health care  provider. ?Document Revised: 02/04/2021 Document Reviewed: 02/04/2021 ?Elsevier Patient Education ? 2023 [employer] ? ?

## 2023-03-19 LAB — BMP8+EGFR
BUN/Creatinine Ratio: 16 (ref 9–20)
BUN: 16 mg/dL (ref 6–24)
CO2: 25 mmol/L (ref 20–29)
Calcium: 9.4 mg/dL (ref 8.7–10.2)
Chloride: 99 mmol/L (ref 96–106)
Creatinine, Ser: 1.01 mg/dL (ref 0.76–1.27)
Glucose: 88 mg/dL (ref 70–99)
Potassium: 3.8 mmol/L (ref 3.5–5.2)
Sodium: 141 mmol/L (ref 134–144)
eGFR: 91 mL/min/{1.73_m2} (ref >59)

## 2023-03-22 ENCOUNTER — Other Ambulatory Visit: Payer: Self-pay | Admitting: Family Medicine

## 2023-03-22 NOTE — Telephone Encounter (Signed)
Last office visit 01/13/23 Last refill 09/22/22, #90, 1 refill

## 2023-03-24 ENCOUNTER — Ambulatory Visit: Payer: 59 | Admitting: Family Medicine

## 2023-04-07 ENCOUNTER — Encounter: Payer: Self-pay | Admitting: Family Medicine

## 2023-04-07 ENCOUNTER — Ambulatory Visit (INDEPENDENT_AMBULATORY_CARE_PROVIDER_SITE_OTHER): Payer: 59 | Admitting: Family Medicine

## 2023-04-07 VITALS — BP 139/67 | HR 80 | Temp 97.9°F | Ht 70.0 in | Wt 254.5 lb

## 2023-04-07 DIAGNOSIS — M5412 Radiculopathy, cervical region: Secondary | ICD-10-CM

## 2023-04-07 DIAGNOSIS — H0015 Chalazion left lower eyelid: Secondary | ICD-10-CM

## 2023-04-07 MED ORDER — METHYLPREDNISOLONE ACETATE 80 MG/ML IJ SUSP
80.0000 mg | Freq: Once | INTRAMUSCULAR | Status: AC
  Administered 2023-04-07: 80 mg via INTRAMUSCULAR

## 2023-04-07 MED ORDER — PREDNISONE 20 MG PO TABS: 20.0000 mg | ORAL_TABLET | Freq: Every day | ORAL | 0 refills | Status: AC

## 2023-04-07 NOTE — Progress Notes (Signed)
   Acute Office Visit  Subjective:     Patient ID: Charles Santos, male    DOB: 1971-03-31, 52 y.o.   MRN: [id number]  Chief Complaint  Patient presents with   Extremity Weakness    HPI Patient is in today for acute neck pain x 2 weeks. Reports pain radiates from the right side of his neck down his right arm. Intermittent tingling in his right arm. He also reports weakness in this arm as well as decreased grip strength in his right hand. Denies fever or injury. Hx of pinched nerve in his neck years ago based on MRI results. He has been seeing a [facility]. He has been taking naproxen. He is established with neurosurgery. This is who ordered the MRI of his C spine.   He also reports a stye of his left lower lid. This has been present for 4-6 weeks. He has tried erythromycin ointment, warm compresses, and lid soaks without improvement. Denies changes in vision, redness, or drainage.   ROS As per HPI.      Objective:    BP 139/67   Pulse 80   Temp 97.9 F (36.6 C) (Temporal)   Ht  (1.778 m)   Wt 254 lb 8 oz (115.4 kg)   SpO2 96%   BMI 36.52 kg/m    Physical Exam Vitals and nursing note reviewed.  Constitutional:      General: He is not in acute distress.    Appearance: He is not ill-appearing, toxic-appearing or diaphoretic.  Pulmonary:     Effort: Pulmonary effort is normal. No respiratory distress.  Musculoskeletal:     Cervical back: No edema, erythema or rigidity. No pain with movement, spinous process tenderness or muscular tenderness. Normal range of motion.     Comments: Strength 4/5 in RUE, decreased grip strength in right hand. Brisk cap refill.   Skin:    General: Skin is warm and dry.  Neurological:     General: No focal deficit present.     Mental Status: He is alert and oriented to person, place, and time.     Sensory: No sensory deficit.     Gait: Gait normal.  Psychiatric:        Mood and Affect: Mood normal.        Behavior: Behavior normal.      No results found for any visits on 04/07/23.      Assessment & Plan:   Mervin was seen today for extremity weakness.  Diagnoses and all orders for this visit:  Cervical radiculopathy Steroid IM injection today in office. Start prednisone burst tomorrow. Continue naproxen and chiropractor. Follow up with neurosurgery.  -     methylPREDNISolone acetate (DEPO-MEDROL) injection 80 mg -     predniSONE (DELTASONE) 20 MG tablet; Take 1 tablet (20 mg total) by mouth daily with breakfast for 5 days. Start tomorrow.  Chalazion left lower eyelid Referral as below.  -     Ambulatory referral to Ophthalmology    Return if symptoms worsen or fail to improve.  The patient indicates understanding of these issues and agrees with the plan.   Gabriel Earing, FNP

## 2023-04-20 ENCOUNTER — Ambulatory Visit (INDEPENDENT_AMBULATORY_CARE_PROVIDER_SITE_OTHER): Payer: 59 | Admitting: Family Medicine

## 2023-04-20 ENCOUNTER — Encounter: Payer: Self-pay | Admitting: Family Medicine

## 2023-04-20 ENCOUNTER — Ambulatory Visit (INDEPENDENT_AMBULATORY_CARE_PROVIDER_SITE_OTHER): Payer: 59

## 2023-04-20 VITALS — BP 127/63 | HR 85 | Temp 98.2°F | Ht 70.0 in | Wt 243.6 lb

## 2023-04-20 DIAGNOSIS — I10 Essential (primary) hypertension: Secondary | ICD-10-CM

## 2023-04-20 DIAGNOSIS — M5412 Radiculopathy, cervical region: Secondary | ICD-10-CM

## 2023-04-20 DIAGNOSIS — E782 Mixed hyperlipidemia: Secondary | ICD-10-CM | POA: Diagnosis not present

## 2023-04-20 MED ORDER — BETAMETHASONE SOD PHOS & ACET 6 (3-3) MG/ML IJ SUSP
6.0000 mg | Freq: Once | INTRAMUSCULAR | Status: AC
  Administered 2023-04-20: 6 mg via INTRAMUSCULAR

## 2023-04-20 MED ORDER — PREDNISONE 10 MG PO TABS: ORAL_TABLET | ORAL | 0 refills | Status: DC

## 2023-04-20 MED ORDER — TIZANIDINE HCL 4 MG PO TABS: 4.0000 mg | ORAL_TABLET | Freq: Four times a day (QID) | ORAL | 1 refills | Status: DC | PRN

## 2023-04-20 NOTE — Progress Notes (Signed)
Subjective:  Patient ID: Charles Santos, male    DOB: 1971/04/02  Age: 52 y.o. MRN: [id number]  CC: Medical Management of Chronic Issues   HPI Charles Santos presents for pain radiating from neck to right shoulder blade to elbow. . Hand is weak. Onset 3 weeks ago. Grip is weak and painful to pronate and supinate.    presents for  follow-up of hypertension. Patient has no history of headache chest pain or shortness of breath or recent cough. Patient also denies symptoms of TIA such as focal numbness or weakness. Patient denies side effects from medication. States taking it regularly.   in for follow-up of elevated cholesterol. Doing well without complaints on current medication. Denies side effects of statin including myalgia and arthralgia and nausea. Currently no chest pain, shortness of breath or other cardiovascular related symptoms noted.  Patient in for follow-up of GERD. Currently asymptomatic taking  PPI daily. There is no chest pain or heartburn. No hematemesis and no melena. No dysphagia or choking. Onset is remote. Progression is stable. Complicating factors, none.      04/20/2023    3:35 PM 04/07/2023    8:45 AM 03/18/2023    3:35 PM  Depression screen PHQ 2/9  Decreased Interest 0 0 0  Down, Depressed, Hopeless 0 0 0  PHQ - 2 Score 0 0 0  Altered sleeping  0 0  Tired, decreased energy  0 0  Change in appetite  0 0  Feeling bad or failure about yourself   0 0  Trouble concentrating  0 0  Moving slowly or fidgety/restless  0 0  Suicidal thoughts  0 0  PHQ-9 Score  0 0  Difficult doing work/chores  Not difficult at all Not difficult at all      04/07/2023    8:45 AM 03/18/2023    3:35 PM 03/21/2020   12:48 PM 11/23/2018    1:59 PM  GAD 7 : Generalized Anxiety Score  Nervous, Anxious, on Edge 0 0 0 0  Control/stop worrying 0 0 0 0  Worry too much - different things 0 0 0 0  Trouble relaxing 0 0 0 1  Restless 0 0 0 0  Easily annoyed or irritable 0 0 0 1  Afraid - awful  might happen 0 0 0 0  Total GAD 7 Score 0 0 0 2  Anxiety Difficulty Not difficult at all Not difficult at all  Not difficult at all     History Charles Santos has a past medical history of Acute midline low back pain with bilateral sciatica (05/16/2020), Anxiety, Arthritis, Cancer of transverse colon (HCC) (09/17/2016), Colon cancer (HCC) (1998), Complication of anesthesia, Depression, Emphysema of lung (HCC), Family hx of colon cancer, GERD (gastroesophageal reflux disease), Headache, Hyperlipidemia, Hypertension, Pneumonia, and Vitamin D deficiency.   He has a past surgical history that includes colon resectomy (1998); Appendectomy; Laparoscopic subtotal colectomy (N/A, 09/17/2016); Proctoscopy (09/17/2016); Inguinal hernia repair; Upper gi endoscopy; polyp removal at [facility] (2017); Flexible sigmoidoscopy (N/A, 05/19/2017); Esophagogastroduodenoscopy (egd) with propofol (N/A, 05/19/2017); Colonoscopy; Transforaminal lumbar interbody fusion w/ mis 1 level (Right, 02/02/2021); Polypectomy; Sigmoidoscopy; Upper gastrointestinal endoscopy; Flexible sigmoidoscopy (N/A, 06/04/2021); Esophagogastroduodenoscopy (egd) with propofol (N/A, 06/04/2021); Flexible sigmoidoscopy (N/A, 08/12/2022); biopsy (08/12/2022); polypectomy (08/12/2022); Back surgery; and Inguinal hernia repair (Left, 01/07/2023).   His family history includes Colon cancer in his father.He reports that he quit smoking about 2 years ago. His smoking use included cigarettes. He has a 23.00 pack-year smoking history. He has never  used smokeless tobacco. He reports current alcohol use of about 63.0 standard drinks of alcohol per week. He reports that he does not use drugs.    ROS Review of Systems  Objective:  BP 127/63   Pulse 85   Temp 98.2 F (36.8 C)   Ht 5\' 10"  (1.778 m)   Wt 243 lb 9.6 oz (110.5 kg)   SpO2 96%   BMI 34.95 kg/m   BP Readings from Last 3 Encounters:  04/20/23 127/63  04/07/23 139/67  03/18/23 139/67    Wt  Readings from Last 3 Encounters:  04/20/23 243 lb 9.6 oz (110.5 kg)  04/07/23 254 lb 8 oz (115.4 kg)  03/18/23 252 lb (114.3 kg)     Physical Exam Musculoskeletal:     Comments: Diminished right grip strength compared to left. (Right handed.) Tender at superior margin of the right trapezius and over the scapula to the upper arm. Tender  for biceps palpation       Assessment & Plan:   Charles Santos was seen today for medical management of chronic issues.  Diagnoses and all orders for this visit:  Mixed hyperlipidemia -     Lipid panel  Essential hypertension -     CBC with Differential/Platelet -     CMP14+EGFR  Cervical radiculopathy -     DG Cervical Spine Complete; Future -     betamethasone acetate-betamethasone sodium phosphate (CELESTONE) injection 6 mg  Other orders -     predniSONE (DELTASONE) 10 MG tablet; Take 5 daily for 3 days followed by 4,3,2 and 1 for 3 days each. -     tiZANidine (ZANAFLEX) 4 MG tablet; Take 1 tablet (4 mg total) by mouth every 6 (six) hours as needed for muscle spasms.       I am having Charles Santos start on predniSONE and tiZANidine. I am also having him maintain his multivitamin with minerals, Fish Oil, acetaminophen, QUEtiapine, naproxen, fenofibrate, pantoprazole, rosuvastatin, triamterene-hydrochlorothiazide, erythromycin, and pregabalin. We administered betamethasone acetate-betamethasone sodium phosphate.  Allergies as of 04/20/2023       Reactions   Oxycodone Other (See Comments)   Made pt feel like a "ZOMBIE"   Adhesive [tape] Rash, Other (See Comments)   Prefers paper tape        Medication List        Accurate as of Apr 20, 2023 11:59 PM. If you have any questions, ask your nurse or doctor.          acetaminophen 500 MG tablet Commonly known as: TYLENOL Take 500-1,000 mg by mouth every 6 (six) hours as needed for moderate pain.   erythromycin ophthalmic ointment Place 1 Application into the left eye at bedtime.    fenofibrate 160 MG tablet Take 1 tablet (160 mg total) by mouth daily. For cholesterol and triglyceride   Fish Oil 1200 MG Caps Take 1,200 mg by mouth in the morning.   multivitamin with minerals Tabs tablet Take 1 tablet by mouth daily.   naproxen 500 MG tablet Commonly known as: NAPROSYN Take 1 tablet (500 mg total) by mouth 2 (two) times daily. (NEEDS TO BE SEEN BEFORE NEXT REFILL)   pantoprazole 40 MG tablet Commonly known as: PROTONIX Take 1 tablet (40 mg total) by mouth daily.   predniSONE 10 MG tablet Commonly known as: DELTASONE Take 5 daily for 3 days followed by 4,3,2 and 1 for 3 days each. Started by: Mechele Claude, MD   pregabalin 200 MG capsule Commonly known as: LYRICA TAKE  ONE CAPSULE AT BEDTIME   QUEtiapine 100 MG tablet Commonly known as: SEROQUEL Take 1 tablet (100 mg total) by mouth at bedtime.   rosuvastatin 10 MG tablet Commonly known as: Crestor Take 1 tablet (10 mg total) by mouth daily. For cholesterol   tiZANidine 4 MG tablet Commonly known as: ZANAFLEX Take 1 tablet (4 mg total) by mouth every 6 (six) hours as needed for muscle spasms. Started by: Mechele Claude, MD   triamterene-hydrochlorothiazide 37.5-25 MG tablet Commonly known as: MAXZIDE-25 Take 1 tablet by mouth daily. For blood pressure and fluid         Follow-up: Return in about 6 months (around 10/21/2023), or if symptoms worsen or fail to improve.  Mechele Claude, M.D.

## 2023-04-21 LAB — CBC WITH DIFFERENTIAL/PLATELET
Basophils Absolute: 0.1 10*3/uL (ref 0.0–0.2)
Basos: 1 %
EOS (ABSOLUTE): 0.2 10*3/uL (ref 0.0–0.4)
Eos: 3 %
Hematocrit: 47.8 % (ref 37.5–51.0)
Hemoglobin: 16.3 g/dL (ref 13.0–17.7)
Immature Grans (Abs): 0 10*3/uL (ref 0.0–0.1)
Immature Granulocytes: 1 %
Lymphocytes Absolute: 1.5 10*3/uL (ref 0.7–3.1)
Lymphs: 22 %
MCH: 32.5 pg (ref 26.6–33.0)
MCHC: 34.1 g/dL (ref 31.5–35.7)
MCV: 95 fL (ref 79–97)
Monocytes Absolute: 0.6 10*3/uL (ref 0.1–0.9)
Monocytes: 9 %
Neutrophils Absolute: 4.3 10*3/uL (ref 1.4–7.0)
Neutrophils: 64 %
Platelets: 210 10*3/uL (ref 150–450)
RBC: 5.01 x10E6/uL (ref 4.14–5.80)
RDW: 13.3 % (ref 11.6–15.4)
WBC: 6.7 10*3/uL (ref 3.4–10.8)

## 2023-04-21 LAB — LIPID PANEL
Chol/HDL Ratio: 3.1 ratio (ref 0.0–5.0)
Cholesterol, Total: 201 mg/dL — ABNORMAL HIGH (ref 100–199)
HDL: 64 mg/dL (ref >39)
LDL Chol Calc (NIH): 88 mg/dL (ref 0–99)
Triglycerides: 301 mg/dL — ABNORMAL HIGH (ref 0–149)
VLDL Cholesterol Cal: 49 mg/dL — ABNORMAL HIGH (ref 5–40)

## 2023-04-21 LAB — CMP14+EGFR
ALT: 54 IU/L — ABNORMAL HIGH (ref 0–44)
AST: 69 IU/L — ABNORMAL HIGH (ref 0–40)
Albumin/Globulin Ratio: 1.5 (ref 1.2–2.2)
Albumin: 4.4 g/dL (ref 4.1–5.1)
Alkaline Phosphatase: 75 IU/L (ref 44–121)
BUN/Creatinine Ratio: 22 — ABNORMAL HIGH (ref 9–20)
BUN: 22 mg/dL (ref 6–24)
Bilirubin Total: 0.6 mg/dL (ref 0.0–1.2)
CO2: 22 mmol/L (ref 20–29)
Calcium: 9.6 mg/dL (ref 8.7–10.2)
Chloride: 99 mmol/L (ref 96–106)
Creatinine, Ser: 1.01 mg/dL (ref 0.76–1.27)
Globulin, Total: 2.9 g/dL (ref 1.5–4.5)
Glucose: 99 mg/dL (ref 70–99)
Potassium: 3.7 mmol/L (ref 3.5–5.2)
Sodium: 141 mmol/L (ref 134–144)
Total Protein: 7.3 g/dL (ref 6.0–8.5)
eGFR: 91 mL/min/{1.73_m2} (ref >59)

## 2023-04-22 ENCOUNTER — Encounter: Payer: Self-pay | Admitting: Family Medicine

## 2023-05-19 ENCOUNTER — Telehealth: Payer: Self-pay | Admitting: Family Medicine

## 2023-05-19 ENCOUNTER — Other Ambulatory Visit: Payer: Self-pay | Admitting: Physician Assistant

## 2023-05-19 DIAGNOSIS — M4802 Spinal stenosis, cervical region: Secondary | ICD-10-CM

## 2023-05-19 NOTE — Telephone Encounter (Signed)
DISREGARD THIS MESSAGE.  Pt called back and said that they were able to order the MRI for him.

## 2023-05-19 NOTE — Telephone Encounter (Signed)
Pt called stating that he was advised by [facility] to call PCP and get PCP to order MRI for him. Pt wants to know if Dr Darlyn Read is able to do this and if insurance will pay?

## 2023-06-29 ENCOUNTER — Ambulatory Visit
Admission: RE | Admit: 2023-06-29 | Discharge: 2023-06-29 | Disposition: A | Discharge status: Home / Self Care | Payer: 59 | Source: Ambulatory Visit | Attending: Physician Assistant | Admitting: Physician Assistant

## 2023-06-29 DIAGNOSIS — M4802 Spinal stenosis, cervical region: Secondary | ICD-10-CM

## 2023-07-05 ENCOUNTER — Other Ambulatory Visit: Payer: Self-pay | Admitting: Family Medicine

## 2023-08-16 ENCOUNTER — Ambulatory Visit (INDEPENDENT_AMBULATORY_CARE_PROVIDER_SITE_OTHER): Payer: 59 | Admitting: Orthopedic Surgery

## 2023-08-16 ENCOUNTER — Encounter: Payer: Self-pay | Admitting: Orthopedic Surgery

## 2023-08-16 VITALS — BP 149/78 | HR 72 | Ht 70.0 in | Wt 243.0 lb

## 2023-08-16 DIAGNOSIS — R202 Paresthesia of skin: Secondary | ICD-10-CM

## 2023-08-16 NOTE — Progress Notes (Signed)
New Patient Visit  Assessment: Charles Santos is a 52 y.o. male with the following: 1. Paresthesia of skin  Plan: Jacquis Ooley has numbness, tingling and radiating pains into the right hand.  He also has atrophy of the first webspace.  He also has pathology in the cervical spine.   He has positive Tinel's at the cubital tunnel, as well as the carpal tunnel.  However, most of his symptoms are to the fourth and fifth digits.  I think is important to obtain an EMG to further evaluate the right upper extremity.  There could be contributions from both the cervical spine, as well as compression peripherally.  Hopefully will obtain important information through the EMG.  Follow-up: Return for After EMG.  Subjective:  Chief Complaint  Patient presents with   Hand Pain    Numbness and tingling in the middle to pinky finger for 4 mos. Pain goes     History of Present Illness: Charles Santos is a 52 y.o. male who has been referred by Autumn [name], MD for evaluation of hand pain.  He is right-hand dominant.  He says progressively worsening symptoms of the right hand for months.  He has been evaluated by a neurosurgeon for cervical stenosis.  More recently, he has been having radiating pain into the right hand, with numbness and tingling to the fourth and fifth digits.  He notes some difficulty with grip strength.  He has difficulty with some tasks at work.  He states that his fourth and fifth digits feel swollen.  He also has some reported numbness in the forearm.   Review of Systems: No fevers or chills No numbness or tingling No chest pain No shortness of breath No bowel or bladder dysfunction No GI distress No headaches   Medical History:  Past Medical History:  Diagnosis Date   Acute midline low back pain with bilateral sciatica 05/16/2020   Anxiety    Arthritis    Cancer of transverse colon (HCC) 09/17/2016   surgery done   Colon cancer [facility]) 1998   surgery and chemo    Complication of anesthesia    not fully under for polyp removal at [facility] 2017   Depression    Emphysema of lung (HCC)    Family hx of colon cancer    GERD (gastroesophageal reflux disease)    none recent   Headache    Hyperlipidemia    Hypertension    Pneumonia    Vitamin D deficiency     Past Surgical History:  Procedure Laterality Date   APPENDECTOMY     BACK SURGERY     L4-L5 minimally invasive transforaminal lumbar interbody fusion with bilateral L4-L5 pedicle screw placement   BIOPSY  08/12/2022   Procedure: BIOPSY;  Surgeon: Lemar Lofty., MD;  Location: [facility];  Service: Gastroenterology;;   colon resectomy  1998   2 degree herida   COLONOSCOPY     ESOPHAGOGASTRODUODENOSCOPY (EGD) WITH PROPOFOL N/A 05/19/2017   Procedure: ESOPHAGOGASTRODUODENOSCOPY (EGD) WITH PROPOFOL;  Surgeon: Rachael Fee, MD;  Location: [facility];  Service: Endoscopy;  Laterality: N/A;   ESOPHAGOGASTRODUODENOSCOPY (EGD) WITH PROPOFOL N/A 06/04/2021   Procedure: ESOPHAGOGASTRODUODENOSCOPY (EGD) WITH PROPOFOL;  Surgeon: Rachael Fee, MD;  Location: [facility];  Service: Endoscopy;  Laterality: N/A;   FLEXIBLE SIGMOIDOSCOPY N/A 05/19/2017   Procedure: FLEXIBLE SIGMOIDOSCOPY;  Surgeon: Rachael Fee, MD;  Location: [facility];  Service: Endoscopy;  Laterality: N/A;   FLEXIBLE SIGMOIDOSCOPY N/A 06/04/2021   Procedure:  FLEXIBLE SIGMOIDOSCOPY;  Surgeon: Rachael Fee, MD;  Location: [facility];  Service: Endoscopy;  Laterality: N/A;   FLEXIBLE SIGMOIDOSCOPY N/A 08/12/2022   Procedure: FLEXIBLE SIGMOIDOSCOPY;  Surgeon: Meridee Score Netty Starring., MD;  Location: [facility];  Service: Gastroenterology;  Laterality: N/A;   INGUINAL HERNIA REPAIR     INGUINAL HERNIA REPAIR Left 01/07/2023   Procedure: OPEN LEFT INGUINAL HERNIA REPAIR;  Surgeon: Berna Bue, MD;  Location: [facility];  Service: General;  Laterality: Left;   LAPAROSCOPIC SUBTOTAL COLECTOMY N/A 09/17/2016    Procedure: DIAGNOSTIC LAPAROSCOPY EXPLORATORY LAPAROTOMY SUBTOTAL COLECTOMY PROCTOSCOPY;  Surgeon: Claud Kelp, MD;  Location: [facility];  Service: General;  Laterality: N/A;   polyp removal at [facility]  2017   POLYPECTOMY     POLYPECTOMY  08/12/2022   Procedure: POLYPECTOMY;  Surgeon: Lemar Lofty., MD;  Location: [facility];  Service: Gastroenterology;;   PROCTOSCOPY  09/17/2016   Procedure: PROCTOSCOPY;  Surgeon: Claud Kelp, MD;  Location: [facility];  Service: General;;   SIGMOIDOSCOPY     TRANSFORAMINAL LUMBAR INTERBODY FUSION W/ MIS 1 LEVEL Right 02/02/2021   Procedure: Right Lumbar four -lumbar five minimally invasive transforaminal lumbar interbody fusion;  Surgeon: Jadene Pierini, MD;  Location: [facility];  Service: Neurosurgery;  Laterality: Right;   UPPER GASTROINTESTINAL ENDOSCOPY     UPPER GI ENDOSCOPY      Family History  Problem Relation Age of Onset   Colon cancer Father        pt thinks he was in his 43's when dx   Esophageal cancer Neg Hx    Rectal cancer Neg Hx    Stomach cancer Neg Hx    Prostate cancer Neg Hx    Colon polyps Neg Hx    Social History   Tobacco Use   Smoking status: Former    Current packs/day: 0.00    Average packs/day: 1 pack/day for 23.0 years (23.0 ttl pk-yrs)    Types: Cigarettes    Start date: 10/07/1997    Quit date: 10/07/2020    Years since quitting: 2.8   Smokeless tobacco: Never  Vaping Use   Vaping status: Never Used  Substance Use Topics   Alcohol use: Yes    Alcohol/week: 63.0 standard drinks of alcohol    Types: 63 Cans of beer per week   Drug use: Never    Allergies  Allergen Reactions   Oxycodone Other (See Comments)    Made pt feel like a "ZOMBIE"   Adhesive [Tape] Rash and Other (See Comments)    Prefers paper tape    Current Meds  Medication Sig   fenofibrate 160 MG tablet Take 1 tablet (160 mg total) by mouth daily. For cholesterol and triglyceride   Multiple Vitamin (MULTIVITAMIN WITH  MINERALS) TABS tablet Take 1 tablet by mouth daily.   naproxen (NAPROSYN) 500 MG tablet Take 1 tablet (500 mg total) by mouth 2 (two) times daily. (NEEDS TO BE SEEN BEFORE NEXT REFILL)   Omega-3 Fatty Acids (FISH OIL) 1200 MG CAPS Take 1,200 mg by mouth in the morning.   pantoprazole (PROTONIX) 40 MG tablet Take 1 tablet (40 mg total) by mouth daily.   pregabalin (LYRICA) 200 MG capsule TAKE ONE CAPSULE AT BEDTIME   rosuvastatin (CRESTOR) 10 MG tablet TAKE ONE TABLET DAILY FOR CHOLESTEROL   triamterene-hydrochlorothiazide (MAXZIDE-25) 37.5-25 MG tablet Take 1 tablet by mouth daily. For blood pressure and fluid    Objective: BP (!) 149/78   Pulse 72  Ht 5\' 10"  (1.778 m)   Wt 243 lb (110.2 kg)   BMI 34.87 kg/m   Physical Exam:  General: Alert and oriented. and No acute distress. Gait: Normal gait.  Evaluation the right hand demonstrates atrophy of the dorsal first webspace.  Decree sensation to the fifth and fourth digits.  Positive Tinel's of the carpal tunnel, with radiating pain into the long finger.  Positive Tinel's at the cubital tunnel.  Positive Phalen's at both the carpal and the cubital tunnel.  Fingers are warm and well-perfused.  2+ radial pulse.   IMAGING: No new imaging obtained today   New Medications:  No orders of the defined types were placed in this encounter.     Oliver Barre, MD  08/16/2023 1:28 PM

## 2023-09-02 ENCOUNTER — Ambulatory Visit (INDEPENDENT_AMBULATORY_CARE_PROVIDER_SITE_OTHER): Payer: 59 | Admitting: Physical Medicine and Rehabilitation

## 2023-09-02 DIAGNOSIS — R202 Paresthesia of skin: Secondary | ICD-10-CM

## 2023-09-02 DIAGNOSIS — M79641 Pain in right hand: Secondary | ICD-10-CM

## 2023-09-02 DIAGNOSIS — M4802 Spinal stenosis, cervical region: Secondary | ICD-10-CM | POA: Insufficient documentation

## 2023-09-02 DIAGNOSIS — G8929 Other chronic pain: Secondary | ICD-10-CM

## 2023-09-02 DIAGNOSIS — M542 Cervicalgia: Secondary | ICD-10-CM | POA: Diagnosis not present

## 2023-09-02 DIAGNOSIS — M25511 Pain in right shoulder: Secondary | ICD-10-CM | POA: Diagnosis not present

## 2023-09-02 DIAGNOSIS — R531 Weakness: Secondary | ICD-10-CM

## 2023-09-02 DIAGNOSIS — M5010 Cervical disc disorder with radiculopathy, unspecified cervical region: Secondary | ICD-10-CM | POA: Insufficient documentation

## 2023-09-02 NOTE — Progress Notes (Unsigned)
Charles Santos - 52 y.o. male MRN [id number]  Date of birth: 03-18-1971  Office Visit Note: Visit Date: 09/02/2023 PCP: Mechele Claude, MD Referred by: Oliver Barre, MD  Subjective: Chief Complaint  Patient presents with  . Right Arm - Pain    Right arm dominant. Pain from shoulder to finger tips on right side. Says pain is now starting in his left shoulder as well.   HPI: Charles Santos is a 52 y.o. male who comes in todayHPI    ROS Otherwise per HPI.  Assessment & Plan: Visit Diagnoses:    ICD-10-CM   1. Paresthesia of skin  R20.2 NCV with EMG (electromyography)       Plan: No additional findings.   Meds & Orders: No orders of the defined types were placed in this encounter.   Orders Placed This Encounter  Procedures  . NCV with EMG (electromyography)    Follow-up: No follow-ups on file.   Procedures: No procedures performed      Clinical History: MRI CERVICAL SPINE WITHOUT CONTRAST   TECHNIQUE: Multiplanar, multisequence MR imaging of the cervical spine was performed. No intravenous contrast was administered.   COMPARISON:  Prior radiograph from 04/20/2023 and MRI from 11/25/2020.   FINDINGS: Alignment: Dextroscoliosis. Straightening with slight reversal of the normal cervical lordosis, with trace degenerative anterolisthesis of C3 on C4. Appearance is stable.   Vertebrae: Chronic height loss at the superior endplate of C5, stable. Vertebral body height otherwise maintained. Bone marrow signal intensity diffusely heterogeneous. No worrisome osseous lesions. Reactive marrow edema present about the left C2-3 facet due to facet arthritis (series 107, image 14). Scattered reactive endplate changes noted at C3-4 through C6-7.   Cord: Normal signal and morphology.   Posterior Fossa, vertebral arteries, paraspinal tissues: Visualized brain and posterior fossa within normal limits. Craniocervical junction normal. Paraspinous soft tissues demonstrate no  acute finding. Incidental probable small sebaceous cyst noted at the left upper posterior neck. Normal flow voids seen within the vertebral arteries bilaterally.   Disc levels:   C2-C3: Negative interspace. Moderate left with mild right facet arthrosis. No spinal stenosis. Mild to moderate left C3 foraminal narrowing. Right neural foramen remains patent. Appearance is similar.   C3-C4: Trace anterolisthesis. Diffuse left eccentric disc bulge with bilateral uncovertebral spurring. Severe left with mild right facet arthrosis. Resultant mild-to-moderate spinal stenosis. Severe left with moderate right C4 foraminal narrowing. Changes are mildly progressed.   C4-C5: Degenerative intervertebral disc space narrowing. Broad-based left paracentral disc osteophyte complex flattens and effaces the ventral thecal sac (series 109, image 13). Secondary cord flattening without cord signal changes. Superimposed mild facet hypertrophy. Resultant mild-to-moderate spinal stenosis. Moderate to severe left with moderate right C5 foraminal stenosis. Appearance is mildly progressed.   C5-C6: Degenerative intervertebral disc space narrowing with circumferential disc osteophyte complex. Posterior component flattens and indents the ventral thecal sac. Secondary cord flattening without cord signal changes. Moderate spinal stenosis. Severe right worse than left C6 foraminal narrowing. Appearance is mildly progressed.   C6-C7: Degenerative intervertebral disc space narrowing with circumferential disc osteophyte complex, slightly asymmetric to the right. Flattening and partial effacement of the ventral thecal sac. Mild to moderate spinal stenosis. Severe right with moderate left C7 foraminal narrowing. Appearance is mildly progressed.   C7-T1: Mild disc bulge with uncovertebral spurring. Mild bilateral facet hypertrophy. No significant spinal stenosis. Mild right C8 foraminal narrowing. Left neural  foramina remains patent.   T1-2: Mild disc bulge with uncovertebral spurring. Mild facet hypertrophy. No spinal stenosis.  Severe right foraminal narrowing. Left neural foramina remains patent.   IMPRESSION: 1. Multilevel cervical spondylosis with resultant mild to moderate diffuse spinal stenosis at C3-4 through C6-7. 2. Multifactorial degenerative changes with resultant multilevel foraminal narrowing as above. Notable findings include severe left with moderate right C4 foraminal stenosis, moderate to severe left with moderate right C5 foraminal narrowing, severe right worse than left C6 foraminal stenosis, with severe right and moderate left C7 foraminal narrowing. 3. Reactive marrow edema about the left C2-3 facet due to facet arthritis. Finding could serve as a source for neck pain. 4. Overall, these changes are mildly progressed as compared to most recent MRI from 11/25/2020.     Electronically Signed   By: Rise Mu M.D.   On: 07/03/2023 01:07   He reports that he quit smoking about 2 years ago. His smoking use included cigarettes. He started smoking about 25 years ago. He has a 23 pack-year smoking history. He has never used smokeless tobacco. No results for input(s): "HGBA1C", "LABURIC" in the last 8760 hours.  Objective:  VS:  HT:    WT:   BMI:     BP:   HR: bpm  TEMP: ( )  RESP:  Physical Exam  Ortho Exam  Imaging: No results found.  Past Medical/Family/Surgical/Social History: Medications & Allergies reviewed per EMR, new medications updated. Patient Active Problem List   Diagnosis Date Noted  . Gastroesophageal reflux disease with esophagitis without hemorrhage 01/13/2023  . Difficult intubation 02/23/2021  . Acute delirium 02/07/2021  . Elevated LFTs 02/07/2021  . Lumbar radiculopathy 02/02/2021  . CAP (community acquired pneumonia) 11/07/2020  . Sepsis (HCC) 11/07/2020  . Hyponatremia 11/07/2020  . Hypokalemia 11/07/2020  . Foraminal  stenosis of lumbar region 12/28/2019  . Lumbar spondylolysis 12/28/2019  . Radiculopathy of lumbosacral region 12/05/2019  . Essential hypertension 09/26/2019  . Chronic right-sided low back pain with right-sided sciatica 09/26/2019  . Recurrent right inguinal hernia 06/26/2019  . Hypertriglyceridemia 11/24/2018  . Anxiety 02/16/2018  . Elevated CEA   . [name]   . Cancer of transverse colon (HCC) 09/17/2016  . S/P colonoscopic polypectomy   . Hematochezia   . Depression 06/01/2016  . Alcohol abuse, in remission 06/01/2016  . Tobacco abuse 06/01/2016  . Vitamin D deficiency 10/17/2015  . Hyperlipidemia 10/17/2015  . History of malignant neoplasm of colon without staging 09/22/2010   Past Medical History:  Diagnosis Date  . Acute midline low back pain with bilateral sciatica 05/16/2020  . Anxiety   . Arthritis   . Cancer of transverse colon (HCC) 09/17/2016   surgery done  . Colon cancer [facility]) 1998   surgery and chemo  . Complication of anesthesia    not fully under for polyp removal at [facility]  [facility] 2017  . Depression   . Emphysema of lung (HCC)   . Family hx of colon cancer   . GERD (gastroesophageal reflux disease)    none recent  . Headache   . Hyperlipidemia   . Hypertension   . Pneumonia   . Vitamin D deficiency    Family History  Problem Relation Age of Onset  . Colon cancer Father        pt thinks he was in his 17's when dx  . Esophageal cancer Neg Hx   . Rectal cancer Neg Hx   . Stomach cancer Neg Hx   . Prostate cancer Neg Hx   . Colon polyps Neg Hx    Past Surgical History:  Procedure Laterality Date  . APPENDECTOMY    . BACK SURGERY     L4-L5 minimally invasive transforaminal lumbar interbody fusion with bilateral L4-L5 pedicle screw placement  . BIOPSY  08/12/2022   Procedure: BIOPSY;  Surgeon: Meridee Score Netty Starring., MD;  Location: [facility];  Service: Gastroenterology;;  . colon resectomy  1998   2 degree herida  . COLONOSCOPY    .  ESOPHAGOGASTRODUODENOSCOPY (EGD) WITH PROPOFOL N/A 05/19/2017   Procedure: ESOPHAGOGASTRODUODENOSCOPY (EGD) WITH PROPOFOL;  Surgeon: Rachael Fee, MD;  Location: [facility];  Service: Endoscopy;  Laterality: N/A;  . ESOPHAGOGASTRODUODENOSCOPY (EGD) WITH PROPOFOL N/A 06/04/2021   Procedure: ESOPHAGOGASTRODUODENOSCOPY (EGD) WITH PROPOFOL;  Surgeon: Rachael Fee, MD;  Location: [facility];  Service: Endoscopy;  Laterality: N/A;  . FLEXIBLE SIGMOIDOSCOPY N/A 05/19/2017   Procedure: FLEXIBLE SIGMOIDOSCOPY;  Surgeon: Rachael Fee, MD;  Location: [facility];  Service: Endoscopy;  Laterality: N/A;  . FLEXIBLE SIGMOIDOSCOPY N/A 06/04/2021   Procedure: FLEXIBLE SIGMOIDOSCOPY;  Surgeon: Rachael Fee, MD;  Location: [facility];  Service: Endoscopy;  Laterality: N/A;  . FLEXIBLE SIGMOIDOSCOPY N/A 08/12/2022   Procedure: FLEXIBLE SIGMOIDOSCOPY;  Surgeon: Lemar Lofty., MD;  Location: [facility];  Service: Gastroenterology;  Laterality: N/A;  . INGUINAL HERNIA REPAIR    . INGUINAL HERNIA REPAIR Left 01/07/2023   Procedure: OPEN LEFT INGUINAL HERNIA REPAIR;  Surgeon: Berna Bue, MD;  Location: [facility];  Service: General;  Laterality: Left;  . LAPAROSCOPIC SUBTOTAL COLECTOMY N/A 09/17/2016   Procedure: DIAGNOSTIC LAPAROSCOPY EXPLORATORY LAPAROTOMY SUBTOTAL COLECTOMY PROCTOSCOPY;  Surgeon: Claud Kelp, MD;  Location: [facility];  Service: General;  Laterality: N/A;  . polyp removal at [facility]  2017  . POLYPECTOMY    . POLYPECTOMY  08/12/2022   Procedure: POLYPECTOMY;  Surgeon: Mansouraty, Netty Starring., MD;  Location: [facility];  Service: Gastroenterology;;  . PROCTOSCOPY  09/17/2016   Procedure: PROCTOSCOPY;  Surgeon: Claud Kelp, MD;  Location: [facility];  Service: General;;  . SIGMOIDOSCOPY    . TRANSFORAMINAL LUMBAR INTERBODY FUSION W/ MIS 1 LEVEL Right 02/02/2021   Procedure: Right Lumbar four -lumbar five minimally invasive transforaminal lumbar interbody  fusion;  Surgeon: Jadene Pierini, MD;  Location: [facility];  Service: Neurosurgery;  Laterality: Right;  . UPPER GASTROINTESTINAL ENDOSCOPY    . UPPER GI ENDOSCOPY     Social History   Occupational History  . Not on file  Tobacco Use  . Smoking status: Former    Current packs/day: 0.00    Average packs/day: 1 pack/day for 23.0 years (23.0 ttl pk-yrs)    Types: Cigarettes    Start date: 10/07/1997    Quit date: 10/07/2020    Years since quitting: 2.9  . Smokeless tobacco: Never  Vaping Use  . Vaping status: Never Used  Substance and Sexual Activity  . Alcohol use: Yes    Alcohol/week: 63.0 standard drinks of alcohol    Types: 63 Cans of beer per week  . Drug use: Never  . Sexual activity: Yes

## 2023-09-05 ENCOUNTER — Encounter: Payer: Self-pay | Admitting: Physical Medicine and Rehabilitation

## 2023-09-05 NOTE — Procedures (Unsigned)
EMG & NCV Findings: Evaluation of the right ulnar motor nerve showed reduced amplitude (2.3 mV), decreased conduction velocity (B Elbow-Wrist, 50 m/s), and decreased conduction velocity (A Elbow-B Elbow, 50 m/s).  The right median (across palm) sensory nerve showed prolonged distal peak latency (Wrist, 4.2 ms).  The right ulnar sensory nerve showed reduced amplitude (12.0 V).  All remaining nerves (as indicated in the following tables) were within normal limits.    Needle evaluation of the right first dorsal interosseous muscle showed increased insertional activity, widespread spontaneous activity, and diminished recruitment.  The right extensor digitorum communis muscle showed increased insertional activity, widespread spontaneous activity, increased motor unit amplitude, and diminished recruitment.  The right triceps muscle showed slightly increased polyphasic potentials and diminished recruitment.  All remaining muscles (as indicated in the following table) showed no evidence of electrical instability.    Impression: The above electrodiagnostic study is ABNORMAL and reveals evidence of severe acute C6, C7, and C8 radiculopathy on the right.  The clinical exam would signify more C8 damage with the atrophy noted in the FDI musculature.  He does have some slowing of the ulnar nerve but this does not drop across the elbow and there is fairly normal nerve conduction sensory through the wrist.  There is also evidence of a mild right median nerve entrapment at the wrist (carpal tunnel syndrome) affecting sensory components.   There is no significant electrodiagnostic evidence of any other focal nerve entrapment or brachial plexopathy.   Recommendations: 1.  Follow-up with referring physician. 2.  Continue current management of symptoms. 3.  Suggest MRI and surgical evaluation.  ___________________________ Naaman Plummer FAAPMR Board Certified, American Board of Physical Medicine and  Rehabilitation    Nerve Conduction Studies Anti Sensory Summary Table   Stim Site NR Peak (ms) Norm Peak (ms) P-T Amp (V) Norm P-T Amp Site1 Site2 Delta-P (ms) Dist (cm) Vel (m/s) Norm Vel (m/s)  Right Median Acr Palm Anti Sensory (2nd Digit)  31.5C  Wrist    *4.2 <3.6 13.0 >10 Wrist Palm 2.2 0.0    Palm    2.0 <2.0 5.7         Right Radial Anti Sensory (Base 1st Digit)  31.7C  Wrist    2.1 <3.1 25.5  Wrist Base 1st Digit 2.1 0.0    Right Ulnar Anti Sensory (5th Digit)  32.2C  Wrist    3.6 <3.7 *12.0 >15.0 Wrist 5th Digit 3.6 14.0 39 >38   Motor Summary Table   Stim Site NR Onset (ms) Norm Onset (ms) O-P Amp (mV) Norm O-P Amp Site1 Site2 Delta-0 (ms) Dist (cm) Vel (m/s) Norm Vel (m/s)  Right Median Motor (Abd Poll Brev)  32.2C  Wrist    3.4 <4.2 8.8 >5 Elbow Wrist 4.4 22.3 51 >50  Elbow    7.8  8.8         Right Ulnar Motor (Abd Dig Min)  32.3C  Wrist    3.6 <4.2 *2.3 >3 B Elbow Wrist 4.2 21.0 *50 >53  B Elbow    7.8  1.3  A Elbow B Elbow 2.0 10.0 *50 >53  A Elbow    9.8  1.1          EMG   Side Muscle Nerve Root Ins Act Fibs Psw Amp Dur Poly Recrt Int Dennie Bible Comment  Right 1stDorInt Ulnar C8-T1 *Incr *4+ *4+ Nml Nml 0 *Reduced Nml   Right Abd Poll Brev Median C8-T1 Nml Nml Nml Nml Nml 0 Nml Nml  Right ExtDigCom   *Incr *4+ *4+ *Incr Nml 0 *Reduced Nml   Right Triceps Radial C6-7-8 Nml Nml Nml Nml Nml *1+ *Reduced Nml   Right Deltoid Axillary C5-6 Nml Nml Nml Nml Nml 0 Nml Nml     Nerve Conduction Studies Anti Sensory Left/Right Comparison   Stim Site L Lat (ms) R Lat (ms) L-R Lat (ms) L Amp (V) R Amp (V) L-R Amp (%) Site1 Site2 L Vel (m/s) R Vel (m/s) L-R Vel (m/s)  Median Acr Palm Anti Sensory (2nd Digit)  31.5C  Wrist  *4.2   13.0  Wrist Palm     Palm  2.0   5.7        Radial Anti Sensory (Base 1st Digit)  31.7C  Wrist  2.1   25.5  Wrist Base 1st Digit     Ulnar Anti Sensory (5th Digit)  32.2C  Wrist  3.6   *12.0  Wrist 5th Digit  39    Motor Left/Right  Comparison   Stim Site L Lat (ms) R Lat (ms) L-R Lat (ms) L Amp (mV) R Amp (mV) L-R Amp (%) Site1 Site2 L Vel (m/s) R Vel (m/s) L-R Vel (m/s)  Median Motor (Abd Poll Brev)  32.2C  Wrist  3.4   8.8  Elbow Wrist  51   Elbow  7.8   8.8        Ulnar Motor (Abd Dig Min)  32.3C  Wrist  3.6   *2.3  B Elbow Wrist  *50   B Elbow  7.8   1.3  A Elbow B Elbow  *50   A Elbow  9.8   1.1           Waveforms:

## 2023-09-30 ENCOUNTER — Other Ambulatory Visit: Payer: Self-pay | Admitting: Family Medicine

## 2023-10-26 ENCOUNTER — Telehealth: Payer: Self-pay | Admitting: Physical Medicine and Rehabilitation

## 2023-10-26 NOTE — Telephone Encounter (Signed)
EMG/NCS report faxed to [facility] at [facility] [phone], ph [phone] ext [phone]

## 2023-11-01 ENCOUNTER — Ambulatory Visit: Payer: 59 | Admitting: Family Medicine

## 2023-11-02 ENCOUNTER — Encounter: Payer: Self-pay | Admitting: Family Medicine

## 2023-11-02 ENCOUNTER — Ambulatory Visit (INDEPENDENT_AMBULATORY_CARE_PROVIDER_SITE_OTHER): Payer: 59 | Admitting: Family Medicine

## 2023-11-02 VITALS — BP 130/67 | HR 76 | Temp 98.0°F | Ht 70.0 in | Wt 235.2 lb

## 2023-11-02 DIAGNOSIS — Z79899 Other long term (current) drug therapy: Secondary | ICD-10-CM | POA: Diagnosis not present

## 2023-11-02 DIAGNOSIS — R1032 Left lower quadrant pain: Secondary | ICD-10-CM | POA: Diagnosis not present

## 2023-11-02 DIAGNOSIS — M5010 Cervical disc disorder with radiculopathy, unspecified cervical region: Secondary | ICD-10-CM

## 2023-11-02 DIAGNOSIS — Z1509 Genetic susceptibility to other malignant neoplasm: Secondary | ICD-10-CM | POA: Diagnosis not present

## 2023-11-02 MED ORDER — PREGABALIN 200 MG PO CAPS: 200.0000 mg | ORAL_CAPSULE | Freq: Every day | ORAL | 1 refills | Status: DC

## 2023-11-02 NOTE — Progress Notes (Signed)
Subjective:  Patient ID: Charles Santos, male    DOB: August 01, 1971  Age: 52 y.o. MRN: [id number]  CC: Medical Management of Chronic Issues   HPI Charles Santos presents for Had NCV for possible Carpal Tunnel. Turned out to be coming from his neck.  He went back to his Neor[facility]urg to get it read but it couldn't be found. Now the MD has retired and he doesn't know who to see.   HE has been having chronic abdominal pain and has had colon cancer due to [name]. He is due for follow up soon with GI for this, but the pain is getting more severe. No NVD. Pain is left sided.     11/02/2023    3:22 PM 04/20/2023    3:35 PM 04/07/2023    8:45 AM  Depression screen PHQ 2/9  Decreased Interest 0 0 0  Down, Depressed, Hopeless 0 0 0  PHQ - 2 Score 0 0 0  Altered sleeping   0  Tired, decreased energy   0  Change in appetite   0  Feeling bad or failure about yourself    0  Trouble concentrating   0  Moving slowly or fidgety/restless   0  Suicidal thoughts   0  PHQ-9 Score   0  Difficult doing work/chores   Not difficult at all    History Charles Santos has a past medical history of Acute midline low back pain with bilateral sciatica (05/16/2020), Anxiety, Arthritis, Cancer of transverse colon (HCC) (09/17/2016), Colon cancer (HCC) (1998), Complication of anesthesia, Depression, Emphysema of lung (HCC), Family hx of colon cancer, GERD (gastroesophageal reflux disease), Headache, Hyperlipidemia, Hypertension, Pneumonia, and Vitamin D deficiency.   He has a past surgical history that includes colon resectomy (1998); Appendectomy; Laparoscopic subtotal colectomy (N/A, 09/17/2016); Proctoscopy (09/17/2016); Inguinal hernia repair; Upper gi endoscopy; polyp removal at [facility] (2017); Flexible sigmoidoscopy (N/A, 05/19/2017); Esophagogastroduodenoscopy (egd) with propofol (N/A, 05/19/2017); Colonoscopy; Transforaminal lumbar interbody fusion w/ mis 1 level (Right, 02/02/2021); Polypectomy; Sigmoidoscopy;  Upper gastrointestinal endoscopy; Flexible sigmoidoscopy (N/A, 06/04/2021); Esophagogastroduodenoscopy (egd) with propofol (N/A, 06/04/2021); Flexible sigmoidoscopy (N/A, 08/12/2022); biopsy (08/12/2022); polypectomy (08/12/2022); Back surgery; and Inguinal hernia repair (Left, 01/07/2023).   His family history includes Colon cancer in his father.He reports that he quit smoking about 3 years ago. His smoking use included cigarettes. He started smoking about 26 years ago. He has a 23 pack-year smoking history. He has never used smokeless tobacco. He reports current alcohol use of about 63.0 standard drinks of alcohol per week. He reports that he does not use drugs.    ROS Review of Systems  Constitutional:  Negative for fever.  Respiratory:  Negative for shortness of breath.   Cardiovascular:  Negative for chest pain.  Gastrointestinal:  Positive for abdominal pain. Negative for blood in stool, constipation, diarrhea, nausea, rectal pain and vomiting.  Musculoskeletal:  Positive for arthralgias (posterior neck and superior border of trapezius).  Skin:  Negative for rash.  Neurological:  Positive for weakness (right hand).    Objective:  BP 130/67   Pulse 76   Temp 98 F (36.7 C)   Ht 5\' 10"  (1.778 m)   Wt 235 lb 3.2 oz (106.7 kg)   SpO2 97%   BMI 33.75 kg/m   BP Readings from Last 3 Encounters:  11/02/23 130/67  08/16/23 (!) 149/78  04/20/23 127/63    Wt Readings from Last 3 Encounters:  11/02/23 235 lb 3.2 oz (106.7 kg)  08/16/23 243 lb (110.2  kg)  04/20/23 243 lb 9.6 oz (110.5 kg)     Physical Exam Constitutional:      General: He is not in acute distress.    Appearance: He is well-developed.  HENT:     Head: Normocephalic and atraumatic.     Right Ear: External ear normal.     Left Ear: External ear normal.     Nose: Nose normal.  Eyes:     Conjunctiva/sclera: Conjunctivae normal.     Pupils: Pupils are equal, round, and reactive to light.  Cardiovascular:      Rate and Rhythm: Normal rate and regular rhythm.     Heart sounds: Normal heart sounds. No murmur heard. Pulmonary:     Effort: Pulmonary effort is normal. No respiratory distress.     Breath sounds: Normal breath sounds. No wheezing or rales.  Abdominal:     Palpations: Abdomen is soft.     Tenderness: There is no abdominal tenderness.  Musculoskeletal:        General: Normal range of motion.     Right hand: Decreased strength. Normal sensation.     Left hand: Normal.     Cervical back: Normal range of motion and neck supple.  Skin:    General: Skin is warm and dry.  Neurological:     Mental Status: He is alert and oriented to person, place, and time.     Deep Tendon Reflexes: Reflexes are normal and symmetric.  Psychiatric:        Behavior: Behavior normal.        Thought Content: Thought content normal.        Judgment: Judgment normal.       Assessment & Plan:   Charles Santos was seen today for medical management of chronic issues.  Diagnoses and all orders for this visit:  Left lower quadrant abdominal pain -     CT ABDOMEN PELVIS W CONTRAST; Charles Santos  Controlled substance agreement signed -     ToxASSURE Select 13 (MW), Urine  [name] -     CT ABDOMEN PELVIS W CONTRAST; Charles Santos  Cervical disc disorder with radiculopathy  Other orders -     pregabalin (LYRICA) 200 MG capsule; Take 1 capsule (200 mg total) by mouth at bedtime.   He will need to pursue appointment with a new Neurosurgeon within the same practice. In the meanwhile, he should get some relief with the Lyrica.      I have discontinued [name]. I have also changed his pregabalin. Additionally, I am having him maintain his multivitamin with minerals, Fish Oil, acetaminophen, QUEtiapine, fenofibrate, pantoprazole, triamterene-hydrochlorothiazide, erythromycin, tiZANidine, rosuvastatin, albuterol, Lagevrio, and naproxen.  Allergies as of 11/02/2023       Reactions   Hydrocodone     Latex    Oxycodone Other (See Comments)   Made pt feel like a "ZOMBIE"   Adhesive [tape] Rash, Other (See Comments)   Prefers paper tape        Medication List        Accurate as of November 02, 2023  5:59 PM. If you have any questions, ask your nurse or doctor.          STOP taking these medications    predniSONE 10 MG tablet Commonly known as: DELTASONE Stopped by: Cecil Vandyke       TAKE these medications    acetaminophen 500 MG tablet Commonly known as: TYLENOL Take 500-1,000 mg by mouth every 6 (six) hours as needed for moderate  pain.   albuterol 108 (90 Base) MCG/ACT inhaler Commonly known as: VENTOLIN HFA Inhale into the lungs.   erythromycin ophthalmic ointment Place 1 Application into the left eye at bedtime.   fenofibrate 160 MG tablet Take 1 tablet (160 mg total) by mouth daily. For cholesterol and triglyceride   Fish Oil 1200 MG Caps Take 1,200 mg by mouth in the morning.   Lagevrio 200 MG Caps capsule Generic drug: molnupiravir EUA TAKE 4 CAPSULES BY MOUTH EVERY 12 HOURS FOR 5 DAYS   multivitamin with minerals Tabs tablet Take 1 tablet by mouth daily.   naproxen 500 MG tablet Commonly known as: NAPROSYN TAKE ONE TABLET TWICE DAILY   pantoprazole 40 MG tablet Commonly known as: PROTONIX Take 1 tablet (40 mg total) by mouth daily.   pregabalin 200 MG capsule Commonly known as: LYRICA Take 1 capsule (200 mg total) by mouth at bedtime.   QUEtiapine 100 MG tablet Commonly known as: SEROQUEL Take 1 tablet (100 mg total) by mouth at bedtime.   rosuvastatin 10 MG tablet Commonly known as: CRESTOR TAKE ONE TABLET DAILY FOR CHOLESTEROL   tiZANidine 4 MG tablet Commonly known as: ZANAFLEX Take 1 tablet (4 mg total) by mouth every 6 (six) hours as needed for muscle spasms.   triamterene-hydrochlorothiazide 37.5-25 MG tablet Commonly known as: MAXZIDE-25 Take 1 tablet by mouth daily. For blood pressure and fluid          Follow-up: Return in about 1 month (around 12/02/2023).  Mechele Claude, M.D.

## 2023-11-05 LAB — TOXASSURE SELECT 13 (MW), URINE

## 2023-11-25 ENCOUNTER — Ambulatory Visit (HOSPITAL_COMMUNITY)
Admission: RE | Admit: 2023-11-25 | Discharge: 2023-11-25 | Disposition: A | Discharge status: Home / Self Care | Payer: 59 | Source: Ambulatory Visit | Attending: Family Medicine | Admitting: Family Medicine

## 2023-11-25 DIAGNOSIS — Z8500 Personal history of malignant neoplasm of unspecified digestive organ: Secondary | ICD-10-CM | POA: Insufficient documentation

## 2023-11-25 DIAGNOSIS — R1032 Left lower quadrant pain: Secondary | ICD-10-CM | POA: Insufficient documentation

## 2023-11-25 DIAGNOSIS — K8020 Calculus of gallbladder without cholecystitis without obstruction: Secondary | ICD-10-CM | POA: Insufficient documentation

## 2023-11-25 DIAGNOSIS — Z9049 Acquired absence of other specified parts of digestive tract: Secondary | ICD-10-CM | POA: Diagnosis not present

## 2023-11-25 DIAGNOSIS — Z1509 Genetic susceptibility to other malignant neoplasm: Secondary | ICD-10-CM | POA: Diagnosis present

## 2023-11-25 DIAGNOSIS — I700 Atherosclerosis of aorta: Secondary | ICD-10-CM | POA: Insufficient documentation

## 2023-11-25 MED ORDER — IOHEXOL 300 MG/ML  SOLN
100.0000 mL | Freq: Once | INTRAMUSCULAR | Status: AC | PRN
  Administered 2023-11-25: 100 mL via INTRAVENOUS

## 2023-11-30 ENCOUNTER — Telehealth: Payer: Self-pay

## 2023-11-30 NOTE — Telephone Encounter (Signed)
Copied from CRM [id number]. Topic: Clinical - Lab/Test Results >> Nov 30, 2023  2:48 PM Geroge Baseman wrote: Reason for CRM: Patient is requesting a call with CT imaging results if they are complete. He stated if he doesn't answer he is at work and it is okay to leave a detailed message.

## 2023-12-12 NOTE — Telephone Encounter (Signed)
Patient notified per image result note on 12/23

## 2023-12-15 ENCOUNTER — Ambulatory Visit: Payer: Self-pay | Admitting: Family Medicine

## 2023-12-15 ENCOUNTER — Telehealth: Payer: Self-pay | Admitting: Family Medicine

## 2023-12-15 ENCOUNTER — Other Ambulatory Visit: Payer: Self-pay | Admitting: Family Medicine

## 2023-12-15 ENCOUNTER — Telehealth: Payer: Self-pay | Admitting: Gastroenterology

## 2023-12-15 MED ORDER — ROSUVASTATIN CALCIUM 10 MG PO TABS: 10.0000 mg | ORAL_TABLET | Freq: Every day | ORAL | 0 refills | Status: DC

## 2023-12-15 NOTE — Telephone Encounter (Signed)
 The pt states that he has had some BRBPR with bowel movements for the past few weeks.  He has been advised that Dr Teressa is no longer in practice.  He will check the website and call back to make an appt.  He will go to the ED or UC if the bleeding becomes heavy or independent of stool. No other complaints at this time. SABRA

## 2023-12-15 NOTE — Telephone Encounter (Signed)
 Refill sent.

## 2023-12-15 NOTE — Telephone Encounter (Signed)
 Copied from CRM [id number]. Topic: Clinical - Medication Refill >> Dec 15, 2023  1:22 PM Deleta HERO wrote: Most Recent Primary Care Visit:  Provider: ZOLLIE LOWERS  Department: [facility]  Visit Type: OFFICE VISIT  Date: 11/02/2023  Medication: rosuvastatin  (CRESTOR ) 10 MG tablet   Has the patient contacted their pharmacy? Yes, he states they have sent over authorization request to the office. (Agent: If no, request that the patient contact the pharmacy for the refill. If patient does not wish to contact the pharmacy document the reason why and proceed with request.) (Agent: If yes, when and what did the pharmacy advise?)  Is this the correct pharmacy for this prescription? Yes If no, delete pharmacy and type the correct one.  This is the patient's preferred pharmacy:  [facility] [place], [place] - [address] [place] [address] Phone: [phone] Fax: [phone]   Has the prescription been filled recently? No  Is the patient out of the medication? Yes  Has the patient been seen for an appointment in the last year OR does the patient have an upcoming appointment? Yes  Can we respond through MyChart? No  Agent: Please be advised that Rx refills may take up to 3 business days. We ask that you follow-up with your pharmacy.

## 2023-12-15 NOTE — Telephone Encounter (Signed)
 Able to get in contact with patient on second attempt. Patient stated that his issue has been resolved and he does not need anything else at this time.

## 2023-12-15 NOTE — Telephone Encounter (Signed)
 Called patient to attempt triage. Received VMB. LVM to return call. Will continue to attempt.   Copied from CRM [id number]. Topic: Clinical - Medical Advice >> Dec 15, 2023  1:25 PM Deleta HERO wrote: Reason for CRM: The patient wanted to determine if PCP was in office to inform him of the [facility] in [place] stating that its too early for a colonoscopy. The patient is concerned and states there is blood in his stool, he wants confirmation by Dr. Zollie if a colonoscopy should be completed or not.   Callback #: [phone]

## 2023-12-15 NOTE — Telephone Encounter (Signed)
 Inbound call from patient stating he has been passing blood for the past 2 weeks. Requesting to discuss scheduling of recall colonoscopy at the hospital. Please advise, thank you.

## 2024-01-04 ENCOUNTER — Encounter: Payer: Self-pay | Admitting: Gastroenterology

## 2024-01-04 ENCOUNTER — Telehealth: Payer: Self-pay

## 2024-01-04 ENCOUNTER — Other Ambulatory Visit: Payer: Self-pay | Admitting: Family Medicine

## 2024-01-04 ENCOUNTER — Ambulatory Visit: Payer: 59 | Admitting: Gastroenterology

## 2024-01-04 ENCOUNTER — Other Ambulatory Visit (INDEPENDENT_AMBULATORY_CARE_PROVIDER_SITE_OTHER): Payer: 59

## 2024-01-04 VITALS — BP 110/70 | HR 87 | Ht 70.0 in | Wt 235.0 lb

## 2024-01-04 DIAGNOSIS — R1032 Left lower quadrant pain: Secondary | ICD-10-CM

## 2024-01-04 DIAGNOSIS — Z85038 Personal history of other malignant neoplasm of large intestine: Secondary | ICD-10-CM

## 2024-01-04 DIAGNOSIS — K625 Hemorrhage of anus and rectum: Secondary | ICD-10-CM | POA: Diagnosis not present

## 2024-01-04 DIAGNOSIS — Z1509 Genetic susceptibility to other malignant neoplasm: Secondary | ICD-10-CM

## 2024-01-04 DIAGNOSIS — D132 Benign neoplasm of duodenum: Secondary | ICD-10-CM

## 2024-01-04 LAB — CBC WITH DIFFERENTIAL/PLATELET
Basophils Absolute: 0 10*3/uL (ref 0.0–0.1)
Basophils Relative: 0.6 % (ref 0.0–3.0)
Eosinophils Absolute: 0.2 10*3/uL (ref 0.0–0.7)
Eosinophils Relative: 4.7 % (ref 0.0–5.0)
HCT: 47.7 % (ref 39.0–52.0)
Hemoglobin: 16.3 g/dL (ref 13.0–17.0)
Lymphocytes Relative: 24.6 % (ref 12.0–46.0)
Lymphs Abs: 1.2 10*3/uL (ref 0.7–4.0)
MCHC: 34.3 g/dL (ref 30.0–36.0)
MCV: 95 fL (ref 78.0–100.0)
Monocytes Absolute: 0.6 10*3/uL (ref 0.1–1.0)
Monocytes Relative: 11.7 % (ref 3.0–12.0)
Neutro Abs: 2.9 10*3/uL (ref 1.4–7.7)
Neutrophils Relative %: 58.4 % (ref 43.0–77.0)
Platelets: 229 10*3/uL (ref 150.0–400.0)
RBC: 5.02 Mil/uL (ref 4.22–5.81)
RDW: 13.6 % (ref 11.5–15.5)
WBC: 4.9 10*3/uL (ref 4.0–10.5)

## 2024-01-04 LAB — COMPREHENSIVE METABOLIC PANEL
ALT: 50 U/L (ref 0–53)
AST: 49 U/L — ABNORMAL HIGH (ref 0–37)
Albumin: 4.4 g/dL (ref 3.5–5.2)
Alkaline Phosphatase: 51 U/L (ref 39–117)
BUN: 19 mg/dL (ref 6–23)
CO2: 27 meq/L (ref 19–32)
Calcium: 9.5 mg/dL (ref 8.4–10.5)
Chloride: 101 meq/L (ref 96–112)
Creatinine, Ser: 0.97 mg/dL (ref 0.40–1.50)
GFR: 90.42 mL/min (ref >60.00)
Glucose, Bld: 106 mg/dL — ABNORMAL HIGH (ref 70–99)
Potassium: 3.6 meq/L (ref 3.5–5.1)
Sodium: 138 meq/L (ref 135–145)
Total Bilirubin: 0.5 mg/dL (ref 0.2–1.2)
Total Protein: 7.6 g/dL (ref 6.0–8.3)

## 2024-01-04 NOTE — Telephone Encounter (Signed)
One of the liver enzymes was slightly elevated. Not enough to cause concern at all. This one usually has to be 3 times normal to be significant

## 2024-01-04 NOTE — Patient Instructions (Addendum)
Your provider has requested that you go to the basement level for lab work before leaving today. Press "B" on the elevator. The lab is located at the first door on the left as you exit the elevator.  _______________________________________________________  If your blood pressure at your visit was 140/90 or greater, please contact your primary care physician to follow up on this.  _______________________________________________________  If you are age 52 or older, your body mass index should be between 23-30. Your Body mass index is 33.72 kg/m. If this is out of the aforementioned range listed, please consider follow up with your Primary Care Provider.  If you are age 63 or younger, your body mass index should be between 19-25. Your Body mass index is 33.72 kg/m. If this is out of the aformentioned range listed, please consider follow up with your Primary Care Provider.   ________________________________________________________  The [facility] providers would like to encourage you to use [facility] to communicate with providers for non-urgent requests or questions.  Due to long hold times on the telephone, sending your provider a message by [facility] may be a faster and more efficient way to get a response.  Please allow 48 business hours for a response.  Please remember that this is for non-urgent requests.  _______________________________________________________   Due to recent changes in healthcare laws, you may see the results of your imaging and laboratory studies on MyChart before your provider has had a chance to review them.  We understand that in some cases there may be results that are confusing or concerning to you. Not all laboratory results come back in the same time frame and the provider may be waiting for multiple results in order to interpret others.  Please give [place] 48 hours in order for your provider to thoroughly review all the results before contacting the office for clarification of  your results.    Thank you for entrusting me with your care and choosing [facility].  Bayley Leanna Sato, PA-C

## 2024-01-04 NOTE — Progress Notes (Signed)
Chief Complaint: Rectal bleeding Primary GI MD: Dr. Christella Hartigan --> Dr. 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  Patient was last seen May 2022 by Dr. Christella Hartigan. Patient has genetically proven [name].  He had an EGD in [facility] 12/2016 for EMR of duodenal adenoma (2.5 cm, no HGD).  He does have a history of colon cancer diagnosed at age 36 and then he was diagnosed with adenocarcinoma [place] of the transverse colon in 2017 stage II (T3, N0) s/p subtotal colectomy October 2017.  Patient has had multiple procedures (see below) and he has a documented difficult airway intubation requiring his procedures to be done in a hospital setting.  Patient's last flexible sigmoidoscopy August 2023 showed 3 mm hyperplastic polyp in rectum.  Nonbleeding internal and external hemorrhoids.  Congestion around anastomosis with negative biopsies.  Repeat 1 year.  Last EGD June 2022 was normal with a repeat of 3 years.  Patient states a few months ago he had a period where he had LLQ pain and bloody diarrhea.  This lasted 3 to 4 days and has since resolved.  When he called to make an appointment at that time he was told he is late on getting his flexible sigmoidoscopy as he was due August 2024.  States today he is doing well.  He typically has loose stools which is his baseline since his surgery.  Has not had LLQ pain and bloody diarrhea in a few months.  He does note this has happened to him before many years ago.  He does have a history of internal hemorrhoids.  Patient declines rectal exam today.  PREVIOUS GI WORKUP   EGD 05/2017: Minor gastritis, [facility] EMR duodenal site looked normal, repeat 2 years  Flexible sigmoidoscopy 05/2017 with Dr. Christella Hartigan: Normal IC anastomosis at 20 cm  Flexible sigmoidoscopy October 2019: Entire colon normal to the  ileocolonic anastomosis, about 25 cm from anus  EGD October 2020: Moderate nonspecific pan gastritis.  Small 1 to 2 mm discrete sessile polyp removed at the site of the 2018 duodenal adenoma EMR, surrounding mucosa of the region was nonspecifically edematous/erythematous.  Biopsies taken.  Pathology showed no sign of neoplasm  Flexible sigmoidoscopy 09/2019: Ileocolonic anastomosis was normal appearing, located 25 cm from anus.  No polyps.  Repeat 1 year  EGD 06/04/2021: Normal upper GI tract, repeat 3 years  Flexible sigmoidoscopy 06/04/2021: Normal ileocolonic anastomosis at 25 cm from anal verge.  No polyps or cancers.  Repeat 1 year  Flexible sigmoidoscopy 08/12/2022 with Dr. Meridee Score:  - Hemorrhoids found on rectal exam - patent end-to-end ileocolonic anastomosis characterized at anastomosis by congestion and erosion which was biopsied (negative for neoplasm).   - Neoterminal ileum normal.   - One 3 mm polyp (hyperplastic) in rectum, removed with cold snare.  Resected and retrieved.   - Normal mucosa in colon otherwise.  -  Nonbleeding nonthrombosed internal hemorrhoids.  - Repeat 1 year  Past Medical History:  Diagnosis Date   Acute midline low back pain with bilateral sciatica 05/16/2020   Anxiety    Arthritis    Cancer of transverse colon (HCC) 09/17/2016   surgery done   Colon cancer [facility]) 1998   surgery and chemo   Complication of anesthesia    not fully under for polyp removal at [facility] 2017   Depression  Emphysema of lung (HCC)    Family hx of colon cancer    GERD (gastroesophageal reflux disease)    none recent   Headache    Hernia, inguinal    Left   Hyperlipidemia    Hypertension    Pneumonia    Vitamin D deficiency     Past Surgical History:  Procedure Laterality Date   APPENDECTOMY     BACK SURGERY     L4-L5 minimally invasive transforaminal lumbar interbody fusion with bilateral L4-L5 pedicle screw placement   BIOPSY  08/12/2022   Procedure:  BIOPSY;  Surgeon: Lemar Lofty., MD;  Location: [facility];  Service: Gastroenterology;;   colon resectomy  1998   2 degree herida   COLONOSCOPY     ESOPHAGOGASTRODUODENOSCOPY (EGD) WITH PROPOFOL N/A 05/19/2017   Procedure: ESOPHAGOGASTRODUODENOSCOPY (EGD) WITH PROPOFOL;  Surgeon: Rachael Fee, MD;  Location: [facility];  Service: Endoscopy;  Laterality: N/A;   ESOPHAGOGASTRODUODENOSCOPY (EGD) WITH PROPOFOL N/A 06/04/2021   Procedure: ESOPHAGOGASTRODUODENOSCOPY (EGD) WITH PROPOFOL;  Surgeon: Rachael Fee, MD;  [facility]: [facility];  Service: Endoscopy;  Laterality: N/A;   FLEXIBLE SIGMOIDOSCOPY N/A 05/19/2017   Procedure: FLEXIBLE SIGMOIDOSCOPY;  Surgeon: Rachael Fee, MD;  [facility]: [facility];  Service: Endoscopy;  Laterality: N/A;   FLEXIBLE SIGMOIDOSCOPY N/A 06/04/2021   Procedure: FLEXIBLE SIGMOIDOSCOPY;  Surgeon: Rachael Fee, MD;  [facility]: [facility];  Service: Endoscopy;  Laterality: N/A;   FLEXIBLE SIGMOIDOSCOPY N/A 08/12/2022   Procedure: FLEXIBLE SIGMOIDOSCOPY;  Surgeon: Meridee Score Netty Starring., MD;  Location: [facility];  Service: Gastroenterology;  Laterality: N/A;   INGUINAL HERNIA REPAIR     INGUINAL HERNIA REPAIR Left 01/07/2023   Procedure: OPEN LEFT INGUINAL HERNIA REPAIR;  Surgeon: Berna Bue, MD;  Location: [facility];  Service: General;  Laterality: Left;   LAPAROSCOPIC SUBTOTAL COLECTOMY N/A 09/17/2016   Procedure: DIAGNOSTIC LAPAROSCOPY EXPLORATORY LAPAROTOMY SUBTOTAL COLECTOMY PROCTOSCOPY;  Surgeon: Claud Kelp, MD;  Location: [facility];  Service: General;  Laterality: N/A;   polyp removal at [facility]  2017   POLYPECTOMY     POLYPECTOMY  08/12/2022   Procedure: POLYPECTOMY;  Surgeon: Lemar Lofty., MD;  Location: [facility];  Service: Gastroenterology;;   PROCTOSCOPY  09/17/2016   Procedure: PROCTOSCOPY;  Surgeon: Claud Kelp, MD;  Location: [facility];  Service: General;;   SIGMOIDOSCOPY     TRANSFORAMINAL  LUMBAR INTERBODY FUSION W/ MIS 1 LEVEL Right 02/02/2021   Procedure: Right Lumbar four -lumbar five minimally invasive transforaminal lumbar interbody fusion;  Surgeon: Jadene Pierini, MD;  Location: [facility];  Service: Neurosurgery;  Laterality: Right;   UPPER GASTROINTESTINAL ENDOSCOPY     UPPER GI ENDOSCOPY      Current Outpatient Medications  Medication Sig Dispense Refill   fenofibrate 160 MG tablet Take 1 tablet (160 mg total) by mouth daily. For cholesterol and triglyceride 90 tablet 3   Multiple Vitamin (MULTIVITAMIN WITH MINERALS) TABS tablet Take 1 tablet by mouth daily.     naproxen (NAPROSYN) 500 MG tablet TAKE ONE TABLET TWICE DAILY 180 tablet 0   Omega-3 Fatty Acids (FISH OIL) 1200 MG CAPS Take 1,200 mg by mouth in the morning.     pantoprazole (PROTONIX) 40 MG tablet Take 1 tablet (40 mg total) by mouth daily. 90 tablet 3   pregabalin (LYRICA) 200 MG capsule Take 1 capsule (200 mg total) by mouth at bedtime. 90 capsule 1   rosuvastatin (CRESTOR) 10 MG tablet Take 1 tablet (10 mg total) by mouth  daily. 90 tablet 0   triamterene-hydrochlorothiazide (MAXZIDE-25) 37.5-25 MG tablet Take 1 tablet by mouth daily. For blood pressure and fluid 90 tablet 3   acetaminophen (TYLENOL) 500 MG tablet Take 500-1,000 mg by mouth every 6 (six) hours as needed for moderate pain. (Patient not taking: Reported on 08/16/2023)     albuterol (VENTOLIN HFA) 108 (90 Base) MCG/ACT inhaler Inhale into the lungs. (Patient not taking: Reported on 01/04/2024)     LAGEVRIO 200 MG CAPS capsule TAKE 4 CAPSULES BY MOUTH EVERY 12 HOURS FOR 5 DAYS (Patient not taking: Reported on 01/04/2024)     QUEtiapine (SEROQUEL) 100 MG tablet Take 1 tablet (100 mg total) by mouth at bedtime. (Patient not taking: Reported on 08/16/2023) 90 tablet 3   tiZANidine (ZANAFLEX) 4 MG tablet Take 1 tablet (4 mg total) by mouth every 6 (six) hours as needed for muscle spasms. (Patient not taking: Reported on 01/04/2024) 30 tablet 1   No  current facility-administered medications for this visit.    Allergies as of 01/04/2024 - Review Complete 01/04/2024  Allergen Reaction Noted   Hydrocodone  05/05/2023   Latex  05/05/2023   Oxycodone Other (See Comments) 02/16/2021   Adhesive [tape] Rash and Other (See Comments) 05/16/2017    Family History  Problem Relation Age of Onset   Colon cancer Father        pt thinks he was in his 66's when dx   Esophageal cancer Neg Hx    Rectal cancer Neg Hx    Stomach cancer Neg Hx    Prostate cancer Neg Hx    Colon polyps Neg Hx     Social History   Socioeconomic History   Marital status: Divorced    Spouse name: Not on file   Number of children: 2   Years of education: Not on file   Highest education level: Not on file  Occupational History   Not on file  Tobacco Use   Smoking status: Former    Current packs/day: 0.00    Average packs/day: 1 pack/day for 23.0 years (23.0 ttl pk-yrs)    Types: Cigarettes    Start date: 10/07/1997    Quit date: 10/07/2020    Years since quitting: 3.2   Smokeless tobacco: Never  Vaping Use   Vaping status: Never Used  Substance and Sexual Activity   Alcohol use: Yes    Alcohol/week: 63.0 standard drinks of alcohol    Types: 63 Cans of beer per week   Drug use: Never   Sexual activity: Yes  Other Topics Concern   Not on file  Social History Narrative   Not on file   Social Drivers of Health   Financial Resource Strain: Not on file  Food Insecurity: Not on file  Transportation Needs: Not on file  Physical Activity: Not on file  Stress: Not on file  Social Connections: Not on file  Intimate Partner Violence: Not on file    Review of Systems:    Constitutional: No weight loss, fever, chills, weakness or fatigue HEENT: Eyes: No change in vision               Ears, Nose, Throat:  No change in hearing or congestion Skin: No rash or itching Cardiovascular: No chest pain, chest pressure or palpitations   Respiratory: No SOB or  cough Gastrointestinal: See HPI and otherwise negative Genitourinary: No dysuria or change in urinary frequency Neurological: No headache, dizziness or syncope Musculoskeletal: No new muscle or joint pain  Hematologic: No bleeding or bruising Psychiatric: No history of depression or anxiety    Physical Exam:  Vital signs: BP 110/70   Pulse 87   Ht 5\' 10"  (1.778 m)   Wt 235 lb (106.6 kg)   SpO2 95%   BMI 33.72 kg/m   Constitutional: NAD, Well developed, Well nourished, alert and cooperative Head:  Normocephalic and atraumatic. Eyes:   PEERL, EOMI. No icterus. Conjunctiva pink. Respiratory: Respirations even and unlabored. Lungs clear to auscultation bilaterally.   No wheezes, crackles, or rhonchi.  Cardiovascular:  Regular rate and rhythm. No peripheral edema, cyanosis or pallor.  Gastrointestinal:  Soft, nondistended, nontender. No rebound or guarding. Normal bowel sounds. No appreciable masses or hepatomegaly. Rectal:  Not performed.  Patient declined Msk:  Symmetrical without gross deformities. Without edema, no deformity or joint abnormality.  Neurologic:  Alert and  oriented x4;  grossly normal neurologically.  Skin:   Dry and intact without significant lesions or rashes. Psychiatric: Oriented to person, place and time. Demonstrates good judgement and reason without abnormal affect or behaviors.    RELEVANT LABS AND IMAGING: CBC    Component Value Date/Time   WBC 6.7 04/20/2023 1641   WBC 4.9 01/07/2023 0730   RBC 5.01 04/20/2023 1641   RBC 4.78 01/07/2023 0730   HGB 16.3 04/20/2023 1641   HCT 47.8 04/20/2023 1641   PLT 210 04/20/2023 1641   MCV 95 04/20/2023 1641   MCH 32.5 04/20/2023 1641   MCH 32.8 01/07/2023 0730   MCHC 34.1 04/20/2023 1641   MCHC 35.2 01/07/2023 0730   RDW 13.3 04/20/2023 1641   LYMPHSABS 1.5 04/20/2023 1641   MONOABS 1.2 (H) 02/11/2021 0317   EOSABS 0.2 04/20/2023 1641   BASOSABS 0.1 04/20/2023 1641    CMP     Component Value  Date/Time   NA 141 04/20/2023 1641   K 3.7 04/20/2023 1641   CL 99 04/20/2023 1641   CO2 22 04/20/2023 1641   GLUCOSE 99 04/20/2023 1641   GLUCOSE 117 (H) 01/07/2023 0730   BUN 22 04/20/2023 1641   CREATININE 1.01 04/20/2023 1641   CALCIUM 9.6 04/20/2023 1641   PROT 7.3 04/20/2023 1641   ALBUMIN 4.4 04/20/2023 1641   AST 69 (H) 04/20/2023 1641   ALT 54 (H) 04/20/2023 1641   ALKPHOS 75 04/20/2023 1641   BILITOT 0.6 04/20/2023 1641   GFRNONAA >60 01/07/2023 0730   GFRAA 95 01/29/2021 0919     Assessment/Plan:   53 year old male history of [name], colon cancer age 30 and then 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  Patient declined rectal exam today.  He does have a history of internal hemorrhoids so hopefully that was the etiology of his bleeding as he has had it in the past.  Though he is overdue for his flexible sigmoidoscopy for his history of [name] and colon cancer s/p subtotal colectomy. - Schedule EGD/colonoscopy at the hospital - I thoroughly discussed the procedure with the patient (at bedside) to include nature of the procedure, alternatives, benefits, and risks (including but not limited to bleeding, infection, perforation, anesthesia/cardiac pulmonary complications).  Patient verbalized understanding and gave verbal consent to proceed with procedure. - If bleeding occurs between now and the  time of his procedure please let us know - CBC and CMP today  [name] Personal history of colon cancer s/p subtotal colectomy 2017 Last flexible sigmoidoscopy August 2023 with small hyperplastic polyp in rectum, internal/external hemorrhoids, and anastomosis characterized by congestion and erythema  with negative biopsies.  He is due for repeat. - Schedule EGD/colon  History of duodenal adenomatous polyp Last EGD in 2022 with a repeat of 3 years.  His due June 2025.  To decrease risk it makes the most sense to do his EGD and colonoscopy together while at the hospital - Schedule EGD at the hospital (history of difficult intubation) - I thoroughly discussed the procedure with the patient (at bedside) to include nature of the procedure, alternatives, benefits, and risks (including but not limited to bleeding, infection, perforation, anesthesia/cardiac pulmonary complications).  Patient verbalized understanding and gave verbal consent to proceed with procedure.     Lara Mulch [facility] 01/04/2024, 11:06 AM  Cc: Mechele Claude, MD

## 2024-01-04 NOTE — Telephone Encounter (Signed)
Copied from CRM [id number]. Topic: Clinical - Medical Advice >> Jan 04, 2024  1:45 PM Mosetta Putt H wrote: Reason for CRM: went to [facility] and getting set up for colonoscopy and upper endoscopy, please take a look at lab work.

## 2024-01-12 NOTE — Telephone Encounter (Signed)
Charles Santos this patient was recently seen and its under Dr. Meridee Score. Please advise patient when he can schedule.

## 2024-01-12 NOTE — Telephone Encounter (Signed)
Spoke with the pt and made him aware that he will be contacted as soon as Dr Meridee Score is back in the office and reviews the request for EGD.  The pt has been advised of the information and verbalized understanding.

## 2024-01-12 NOTE — Telephone Encounter (Signed)
He needs to tell us who he wants to schedule with since Dr Christella Hartigan has retired.  He needs an office visit set up.  Thank you

## 2024-01-12 NOTE — Telephone Encounter (Signed)
Patient called to follow up on scheduling a colonoscopy. Please advise.

## 2024-01-13 ENCOUNTER — Telehealth: Payer: Self-pay

## 2024-01-13 NOTE — Telephone Encounter (Signed)
Author: Mansouraty, Netty Starring., MD Service: Gastroenterology Author Type: Physician  Filed: 01/13/2024  6:15 AM Encounter Date: 01/04/2024 Status: Signed  Editor: Mansouraty, Netty Starring., MD (Physician)    Attending Physician's Attestation    I have reviewed the chart.    I agree with the Advanced Practitioner's note, impression, and recommendations with any updates as below. I would schedule next available using dates in the next few weeks if he agrees, or based on his availability in hospital based setting EGD/Colonoscopy     Corliss Parish, MD [facility] Gastroenterology Advanced Endoscopy Office # [phone]

## 2024-01-13 NOTE — Telephone Encounter (Signed)
-----   Message from [facility] sent at 01/13/2024  6:14 AM EST -----    ----- Message ----- From: Legrand Como, PA-C Sent: 01/04/2024  12:06 PM EST To: Lemar Lofty., MD

## 2024-01-13 NOTE — Progress Notes (Signed)
Attending Physician's Attestation   I have reviewed the chart.   I agree with the Advanced Practitioner's note, impression, and recommendations with any updates as below. I would schedule next available using dates in the next few weeks if he agrees, or based on his availability in hospital based setting EGD/Colonoscopy   Corliss Parish, MD [facility] Gastroenterology Advanced Endoscopy Office # [phone]

## 2024-01-16 ENCOUNTER — Other Ambulatory Visit: Payer: Self-pay

## 2024-01-16 DIAGNOSIS — Z1509 Genetic susceptibility to other malignant neoplasm: Secondary | ICD-10-CM

## 2024-01-16 DIAGNOSIS — Z85038 Personal history of other malignant neoplasm of large intestine: Secondary | ICD-10-CM

## 2024-01-16 DIAGNOSIS — R1032 Left lower quadrant pain: Secondary | ICD-10-CM

## 2024-01-16 DIAGNOSIS — D132 Benign neoplasm of duodenum: Secondary | ICD-10-CM

## 2024-01-16 DIAGNOSIS — K625 Hemorrhage of anus and rectum: Secondary | ICD-10-CM

## 2024-01-16 MED ORDER — SUFLAVE 178.7 G PO SOLR: 1.0000 | Freq: Once | ORAL | 0 refills | Status: AC

## 2024-01-16 NOTE — Telephone Encounter (Signed)
Endo colon has been set up for 03/01/24 at 1115 am at [facility] with GM.   Endo colon scheduled, pt instructed and medications reviewed.  Patient instructions mailed to home.  Patient to call with any questions or concerns.

## 2024-01-20 MED ORDER — SUFLAVE 178.7 G PO SOLR: 1.0000 | Freq: Once | ORAL | 0 refills | Status: AC

## 2024-01-20 NOTE — Telephone Encounter (Signed)
 Pt last saw Bayley I will send to Dottie.

## 2024-01-20 NOTE — Addendum Note (Signed)
 Addended by: Glennette Lanius on: 01/20/2024 05:07 PM   Modules accepted: Orders

## 2024-01-20 NOTE — Telephone Encounter (Signed)
 PT was told to call if any changes in his health or concern happened. He wanted to report that he has blood in his stool. Please advise.

## 2024-01-20 NOTE — Telephone Encounter (Addendum)
 Patient states that last night, he had 1 episode of a lot of dark blood from the rectum. States he stool was brown though. No further episodes. Patient denies any rectal pain. States that he does have some intermittent sharp left sided abdominal pain at times. Denies any SOB. States he does have dizziness at times but this does not occur with or following rectal bleeding.  Patient has a history of internal hemorrhoids. Also has lynch syndrome. He is scheduled for hospital endoscopy/colonoscopy with Dr Wilhelmenia on 03/01/24. Similar complaints at time of recent office visit.  I have advised patient that if he has any additional episodes of continuing large amounts rectal bleeding, he should make us  aware as we may need to get bloodwork etc. Patient verbalizes understanding.  He also requests that his Suflave  prep be sent to his local pharmacy, [facility] instead of [facility]. States [facility] will charge him 50 dollars for prep. I advised [facility] will likely charge at least 50 dollars for prep as well and patient verbalizes understanding. Prep sent to [facility] per patient request.

## 2024-01-23 NOTE — Telephone Encounter (Signed)
 Left message for patient to call back

## 2024-01-24 NOTE — Telephone Encounter (Signed)
Patient called back. He states that he has not had any additional bleeding and does not feel he needs hydrocortisone suppositories at this time.

## 2024-01-24 NOTE — Telephone Encounter (Signed)
Left additional message for patient to callback.

## 2024-01-27 ENCOUNTER — Encounter: Payer: Self-pay | Admitting: Family Medicine

## 2024-01-27 ENCOUNTER — Ambulatory Visit (INDEPENDENT_AMBULATORY_CARE_PROVIDER_SITE_OTHER): Payer: 59 | Admitting: Family Medicine

## 2024-01-27 ENCOUNTER — Ambulatory Visit: Payer: Self-pay | Admitting: Family Medicine

## 2024-01-27 VITALS — Temp 98.7°F | Ht 70.0 in | Wt 239.0 lb

## 2024-01-27 DIAGNOSIS — I10 Essential (primary) hypertension: Secondary | ICD-10-CM

## 2024-01-27 DIAGNOSIS — J069 Acute upper respiratory infection, unspecified: Secondary | ICD-10-CM | POA: Diagnosis not present

## 2024-01-27 DIAGNOSIS — R82998 Other abnormal findings in urine: Secondary | ICD-10-CM

## 2024-01-27 DIAGNOSIS — R42 Dizziness and giddiness: Secondary | ICD-10-CM

## 2024-01-27 LAB — URINALYSIS, ROUTINE W REFLEX MICROSCOPIC
Bilirubin, UA: NEGATIVE
Glucose, UA: NEGATIVE
Ketones, UA: NEGATIVE
Leukocytes,UA: NEGATIVE
Nitrite, UA: NEGATIVE
Protein,UA: NEGATIVE
RBC, UA: NEGATIVE
Specific Gravity, UA: 1.015 (ref 1.005–1.030)
Urobilinogen, Ur: 0.2 mg/dL (ref 0.2–1.0)
pH, UA: 7 (ref 5.0–7.5)

## 2024-01-27 NOTE — Telephone Encounter (Signed)
  Chief Complaint: lightheadedness when I stand Symptoms: lightheadedness, only when standing, resolves after standing for a bit Frequency: couple days Pertinent Negatives: Patient denies fever, difficulty breathing, sob, head/neck injury, CMS changes, speech changes Disposition: [] ED /[] Urgent Care (no appt availability in office) / [x] Appointment(In office/virtual)/ []  [place] [facility]/ [] Home Care/ [] Refused Recommended Disposition /[] [place] [facility]/ []  Follow-up with PCP Additional Notes: Pt states that he has about 4 vertebrae that are injured in his neck pt is wondering if that could be causing this issue. Pt states that this does not impact him when supine or sitting, only when standing. Pt sched today with provider in pcp office.

## 2024-01-27 NOTE — Progress Notes (Signed)
Acute Office Visit  Subjective:     Patient ID: Charles Santos, male    DOB: 06-02-1971, 53 y.o.   MRN: [id number]  Chief Complaint  Patient presents with   Dizziness    When standing     Dizziness This is a new problem. Episode onset: 2-3 days. The problem occurs intermittently. The problem has been waxing and waning. Associated symptoms include congestion (for a few days), coughing, fatigue, neck pain (left sided radiating down shoulder- ongoing) and urinary symptoms (dark urine. No changes in bowel or bladder control). Pertinent negatives include no abdominal pain, anorexia, chest pain, chills, diaphoresis, fever, headaches, myalgias, nausea, numbness, sore throat, vertigo, visual change, vomiting or weakness. Associated symptoms comments: Feeling lightheaded and off balance when walking. The symptoms are aggravated by walking and standing. He has tried drinking for the symptoms. The treatment provided no relief.   Hx of cervical stenosis. Established with neurosurgery  Review of Systems  Constitutional:  Positive for fatigue. Negative for chills, diaphoresis and fever.  HENT:  Positive for congestion (for a few days). Negative for sore throat.   Respiratory:  Positive for cough.   Cardiovascular:  Negative for chest pain.  Gastrointestinal:  Negative for abdominal pain, anorexia, nausea and vomiting.  Musculoskeletal:  Positive for neck pain (left sided radiating down shoulder- ongoing). Negative for myalgias.  Neurological:  Positive for dizziness. Negative for vertigo, weakness, numbness and headaches.        Objective:    BP (!) 152/90   Pulse 67   Temp 98.7 F (37.1 C)   Ht 5\' 10"  (1.778 m)   Wt 239 lb (108.4 kg)   SpO2 97%   BMI 34.29 kg/m  BP Readings from Last 3 Encounters:  01/27/24 (!) 152/90  01/04/24 110/70  11/02/23 130/67    01/27/2024    9:48 AM 9:49 AM 9:50 AM 9:52 AM    Orthostatic BP 161/90 152/90 160/91Orthostatic BP. 160/91. Data is abnormal.  Taken on 01/27/24 9:50 AM 152/90  BP Location Right Arm Right Arm Right Arm --  Patient Position Supine Sitting Standing --  Cuff Size Large Large Large --  Orthostatic Pulse 64 67 72 67  Temp -- -- -- 98.7 F (37.1 C)  Weight -- -- -- 239 lb (108.4 kg)  Height -- -- -- 5\' 10"  (1.778 m)  SpO2 97 % 97 % 96 % 97 %     Physical Exam Vitals and nursing note reviewed.  Constitutional:      General: He is not in acute distress.    Appearance: Normal appearance. He is not ill-appearing, toxic-appearing or diaphoretic.  HENT:     Right Ear: Tympanic membrane, ear canal and external ear normal.     Left Ear: Tympanic membrane, ear canal and external ear normal.     Nose: Congestion present.     Mouth/Throat:     Mouth: Mucous membranes are moist.     Pharynx: Oropharynx is clear. No oropharyngeal exudate or posterior oropharyngeal erythema.  Eyes:     General:        Right eye: No discharge.        Left eye: No discharge.     Extraocular Movements: Extraocular movements intact.     Conjunctiva/sclera: Conjunctivae normal.     Pupils: Pupils are equal, round, and reactive to light.  Cardiovascular:     Rate and Rhythm: Normal rate and regular rhythm.     Heart sounds: Normal heart sounds. No murmur heard.  Pulmonary:     Effort: Pulmonary effort is normal. No respiratory distress.     Breath sounds: Normal breath sounds. No wheezing, rhonchi or rales.  Chest:     Chest wall: No tenderness.  Abdominal:     General: Bowel sounds are normal. There is no distension.     Palpations: Abdomen is soft.     Tenderness: There is no abdominal tenderness. There is no guarding or rebound.  Musculoskeletal:     Right lower leg: No edema.     Left lower leg: No edema.  Skin:    General: Skin is warm and dry.  Neurological:     General: No focal deficit present.     Mental Status: He is alert and oriented to person, place, and time.     Cranial Nerves: No cranial nerve deficit.     Sensory:  No sensory deficit.     Motor: No weakness.     Coordination: Coordination normal.     Gait: Gait normal.  Psychiatric:        Mood and Affect: Mood normal.        Behavior: Behavior normal.     No results found for any visits on 01/27/24.      Assessment & Plan:   Saman was seen today for dizziness.  Diagnoses and all orders for this visit:  Dizziness -     CBC with Differential/Platelet -     CMP14+EGFR -     TSH  URI with cough and congestion -     COVID-19, Flu A+B and RSV  Primary hypertension  Dark urine -     Urinalysis, Routine w reflex microscopic   Dizziness started with cough and congestion. Covid, flu, RSV swab pending for possible viral etiology. Negative orthostatics today. Benign neuro exam today, no focal weakness. BP elevated. Unable to check at home. Discussed that elevated BP can cause dizziness. He declines changes in medication today and would prefer to see labs results first. Will check labs as below along with UA for dark colored urine. Will notify patient of results and plan of care pending results. Discussed when to seek emergency care.   Return for needs chronic follow up with PCP.  Gabriel Earing, FNP

## 2024-01-27 NOTE — Telephone Encounter (Signed)
Copied from CRM [id number]. Topic: Clinical - Red Word Triage >> Jan 27, 2024  9:05 AM Dimitri Ped wrote: [facility] that prompted transfer to Nurse Triage: feeling dizzy and body hurting a lot back legs and neck Reason for Disposition . [1] MODERATE dizziness (e.g., interferes with normal activities) AND [2] has NOT been evaluated by doctor (or NP/PA) for this  (Exception: Dizziness caused by heat exposure, sudden standing, or poor fluid intake.)  Answer Assessment - Initial Assessment Questions 1. DESCRIPTION: "Describe your dizziness."    When I stand I feel unsteady 2. LIGHTHEADED: "Do you feel lightheaded?" (e.g., somewhat faint, woozy, weak upon standing)     Yea I guess 3. VERTIGO: "Do you feel like either you or the room is spinning or tilting?" (i.e. vertigo)     denies 4. SEVERITY: "How bad is it?"  "Do you feel like you are going to faint?" "Can you stand and walk?"   - MILD: Feels slightly dizzy, but walking normally.   - MODERATE: Feels unsteady when walking, but not falling; interferes with normal activities (e.g., school, work).   - SEVERE: Unable to walk without falling, or requires assistance to walk without falling; feels like passing out now.      Moderate, impacts ability walking, does not impact him when sitting 5. ONSET:  "When did the dizziness begin?"     A couple days, but not all day long 6. AGGRAVATING FACTORS: "Does anything make it worse?" (e.g., standing, change in head position)     standing 8. CAUSE: "What do you think is causing the dizziness?"     Pinched nerve in neck 9. RECURRENT SYMPTOM: "Have you had dizziness before?" If Yes, ask: "When was the last time?" "What happened that time?"     denies 10. OTHER SYMPTOMS: "Do you have any other symptoms?" (e.g., fever, chest pain, vomiting, diarrhea, bleeding)       denies  Protocols used: Dizziness - Lightheadedness-A-AH

## 2024-01-27 NOTE — Patient Instructions (Signed)
Dizziness Dizziness is a common problem. It makes you feel unsteady or light-headed. You may feel like you're about to faint. Dizziness can lead to getting hurt if you stumble or fall. It's more common to feel dizzy if you're an older adult. Many things can cause you to feel dizzy. These include: Medicines. Dehydration. This is when there's not enough water in your body. Illness. Follow these instructions at home: Eating and drinking  Drink enough fluid to keep your pee (urine) pale yellow. This helps keep you from getting dehydrated. Try to drink more clear fluids, such as water. Do not drink alcohol. Try to limit how much caffeine you take in. Try to limit how much salt, also called sodium, you take in. Activity Try not to make quick movements. Stand up slowly from sitting in a chair. Steady yourself until you feel okay. In the morning, first sit up on the side of the bed. When you feel okay, hold onto something and slowly stand up. Do this until you know that your balance is okay. If you need to stand in one place for a long time, move your legs often. Tighten and relax the muscles in your legs while you're standing. Do not drive or use machines if you feel dizzy. Avoid bending down if you feel dizzy. Place items in your home so you can reach them without leaning over. Lifestyle Do not smoke, vape, or use products with nicotine or tobacco in them. If you need help quitting, talk with your health care provider. Try to lower your stress level. You can do this by using methods like yoga or meditation. Talk with your provider if you need help. General instructions Watch your dizziness for any changes. Take your medicines only as told by your provider. Talk with your provider if you think you're dizzy because of a medicine you're taking. Tell a friend or a family member that you're feeling dizzy. If they spot any changes in your behavior, have them call your provider. Contact a health care  provider if: Your dizziness doesn't go away, or you have new symptoms. Your dizziness gets worse. You feel like you may vomit. You have trouble hearing. You have a fever. You have neck pain or a stiff neck. You fall or get hurt. Get help right away if: You vomit each time you eat or drink. You have watery poop and can't eat or drink. You have trouble talking, walking, swallowing, or using your arms, hands, or legs. You feel very weak. You're bleeding. You're not thinking clearly, or you have trouble forming sentences. A friend or family member may spot this. Your vision changes, or you get a very bad headache. These symptoms may be an emergency. Call 911 right away. Do not wait to see if the symptoms will go away. Do not drive yourself to the hospital. This information is not intended to replace advice given to you by your health care provider. Make sure you discuss any questions you have with your health care provider. Document Revised: 09/01/2023 Document Reviewed: 01/13/2023 Elsevier Patient Education  2024 [employer].

## 2024-01-28 LAB — CBC WITH DIFFERENTIAL/PLATELET
Basophils Absolute: 0.1 10*3/uL (ref 0.0–0.2)
Basos: 1 %
EOS (ABSOLUTE): 0.2 10*3/uL (ref 0.0–0.4)
Eos: 4 %
Hematocrit: 46.5 % (ref 37.5–51.0)
Hemoglobin: 15.9 g/dL (ref 13.0–17.7)
Immature Grans (Abs): 0 10*3/uL (ref 0.0–0.1)
Immature Granulocytes: 0 %
Lymphocytes Absolute: 1.4 10*3/uL (ref 0.7–3.1)
Lymphs: 28 %
MCH: 33.2 pg — ABNORMAL HIGH (ref 26.6–33.0)
MCHC: 34.2 g/dL (ref 31.5–35.7)
MCV: 97 fL (ref 79–97)
Monocytes Absolute: 0.6 10*3/uL (ref 0.1–0.9)
Monocytes: 11 %
Neutrophils Absolute: 2.9 10*3/uL (ref 1.4–7.0)
Neutrophils: 56 %
Platelets: 199 10*3/uL (ref 150–450)
RBC: 4.79 x10E6/uL (ref 4.14–5.80)
RDW: 12.6 % (ref 11.6–15.4)
WBC: 5.2 10*3/uL (ref 3.4–10.8)

## 2024-01-28 LAB — CMP14+EGFR
ALT: 60 [IU]/L — ABNORMAL HIGH (ref 0–44)
AST: 75 [IU]/L — ABNORMAL HIGH (ref 0–40)
Albumin: 4.2 g/dL (ref 3.8–4.9)
Alkaline Phosphatase: 64 [IU]/L (ref 44–121)
BUN/Creatinine Ratio: 17 (ref 9–20)
BUN: 18 mg/dL (ref 6–24)
Bilirubin Total: 0.4 mg/dL (ref 0.0–1.2)
CO2: 24 mmol/L (ref 20–29)
Calcium: 9.3 mg/dL (ref 8.7–10.2)
Chloride: 102 mmol/L (ref 96–106)
Creatinine, Ser: 1.03 mg/dL (ref 0.76–1.27)
Globulin, Total: 2.6 g/dL (ref 1.5–4.5)
Glucose: 90 mg/dL (ref 70–99)
Potassium: 4.3 mmol/L (ref 3.5–5.2)
Sodium: 141 mmol/L (ref 134–144)
Total Protein: 6.8 g/dL (ref 6.0–8.5)
eGFR: 88 mL/min/{1.73_m2} (ref >59)

## 2024-01-28 LAB — TSH: TSH: 2.48 u[IU]/mL (ref 0.450–4.500)

## 2024-01-30 ENCOUNTER — Ambulatory Visit: Payer: 59 | Admitting: Family Medicine

## 2024-01-31 ENCOUNTER — Other Ambulatory Visit: Payer: Self-pay | Admitting: Family Medicine

## 2024-01-31 LAB — COVID-19, FLU A+B AND RSV
Influenza A, NAA: NOT DETECTED
Influenza B, NAA: NOT DETECTED
RSV, NAA: NOT DETECTED
SARS-CoV-2, NAA: NOT DETECTED

## 2024-02-01 ENCOUNTER — Encounter: Payer: Self-pay | Admitting: Family Medicine

## 2024-02-03 ENCOUNTER — Telehealth: Payer: Self-pay | Admitting: Gastroenterology

## 2024-02-03 NOTE — Telephone Encounter (Signed)
Patient called and stated that he is needing a different prep medication sent over to his pharmacy due to his insurance not covering the previous prep medication sent to the pharmacy.Patient also stated that he would like for the nurse to call him to go over his prep instruction with him. Patient is requesting a call back. Please advise.

## 2024-02-06 NOTE — Telephone Encounter (Signed)
 The pt has been given the number to [facility] to call for information on the prep and instructions.

## 2024-02-13 ENCOUNTER — Other Ambulatory Visit: Payer: Self-pay | Admitting: Family Medicine

## 2024-02-13 MED ORDER — ROSUVASTATIN CALCIUM 10 MG PO TABS: 10.0000 mg | ORAL_TABLET | Freq: Every day | ORAL | 0 refills | Status: DC

## 2024-02-13 MED ORDER — FENOFIBRATE 160 MG PO TABS: 160.0000 mg | ORAL_TABLET | Freq: Every day | ORAL | 3 refills | Status: AC

## 2024-02-13 NOTE — Telephone Encounter (Signed)
 Last Fill: Rosuvastatin: 12/23/23 90 tabs/0 RF      Fenofibrate:01/13/23  Last OV: 01/27/24 ACUTE Next OV: 02/23/24  Routing to provider for review/authorization.   Copied from CRM [id number]. Topic: Clinical - Medication Question >> Feb 13, 2024  3:26 PM Dondra Prader E wrote: Reason for CRM: Pt does not know the name of his cholesterol medication and is requesting a refill. Needs to know the name prior to submitting refill request   Best contact: [phone]

## 2024-02-16 ENCOUNTER — Encounter: Payer: Self-pay | Admitting: Gastroenterology

## 2024-02-23 ENCOUNTER — Encounter (HOSPITAL_COMMUNITY): Payer: Self-pay | Admitting: Gastroenterology

## 2024-02-23 ENCOUNTER — Ambulatory Visit: Payer: 59 | Admitting: Family Medicine

## 2024-02-23 NOTE — Telephone Encounter (Signed)
 Procedure:endo/colon Procedure date: 03/01/24 Procedure location: wl Arrival Time: 9:45 Spoke with the patient Y/N: y Any prep concerns? y  Has the patient obtained the prep from the pharmacy ? n Do you have a care partner and transportation: y Any additional concerns? Pt states the prep is 200 hundreds dollar. He has tried to call and has no luck.

## 2024-02-23 NOTE — Progress Notes (Signed)
 Attempted to obtain medical history for pre op call via telephone, unable to reach at this time. HIPAA compliant voicemail message left requesting return call to pre surgical testing department.

## 2024-02-24 ENCOUNTER — Encounter: Payer: Self-pay | Admitting: Family Medicine

## 2024-02-24 ENCOUNTER — Telehealth: Payer: Self-pay

## 2024-02-24 MED ORDER — POLYETHYLENE GLYCOL 3350 17 GM/SCOOP PO POWD: 238.0000 g | ORAL | 0 refills | Status: DC

## 2024-02-24 NOTE — Telephone Encounter (Signed)
 Inbound call from patient, states his insurance is not providing prep, would like to discuss miralax prep due to having a short colon, states he cannot afford the cost of the prep medication.

## 2024-02-24 NOTE — Telephone Encounter (Signed)
 Left message on machine to call back to discuss pt prep and instructions.

## 2024-02-24 NOTE — Telephone Encounter (Signed)
 Left message on machine to call back

## 2024-02-24 NOTE — Telephone Encounter (Signed)
 Spoke with the pt to discuss prep for procedure.  He states that the prep is $200 and his insurance advised that they do not cover any prep.  I did discuss options with with him and he has decided to try miralax.  I will send a prescription to the pharmacy and if it is cheaper for him to pick up OTC he will do that.  I have offered to send him the instructions in My Chart but he states he is not able to log on. I offered to walk him thru the process and he declined stating he would prefer to come and pick up instructions at the front desk on the 2 nd floor on Monday.  Instructions entered and prescription sent. I have also discussed all instructions over the phone and asked him to call me back if he has any questions.

## 2024-02-24 NOTE — Telephone Encounter (Signed)
 Patient returned call  Please advise

## 2024-03-01 ENCOUNTER — Ambulatory Visit (HOSPITAL_BASED_OUTPATIENT_CLINIC_OR_DEPARTMENT_OTHER): Admitting: Anesthesiology

## 2024-03-01 ENCOUNTER — Encounter (HOSPITAL_COMMUNITY)
Admission: RE | Disposition: A | Discharge status: Home / Self Care | Payer: Self-pay | Source: Home / Self Care | Attending: Gastroenterology

## 2024-03-01 ENCOUNTER — Encounter (HOSPITAL_COMMUNITY): Payer: Self-pay | Admitting: Gastroenterology

## 2024-03-01 ENCOUNTER — Ambulatory Visit (HOSPITAL_COMMUNITY)
Admission: RE | Admit: 2024-03-01 | Discharge: 2024-03-01 | Disposition: A | Discharge status: Home / Self Care | Payer: 59 | Attending: Gastroenterology | Admitting: Gastroenterology

## 2024-03-01 ENCOUNTER — Ambulatory Visit (HOSPITAL_COMMUNITY): Admitting: Anesthesiology

## 2024-03-01 ENCOUNTER — Other Ambulatory Visit: Payer: Self-pay

## 2024-03-01 DIAGNOSIS — K449 Diaphragmatic hernia without obstruction or gangrene: Secondary | ICD-10-CM | POA: Insufficient documentation

## 2024-03-01 DIAGNOSIS — K3189 Other diseases of stomach and duodenum: Secondary | ICD-10-CM | POA: Insufficient documentation

## 2024-03-01 DIAGNOSIS — Z1211 Encounter for screening for malignant neoplasm of colon: Secondary | ICD-10-CM | POA: Diagnosis not present

## 2024-03-01 DIAGNOSIS — T478X5A Adverse effect of other agents primarily affecting gastrointestinal system, initial encounter: Secondary | ICD-10-CM | POA: Diagnosis not present

## 2024-03-01 DIAGNOSIS — Z980 Intestinal bypass and anastomosis status: Secondary | ICD-10-CM | POA: Diagnosis not present

## 2024-03-01 DIAGNOSIS — I10 Essential (primary) hypertension: Secondary | ICD-10-CM

## 2024-03-01 DIAGNOSIS — D132 Benign neoplasm of duodenum: Secondary | ICD-10-CM

## 2024-03-01 DIAGNOSIS — K641 Second degree hemorrhoids: Secondary | ICD-10-CM | POA: Diagnosis not present

## 2024-03-01 DIAGNOSIS — Z85038 Personal history of other malignant neoplasm of large intestine: Secondary | ICD-10-CM

## 2024-03-01 DIAGNOSIS — K2970 Gastritis, unspecified, without bleeding: Secondary | ICD-10-CM

## 2024-03-01 DIAGNOSIS — E785 Hyperlipidemia, unspecified: Secondary | ICD-10-CM

## 2024-03-01 DIAGNOSIS — M1990 Unspecified osteoarthritis, unspecified site: Secondary | ICD-10-CM | POA: Insufficient documentation

## 2024-03-01 DIAGNOSIS — Z87891 Personal history of nicotine dependence: Secondary | ICD-10-CM | POA: Diagnosis not present

## 2024-03-01 DIAGNOSIS — K219 Gastro-esophageal reflux disease without esophagitis: Secondary | ICD-10-CM | POA: Diagnosis not present

## 2024-03-01 DIAGNOSIS — Z1509 Genetic susceptibility to other malignant neoplasm: Secondary | ICD-10-CM | POA: Insufficient documentation

## 2024-03-01 DIAGNOSIS — K2270 Barrett's esophagus without dysplasia: Secondary | ICD-10-CM | POA: Insufficient documentation

## 2024-03-01 DIAGNOSIS — R1032 Left lower quadrant pain: Secondary | ICD-10-CM

## 2024-03-01 DIAGNOSIS — J439 Emphysema, unspecified: Secondary | ICD-10-CM | POA: Diagnosis not present

## 2024-03-01 DIAGNOSIS — J449 Chronic obstructive pulmonary disease, unspecified: Secondary | ICD-10-CM | POA: Insufficient documentation

## 2024-03-01 DIAGNOSIS — K625 Hemorrhage of anus and rectum: Secondary | ICD-10-CM

## 2024-03-01 DIAGNOSIS — K2289 Other specified disease of esophagus: Secondary | ICD-10-CM

## 2024-03-01 HISTORY — PX: COLONOSCOPY WITH PROPOFOL: SHX5780

## 2024-03-01 HISTORY — PX: MUSCLE BIOPSY: SHX716

## 2024-03-01 HISTORY — PX: ESOPHAGOGASTRODUODENOSCOPY (EGD) WITH PROPOFOL: SHX5813

## 2024-03-01 LAB — POCT I-STAT, CHEM 8
BUN: 9 mg/dL (ref 6–20)
Calcium, Ion: 1.2 mmol/L (ref 1.15–1.40)
Chloride: 100 mmol/L (ref 98–111)
Creatinine, Ser: 0.9 mg/dL (ref 0.61–1.24)
Glucose, Bld: 105 mg/dL — ABNORMAL HIGH (ref 70–99)
HCT: 46 % (ref 39.0–52.0)
Hemoglobin: 15.6 g/dL (ref 13.0–17.0)
Potassium: 3.5 mmol/L (ref 3.5–5.1)
Sodium: 134 mmol/L — ABNORMAL LOW (ref 135–145)
TCO2: 26 mmol/L (ref 22–32)

## 2024-03-01 SURGERY — ESOPHAGOGASTRODUODENOSCOPY (EGD) WITH PROPOFOL
Anesthesia: Monitor Anesthesia Care

## 2024-03-01 MED ORDER — PROPOFOL 10 MG/ML IV BOLUS
INTRAVENOUS | Status: AC
  Filled 2024-03-01: qty 20

## 2024-03-01 MED ORDER — PROPOFOL 500 MG/50ML IV EMUL
INTRAVENOUS | Status: AC
  Filled 2024-03-01: qty 50

## 2024-03-01 MED ORDER — PROPOFOL 10 MG/ML IV BOLUS
INTRAVENOUS | Status: DC | PRN
  Administered 2024-03-01 (×2): 30 mg via INTRAVENOUS

## 2024-03-01 MED ORDER — SODIUM CHLORIDE 0.9 % IV SOLN: INTRAVENOUS | Status: DC

## 2024-03-01 MED ORDER — PROPOFOL 500 MG/50ML IV EMUL
INTRAVENOUS | Status: DC | PRN
  Administered 2024-03-01: 125 ug/kg/min via INTRAVENOUS

## 2024-03-01 MED ORDER — LIDOCAINE 2% (20 MG/ML) 5 ML SYRINGE
INTRAMUSCULAR | Status: DC | PRN
  Administered 2024-03-01: 100 mg via INTRAVENOUS

## 2024-03-01 MED ORDER — PROPOFOL 10 MG/ML IV BOLUS
INTRAVENOUS | Status: AC
Start: 2024-03-01 — End: ?
  Filled 2024-03-01: qty 20

## 2024-03-01 SURGICAL SUPPLY — 23 items
BLOCK BITE 60FR ADLT L/F BLUE (MISCELLANEOUS) ×2 IMPLANT
ELECT REM PT RETURN 9FT ADLT (ELECTROSURGICAL) IMPLANT
ELECTRODE REM PT RTRN 9FT ADLT (ELECTROSURGICAL) IMPLANT
FLOOR PAD 36X40 (MISCELLANEOUS) ×2 IMPLANT
FORCEP RJ3 GP 1.8X160 W-NEEDLE (CUTTING FORCEPS) IMPLANT
FORCEPS BIOP RAD 4 LRG CAP 4 (CUTTING FORCEPS) IMPLANT
FORCEPS BIOP RJ4 240 W/NDL (CUTTING FORCEPS) IMPLANT
FORCEPS BXJMBJMB 240X2.8X (CUTTING FORCEPS) IMPLANT
INJECTOR/SNARE I SNARE (MISCELLANEOUS) IMPLANT
LUBRICANT JELLY 4.5OZ STERILE (MISCELLANEOUS) IMPLANT
MANIFOLD NEPTUNE II (INSTRUMENTS) IMPLANT
NDL SCLEROTHERAPY 25GX240 (NEEDLE) IMPLANT
NEEDLE SCLEROTHERAPY 25GX240 (NEEDLE) IMPLANT
PAD FLOOR 36X40 (MISCELLANEOUS) ×2 IMPLANT
PROBE APC STR FIRE (PROBE) IMPLANT
PROBE INJECTION GOLD 7FR (MISCELLANEOUS) IMPLANT
SNARE ROTATE MED OVAL 20MM (MISCELLANEOUS) IMPLANT
SNARE SHORT THROW 13M SML OVAL (MISCELLANEOUS) IMPLANT
SYR 50ML LL SCALE MARK (SYRINGE) IMPLANT
TRAP SPECIMEN MUCOUS 40CC (MISCELLANEOUS) IMPLANT
TUBING ENDO SMARTCAP PENTAX (MISCELLANEOUS) ×4 IMPLANT
TUBING IRRIGATION ENDOGATOR (MISCELLANEOUS) ×2 IMPLANT
WATER STERILE IRR 1000ML POUR (IV SOLUTION) IMPLANT

## 2024-03-01 NOTE — Op Note (Signed)
 [facility] Patient Name: Charles Santos Procedure Date: 03/01/2024 MRN: [id number] Attending MD: Corliss Parish , MD, [id number] Date of Birth: [birth year] CSN: [id number] Age: 53 Admit Type: Outpatient Procedure:                Upper GI endoscopy Indications:              Hereditary nonpolyposis colorectal cancer (Lynch                            Syndrome), Personal history of malignant neoplasm                            of the GI tract Providers:                Corliss Parish, MD, Doristine Mango, RN, Alan Ripper, Technician Referring MD:              Medicines:                Monitored Anesthesia Care Complications:            No immediate complications. Estimated Blood Loss:     Estimated blood loss was minimal. Procedure:                Pre-Anesthesia Assessment:                           - Prior to the procedure, a History and Physical                            was performed, and patient medications and                            allergies were reviewed. The patient's tolerance of                            previous anesthesia was also reviewed. The risks                            and benefits of the procedure and the sedation                            options and risks were discussed with the patient.                            All questions were answered, and informed consent                            was obtained. Prior Anticoagulants: The patient has                            taken no anticoagulant or antiplatelet agents                            except for  NSAID medication. ASA Grade Assessment:                            II - A patient with mild systemic disease. After                            reviewing the risks and benefits, the patient was                            deemed in satisfactory condition to undergo the                            procedure.                           After obtaining informed consent, the  endoscope was                            passed under direct vision. Throughout the                            procedure, the patient's blood pressure, pulse, and                            oxygen saturations were monitored continuously. The                            [id number]-H190 ([id number]) Olympus endoscope was introduced                            through the mouth, and advanced to the second part                            of duodenum. The upper GI endoscopy was                            accomplished without difficulty. The patient                            tolerated the procedure. Scope In: Scope Out: Findings:      No gross lesions were noted in the proximal esophagus and in the mid       esophagus.      One tongue of salmon-colored mucosa was present from 41 to 44 cm and two       tongues of salmon-colored mucosa were present from 42 to 44 cm. No other       visible abnormalities were present. The maximum longitudinal extent of       these esophageal mucosal changes was 3 cm in length. Mucosa was biopsied       with a cold forceps for histology in 4 quadrants at intervals of 2 cm. A       total of 2 specimen bottles were sent to pathology (41/42 cm and 43/44       cm).      A 2 cm hiatal hernia was present.  Patchy mildly erythematous mucosa without bleeding was found in the       entire examined stomach. Biopsies were taken with a cold forceps for       histology and Helicobacter pylori testing.      No gross lesions were noted in the duodenal bulb, in the first portion       of the duodenum and in the second portion of the duodenum. Impression:               - No gross lesions in the proximal esophagus and in                            the mid esophagus.                           - Salmon-colored mucosa suggestive of short-segment                            Barrett's esophagus found distally. Biopsied.                           - 2 cm hiatal hernia.                            - Erythematous mucosa in the stomach. Biopsied.                           - No gross lesions in the duodenal bulb, in the                            first portion of the duodenum and in the second                            portion of the duodenum. Moderate Sedation:      Not Applicable - Patient had care per Anesthesia. Recommendation:           - Proceed to scheduled Flexible Sigmoidoscopy.                           - Continue present medications.                           - Await pathology results. If Barrett's esophagus                            is noted on samples taken today, would normally                            recommend a 3-year follow-up if nondysplastic, but                            with underlying [name], okay to perform                            every 2 years with his flexible sigmoidoscopy. If  no evidence of Barrett's then repeat in 2 to 4                            years.                           - Repeat upper endoscopy, will be based on final                            pathology.                           - The findings and recommendations were discussed                            with the patient.                           - The findings and recommendations were discussed                            with the patient's family. Procedure Code(s):        --- Professional ---                           [phone], Esophagogastroduodenoscopy, flexible,                            transoral; with biopsy, single or multiple Diagnosis Code(s):        --- Professional ---                           K22.89, Other specified disease of esophagus                           K44.9, Diaphragmatic hernia without obstruction or                            gangrene                           K31.89, Other diseases of stomach and duodenum                           C18.9, Malignant neoplasm of colon, unspecified                           Z15.09, Genetic  susceptibility to other malignant                            neoplasm                           Z85.00, Personal history of malignant neoplasm of                            unspecified digestive organ CPT copyright 2022 American Medical  Association. All rights reserved. The codes documented in this report are preliminary and upon coder review may  be revised to meet current compliance requirements. Corliss Parish, MD 03/01/2024 12:32:34 PM Number of Addenda: 0

## 2024-03-01 NOTE — Anesthesia Preprocedure Evaluation (Signed)
 Anesthesia Evaluation  Patient identified by MRN, date of birth, ID band Patient awake    Reviewed: Allergy & Precautions, NPO status , Patient's Chart, lab work & pertinent test results  History of Anesthesia Complications (+) DIFFICULT AIRWAY and history of anesthetic complications  Airway Mallampati: III  TM Distance: >3 FB Neck ROM: Full    Dental no notable dental hx.    Pulmonary COPD, former smoker   Pulmonary exam normal breath sounds clear to auscultation       Cardiovascular hypertension, Pt. on medications Normal cardiovascular exam Rhythm:Regular Rate:Normal     Neuro/Psych  Headaches PSYCHIATRIC DISORDERS Anxiety Depression       GI/Hepatic Neg liver ROS,GERD  Medicated and Controlled,,Cancer of transverse colon    Endo/Other  negative endocrine ROS    Renal/GU negative Renal ROS  negative genitourinary   Musculoskeletal  (+) Arthritis , Osteoarthritis,    Abdominal  (+) + obese  Peds  Hematology HLD   Anesthesia Other Findings lynch syndrome hx of colon cancer hx of duodenal polyps  Reproductive/Obstetrics                             Anesthesia Physical Anesthesia Plan  ASA: 3  Anesthesia Plan: MAC   Post-op Pain Management: Minimal or no pain anticipated   Induction: Intravenous  PONV Risk Score and Plan: 1 and Treatment may vary due to age or medical condition and Propofol infusion  Airway Management Planned: Nasal Cannula  Additional Equipment: None  Intra-op Plan:   Post-operative Plan:   Informed Consent: I have reviewed the patients History and Physical, chart, labs and discussed the procedure including the risks, benefits and alternatives for the proposed anesthesia with the patient or authorized representative who has indicated his/her understanding and acceptance.       Plan Discussed with: CRNA  Anesthesia Plan Comments:          Anesthesia Quick Evaluation

## 2024-03-01 NOTE — Discharge Instructions (Signed)

## 2024-03-01 NOTE — Op Note (Signed)
 [facility] Patient Name: Charles Santos Procedure Date: 03/01/2024 MRN: [id number] Attending MD: Corliss Parish , MD, [id number] Date of Birth: 21-Apr-1971 CSN: [id number] Age: 53 Admit Type: Outpatient Procedure:                Flexible Sigmoidoscopy Indications:              High risk colon cancer surveillance: Personal                            history of colon cancer, High risk colon cancer                            surveillance: Personal history of hereditary                            nonpolyposis colorectal cancer ([name]) Providers:                Corliss Parish, MD, Doristine Mango, RN, Alan Ripper, Technician Referring MD:              Medicines:                Monitored Anesthesia Care Complications:            No immediate complications. Estimated Blood Loss:     Estimated blood loss: none. Procedure:                Pre-Anesthesia Assessment:                           - Prior to the procedure, a History and Physical                            was performed, and patient medications and                            allergies were reviewed. The patient's tolerance of                            previous anesthesia was also reviewed. The risks                            and benefits of the procedure and the sedation                            options and risks were discussed with the patient.                            All questions were answered, and informed consent                            was obtained. Prior Anticoagulants: The patient has                            taken  no anticoagulant or antiplatelet agents                            except for NSAID medication. ASA Grade Assessment:                            II - A patient with mild systemic disease. After                            reviewing the risks and benefits, the patient was                            deemed in satisfactory condition to undergo the                             procedure.                           After obtaining informed consent, the scope was                            passed under direct vision. The [id number]-[id number] ([id number])                            Olympus endoscope was introduced through the anus                            and advanced to the the ileo-sigmoid anastomosis.                            After obtaining informed consent, the scope was                            passed under direct vision.The flexible                            sigmoidoscopy was accomplished without difficulty.                            The patient tolerated the procedure. The quality of                            the bowel preparation was adequate. Scope In: 12:12:44 PM Scope Out: 12:19:38 PM Scope Withdrawal Time: 0 hours 5 minutes 37 seconds  Total Procedure Duration: 0 hours 6 minutes 54 seconds  Findings:      The digital rectal exam findings include hemorrhoids. Pertinent       negatives include no palpable rectal lesions.      There was evidence of a prior functional end-to-end ileo-colonic       anastomosis in the sigmoid colon. This was patent and was characterized       by healthy appearing mucosa. The anastomosis was traversed.      The neoterminal ileum appeared normal.      Normal mucosa was found in the entire colon otherwise.  Non-bleeding non-thrombosed internal hemorrhoids were found during       retroflexion, during perianal exam and during digital exam. The       hemorrhoids were Grade II (internal hemorrhoids that prolapse but reduce       spontaneously). Impression:               - Hemorrhoids found on digital rectal exam.                           - Patent functional end-to-end ileo-colonic                            anastomosis, characterized by healthy appearing                            mucosa.                           - The neo-terminal ileum is normal.                           - Normal mucosa in the entire  examined colon                            otherwise.                           - Non-bleeding non-thrombosed internal hemorrhoids. Moderate Sedation:      Not Applicable - Patient had care per Anesthesia. Recommendation:           - The patient will be observed post-procedure,                            until all discharge criteria are met.                           - Discharge patient to home.                           - Patient has a contact number available for                            emergencies. The signs and symptoms of potential                            delayed complications were discussed with the                            patient. Return to normal activities tomorrow.                            Written discharge instructions were provided to the                            patient.                           -  Resume previous diet.                           - Repeat flexible sigmoidoscopy in 1 year for                            surveillance.                           - If patient continues to have episodes of                            abdominal discomfort, CT abdomen/pelvis with IV and                            oral contrast should be considered.                           - In regards to patient's bowel habits, consider                            potential role of cholestyramine if diarrhea                            continues to be an issue, as a result of patient                            not having an ICV, in case this could be helpful.                           - The findings and recommendations were discussed                            with the patient.                           - The findings and recommendations were discussed                            with the patient's family. Procedure Code(s):        --- Professional ---                           W2956, Colorectal cancer screening; flexible                            sigmoidoscopy Diagnosis Code(s):         --- Professional ---                           O13.086, Personal history of other malignant                            neoplasm of large intestine  Z15.09, Genetic susceptibility to other malignant                            neoplasm                           Z98.0, Intestinal bypass and anastomosis status                           K64.1, Second degree hemorrhoids CPT copyright 2022 [employer]. All rights reserved. The codes documented in this report are preliminary and upon coder review may  be revised to meet current compliance requirements. Corliss Parish, MD 03/01/2024 12:37:18 PM Number of Addenda: 0

## 2024-03-01 NOTE — Anesthesia Postprocedure Evaluation (Signed)
 Anesthesia Post Note  Patient: Charles Santos  Procedure(s) Performed: ESOPHAGOGASTRODUODENOSCOPY (EGD) WITH PROPOFOL COLONOSCOPY WITH PROPOFOL BIOPSY     Patient location during evaluation: PACU Anesthesia Type: MAC Level of consciousness: awake and alert Pain management: pain level controlled Vital Signs Assessment: post-procedure vital signs reviewed and stable Respiratory status: spontaneous breathing, nonlabored ventilation and respiratory function stable Cardiovascular status: blood pressure returned to baseline and stable Postop Assessment: no apparent nausea or vomiting Anesthetic complications: no   No notable events documented.  Last Vitals:  Vitals:   03/01/24 1241 03/01/24 1247  BP: 123/77 112/77  Pulse: 66 65  Resp: 11 13  Temp:    SpO2: 99% 99%    Last Pain:  Vitals:   03/01/24 1247  TempSrc:   PainSc: 0-No pain                 Lowella Curb

## 2024-03-01 NOTE — H&P (Signed)
 GASTROENTEROLOGY PROCEDURE H&P NOTE   Primary Care Physician: Mechele Claude, MD  HPI: Charles Santos is a 53 y.o. male who presents for EGD/Colonoscopy for underlying [name].  Past Medical History:  Diagnosis Date   Acute midline low back pain with bilateral sciatica 05/16/2020   Anxiety    Arthritis    Cancer of transverse colon (HCC) 09/17/2016   surgery done   Colon cancer [facility]) 1998   surgery and chemo   Complication of anesthesia    not fully under for polyp removal at [facility] 2017   Depression    Emphysema of lung (HCC)    Family hx of colon cancer    GERD (gastroesophageal reflux disease)    none recent   Headache    Hernia, inguinal    Left   Hyperlipidemia    Hypertension    Pneumonia    Vitamin D deficiency    Past Surgical History:  Procedure Laterality Date   APPENDECTOMY     BACK SURGERY     L4-L5 minimally invasive transforaminal lumbar interbody fusion with bilateral L4-L5 pedicle screw placement   BIOPSY  08/12/2022   Procedure: BIOPSY;  Surgeon: Lemar Lofty., MD;  Location: [facility];  Service: Gastroenterology;;   colon resectomy  1998   2 degree herida   COLONOSCOPY     ESOPHAGOGASTRODUODENOSCOPY (EGD) WITH PROPOFOL N/A 05/19/2017   Procedure: ESOPHAGOGASTRODUODENOSCOPY (EGD) WITH PROPOFOL;  Surgeon: Rachael Fee, MD;  Location: [facility];  Service: Endoscopy;  Laterality: N/A;   ESOPHAGOGASTRODUODENOSCOPY (EGD) WITH PROPOFOL N/A 06/04/2021   Procedure: ESOPHAGOGASTRODUODENOSCOPY (EGD) WITH PROPOFOL;  Surgeon: Rachael Fee, MD;  Location: [facility];  Service: Endoscopy;  Laterality: N/A;   FLEXIBLE SIGMOIDOSCOPY N/A 05/19/2017   Procedure: FLEXIBLE SIGMOIDOSCOPY;  Surgeon: Rachael Fee, MD;  Location: [facility];  Service: Endoscopy;  Laterality: N/A;   FLEXIBLE SIGMOIDOSCOPY N/A 06/04/2021   Procedure: FLEXIBLE SIGMOIDOSCOPY;  Surgeon: Rachael Fee, MD;  Location: [facility];  Service:  Endoscopy;  Laterality: N/A;   FLEXIBLE SIGMOIDOSCOPY N/A 08/12/2022   Procedure: FLEXIBLE SIGMOIDOSCOPY;  Surgeon: Meridee Score Netty Starring., MD;  Location: [facility];  Service: Gastroenterology;  Laterality: N/A;   INGUINAL HERNIA REPAIR     INGUINAL HERNIA REPAIR Left 01/07/2023   Procedure: OPEN LEFT INGUINAL HERNIA REPAIR;  Surgeon: Berna Bue, MD;  Location: [facility];  Service: General;  Laterality: Left;   LAPAROSCOPIC SUBTOTAL COLECTOMY N/A 09/17/2016   Procedure: DIAGNOSTIC LAPAROSCOPY EXPLORATORY LAPAROTOMY SUBTOTAL COLECTOMY PROCTOSCOPY;  Surgeon: Claud Kelp, MD;  Location: [facility];  Service: General;  Laterality: N/A;   polyp removal at [facility]  2017   POLYPECTOMY     POLYPECTOMY  08/12/2022   Procedure: POLYPECTOMY;  Surgeon: Lemar Lofty., MD;  Location: [facility];  Service: Gastroenterology;;   PROCTOSCOPY  09/17/2016   Procedure: PROCTOSCOPY;  Surgeon: Claud Kelp, MD;  Location: [facility];  Service: General;;   SIGMOIDOSCOPY     TRANSFORAMINAL LUMBAR INTERBODY FUSION W/ MIS 1 LEVEL Right 02/02/2021   Procedure: Right Lumbar four -lumbar five minimally invasive transforaminal lumbar interbody fusion;  Surgeon: Jadene Pierini, MD;  Location: [facility];  Service: Neurosurgery;  Laterality: Right;   UPPER GASTROINTESTINAL ENDOSCOPY     UPPER GI ENDOSCOPY     Current Facility-Administered Medications  Medication Dose Route Frequency Provider Last Rate Last Admin   0.9 %  sodium chloride infusion   Intravenous Continuous Mansouraty, Netty Starring., MD  Current Facility-Administered Medications:    0.9 %  sodium chloride infusion, , Intravenous, Continuous, Mansouraty, Netty Starring., MD Allergies  Allergen Reactions   Hydrocodone    Latex    Oxycodone Other (See Comments)    Made pt feel like a "ZOMBIE"   Adhesive [Tape] Rash and Other (See Comments)    Prefers paper tape   Family History  Problem Relation Age of Onset   Colon cancer Father         pt thinks he was in his 20's when dx   Esophageal cancer Neg Hx    Rectal cancer Neg Hx    Stomach cancer Neg Hx    Prostate cancer Neg Hx    Colon polyps Neg Hx    Social History   Socioeconomic History   Marital status: Divorced    Spouse name: Not on file   Number of children: 2   Years of education: Not on file   Highest education level: Not on file  Occupational History   Not on file  Tobacco Use   Smoking status: Former    Current packs/day: 0.00    Average packs/day: 1 pack/day for 23.0 years (23.0 ttl pk-yrs)    Types: Cigarettes    Start date: 10/07/1997    Quit date: 10/07/2020    Years since quitting: 3.4   Smokeless tobacco: Never  Vaping Use   Vaping status: Never Used  Substance and Sexual Activity   Alcohol use: Yes    Alcohol/week: 63.0 standard drinks of alcohol    Types: 63 Cans of beer per week   Drug use: Never   Sexual activity: Yes  Other Topics Concern   Not on file  Social History Narrative   Not on file   Social Drivers of Health   Financial Resource Strain: Not on file  Food Insecurity: Not on file  Transportation Needs: Not on file  Physical Activity: Not on file  Stress: Not on file  Social Connections: Not on file  Intimate Partner Violence: Not on file    Physical Exam: Today's Vitals   03/01/24 0955  BP: (!) 141/79  Pulse: 68  Resp: 14  Temp: 97.8 F (36.6 C)  TempSrc: Temporal  SpO2: 99%  Weight: 108.9 kg  Height: 5\' 10"  (1.778 m)  PainSc: 0-No pain   Body mass index is 34.44 kg/m. GEN: NAD EYE: Sclerae anicteric ENT: MMM CV: Non-tachycardic GI: Soft, NT/ND NEURO:  Alert & Oriented x 3  Lab Results: Recent Labs    03/01/24 1002  HGB 15.6  HCT 46.0   BMET Recent Labs    03/01/24 1002  NA 134*  K 3.5  CL 100  GLUCOSE 105*  BUN 9  CREATININE 0.90   LFT No results for input(s): "PROT", "ALBUMIN", "AST", "ALT", "ALKPHOS", "BILITOT", "BILIDIR", "IBILI" in the last 72 hours. PT/INR No results  for input(s): "LABPROT", "INR" in the last 72 hours.   Impression / Plan: This is a 53 y.o.male who presents for EGD/Colonoscopy for underlying [name].  The risks and benefits of endoscopic evaluation/treatment were discussed with the patient and/or family; these include but are not limited to the risk of perforation, infection, bleeding, missed lesions, lack of diagnosis, severe illness requiring hospitalization, as well as anesthesia and sedation related illnesses.  The patient's history has been reviewed, patient examined, no change in status, and deemed stable for procedure.  The patient and/or family is agreeable to proceed.    Corliss Parish, MD [facility]enter[facility]  Advanced Endoscopy Office # [phone]

## 2024-03-01 NOTE — Transfer of Care (Signed)
 Immediate Anesthesia Transfer of Care Note  Patient: Charles Santos  Procedure(s) Performed: ESOPHAGOGASTRODUODENOSCOPY (EGD) WITH PROPOFOL COLONOSCOPY WITH PROPOFOL BIOPSY  Patient Location: PACU and Endoscopy Unit  Anesthesia Type:MAC  Level of Consciousness: awake, alert , and patient cooperative  Airway & Oxygen Therapy: Patient Spontanous Breathing and Patient connected to face mask oxygen  Post-op Assessment: Report given to RN and Post -op Vital signs reviewed and stable  Post vital signs: Reviewed and stable  Last Vitals:  Vitals Value Taken Time  BP 123/65 03/01/24 1230  Temp    Pulse 60 03/01/24 1230  Resp 12 03/01/24 1230  SpO2 100 % 03/01/24 1230  Vitals shown include unfiled device data.  Last Pain:  Vitals:   03/01/24 0955  TempSrc: Temporal  PainSc: 0-No pain         Complications: No notable events documented.

## 2024-03-01 NOTE — Anesthesia Procedure Notes (Signed)
 Procedure Name: MAC Date/Time: 03/01/2024 11:48 AM  Performed by: Elyn Peers, CRNAPre-anesthesia Checklist: Patient identified, Emergency Drugs available, Suction available, Patient being monitored and Timeout performed Oxygen Delivery Method: Simple face mask Preoxygenation: POM used. Placement Confirmation: positive ETCO2

## 2024-03-02 LAB — SURGICAL PATHOLOGY

## 2024-03-04 ENCOUNTER — Encounter (HOSPITAL_COMMUNITY): Payer: Self-pay | Admitting: Gastroenterology

## 2024-03-06 ENCOUNTER — Encounter: Payer: Self-pay | Admitting: Gastroenterology

## 2024-03-08 ENCOUNTER — Telehealth: Payer: Self-pay

## 2024-03-08 ENCOUNTER — Other Ambulatory Visit: Payer: Self-pay | Admitting: Family Medicine

## 2024-03-08 MED ORDER — ROSUVASTATIN CALCIUM 10 MG PO TABS: 10.0000 mg | ORAL_TABLET | Freq: Every day | ORAL | 0 refills | Status: DC

## 2024-03-08 NOTE — Telephone Encounter (Signed)
 Copied from CRM [id number]. Topic: Clinical - Medication Refill >> Mar 08, 2024 11:19 AM Gery Pray wrote: Most Recent Primary Care Visit:   Medication: pantoprazole (PROTONIX) 40 MG tablet, rosuvastatin (CRESTOR) 10 MG tablet  Has the patient contacted their pharmacy? Yes (Agent: If no, request that the patient contact the pharmacy for the refill. If patient does not wish to contact the pharmacy document the reason why and proceed with request.) (Agent: If yes, when and what did the pharmacy advise?) Pharmacist sent a request over for the provider to release the refill  Is this the correct pharmacy for this prescription? Yes If no, delete pharmacy and type the correct one.  This is the patient's preferred pharmacy:  [facility] [place], [place] - [address] [place] [address] Phone: [phone] Fax: [phone]  Has the prescription been filled recently? No  Is the patient out of the medication? Yes  Has the patient been seen for an appointment in the last year OR does the patient have an upcoming appointment? Yes  Can we respond through MyChart? Yes  Agent: Please be advised that Rx refills may take up to 3 business days. We ask that you follow-up with your pharmacy.

## 2024-03-08 NOTE — Telephone Encounter (Signed)
Refill was already sent

## 2024-03-12 ENCOUNTER — Other Ambulatory Visit: Payer: Self-pay | Admitting: Family Medicine

## 2024-03-13 ENCOUNTER — Encounter: Payer: Self-pay | Admitting: Family Medicine

## 2024-03-13 ENCOUNTER — Ambulatory Visit (INDEPENDENT_AMBULATORY_CARE_PROVIDER_SITE_OTHER): Admitting: Family Medicine

## 2024-03-13 VITALS — BP 99/53 | HR 71 | Temp 97.8°F | Ht 70.0 in | Wt 235.0 lb

## 2024-03-13 DIAGNOSIS — I10 Essential (primary) hypertension: Secondary | ICD-10-CM | POA: Diagnosis not present

## 2024-03-13 DIAGNOSIS — K2100 Gastro-esophageal reflux disease with esophagitis, without bleeding: Secondary | ICD-10-CM | POA: Diagnosis not present

## 2024-03-13 DIAGNOSIS — E782 Mixed hyperlipidemia: Secondary | ICD-10-CM

## 2024-03-13 MED ORDER — PREGABALIN 200 MG PO CAPS: 200.0000 mg | ORAL_CAPSULE | Freq: Every day | ORAL | 1 refills | Status: DC

## 2024-03-13 MED ORDER — QUETIAPINE FUMARATE 100 MG PO TABS: 100.0000 mg | ORAL_TABLET | Freq: Every day | ORAL | 3 refills | Status: AC

## 2024-03-13 MED ORDER — TRIAMTERENE-HCTZ 37.5-25 MG PO TABS
1.0000 | ORAL_TABLET | Freq: Every day | ORAL | 0 refills | Status: DC
Start: 2024-03-13 — End: 2024-04-09

## 2024-03-13 MED ORDER — ROSUVASTATIN CALCIUM 10 MG PO TABS: 10.0000 mg | ORAL_TABLET | Freq: Every day | ORAL | 0 refills | Status: DC

## 2024-03-13 NOTE — Progress Notes (Signed)
 Subjective:  Patient ID: Charles Santos, male    DOB: 10/16/1971  Age: 53 y.o. MRN: [id number]  CC: Medical Management of Chronic Issues (Was having neck pain pain but it was been much better. //Was having abdominal pain and that is better as well. Quite drinking. )   HPI Jailan Trimm presents for  follow-up of hypertension. Patient has no history of headache chest pain or shortness of breath or recent cough. Patient also denies symptoms of TIA such as focal numbness or weakness. Patient denies side effects from medication. States taking it regularly.  Colon cancer scare turned out to be false alarm . Neck pain resolved 2  weeks ago. Continues to get reief of chronic pain with Lyrica. Quetiapine helping with sleep. Stopped drinking alcohol recently.    in for follow-up of elevated cholesterol. Doing well without complaints on current medication. Denies side effects of statin including myalgia and arthralgia and nausea. Currently no chest pain, shortness of breath or other cardiovascular related symptoms noted.     History Ashely has a past medical history of Acute midline low back pain with bilateral sciatica (05/16/2020), Anxiety, Arthritis, Cancer of transverse colon (HCC) (09/17/2016), Colon cancer (HCC) (1998), Complication of anesthesia, Depression, Emphysema of lung (HCC), Family hx of colon cancer, GERD (gastroesophageal reflux disease), Headache, Hernia, inguinal, Hyperlipidemia, Hypertension, Pneumonia, and Vitamin D deficiency.   He has a past surgical history that includes colon resectomy (1998); Appendectomy; Laparoscopic subtotal colectomy (N/A, 09/17/2016); Proctoscopy (09/17/2016); Inguinal hernia repair; Upper gi endoscopy; polyp removal at [facility] (2017); Flexible sigmoidoscopy (N/A, 05/19/2017); Esophagogastroduodenoscopy (egd) with propofol (N/A, 05/19/2017); Colonoscopy; Transforaminal lumbar interbody fusion w/ mis 1 level (Right, 02/02/2021); Polypectomy; Sigmoidoscopy;  Upper gastrointestinal endoscopy; Flexible sigmoidoscopy (N/A, 06/04/2021); Esophagogastroduodenoscopy (egd) with propofol (N/A, 06/04/2021); Flexible sigmoidoscopy (N/A, 08/12/2022); biopsy (08/12/2022); polypectomy (08/12/2022); Back surgery; Inguinal hernia repair (Left, 01/07/2023); Esophagogastroduodenoscopy (egd) with propofol (N/A, 03/01/2024); Colonoscopy with propofol (N/A, 03/01/2024); and Muscle biopsy (03/01/2024).   His family history includes Colon cancer in his father.He reports that he quit smoking about 3 years ago. His smoking use included cigarettes. He started smoking about 26 years ago. He has a 23 pack-year smoking history. He has never used smokeless tobacco. He reports current alcohol use of about 63.0 standard drinks of alcohol per week. He reports that he does not use drugs.  Current Outpatient Medications on File Prior to Visit  Medication Sig Dispense Refill   acetaminophen (TYLENOL) 500 MG tablet Take 500-1,000 mg by mouth every 6 (six) hours as needed for moderate pain (pain score 4-6).     albuterol (VENTOLIN HFA) 108 (90 Base) MCG/ACT inhaler Inhale into the lungs.     fenofibrate 160 MG tablet Take 1 tablet (160 mg total) by mouth daily. For cholesterol and triglyceride 90 tablet 3   Multiple Vitamin (MULTIVITAMIN WITH MINERALS) TABS tablet Take 1 tablet by mouth daily.     naproxen (NAPROSYN) 500 MG tablet TAKE ONE TABLET TWICE DAILY 180 tablet 1   Omega-3 Fatty Acids (FISH OIL) 1200 MG CAPS Take 1,200 mg by mouth in the morning.     pantoprazole (PROTONIX) 40 MG tablet TAKE ONE TABLET ONCE DAILY 30 tablet 0   polyethylene glycol powder (GLYCOLAX/MIRALAX) 17 GM/SCOOP powder Take 238 g by mouth as directed. 238 g 0   tiZANidine (ZANAFLEX) 4 MG tablet Take 1 tablet (4 mg total) by mouth every 6 (six) hours as needed for muscle spasms. 30 tablet 1   No current facility-administered medications on file prior  to visit.    ROS Review of Systems  Constitutional:  Negative  for fever.  Respiratory:  Negative for shortness of breath.   Cardiovascular:  Negative for chest pain.  Musculoskeletal:  Negative for arthralgias.  Skin:  Negative for rash.    Objective:  BP (!) 99/53   Pulse 71   Temp 97.8 F (36.6 C)   Ht 5\' 10"  (1.778 m)   Wt 235 lb (106.6 kg)   SpO2 96%   BMI 33.72 kg/m   BP Readings from Last 3 Encounters:  03/13/24 (!) 99/53  03/01/24 112/77  01/04/24 110/70    Wt Readings from Last 3 Encounters:  03/13/24 235 lb (106.6 kg)  03/01/24 240 lb (108.9 kg)  01/27/24 239 lb (108.4 kg)     Physical Exam Vitals reviewed.  Constitutional:      Appearance: He is well-developed.  HENT:     Head: Normocephalic and atraumatic.     Right Ear: External ear normal.     Left Ear: External ear normal.     Mouth/Throat:     Pharynx: No oropharyngeal exudate or posterior oropharyngeal erythema.  Eyes:     Pupils: Pupils are equal, round, and reactive to light.  Cardiovascular:     Rate and Rhythm: Normal rate and regular rhythm.     Heart sounds: No murmur heard. Pulmonary:     Effort: No respiratory distress.     Breath sounds: Normal breath sounds.  Musculoskeletal:        General: No swelling. Normal range of motion.     Cervical back: Normal range of motion and neck supple.  Skin:    General: Skin is warm and dry.  Neurological:     Mental Status: He is alert and oriented to person, place, and time.       Assessment & Plan:  Essential hypertension -     CMP14+EGFR -     Lipid panel  Mixed hyperlipidemia -     CMP14+EGFR -     Lipid panel  Gastroesophageal reflux disease with esophagitis without hemorrhage -     CMP14+EGFR -     Lipid panel  Other orders -     Triamterene-HCTZ; Take 1 tablet by mouth daily. FOR BLOOD PRESSURE AND FLUID  Dispense: 30 tablet; Refill: 0 -     QUEtiapine Fumarate; Take 1 tablet (100 mg total) by mouth at bedtime.  Dispense: 90 tablet; Refill: 3 -     Rosuvastatin Calcium; Take 1 tablet  (10 mg total) by mouth daily.  Dispense: 90 tablet; Refill: 0 -     Pregabalin; Take 1 capsule (200 mg total) by mouth at bedtime.  Dispense: 90 capsule; Refill: 1    Allergies as of 03/13/2024       Reactions   Hydrocodone    Latex    Oxycodone Other (See Comments)   Made pt feel like a "ZOMBIE"   Adhesive [tape] Rash, Other (See Comments)   Prefers paper tape        Medication List        Accurate as of March 13, 2024  3:44 PM. If you have any questions, ask your nurse or doctor.          STOP taking these medications    Lagevrio 200 MG Caps capsule Generic drug: molnupiravir EUA Stopped by: Zolton Dowson       TAKE these medications    acetaminophen 500 MG tablet Commonly known as: TYLENOL Take 500-1,000  mg by mouth every 6 (six) hours as needed for moderate pain (pain score 4-6).   albuterol 108 (90 Base) MCG/ACT inhaler Commonly known as: VENTOLIN HFA Inhale into the lungs.   fenofibrate 160 MG tablet Take 1 tablet (160 mg total) by mouth daily. For cholesterol and triglyceride   Fish Oil 1200 MG Caps Take 1,200 mg by mouth in the morning.   multivitamin with minerals Tabs tablet Take 1 tablet by mouth daily.   naproxen 500 MG tablet Commonly known as: NAPROSYN TAKE ONE TABLET TWICE DAILY   pantoprazole 40 MG tablet Commonly known as: PROTONIX TAKE ONE TABLET ONCE DAILY   polyethylene glycol powder 17 GM/SCOOP powder Commonly known as: GLYCOLAX/MIRALAX Take 238 g by mouth as directed.   pregabalin 200 MG capsule Commonly known as: LYRICA Take 1 capsule (200 mg total) by mouth at bedtime.   QUEtiapine 100 MG tablet Commonly known as: SEROQUEL Take 1 tablet (100 mg total) by mouth at bedtime.   rosuvastatin 10 MG tablet Commonly known as: CRESTOR Take 1 tablet (10 mg total) by mouth daily.   tiZANidine 4 MG tablet Commonly known as: ZANAFLEX Take 1 tablet (4 mg total) by mouth every 6 (six) hours as needed for muscle spasms.    triamterene-hydrochlorothiazide 37.5-25 MG tablet Commonly known as: MAXZIDE-25 Take 1 tablet by mouth daily. FOR BLOOD PRESSURE AND FLUID         Follow-up: Return in about 6 months (around 09/12/2024) for [facility] physical.  Mechele Claude, M.D.

## 2024-03-14 ENCOUNTER — Encounter: Payer: Self-pay | Admitting: Family Medicine

## 2024-03-14 ENCOUNTER — Other Ambulatory Visit: Payer: Self-pay | Admitting: Family Medicine

## 2024-03-14 LAB — LIPID PANEL
Chol/HDL Ratio: 3.5 ratio (ref 0.0–5.0)
Cholesterol, Total: 137 mg/dL (ref 100–199)
HDL: 39 mg/dL — ABNORMAL LOW (ref >39)
LDL Chol Calc (NIH): 53 mg/dL (ref 0–99)
Triglycerides: 293 mg/dL — ABNORMAL HIGH (ref 0–149)
VLDL Cholesterol Cal: 45 mg/dL — ABNORMAL HIGH (ref 5–40)

## 2024-03-14 LAB — CMP14+EGFR
ALT: 61 IU/L — ABNORMAL HIGH (ref 0–44)
AST: 60 IU/L — ABNORMAL HIGH (ref 0–40)
Albumin: 4.2 g/dL (ref 3.8–4.9)
Alkaline Phosphatase: 58 IU/L (ref 44–121)
BUN/Creatinine Ratio: 20 (ref 9–20)
BUN: 18 mg/dL (ref 6–24)
Bilirubin Total: 0.3 mg/dL (ref 0.0–1.2)
CO2: 22 mmol/L (ref 20–29)
Calcium: 9.4 mg/dL (ref 8.7–10.2)
Chloride: 104 mmol/L (ref 96–106)
Creatinine, Ser: 0.88 mg/dL (ref 0.76–1.27)
Globulin, Total: 2.2 g/dL (ref 1.5–4.5)
Glucose: 146 mg/dL — ABNORMAL HIGH (ref 70–99)
Potassium: 3.5 mmol/L (ref 3.5–5.2)
Sodium: 140 mmol/L (ref 134–144)
Total Protein: 6.4 g/dL (ref 6.0–8.5)
eGFR: 104 mL/min/{1.73_m2} (ref >59)

## 2024-03-14 MED ORDER — ROSUVASTATIN CALCIUM 20 MG PO TABS: 20.0000 mg | ORAL_TABLET | Freq: Every day | ORAL | 3 refills | Status: AC

## 2024-03-15 ENCOUNTER — Telehealth: Payer: Self-pay | Admitting: Family Medicine

## 2024-03-15 NOTE — Telephone Encounter (Signed)
 Pt given lab results per notes of Dr. Broadus John on 03/14/24. Patient wanted to know the exact numbers. Lipid panel numbers reviewed with patient. Pt verbalized understanding.  Mechele Claude, MD 03/14/2024  5:38 PM EDT     Your cholesterol is not at goal.  I would like for you to increase your Crestor to 20 mg daily.  I sent in that prescription.  Your glucose was also rather high.  I am going to have the staff add a test for diabetes.   Nurse: Please add A1c to the blood work previously drawn.  Thanks, WS   Copied from [employer] [id number]. Topic: Clinical - Lab/Test Results >> Mar 15, 2024  9:32 AM Higinio Roger wrote: Reason for CRM: Patient has further questions about his labs

## 2024-03-16 ENCOUNTER — Telehealth: Payer: Self-pay | Admitting: Family Medicine

## 2024-03-16 ENCOUNTER — Other Ambulatory Visit

## 2024-03-16 DIAGNOSIS — R7309 Other abnormal glucose: Secondary | ICD-10-CM

## 2024-03-16 LAB — BAYER DCA HB A1C WAIVED: HB A1C (BAYER DCA - WAIVED): 5.4 % (ref 4.8–5.6)

## 2024-03-16 NOTE — Telephone Encounter (Signed)
 Copied from CRM [id number]. Topic: Clinical - Request for Lab/Test Order >> Mar 16, 2024  9:33 AM Shon Hale wrote: Reason for CRM: Please add orders for A1c for patient's appointment today at 10:45am.

## 2024-03-16 NOTE — Telephone Encounter (Signed)
 This has already been addressed and A1C added.

## 2024-04-09 ENCOUNTER — Other Ambulatory Visit: Payer: Self-pay | Admitting: Family Medicine

## 2024-04-12 ENCOUNTER — Encounter: Payer: Self-pay | Admitting: *Deleted

## 2024-05-28 ENCOUNTER — Encounter (HOSPITAL_COMMUNITY): Payer: Self-pay

## 2024-05-28 ENCOUNTER — Other Ambulatory Visit: Payer: Self-pay

## 2024-05-28 ENCOUNTER — Observation Stay (HOSPITAL_COMMUNITY): Admission: EM | Admit: 2024-05-28 | Discharge: 2024-05-30 | Disposition: A | Discharge status: Home / Self Care

## 2024-05-28 ENCOUNTER — Emergency Department (HOSPITAL_COMMUNITY)

## 2024-05-28 ENCOUNTER — Telehealth: Payer: Self-pay | Admitting: Gastroenterology

## 2024-05-28 DIAGNOSIS — K449 Diaphragmatic hernia without obstruction or gangrene: Secondary | ICD-10-CM | POA: Diagnosis not present

## 2024-05-28 DIAGNOSIS — K2270 Barrett's esophagus without dysplasia: Secondary | ICD-10-CM | POA: Insufficient documentation

## 2024-05-28 DIAGNOSIS — E781 Pure hyperglyceridemia: Secondary | ICD-10-CM | POA: Insufficient documentation

## 2024-05-28 DIAGNOSIS — Z79899 Other long term (current) drug therapy: Secondary | ICD-10-CM | POA: Diagnosis not present

## 2024-05-28 DIAGNOSIS — D649 Anemia, unspecified: Secondary | ICD-10-CM

## 2024-05-28 DIAGNOSIS — Z87891 Personal history of nicotine dependence: Secondary | ICD-10-CM | POA: Insufficient documentation

## 2024-05-28 DIAGNOSIS — Z91040 Latex allergy status: Secondary | ICD-10-CM | POA: Diagnosis not present

## 2024-05-28 DIAGNOSIS — K2970 Gastritis, unspecified, without bleeding: Secondary | ICD-10-CM | POA: Diagnosis not present

## 2024-05-28 DIAGNOSIS — K921 Melena: Secondary | ICD-10-CM | POA: Diagnosis not present

## 2024-05-28 DIAGNOSIS — K219 Gastro-esophageal reflux disease without esophagitis: Secondary | ICD-10-CM | POA: Diagnosis not present

## 2024-05-28 DIAGNOSIS — K3189 Other diseases of stomach and duodenum: Secondary | ICD-10-CM | POA: Insufficient documentation

## 2024-05-28 DIAGNOSIS — K922 Gastrointestinal hemorrhage, unspecified: Principal | ICD-10-CM | POA: Insufficient documentation

## 2024-05-28 DIAGNOSIS — D62 Acute posthemorrhagic anemia: Secondary | ICD-10-CM | POA: Diagnosis not present

## 2024-05-28 DIAGNOSIS — K625 Hemorrhage of anus and rectum: Secondary | ICD-10-CM | POA: Diagnosis present

## 2024-05-28 DIAGNOSIS — Z85038 Personal history of other malignant neoplasm of large intestine: Secondary | ICD-10-CM | POA: Insufficient documentation

## 2024-05-28 DIAGNOSIS — I10 Essential (primary) hypertension: Secondary | ICD-10-CM | POA: Diagnosis present

## 2024-05-28 LAB — CBC
HCT: 38 % — ABNORMAL LOW (ref 39.0–52.0)
Hemoglobin: 12.9 g/dL — ABNORMAL LOW (ref 13.0–17.0)
MCH: 31.7 pg (ref 26.0–34.0)
MCHC: 33.9 g/dL (ref 30.0–36.0)
MCV: 93.4 fL (ref 80.0–100.0)
Platelets: 196 10*3/uL (ref 150–400)
RBC: 4.07 MIL/uL — ABNORMAL LOW (ref 4.22–5.81)
RDW: 13.6 % (ref 11.5–15.5)
WBC: 6.2 10*3/uL (ref 4.0–10.5)
nRBC: 0 % (ref 0.0–0.2)

## 2024-05-28 LAB — COMPREHENSIVE METABOLIC PANEL WITH GFR
ALT: 37 U/L (ref 0–44)
AST: 38 U/L (ref 15–41)
Albumin: 3.7 g/dL (ref 3.5–5.0)
Alkaline Phosphatase: 47 U/L (ref 38–126)
Anion gap: 9 (ref 5–15)
BUN: 26 mg/dL — ABNORMAL HIGH (ref 6–20)
CO2: 25 mmol/L (ref 22–32)
Calcium: 8.9 mg/dL (ref 8.9–10.3)
Chloride: 104 mmol/L (ref 98–111)
Creatinine, Ser: 0.92 mg/dL (ref 0.61–1.24)
GFR, Estimated: >60 mL/min (ref >60)
Glucose, Bld: 111 mg/dL — ABNORMAL HIGH (ref 70–99)
Potassium: 3.5 mmol/L (ref 3.5–5.1)
Sodium: 138 mmol/L (ref 135–145)
Total Bilirubin: 0.7 mg/dL (ref 0.0–1.2)
Total Protein: 6.7 g/dL (ref 6.5–8.1)

## 2024-05-28 LAB — HEMOGLOBIN AND HEMATOCRIT, BLOOD
HCT: 38 % — ABNORMAL LOW (ref 39.0–52.0)
HCT: 35.6 % — ABNORMAL LOW (ref 39.0–52.0)
Hemoglobin: 12.6 g/dL — ABNORMAL LOW (ref 13.0–17.0)
Hemoglobin: 11.7 g/dL — ABNORMAL LOW (ref 13.0–17.0)

## 2024-05-28 LAB — TYPE AND SCREEN
ABO/RH(D): A POS
Antibody Screen: NEGATIVE

## 2024-05-28 MED ORDER — ROSUVASTATIN CALCIUM 20 MG PO TABS
20.0000 mg | ORAL_TABLET | Freq: Every day | ORAL | Status: DC
  Administered 2024-05-28 – 2024-05-30 (×3): 20 mg via ORAL
  Filled 2024-05-28 (×3): qty 1

## 2024-05-28 MED ORDER — PANTOPRAZOLE SODIUM 40 MG IV SOLR
40.0000 mg | Freq: Two times a day (BID) | INTRAVENOUS | Status: DC
  Administered 2024-05-28 – 2024-05-30 (×4): 40 mg via INTRAVENOUS
  Filled 2024-05-28 (×4): qty 10

## 2024-05-28 MED ORDER — ACETAMINOPHEN 325 MG PO TABS
650.0000 mg | ORAL_TABLET | Freq: Four times a day (QID) | ORAL | Status: DC | PRN
Start: 2024-05-28 — End: 2024-05-30
  Administered 2024-05-30: 650 mg via ORAL
  Filled 2024-05-28: qty 2

## 2024-05-28 MED ORDER — ACETAMINOPHEN 650 MG RE SUPP: 650.0000 mg | Freq: Four times a day (QID) | RECTAL | Status: DC | PRN

## 2024-05-28 MED ORDER — SODIUM CHLORIDE 0.9 % IV SOLN: INTRAVENOUS | Status: DC

## 2024-05-28 MED ORDER — ONDANSETRON HCL 4 MG PO TABS: 4.0000 mg | ORAL_TABLET | Freq: Four times a day (QID) | ORAL | Status: DC | PRN

## 2024-05-28 MED ORDER — ONDANSETRON HCL 4 MG/2ML IJ SOLN: 4.0000 mg | Freq: Four times a day (QID) | INTRAMUSCULAR | Status: DC | PRN

## 2024-05-28 MED ORDER — PANTOPRAZOLE SODIUM 40 MG IV SOLR
80.0000 mg | Freq: Once | INTRAVENOUS | Status: AC
  Administered 2024-05-28: 80 mg via INTRAVENOUS
  Filled 2024-05-28: qty 20

## 2024-05-28 MED ORDER — QUETIAPINE FUMARATE 100 MG PO TABS: 100.0000 mg | ORAL_TABLET | Freq: Every evening | ORAL | Status: DC | PRN

## 2024-05-28 MED ORDER — SODIUM CHLORIDE 0.9 % IV SOLN: INTRAVENOUS | Status: AC

## 2024-05-28 MED ORDER — IOHEXOL 350 MG/ML SOLN
100.0000 mL | Freq: Once | INTRAVENOUS | Status: AC | PRN
  Administered 2024-05-28: 100 mL via INTRAVENOUS

## 2024-05-28 MED ORDER — PREGABALIN 75 MG PO CAPS
200.0000 mg | ORAL_CAPSULE | Freq: Every day | ORAL | Status: DC
  Administered 2024-05-28 – 2024-05-29 (×2): 200 mg via ORAL
  Filled 2024-05-28 (×2): qty 1

## 2024-05-28 NOTE — ED Provider Notes (Signed)
 [place] EMERGENCY DEPARTMENT AT [facility] Provider Note   CSN: [id number] Arrival date & time: 05/28/24  1104     Patient presents with: Rectal Bleeding   Charles Santos is a 53 y.o. male.   53 year old male presents for evaluation of rectal bleeding.  He states it started 3 days ago where he has been passing blood clots.  He states since then he has had dark black stools and felt fatigued and short of breath with exertion.  He has a history of colon cancer which is currently in remission as far as he knows.  He states a few years ago he had another colonoscopy after a bout of rectal bleeding with that was negative.  He denies any nausea, vomiting, abdominal pain, or any other symptoms or concerns at this time peer denies any straining with bowel movements.   Rectal Bleeding Associated symptoms: no abdominal pain, no fever and no vomiting        Prior to Admission medications   Medication Sig Start Date End Date Taking? Authorizing Provider  acetaminophen  (TYLENOL ) 500 MG tablet Take 500-1,000 mg by mouth every 6 (six) hours as needed for moderate pain (pain score 4-6).   Yes [provider]  fenofibrate  160 MG tablet Take 1 tablet (160 mg total) by mouth daily. For cholesterol and triglyceride 02/13/24  Yes Stacks, Vallorie Gayer, MD  Multiple Vitamin (MULTIVITAMIN WITH MINERALS) TABS tablet Take 1 tablet by mouth daily. 11/12/20  Yes Armenta Landau, MD  naproxen  (NAPROSYN ) 500 MG tablet TAKE ONE TABLET TWICE DAILY 03/12/24  Yes Roise Cleaver, MD  Omega-3 Fatty Acids (FISH OIL) 1200 MG CAPS Take 1,200 mg by mouth See admin instructions. 1am 2apm   Yes [provider]  pantoprazole  (PROTONIX ) 40 MG tablet TAKE ONE TABLET ONCE DAILY 04/09/24  Yes Roise Cleaver, MD  pregabalin  (LYRICA ) 200 MG capsule Take 1 capsule (200 mg total) by mouth at bedtime. 03/13/24  Yes Roise Cleaver, MD  QUEtiapine  (SEROQUEL ) 100 MG tablet Take 1 tablet (100 mg total) by mouth at  bedtime. Patient taking differently: Take 100 mg by mouth at bedtime as needed (Sleep). 03/13/24  Yes Roise Cleaver, MD  rosuvastatin  (CRESTOR ) 20 MG tablet Take 1 tablet (20 mg total) by mouth daily. 03/14/24  Yes Stacks, Vallorie Gayer, MD  triamterene -hydrochlorothiazide (MAXZIDE-25) 37.5-25 MG tablet TAKE ONE TABLET DAILY FOR BLOOD PRESSURE AND fluid 04/09/24  Yes Stacks, Vallorie Gayer, MD    Allergies: Hydrocodone , Latex, Oxycodone , and Adhesive [tape]    Review of Systems  Constitutional:  Negative for chills and fever.  HENT:  Negative for ear pain and sore throat.   Eyes:  Negative for pain and visual disturbance.  Respiratory:  Negative for cough and shortness of breath.   Cardiovascular:  Negative for chest pain and palpitations.  Gastrointestinal:  Positive for blood in stool and hematochezia. Negative for abdominal pain and vomiting.  Genitourinary:  Negative for dysuria and hematuria.  Musculoskeletal:  Negative for arthralgias and back pain.  Skin:  Negative for color change and rash.  Neurological:  Negative for seizures and syncope.  All other systems reviewed and are negative.   Updated Vital Signs BP 135/78 (BP Location: Left Arm)   Pulse 65   Temp 98.7 F (37.1 C)   Resp 17   Ht 5' 10 (1.778 m)   Wt 104.3 kg   SpO2 99%   BMI 33.00 kg/m   Physical Exam Vitals and nursing note reviewed.  Constitutional:  General: He is not in acute distress.    Appearance: Normal appearance. He is well-developed. He is not ill-appearing.  HENT:     Head: Normocephalic and atraumatic.   Eyes:     Conjunctiva/sclera: Conjunctivae normal.    Cardiovascular:     Rate and Rhythm: Normal rate and regular rhythm.     Heart sounds: Normal heart sounds. No murmur heard. Pulmonary:     Effort: Pulmonary effort is normal. No respiratory distress.     Breath sounds: Normal breath sounds.  Abdominal:     General: There is no distension.     Palpations: Abdomen is soft. There is no mass.      Tenderness: There is no abdominal tenderness.     Hernia: No hernia is present.   Musculoskeletal:        General: No swelling.     Cervical back: Neck supple.   Skin:    General: Skin is warm and dry.     Capillary Refill: Capillary refill takes less than 2 seconds.   Neurological:     General: No focal deficit present.     Mental Status: He is alert.   Psychiatric:        Mood and Affect: Mood normal.     (all labs ordered are listed, but only abnormal results are displayed) Labs Reviewed  COMPREHENSIVE METABOLIC PANEL WITH GFR - Abnormal; Notable for the following components:      Result Value   Glucose, Bld 111 (*)    BUN 26 (*)    All other components within normal limits  CBC - Abnormal; Notable for the following components:   RBC 4.07 (*)    Hemoglobin 12.9 (*)    HCT 38.0 (*)    All other components within normal limits  HEMOGLOBIN AND HEMATOCRIT, BLOOD  HEMOGLOBIN AND HEMATOCRIT, BLOOD  POC OCCULT BLOOD, ED  TYPE AND SCREEN    EKG: None  Radiology: CT ANGIO GI BLEED Result Date: 05/28/2024 CLINICAL DATA:  Dark red rectal bleeding for 4 days. Weakness. History colon cancer. EXAM: CTA ABDOMEN AND PELVIS WITHOUT AND WITH CONTRAST TECHNIQUE: Multidetector CT imaging of the abdomen and pelvis was performed using the standard protocol during bolus administration of intravenous contrast. Multiplanar reconstructed images and MIPs were obtained and reviewed to evaluate the vascular anatomy. RADIATION DOSE REDUCTION: This exam was performed according to the departmental dose-optimization program which includes automated exposure control, adjustment of the mA and/or kV according to patient size and/or use of iterative reconstruction technique. CONTRAST:  OMNIPAQUE  IOHEXOL  350 MG/ML SOLN COMPARISON:  CT abdomen/pelvis dated 11/25/2023. FINDINGS: VASCULAR Aorta: Normal caliber aorta without aneurysm, dissection, or significant stenosis. Scattered aortic atherosclerosis.  Celiac: Patent without evidence of aneurysm, dissection, or significant stenosis. SMA: Patent without evidence of aneurysm, dissection, or significant stenosis. Renals: Patent without evidence of aneurysm, dissection, or significant stenosis. IMA: Patent without evidence of aneurysm, dissection, or significant stenosis. Inflow: Patent without evidence of aneurysm, dissection, or significant stenosis. Proximal Outflow: Bilateral common femoral and visualized portions of the superficial and profunda femoral arteries are patent without evidence of aneurysm, dissection, or significant stenosis. Veins: Patent without appreciable abnormality. Review of the MIP images confirms the above findings. NON-VASCULAR Lower chest: No acute abnormality. Hepatobiliary: No focal liver abnormality is seen. Small calcified gallstone. No gallbladder wall thickening or pericholecystic fluid. No biliary dilatation. Pancreas: Unremarkable. No pancreatic ductal dilatation or surrounding inflammatory changes. Spleen: Normal in size without focal abnormality. Adrenals/Urinary Tract: Adrenal glands are  unremarkable. Kidneys are normal, without renal calculi, focal lesion, or hydronephrosis. Bladder is unremarkable. Stomach/Bowel: Stomach is within normal limits. No evidence of bowel obstruction. Prior subtotal colectomy with ileocolic anastomosis in the right hemipelvis. Anastomosis appears intact. Following intravenous contrast administration, there is no intraluminal contrast extravasation to suggest active bleeding on CT. Lymphatic: No pathologically enlarged abdominal or pelvic lymph nodes. Reproductive: Prostate is unremarkable. Other: No abdominopelvic ascites.  No intraperitoneal free air. Musculoskeletal: Posterior fusion at L4-L5. No acute osseous abnormality or suspicious osseous lesion. IMPRESSION: VASCULAR No evidence of active GI bleeding on CT. NON-VASCULAR 1. No acute localizing findings in the abdomen or pelvis. 2. Prior  subtotal colectomy with ileocolic anastomosis in the right hemipelvis. 3. Cholelithiasis without evidence of acute cholecystitis. Aortic Atherosclerosis (ICD10-I70.0). Electronically Signed   By: Mannie Seek M.D.   On: 05/28/2024 14:02     Procedures   Medications Ordered in the ED  acetaminophen  (TYLENOL ) tablet 650 mg (has no administration in time range)    Or  acetaminophen  (TYLENOL ) suppository 650 mg (has no administration in time range)  ondansetron  (ZOFRAN ) tablet 4 mg (has no administration in time range)    Or  ondansetron  (ZOFRAN ) injection 4 mg (has no administration in time range)  pantoprazole  (PROTONIX ) injection 40 mg (has no administration in time range)  pantoprazole  (PROTONIX ) injection 80 mg (80 mg Intravenous Given 05/28/24 1144)  iohexol  (OMNIPAQUE ) 350 MG/ML injection 100 mL (100 mLs Intravenous Contrast Given 05/28/24 1257)    Clinical Course as of 05/28/24 1642  Mon May 28, 2024  1349 Recheck: Patient feeling well no new complaints, able to ambulate to the restroom  [MK]    Clinical Course User Index [MK] Kelle Pate, DO                                 Medical Decision Making Medical Decision Making Nursing notes are reviewed. 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 Lanell Pinta - GI agreed with plan for Protonix  and admission and they will consult on the patient  Studies: CT angiogram reviewed and interpreted independently by me and shows evidence of no active GI bleeding or extravasation, no other acute abnormality in the abdomen  Cardiac monitor interpretation: No ectopy, sinus rhythm  Emergency Department Course:  Vital signs and pulse  oximetry are reviewed, evaluated by myself and found to be within normal limits prior to final disposition. Findings of laboratory testing and medical imaging are discussed with patient and family that is available. Patient agrees with the medical care plan as follows:  Workup reviewed by me he is anemic for him, hemoglobin is 12.9 usually he runs in the 15's.  Vitals are stable.  Labs otherwise unremarkable and imaging as above.  Was given 40 mg of Protonix .  GI consult as above.  I spoke with the hospitalist regarding the patient's case and patient will be admitted for further workup and management.  Patient is agreeable with the plan.  All results and plan discussed with him.  Problems Addressed: History of colon cancer: chronic illness or injury Lower GI bleed: acute illness or injury that poses a threat to life or bodily functions  Symptomatic anemia: acute illness or injury Upper GI bleed: acute illness or injury that poses a threat to life or bodily functions  Amount and/or Complexity of Data Reviewed External Data Reviewed: notes. Labs: ordered. Decision-making details documented in ED Course. Radiology: ordered and independent interpretation performed. Decision-making details documented in ED Course.  Risk Prescription drug management. Drug therapy requiring intensive monitoring for toxicity. Decision regarding hospitalization.    Final diagnoses:  Upper GI bleed  Lower GI bleed  Symptomatic anemia  History of colon cancer    ED Discharge Orders     None          Kelle Pate, DO 05/28/24 1642

## 2024-05-28 NOTE — ED Triage Notes (Addendum)
 Patient has had dark red rectal bleeding for 4 days. Feels that he passed clots. Denies abdominal pain. Feels weak. Denies blood thinners. History of colon cancer.

## 2024-05-28 NOTE — Consult Note (Addendum)
 Referring Provider: Dr. Lucienne Ryder Primary Care Physician:  Roise Cleaver, MD Primary Gastroenterologist: Dr. Brice Campi  Reason for Consultation: Black stools and bright red hematochezia   HPI: Charles Santos is a 53 y.o. 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   He presented to the ED today due to having melena and bright red hematochezia x 3 days. Hg 12.9 down from 15.6 two months ago.  BUN 26 up from 18. CTA negative for active GI bleed. FOBT ordered, not yet completed. He endorsed passing 3 black loose stools with clots on Thursday 6/12 without associated abdominal pain. Friday 6/13 he passed 2 or 3 black stools with a tint of red blood. Saturday 6/14 he drank a lot of fluid and noted passing brighter red x 3 episodes. Sunday passed a few black stools with a little red blood and passed a black stool at home this am x 1 and another similar black stool about 30 minutes ago in the ED. He had mild right mid abdominal pain for about 30 minutes while in the ED which abated otherwise no recent abdominal pain. No anal rectal pain. No nausea or vomiting. No heartburn or dysphagia. No chest pain or shortness of breath but endorses having fatigue. He takes Pantoprazole  40 mg once daily.  He takes Naproxen  500 mg 1 tab p.o. twice daily for the past year due to having neck, back and leg pain. He quit smoking cigarettes 4 years ago. He previously drank eight 16 ounce beers daily for many years then cut back to 8 beers once weekly three months ago. He has a history of [name] with a duodenal adenoma status post EMR at [facility] 12/2016, diagnosed with colon  cancer at the age of 54 and was later diagnosed with adenocarcinoma the transverse colon in 2017 stage II status post colectomy 09/2016.  Father with [name] and colon cancer.  His most recent surveillance EGD and flexible sigmoidoscopy were completed 02/2024.  The EGD showed a 2 cm hiatal hernia, Barrett's esophagus and the duodenal bulb was unremarkable. The flexible sigmoidoscopy showed a patent functional end to end ileocolonic anastomosis and nonbleeding internal hemorrhoids.  A repeat flexible sigmoidoscopy in 1 year was recommended.  He recalled having a lower GI bleed 1 day post colonoscopy by Dr. Howard Macho, suspected post polypectomy bleed. He denies having any prior history of an upper GI bleed. Documented history of difficult airway intubation.  GI PROCEDURES:   Colonoscopy 09/17/2016:  Transverse mass/polyp:path report consistent with adenocarcinoma.   Sigmoid polyp:  path consistent with tubulovillous adenoma.  EGD 05/2017: Minor gastritis, [facility] EMR duodenal site looked normal, repeat 2 years   Flexible sigmoidoscopy 05/2017 with Dr. Howard Macho: Normal IC anastomosis at 20 cm   Flexible sigmoidoscopy October 2019: Entire colon normal to the ileocolonic anastomosis, about 25 cm from anus   EGD October 2020: Moderate nonspecific pan gastritis.  Small 1 to 2 mm discrete sessile polyp removed at the site of the 2018 duodenal adenoma EMR, surrounding mucosa of the region was nonspecifically edematous/erythematous.  Biopsies taken.  Pathology showed no sign of neoplasm   Flexible sigmoidoscopy 09/2019: Ileocolonic anastomosis was normal appearing, located 25 cm from anus.  No polyps.  Repeat 1 year   EGD 06/04/2021: Normal upper GI tract, repeat 3 years   Flexible sigmoidoscopy 06/04/2021: Normal ileocolonic anastomosis at 25 cm from anal verge.  No polyps or cancers.  Repeat 1 year   Flexible sigmoidoscopy 08/12/2022 with Dr. Brice Campi:  - Hemorrhoids found on rectal exam - patent  end-to-end ileocolonic anastomosis characterized at anastomosis by congestion and erosion which was biopsied (negative for neoplasm).   - Neoterminal ileum normal.   - One 3 mm polyp (hyperplastic) in rectum, removed with cold snare.  Resected and retrieved.   - Normal mucosa in colon otherwise.  - Nonbleeding nonthrombosed internal hemorrhoids.   - Repeat 1 year  EGD 03/01/2024: - No gross lesions in the proximal esophagus and in the mid esophagus.  - Salmon-colored mucosa suggestive of short-segment Barrett's esophagus found distally. Biopsied. - 2 cm hiatal hernia.  - Erythematous mucosa in the stomach. Biopsied.  - No gross lesions in the duodenal bulb, in the first portion of the duodenum and in the second portion of the duodenum. If no evidence of Barrett's then repeat in 2 to 4 years.  - Repeat upper endoscopy, will be based on final pathology  A. STOMACH, BIOPSY:       Gastric antral / oxyntic with proton pump inhibitor effect.       No H. pylori identified on HE stain.       Negative for intestinal metaplasia or dysplasia.   B. ESOPHAGUS, 43-44, BIOPSY:       Squamocolumnar mucosa with reactive changes.       Negative for intestinal metaplasia or dysplasia.   C. ESOPHAGUS, 41-42, BIOPSY:       Barretts esophagus.       Negative for dysplasia.   Flexible sigmoidoscopy 03/01/2024: - Hemorrhoids found on digital rectal exam. - Patent functional end-to-end ileo-colonic anastomosis, characterized by healthy appearing mucosa.  - The neo-terminal ileum is normal.  - Normal mucosa in the entire examined colon otherwise.  - Non-bleeding non-thrombosed internal hemorrhoids. - Surveillance flexible sigmoidoscopy in 1 year.  Past Medical History:  Diagnosis Date   Acute midline low back pain with bilateral sciatica 05/16/2020   Anxiety    Arthritis    Cancer of transverse colon (HCC) 09/17/2016   surgery done   Colon cancer [facility]) 1998   surgery and chemo   Complication of  anesthesia    not fully under for polyp removal at [facility] 2017   Depression    Emphysema of lung (HCC)    Family hx of colon cancer    GERD (gastroesophageal reflux disease)    none recent   Headache    Hernia, inguinal    Left   Hyperlipidemia    Hypertension    Pneumonia    Vitamin D  deficiency     Past Surgical History:  Procedure Laterality Date   APPENDECTOMY     BACK SURGERY     L4-L5 minimally invasive transforaminal lumbar interbody fusion with bilateral L4-L5 pedicle screw placement   BIOPSY  08/12/2022   Procedure: BIOPSY;  Surgeon: Normie Becton., MD;  Location: [facility];  Service: Gastroenterology;;   colon resectomy  1998   2 degree herida   COLONOSCOPY     COLONOSCOPY WITH PROPOFOL  N/A 03/01/2024   Procedure: COLONOSCOPY WITH PROPOFOL ;  Surgeon: Normie Becton., MD;  Location: [facility];  Service: Gastroenterology;  Laterality: N/A;   ESOPHAGOGASTRODUODENOSCOPY (EGD) WITH PROPOFOL  N/A 05/19/2017   Procedure: ESOPHAGOGASTRODUODENOSCOPY (EGD)  WITH PROPOFOL ;  Surgeon: Janel Medford, MD;  Location: [facility];  Service: Endoscopy;  Laterality: N/A;   ESOPHAGOGASTRODUODENOSCOPY (EGD) WITH PROPOFOL  N/A 06/04/2021   Procedure: ESOPHAGOGASTRODUODENOSCOPY (EGD) WITH PROPOFOL ;  Surgeon: Janel Medford, MD;  Location: [facility];  Service: Endoscopy;  Laterality: N/A;   ESOPHAGOGASTRODUODENOSCOPY (EGD) WITH PROPOFOL  N/A 03/01/2024   Procedure: ESOPHAGOGASTRODUODENOSCOPY (EGD) WITH PROPOFOL ;  Surgeon: Brice Campi Albino Alu., MD;  Location: [facility];  Service: Gastroenterology;  Laterality: N/A;   FLEXIBLE SIGMOIDOSCOPY N/A 05/19/2017   Procedure: FLEXIBLE SIGMOIDOSCOPY;  Surgeon: Janel Medford, MD;  Location: [facility];  Service: Endoscopy;  Laterality: N/A;   FLEXIBLE SIGMOIDOSCOPY N/A 06/04/2021   Procedure: FLEXIBLE SIGMOIDOSCOPY;  Surgeon: Janel Medford, MD;  Location: [facility];  Service: Endoscopy;  Laterality: N/A;    FLEXIBLE SIGMOIDOSCOPY N/A 08/12/2022   Procedure: FLEXIBLE SIGMOIDOSCOPY;  Surgeon: Brice Campi Albino Alu., MD;  Location: [facility];  Service: Gastroenterology;  Laterality: N/A;   INGUINAL HERNIA REPAIR     INGUINAL HERNIA REPAIR Left 01/07/2023   Procedure: OPEN LEFT INGUINAL HERNIA REPAIR;  Surgeon: Adalberto Acton, MD;  Location: [facility];  Service: General;  Laterality: Left;   LAPAROSCOPIC SUBTOTAL COLECTOMY N/A 09/17/2016   Procedure: DIAGNOSTIC LAPAROSCOPY EXPLORATORY LAPAROTOMY SUBTOTAL COLECTOMY PROCTOSCOPY;  Surgeon: Boyce Byes, MD;  Location: [facility];  Service: General;  Laterality: N/A;   MUSCLE BIOPSY  03/01/2024   Procedure: BIOPSY;  Surgeon: Normie Becton., MD;  Location: [facility];  Service: Gastroenterology;;   polyp removal at [facility]  2017   POLYPECTOMY     POLYPECTOMY  08/12/2022   Procedure: POLYPECTOMY;  Surgeon: Normie Becton., MD;  Location: [facility];  Service: Gastroenterology;;   PROCTOSCOPY  09/17/2016   Procedure: PROCTOSCOPY;  Surgeon: Boyce Byes, MD;  Location: [facility];  Service: General;;   SIGMOIDOSCOPY     TRANSFORAMINAL LUMBAR INTERBODY FUSION W/ MIS 1 LEVEL Right 02/02/2021   Procedure: Right Lumbar four -lumbar five minimally invasive transforaminal lumbar interbody fusion;  Surgeon: Cannon Champion, MD;  Location: [facility];  Service: Neurosurgery;  Laterality: Right;   UPPER GASTROINTESTINAL ENDOSCOPY     UPPER GI ENDOSCOPY      Prior to Admission medications   Medication Sig Start Date End Date Taking? Authorizing Provider  acetaminophen  (TYLENOL ) 500 MG tablet Take 500-1,000 mg by mouth every 6 (six) hours as needed for moderate pain (pain score 4-6).    [provider]  albuterol  (VENTOLIN  HFA) 108 (90 Base) MCG/ACT inhaler Inhale into the lungs. 08/05/23 08/04/24  [provider]  fenofibrate  160 MG tablet Take 1 tablet (160 mg total) by mouth daily. For cholesterol and triglyceride 02/13/24    Roise Cleaver, MD  Multiple Vitamin (MULTIVITAMIN WITH MINERALS) TABS tablet Take 1 tablet by mouth daily. 11/12/20   Armenta Landau, MD  naproxen  (NAPROSYN ) 500 MG tablet TAKE ONE TABLET TWICE DAILY 03/12/24   Roise Cleaver, MD  Omega-3 Fatty Acids (FISH OIL) 1200 MG CAPS Take 1,200 mg by mouth in the morning.    [provider]  pantoprazole  (PROTONIX ) 40 MG tablet TAKE ONE TABLET ONCE DAILY 04/09/24   Roise Cleaver, MD  polyethylene glycol powder (GLYCOLAX /MIRALAX ) 17 GM/SCOOP powder Take 238 g by mouth as directed. 02/24/24   Mansouraty, Albino Alu., MD  pregabalin  (LYRICA ) 200 MG capsule Take 1 capsule (200 mg total) by mouth at bedtime. 03/13/24   Roise Cleaver, MD  QUEtiapine  (SEROQUEL ) 100 MG tablet Take 1 tablet (100 mg total) by mouth  at bedtime. 03/13/24   Roise Cleaver, MD  rosuvastatin  (CRESTOR ) 20 MG tablet Take 1 tablet (20 mg total) by mouth daily. 03/14/24   Roise Cleaver, MD  tiZANidine  (ZANAFLEX ) 4 MG tablet Take 1 tablet (4 mg total) by mouth every 6 (six) hours as needed for muscle spasms. 04/20/23   Roise Cleaver, MD  triamterene -hydrochlorothiazide (MAXZIDE-25) 37.5-25 MG tablet TAKE ONE TABLET DAILY FOR BLOOD PRESSURE AND fluid 04/09/24   Roise Cleaver, MD    Current Facility-Administered Medications  Medication Dose Route Frequency Provider Last Rate Last Admin   acetaminophen  (TYLENOL ) tablet 650 mg  650 mg Oral Q6H PRN Danice Dural, MD       Or   acetaminophen  (TYLENOL ) suppository 650 mg  650 mg Rectal Q6H PRN Danice Dural, MD       ondansetron  (ZOFRAN ) tablet 4 mg  4 mg Oral Q6H PRN Danice Dural, MD       Or   ondansetron  (ZOFRAN ) injection 4 mg  4 mg Intravenous Q6H PRN Danice Dural, MD       pantoprazole  (PROTONIX ) injection 40 mg  40 mg Intravenous Q12H Tory Freiberg, NP        Allergies as of 05/28/2024 - Review Complete 05/28/2024  Allergen Reaction Noted   Hydrocodone   05/05/2023   Latex  05/05/2023    Oxycodone  Other (See Comments) 02/16/2021   Adhesive [tape] Rash and Other (See Comments) 05/16/2017    Family History  Problem Relation Age of Onset   Colon cancer Father        pt thinks he was in his 43's when dx   Esophageal cancer Neg Hx    Rectal cancer Neg Hx    Stomach cancer Neg Hx    Prostate cancer Neg Hx    Colon polyps Neg Hx     Social History   Socioeconomic History   Marital status: Divorced    Spouse name: Not on file   Number of children: 2   Years of education: Not on file   Highest education level: Not on file  Occupational History   Not on file  Tobacco Use   Smoking status: Former    Current packs/day: 0.00    Average packs/day: 1 pack/day for 23.0 years (23.0 ttl pk-yrs)    Types: Cigarettes    Start date: 10/07/1997    Quit date: 10/07/2020    Years since quitting: 3.6   Smokeless tobacco: Never  Vaping Use   Vaping status: Never Used  Substance and Sexual Activity   Alcohol use: Yes    Alcohol/week: 63.0 standard drinks of alcohol    Types: 63 Cans of beer per week   Drug use: Never   Sexual activity: Yes  Other Topics Concern   Not on file  Social History Narrative   Not on file   Social Drivers of Health   Financial Resource Strain: Not on file  Food Insecurity: Not on file  Transportation Needs: Not on file  Physical Activity: Not on file  Stress: Not on file  Social Connections: Not on file  Intimate Partner Violence: Not on file   Review of Systems: Gen: Denies fever, sweats or chills. No weight loss.  CV: Denies chest pain, palpitations or edema. Resp: Denies cough, shortness of breath of hemoptysis.  GI: See HPI. GU : Denies urinary burning, blood in urine, increased urinary frequency or incontinence. MS: + Neck, back and leg pain. Derm: Denies rash, itchiness, skin lesions or  unhealing ulcers. Psych:+ Anxiety and depression. Heme: Denies easy bruising, bleeding. Neuro:  Denies headaches, dizziness or  paresthesias. Endo:  Denies any problems with DM, thyroid  or adrenal function.  Physical Exam: Vital signs in last 24 hours: Temp:  [98.4 F (36.9 C)-98.7 F (37.1 C)] 98.7 F (37.1 C) (06/16 1621) Pulse Rate:  [64-73] 65 (06/16 1621) Resp:  [13-17] 17 (06/16 1621) BP: (115-157)/(62-83) 135/78 (06/16 1621) SpO2:  [98 %-100 %] 99 % (06/16 1621) Weight:  [104.3 kg] 104.3 kg (06/16 1107)   General: Alert 53 year old male in no acute distress. Head:  Normocephalic and atraumatic. Eyes:  No scleral icterus. Conjunctiva pink. Ears:  Normal auditory acuity. Nose:  No deformity, discharge or lesions. Mouth:  Dentition intact. No ulcers or lesions.  Neck:  Supple. No lymphadenopathy or thyromegaly.  Lungs: Breath sounds clear throughout. No wheezes, rhonchi or crackles.  Heart: Rate and rhythm, no murmurs. Abdomen: Soft, nondistended.  Nontender.  Positive bowel sounds to all 4 quadrants. Rectal: Deferred. Musculoskeletal:  Symmetrical without gross deformities.  Pulses:  Normal pulses noted. Extremities:  Without clubbing or edema. Neurologic:  Alert and  oriented x 4. No focal deficits.  Skin:  Intact without significant lesions or rashes. Psych:  Alert and cooperative. Normal mood and affect.  Intake/Output from previous day: No intake/output data recorded. Intake/Output this shift: No intake/output data recorded.  Lab Results: Recent Labs    05/28/24 1143  WBC 6.2  HGB 12.9*  HCT 38.0*  PLT 196   BMET Recent Labs    05/28/24 1143  NA 138  K 3.5  CL 104  CO2 25  GLUCOSE 111*  BUN 26*  CREATININE 0.92  CALCIUM  8.9   LFT Recent Labs    05/28/24 1143  PROT 6.7  ALBUMIN 3.7  AST 38  ALT 37  ALKPHOS 47  BILITOT 0.7    Studies/Results: CT ANGIO GI BLEED Result Date: 05/28/2024 CLINICAL DATA:  Dark red rectal bleeding for 4 days. Weakness. History colon cancer. EXAM: CTA ABDOMEN AND PELVIS WITHOUT AND WITH CONTRAST TECHNIQUE: Multidetector CT imaging of  the abdomen and pelvis was performed using the standard protocol during bolus administration of intravenous contrast. Multiplanar reconstructed images and MIPs were obtained and reviewed to evaluate the vascular anatomy. RADIATION DOSE REDUCTION: This exam was performed according to the departmental dose-optimization program which includes automated exposure control, adjustment of the mA and/or kV according to patient size and/or use of iterative reconstruction technique. CONTRAST:  OMNIPAQUE  IOHEXOL  350 MG/ML SOLN COMPARISON:  CT abdomen/pelvis dated 11/25/2023. FINDINGS: VASCULAR Aorta: Normal caliber aorta without aneurysm, dissection, or significant stenosis. Scattered aortic atherosclerosis. Celiac: Patent without evidence of aneurysm, dissection, or significant stenosis. SMA: Patent without evidence of aneurysm, dissection, or significant stenosis. Renals: Patent without evidence of aneurysm, dissection, or significant stenosis. IMA: Patent without evidence of aneurysm, dissection, or significant stenosis. Inflow: Patent without evidence of aneurysm, dissection, or significant stenosis. Proximal Outflow: Bilateral common femoral and visualized portions of the superficial and profunda femoral arteries are patent without evidence of aneurysm, dissection, or significant stenosis. Veins: Patent without appreciable abnormality. Review of the MIP images confirms the above findings. NON-VASCULAR Lower chest: No acute abnormality. Hepatobiliary: No focal liver abnormality is seen. Small calcified gallstone. No gallbladder wall thickening or pericholecystic fluid. No biliary dilatation. Pancreas: Unremarkable. No pancreatic ductal dilatation or surrounding inflammatory changes. Spleen: Normal in size without focal abnormality. Adrenals/Urinary Tract: Adrenal glands are unremarkable. Kidneys are normal, without renal calculi, focal lesion, or hydronephrosis.  Bladder is unremarkable. Stomach/Bowel: Stomach is  within normal limits. No evidence of bowel obstruction. Prior subtotal colectomy with ileocolic anastomosis in the right hemipelvis. Anastomosis appears intact. Following intravenous contrast administration, there is no intraluminal contrast extravasation to suggest active bleeding on CT. Lymphatic: No pathologically enlarged abdominal or pelvic lymph nodes. Reproductive: Prostate is unremarkable. Other: No abdominopelvic ascites.  No intraperitoneal free air. Musculoskeletal: Posterior fusion at L4-L5. No acute osseous abnormality or suspicious osseous lesion. IMPRESSION: VASCULAR No evidence of active GI bleeding on CT. NON-VASCULAR 1. No acute localizing findings in the abdomen or pelvis. 2. Prior subtotal colectomy with ileocolic anastomosis in the right hemipelvis. 3. Cholelithiasis without evidence of acute cholecystitis. Aortic Atherosclerosis (ICD10-I70.0). Electronically Signed   By: Mannie Seek M.D.   On: 05/28/2024 14:02    IMPRESSION/PLAN:  53 year old male with a history of GERD, Barrett's esophagus and a duodenal adenoma presenting with melena and bright red hematochezia. Hg 12.9 down from 15.6 two months ago. BUN 26 up from 18. CTA negative for active GI bleed. On Naproxen  500mg  bid x 1 year.  He is most recent surveillance EGD 02/2024 showed a 2 cm hiatal hernia, Barrett's esophagus and the duodenal bulb was unremarkable. -Clear liquid diet -NPO after midnight  -EGD benefits and risks discussed including risk with sedation, risk of bleeding, perforation and infection  -Continue Pantoprazole  40 mg IV twice daily -Check H&H every 6 hours x 24 hours -Transfuse for  for hemoglobin level less than 7 or as needed if symptomatic - No NSAIDs - Await further recommendations per Dr. 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  Bright red hematochezia x 3 episodes  on 6/14.  His most recent flexible sigmoidoscopy 02/2024 showed a patent functional end-to-end ileocolonic anastomosis and nonbleeding internal hemorrhoids. -Defer flex sigmoidoscopy for now  Alcohol use disorder -Patient counseled to continue to reduce alcohol intake with goal of complete alcohol cessation        Everett Hitt, NP 05/28/2024, 3:47 PM  Gastroenterology rounding physician:  I have seen the patient, examined him, reviewed old medical records and current information and the nurse practitioner's note above.  The majority of the medical decision making was performed by me.  The patient has a history of [name] and 2 surgeries for colon cancer, Barrett's esophagus and a history of duodenal adenoma who now has 3 to 4-day history of melena and some hematochezia that I suspect is an upper GI bleed.  He has chronic NSAID use though he is on a PPI.  Plan to treat as above and EGD tomorrow.  I have reviewed the risks benefits and indications of the procedure and he understands and agrees to proceed.  Kenney Peacemaker, MD, [facility] See Tilford Foley on call - gastroenterology for best contact person 05/28/2024 4:56 PM

## 2024-05-28 NOTE — H&P (Signed)
 History and Physical    Patient: Charles Santos ZOX:[id number] DOB: 06-23-1971 DOA: 05/28/2024 DOS: the patient was seen and examined on 05/28/2024 PCP: Roise Cleaver, MD  Patient coming from: Home  Chief Complaint:  Chief Complaint  Patient presents with   Rectal Bleeding   HPI: Charles Santos is a 53 y.o. male with medical history significant of acute low back pain with bilateral sciatica, anxiety, depression, osteoarthritis, colon cancer, partial colectomy, history of chemotherapy, lung emphysema, GERD, headache, inguinal hernia, hyperlipidemia, hypertension, pneumonia, vitamin D  deficiency who presented to the emergency department with a history of multiple daily melanotic stools and rectal bleeding since Thursday.  No abdominal pain, nausea or emesis.  He show you seen by the he has been having some postural lightheadedness.He denied fever, chills, rhinorrhea, sore throat, wheezing or hemoptysis. No chest pain, palpitations, diaphoresis, PND, orthopnea or pitting edema of the lower extremities. No flank pain, dysuria, frequency or hematuria.  No polyuria, polydipsia, polyphagia or blurred vision.   Lab work: CBC showed a white count of 6.2, hemoglobin 12.9 g/dL and platelets 811.  His previous hemoglobin level has ranged from 15.2-16.3 in the last year and a half.  CMP showed a glucose of 111 and BUN of 26 mg/dL, the rest of the CMP measurements were normal.  Imaging: CTA GI bleed with no acute localizing findings in the abdomen or pelvis.  There is prior subtotal colectomy with ileocolic anastomosis in the right hemipelvis.  Cholelithiasis without evidence of acute cholecystitis.  Aortic atherosclerosis.   ED course: Initial vital signs were temperature 98.5 F, pulse 73, respirations 16, BP 157/62 mmHg O2 sat 100% on room air.  Patient received pantoprazole  80 mg IVP.  [facility] consulted and will evaluate the patient.  Review of Systems: As mentioned in the history of present illness. All  other systems reviewed and are negative. Past Medical History:  Diagnosis Date   Acute midline low back pain with bilateral sciatica 05/16/2020   Anxiety    Arthritis    Cancer of transverse colon (HCC) 09/17/2016   surgery done   Colon cancer [facility]) 1998   surgery and chemo   Complication of anesthesia    not fully under for polyp removal at [facility] 2017   Depression    Emphysema of lung (HCC)    Family hx of colon cancer    GERD (gastroesophageal reflux disease)    none recent   Headache    Hernia, inguinal    Left   Hyperlipidemia    Hypertension    Pneumonia    Vitamin D  deficiency    Past Surgical History:  Procedure Laterality Date   APPENDECTOMY     BACK SURGERY     L4-L5 minimally invasive transforaminal lumbar interbody fusion with bilateral L4-L5 pedicle screw placement   BIOPSY  08/12/2022   Procedure: BIOPSY;  Surgeon: Normie Becton., MD;  Location: [facility];  Service: Gastroenterology;;   colon resectomy  1998   2 degree herida   COLONOSCOPY     COLONOSCOPY WITH PROPOFOL  N/A 03/01/2024   Procedure: COLONOSCOPY WITH PROPOFOL ;  Surgeon: Normie Becton., MD;  Location: [facility];  Service: Gastroenterology;  Laterality: N/A;   ESOPHAGOGASTRODUODENOSCOPY (EGD) WITH PROPOFOL  N/A 05/19/2017   Procedure: ESOPHAGOGASTRODUODENOSCOPY (EGD) WITH PROPOFOL ;  Surgeon: Janel Medford, MD;  Location: [facility];  Service: Endoscopy;  Laterality: N/A;   ESOPHAGOGASTRODUODENOSCOPY (EGD) WITH PROPOFOL  N/A 06/04/2021   Procedure: ESOPHAGOGASTRODUODENOSCOPY (EGD) WITH PROPOFOL ;  Surgeon: Janel Medford, MD;  Location: [facility];  Service: Endoscopy;  Laterality: N/A;   ESOPHAGOGASTRODUODENOSCOPY (EGD) WITH PROPOFOL  N/A 03/01/2024   Procedure: ESOPHAGOGASTRODUODENOSCOPY (EGD) WITH PROPOFOL ;  Surgeon: Brice Campi Albino Alu., MD;  Location: [facility];  Service: Gastroenterology;  Laterality: N/A;   FLEXIBLE SIGMOIDOSCOPY N/A 05/19/2017    Procedure: FLEXIBLE SIGMOIDOSCOPY;  Surgeon: Janel Medford, MD;  Location: [facility];  Service: Endoscopy;  Laterality: N/A;   FLEXIBLE SIGMOIDOSCOPY N/A 06/04/2021   Procedure: FLEXIBLE SIGMOIDOSCOPY;  Surgeon: Janel Medford, MD;  Location: [facility];  Service: Endoscopy;  Laterality: N/A;   FLEXIBLE SIGMOIDOSCOPY N/A 08/12/2022   Procedure: FLEXIBLE SIGMOIDOSCOPY;  Surgeon: Brice Campi Albino Alu., MD;  Location: [facility];  Service: Gastroenterology;  Laterality: N/A;   INGUINAL HERNIA REPAIR     INGUINAL HERNIA REPAIR Left 01/07/2023   Procedure: OPEN LEFT INGUINAL HERNIA REPAIR;  Surgeon: Adalberto Acton, MD;  Location: [facility];  Service: General;  Laterality: Left;   LAPAROSCOPIC SUBTOTAL COLECTOMY N/A 09/17/2016   Procedure: DIAGNOSTIC LAPAROSCOPY EXPLORATORY LAPAROTOMY SUBTOTAL COLECTOMY PROCTOSCOPY;  Surgeon: Boyce Byes, MD;  Location: [facility];  Service: General;  Laterality: N/A;   MUSCLE BIOPSY  03/01/2024   Procedure: BIOPSY;  Surgeon: Normie Becton., MD;  Location: [facility];  Service: Gastroenterology;;   polyp removal at [facility]  2017   POLYPECTOMY     POLYPECTOMY  08/12/2022   Procedure: POLYPECTOMY;  Surgeon: Normie Becton., MD;  Location: [facility];  Service: Gastroenterology;;   PROCTOSCOPY  09/17/2016   Procedure: PROCTOSCOPY;  Surgeon: Boyce Byes, MD;  Location: [facility];  Service: General;;   SIGMOIDOSCOPY     TRANSFORAMINAL LUMBAR INTERBODY FUSION W/ MIS 1 LEVEL Right 02/02/2021   Procedure: Right Lumbar four -lumbar five minimally invasive transforaminal lumbar interbody fusion;  Surgeon: Cannon Champion, MD;  Location: [facility];  Service: Neurosurgery;  Laterality: Right;   UPPER GASTROINTESTINAL ENDOSCOPY     UPPER GI ENDOSCOPY     Social History:  reports that he quit smoking about 3 years ago. His smoking use included cigarettes. He started smoking about 26 years ago. He has a 23 pack-year smoking history. He has  never used smokeless tobacco. He reports current alcohol use of about 63.0 standard drinks of alcohol per week. He reports that he does not use drugs.  Allergies  Allergen Reactions   Hydrocodone     Latex    Oxycodone  Other (See Comments)    Made pt feel like a ZOMBIE   Adhesive [Tape] Rash and Other (See Comments)    Prefers paper tape    Family History  Problem Relation Age of Onset   Colon cancer Father        pt thinks he was in his 28's when dx   Esophageal cancer Neg Hx    Rectal cancer Neg Hx    Stomach cancer Neg Hx    Prostate cancer Neg Hx    Colon polyps Neg Hx     Prior to Admission medications   Medication Sig Start Date End Date Taking? Authorizing Provider  acetaminophen  (TYLENOL ) 500 MG tablet Take 500-1,000 mg by mouth every 6 (six) hours as needed for moderate pain (pain score 4-6).    [provider]  albuterol  (VENTOLIN  HFA) 108 (90 Base) MCG/ACT inhaler Inhale into the lungs. 08/05/23 08/04/24  [provider]  fenofibrate  160 MG tablet Take 1 tablet (160 mg total) by mouth daily. For cholesterol and triglyceride 02/13/24   Roise Cleaver, MD  Multiple Vitamin (MULTIVITAMIN WITH  MINERALS) TABS tablet Take 1 tablet by mouth daily. 11/12/20   Armenta Landau, MD  naproxen  (NAPROSYN ) 500 MG tablet TAKE ONE TABLET TWICE DAILY 03/12/24   Roise Cleaver, MD  Omega-3 Fatty Acids (FISH OIL) 1200 MG CAPS Take 1,200 mg by mouth in the morning.    [provider]  pantoprazole  (PROTONIX ) 40 MG tablet TAKE ONE TABLET ONCE DAILY 04/09/24   Roise Cleaver, MD  polyethylene glycol powder (GLYCOLAX /MIRALAX ) 17 GM/SCOOP powder Take 238 g by mouth as directed. 02/24/24   Mansouraty, Albino Alu., MD  pregabalin  (LYRICA ) 200 MG capsule Take 1 capsule (200 mg total) by mouth at bedtime. 03/13/24   Roise Cleaver, MD  QUEtiapine  (SEROQUEL ) 100 MG tablet Take 1 tablet (100 mg total) by mouth at bedtime. 03/13/24   Roise Cleaver, MD  rosuvastatin  (CRESTOR ) 20 MG  tablet Take 1 tablet (20 mg total) by mouth daily. 03/14/24   Roise Cleaver, MD  tiZANidine  (ZANAFLEX ) 4 MG tablet Take 1 tablet (4 mg total) by mouth every 6 (six) hours as needed for muscle spasms. 04/20/23   Roise Cleaver, MD  triamterene -hydrochlorothiazide (MAXZIDE-25) 37.5-25 MG tablet TAKE ONE TABLET DAILY FOR BLOOD PRESSURE AND fluid 04/09/24   Roise Cleaver, MD    Physical Exam: Vitals:   05/28/24 1108 05/28/24 1200 05/28/24 1230 05/28/24 1300  BP: (!) 157/62 122/69 115/72 115/72  Pulse: 73 73  73  Resp: 16 16  16   Temp: 98.5 F (36.9 C)     TempSrc: Oral     SpO2: 100% 100%  100%  Weight:      Height:       Physical Exam Vitals and nursing note reviewed.  Constitutional:      General: He is awake. He is not in acute distress.    Appearance: Normal appearance. He is ill-appearing.  HENT:     Head: Normocephalic.     Nose: No rhinorrhea.     Mouth/Throat:     Mouth: Mucous membranes are moist.   Eyes:     General: No scleral icterus.    Pupils: Pupils are equal, round, and reactive to light.   Neck:     Vascular: No JVD.   Cardiovascular:     Rate and Rhythm: Normal rate and regular rhythm.     Heart sounds: S1 normal and S2 normal.  Pulmonary:     Breath sounds: No wheezing, rhonchi or rales.  Abdominal:     General: There is no distension.     Palpations: Abdomen is soft.     Tenderness: There is no abdominal tenderness. There is no right CVA tenderness or left CVA tenderness.   Musculoskeletal:     Cervical back: Neck supple.     Right lower leg: No edema.     Left lower leg: No edema.   Skin:    General: Skin is warm and dry.   Neurological:     General: No focal deficit present.     Mental Status: He is alert and oriented to person, place, and time.   Psychiatric:        Mood and Affect: Mood normal.        Behavior: Behavior normal. Behavior is cooperative.     Data Reviewed:  Results are pending, will review when available.  Assessment  and Plan: Principal Problem:   Acute lower GI bleeding Hematochezia/melena Admit to PCU/inpatient. Clear liquid diet Keep NPO after midnight. Continue IV fluids. Continue pantoprazole  every 12 hours Monitor H&H. Transfuse  as needed. GI consult appreciated.  Active Problems:   Hypertriglyceridemia Will hold fenofibrate  for now.    Essential hypertension Hold diuretic. Monitor blood pressure.    Gastroesophageal reflux disease  On pantoprazole  as above.      Advance Care Planning:   Code Status: Full Code   Consults: [facility].  Family Communication:   Severity of Illness: The appropriate patient status for this patient is OBSERVATION. Observation status is judged to be reasonable and necessary in order to provide the required intensity of service to ensure the patient's safety. The patient's presenting symptoms, physical exam findings, and initial radiographic and laboratory data in the context of their medical condition is felt to place them at decreased risk for further clinical deterioration. Furthermore, it is anticipated that the patient will be medically stable for discharge from the hospital within 2 midnights of admission.   Author: Danice Dural, MD 05/28/2024 1:59 PM  For on call review [email or web address].   This document was prepared using Dragon voice recognition software and may contain some unintended transcription errors.

## 2024-05-28 NOTE — Telephone Encounter (Signed)
 Inbound call from patient states he has been passing blood for the past 3 says. Patient is now over at [facility]. Please advise.

## 2024-05-28 NOTE — Plan of Care (Signed)

## 2024-05-28 NOTE — Telephone Encounter (Signed)
 I let the pt know that I received his message and he should follow the instructions per the ED. He will call our office if needed after discharge

## 2024-05-29 ENCOUNTER — Encounter (HOSPITAL_COMMUNITY): Admission: EM | Disposition: A | Discharge status: Home / Self Care | Payer: Self-pay | Source: Home / Self Care

## 2024-05-29 ENCOUNTER — Observation Stay (HOSPITAL_COMMUNITY): Admitting: Anesthesiology

## 2024-05-29 ENCOUNTER — Encounter (HOSPITAL_COMMUNITY): Payer: Self-pay | Admitting: *Deleted

## 2024-05-29 DIAGNOSIS — K449 Diaphragmatic hernia without obstruction or gangrene: Secondary | ICD-10-CM | POA: Diagnosis not present

## 2024-05-29 DIAGNOSIS — K921 Melena: Secondary | ICD-10-CM | POA: Diagnosis not present

## 2024-05-29 DIAGNOSIS — K2970 Gastritis, unspecified, without bleeding: Secondary | ICD-10-CM | POA: Diagnosis not present

## 2024-05-29 DIAGNOSIS — K633 Ulcer of intestine: Secondary | ICD-10-CM

## 2024-05-29 DIAGNOSIS — K2270 Barrett's esophagus without dysplasia: Secondary | ICD-10-CM | POA: Diagnosis not present

## 2024-05-29 DIAGNOSIS — K922 Gastrointestinal hemorrhage, unspecified: Secondary | ICD-10-CM | POA: Diagnosis not present

## 2024-05-29 DIAGNOSIS — D62 Acute posthemorrhagic anemia: Secondary | ICD-10-CM

## 2024-05-29 DIAGNOSIS — Z85038 Personal history of other malignant neoplasm of large intestine: Secondary | ICD-10-CM

## 2024-05-29 DIAGNOSIS — Q402 Other specified congenital malformations of stomach: Secondary | ICD-10-CM | POA: Diagnosis not present

## 2024-05-29 HISTORY — PX: GIVENS CAPSULE STUDY: SHX5432

## 2024-05-29 HISTORY — PX: ESOPHAGOGASTRODUODENOSCOPY: SHX5428

## 2024-05-29 HISTORY — PX: BIOPSY OF SKIN SUBCUTANEOUS TISSUE AND/OR MUCOUS MEMBRANE: SHX6741

## 2024-05-29 LAB — CBC
HCT: 37.4 % — ABNORMAL LOW (ref 39.0–52.0)
Hemoglobin: 12.1 g/dL — ABNORMAL LOW (ref 13.0–17.0)
MCH: 31.4 pg (ref 26.0–34.0)
MCHC: 32.4 g/dL (ref 30.0–36.0)
MCV: 97.1 fL (ref 80.0–100.0)
Platelets: 184 10*3/uL (ref 150–400)
RBC: 3.85 MIL/uL — ABNORMAL LOW (ref 4.22–5.81)
RDW: 13.7 % (ref 11.5–15.5)
WBC: 5.5 10*3/uL (ref 4.0–10.5)
nRBC: 0 % (ref 0.0–0.2)

## 2024-05-29 LAB — COMPREHENSIVE METABOLIC PANEL WITH GFR
ALT: 33 U/L (ref 0–44)
AST: 34 U/L (ref 15–41)
Albumin: 3.2 g/dL — ABNORMAL LOW (ref 3.5–5.0)
Alkaline Phosphatase: 44 U/L (ref 38–126)
Anion gap: 10 (ref 5–15)
BUN: 16 mg/dL (ref 6–20)
CO2: 24 mmol/L (ref 22–32)
Calcium: 8.9 mg/dL (ref 8.9–10.3)
Chloride: 105 mmol/L (ref 98–111)
Creatinine, Ser: 0.89 mg/dL (ref 0.61–1.24)
GFR, Estimated: >60 mL/min (ref >60)
Glucose, Bld: 110 mg/dL — ABNORMAL HIGH (ref 70–99)
Potassium: 3.7 mmol/L (ref 3.5–5.1)
Sodium: 139 mmol/L (ref 135–145)
Total Bilirubin: 0.6 mg/dL (ref 0.0–1.2)
Total Protein: 6.1 g/dL — ABNORMAL LOW (ref 6.5–8.1)

## 2024-05-29 LAB — HIV ANTIBODY (ROUTINE TESTING W REFLEX): HIV Screen 4th Generation wRfx: NONREACTIVE

## 2024-05-29 SURGERY — EGD (ESOPHAGOGASTRODUODENOSCOPY)
Anesthesia: Monitor Anesthesia Care

## 2024-05-29 MED ORDER — PROPOFOL 500 MG/50ML IV EMUL
INTRAVENOUS | Status: DC | PRN
  Administered 2024-05-29: 125 ug/kg/min via INTRAVENOUS

## 2024-05-29 MED ORDER — PROPOFOL 10 MG/ML IV BOLUS
INTRAVENOUS | Status: DC | PRN
  Administered 2024-05-29: 40 mg via INTRAVENOUS
  Administered 2024-05-29: 90 mg via INTRAVENOUS

## 2024-05-29 MED ORDER — LACTATED RINGERS IV SOLN: INTRAVENOUS | Status: DC | PRN

## 2024-05-29 MED ORDER — LIDOCAINE 2% (20 MG/ML) 5 ML SYRINGE
INTRAMUSCULAR | Status: DC | PRN
  Administered 2024-05-29: 100 mg via INTRAVENOUS

## 2024-05-29 NOTE — H&P (View-Only) (Signed)
 [facility] Progress Note  CC:  Black stools and bright red hematochezia     Subjective: No further melena or hematochezia overnight. He had brief left mid/LLQ pain this am lasted for a few seconds then abated without recurrence. No CP or SOB. Remains NPO for EGD this afternoon.    Objective:  Vital signs in last 24 hours: Temp:  [97.5 F (36.4 C)-98.7 F (37.1 C)] 98.2 F (36.8 C) (06/17 0520) Pulse Rate:  [56-73] 60 (06/17 0520) Resp:  [13-18] 17 (06/17 0520) BP: (115-157)/(55-83) 120/73 (06/17 0520) SpO2:  [96 %-100 %] 96 % (06/17 0520) Weight:  [104.3 kg] 104.3 kg (06/16 1107) Last BM Date : 05/28/24 General: Alert 53 year old male in NAD. Heart: Regular rate and rhythm, no murmur.  Pulm: Breath sounds clear throughout.  Abdomen: Soft, nondistended. Nontender. Positive bowel sounds x 4 quadrants. Midline abd scar intact. No bruit.  Extremities:  No edema. Neurologic:  Alert and oriented x 5. Speech is clear. Moves all extremities equally.  Psych:  Alert and cooperative. Normal mood and affect.  Intake/Output from previous day: 06/16 0701 - 06/17 0700 In: 1743.7 [P.O.:860; I.V.:883.7] Out: 600 [Stool:600] Intake/Output this shift: No intake/output data recorded.  Lab Results: Recent Labs    05/28/24 1143 05/28/24 1632 05/28/24 2120 05/29/24 0449  WBC 6.2  --   --  5.5  HGB 12.9* 12.6* 11.7* 12.1*  HCT 38.0* 38.0* 35.6* 37.4*  PLT 196  --   --  184   BMET Recent Labs    05/28/24 1143 05/29/24 0449  NA 138 139  K 3.5 3.7  CL 104 105  CO2 25 24  GLUCOSE 111* 110*  BUN 26* 16  CREATININE 0.92 0.89  CALCIUM  8.9 8.9   LFT Recent Labs    05/29/24 0449  PROT 6.1*  ALBUMIN 3.2*  AST 34  ALT 33  ALKPHOS 44  BILITOT 0.6   PT/INR No results for input(s): LABPROT, INR in the last 72 hours. Hepatitis Panel No results for input(s): HEPBSAG, HCVAB, HEPAIGM, HEPBIGM in the last 72 hours.  CT ANGIO GI BLEED Result Date:  05/28/2024 CLINICAL DATA:  Dark red rectal bleeding for 4 days. Weakness. History colon cancer. EXAM: CTA ABDOMEN AND PELVIS WITHOUT AND WITH CONTRAST TECHNIQUE: Multidetector CT imaging of the abdomen and pelvis was performed using the standard protocol during bolus administration of intravenous contrast. Multiplanar reconstructed images and MIPs were obtained and reviewed to evaluate the vascular anatomy. RADIATION DOSE REDUCTION: This exam was performed according to the departmental dose-optimization program which includes automated exposure control, adjustment of the mA and/or kV according to patient size and/or use of iterative reconstruction technique. CONTRAST:  OMNIPAQUE  IOHEXOL  350 MG/ML SOLN COMPARISON:  CT abdomen/pelvis dated 11/25/2023. FINDINGS: VASCULAR Aorta: Normal caliber aorta without aneurysm, dissection, or significant stenosis. Scattered aortic atherosclerosis. Celiac: Patent without evidence of aneurysm, dissection, or significant stenosis. SMA: Patent without evidence of aneurysm, dissection, or significant stenosis. Renals: Patent without evidence of aneurysm, dissection, or significant stenosis. IMA: Patent without evidence of aneurysm, dissection, or significant stenosis. Inflow: Patent without evidence of aneurysm, dissection, or significant stenosis. Proximal Outflow: Bilateral common femoral and visualized portions of the superficial and profunda femoral arteries are patent without evidence of aneurysm, dissection, or significant stenosis. Veins: Patent without appreciable abnormality. Review of the MIP images confirms the above findings. NON-VASCULAR Lower chest: No acute abnormality. Hepatobiliary: No focal liver abnormality is seen. Small calcified gallstone. No gallbladder wall thickening or  pericholecystic fluid. No biliary dilatation. Pancreas: Unremarkable. No pancreatic ductal dilatation or surrounding inflammatory changes. Spleen: Normal in size without focal abnormality.  Adrenals/Urinary Tract: Adrenal glands are unremarkable. Kidneys are normal, without renal calculi, focal lesion, or hydronephrosis. Bladder is unremarkable. Stomach/Bowel: Stomach is within normal limits. No evidence of bowel obstruction. Prior subtotal colectomy with ileocolic anastomosis in the right hemipelvis. Anastomosis appears intact. Following intravenous contrast administration, there is no intraluminal contrast extravasation to suggest active bleeding on CT. Lymphatic: No pathologically enlarged abdominal or pelvic lymph nodes. Reproductive: Prostate is unremarkable. Other: No abdominopelvic ascites.  No intraperitoneal free air. Musculoskeletal: Posterior fusion at L4-L5. No acute osseous abnormality or suspicious osseous lesion. IMPRESSION: VASCULAR No evidence of active GI bleeding on CT. NON-VASCULAR 1. No acute localizing findings in the abdomen or pelvis. 2. Prior subtotal colectomy with ileocolic anastomosis in the right hemipelvis. 3. Cholelithiasis without evidence of acute cholecystitis. Aortic Atherosclerosis (ICD10-I70.0). Electronically Signed   By: Mannie Seek M.D.   On: 05/28/2024 14:02   Patient Profile:  Charles Santos is a 53 y.o. 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 Admitted 05/28/2024 with melena and bright red hematochezia, UGI bleed suspected.   Assessment / Plan:  53 year old male with a history of GERD, Barrett's esophagus and a duodenal adenoma presenting with melena and bright red hematochezia. UGI bleed suspected. Hg 12.9 (baseline Hg 15.6) -> 11.7 -> today Hg 12.1. BUN 26 -> 16. CTA negative for active GI bleed. On  Naproxen  500mg  bid x 1 year for neck/back/sciatica pain. His most recent surveillance EGD 02/2024 showed a 2 cm hiatal hernia, Barrett's esophagus and the duodenal bulb was unremarkable. No further melena or red hematochezia overnight or thus far this am. Hemodynamically stable.  -NPO -IV fluids per the hospitalist  -Patient to proceed with EGD today with Dr. 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  Bright red hematochezia x 3 episodes on 6/14.  His most recent flexible sigmoidoscopy 02/2024 showed a patent functional end-to-end ileocolonic anastomosis and nonbleeding internal hemorrhoids. -Consider flex sig  6/18 if EGD unrevealing    Alcohol use disorder -Patient counseled to continue to reduce alcohol intake with goal of complete alcohol cessation        Principal Problem:   Acute lower GI bleeding Active Problems:   Hematochezia   Hypertriglyceridemia   Essential hypertension   Melena   GERD (gastroesophageal reflux disease)     LOS: 0 days   Tory Freiberg  05/29/2024, 8:33 AM

## 2024-05-29 NOTE — Plan of Care (Signed)

## 2024-05-29 NOTE — Interval H&P Note (Signed)
 History and Physical Interval Note:  05/29/2024 1:47 PM  Charles Santos  has presented today for surgery, with the diagnosis of UGI bleed, melena.  The various methods of treatment have been discussed with the patient and family. After consideration of risks, benefits and other options for treatment, the patient has consented to  Procedure(s): EGD (ESOPHAGOGASTRODUODENOSCOPY) (N/A) as a surgical intervention.  The patient's history has been reviewed, patient examined, no change in status, stable for surgery.  I have reviewed the patient's chart and labs.  Questions were answered to the patient's satisfaction.     Laquetta Plank Trinnity Breunig

## 2024-05-29 NOTE — Progress Notes (Signed)
 Patient swallowed Givens Capsule pillcam without difficulty today at 3pm. He will remain NPO for 2 hours. At 5pm, patient may have clear liquids. At 7pm, patient may have a light snack. At 11pm, patient may resume previously ordered diet.  The leads and monitor can be removed at 3am and placed in a patient belonging bag. An endoscopy team member will pick these up in the morning.

## 2024-05-29 NOTE — Op Note (Signed)
 [facility] Patient Name: Charles Santos Procedure Date: 05/29/2024 MRN: [id number] Attending MD: Harry Lindau , MD, [id number] Date of Birth: 06/29/1971 CSN: [id number] Age: 53 Admit Type: Inpatient Procedure:                Upper GI endoscopy w/ biopsy Indications:              Acute post hemorrhagic anemia, Melena Providers:                Harry Lindau, MD, Suzann Ernst, RN, Judith Novak,                            Technician Referring MD:              Medicines:                Monitored Anesthesia Care Complications:            No immediate complications. Estimated Blood Loss:     Estimated blood loss was minimal. Procedure:                Pre-Anesthesia Assessment:                           - Prior to the procedure, a History and Physical                            was performed, and patient medications and                            allergies were reviewed. The patient's tolerance of                            previous anesthesia was also reviewed. The risks                            and benefits of the procedure and the sedation                            options and risks were discussed with the patient.                            All questions were answered, and informed consent                            was obtained. Prior Anticoagulants: The patient has                            taken no anticoagulant or antiplatelet agents. ASA                            Grade Assessment: II - A patient with mild systemic                            disease. After reviewing the risks and benefits,  the patient was deemed in satisfactory condition to                            undergo the procedure.                           After obtaining informed consent, the endoscope was                            passed under direct vision. Throughout the                            procedure, the patient's blood pressure, pulse, and                             oxygen saturations were monitored continuously. The                            [id number]-H190 ([id number]) Olympus endoscope was introduced                            through the mouth, and advanced to the third part                            of duodenum. The upper GI endoscopy was                            accomplished without difficulty. The patient                            tolerated the procedure well. Scope In: Scope Out: Findings:      There were esophageal mucosal changes classified as Barrett's stage       C2-M3 per Prague criteria present in the lower third of the esophagus.       This was recently biopsied and biopsied today.      The upper third of the esophagus and middle third of the esophagus were       normal.      A 2 cm sliding type hiatal hernia was present.      Mild inflammation characterized by congestion (edema) and erythema was       found in the gastric body and in the gastric antrum. This was recently       biopsied and biopsied today.      Localized mucosal changes characterized by nodularity were found in the       duodenal bulb. This did not appear evident on the most recent upper       endoscopy. Biopsies were taken with a cold forceps for histology.       Estimated blood loss was minimal.      A tattoo was seen in the second portion of the duodenum.      The mucosa in the duodenal bulb, second portion of the duodenum and       third portion of the duodenum was otherwise normal. Impression:               - Esophageal mucosal changes classified as  Barrett's stage C2-M3 per Prague criteria.                           - Normal upper third of esophagus and middle third                            of esophagus.                           - 2 cm hiatal hernia.                           - Gastritis.                           - Mucosal changes in the duodenum. Biopsied.                           - A tattoo was seen in the duodenum.                            - Normal duodenal bulb, second portion of the                            duodenum and third portion of the duodenum.                           - Made several passes from the esophagus through                            the third portion of the duodenum. No active                            bleeding, heme, or high-grade stigmata of bleeding                            found on today's exam. Moderate Sedation:      Not Applicable - Patient had care per Anesthesia. Recommendation:           - Return patient to hospital ward for ongoing care.                           - Given his history of [name] and                            unremarkable findings on today's study from a                            bleeding standpoint, will plan for further small                            bowel interrogation with Video capsule Endoscopy. I                            did review his  prior CTA, CT abdomen/pelvis, along                            with his CT enterography from 2017 I do not see any                            small bowel pathology (i.e. stricture) that would                            preclude VCE placement. Of note, fully expect to                            see some blood in the proximal small bowel from                            today's biopsy on that VCE.                           - VCE to be placed in recovery.                           - NPO for 2 hours.                           - Continue present medications.                           - Await pathology results.                           - Continue serial CBC checks.                           - Inpatient GI service will continue to follow. Procedure Code(s):        --- Professional ---                           [phone], Esophagogastroduodenoscopy, flexible,                            transoral; with biopsy, single or multiple Diagnosis Code(s):        --- Professional ---                           K22.70, Barrett's  esophagus without dysplasia                           K44.9, Diaphragmatic hernia without obstruction or                            gangrene                           K29.70, Gastritis, unspecified, without bleeding  K31.89, Other diseases of stomach and duodenum                           D62, Acute posthemorrhagic anemia                           K92.1, Melena (includes Hematochezia) CPT copyright 2022 [employer] Association. All rights reserved. The codes documented in this report are preliminary and upon coder review may  be revised to meet current compliance requirements. Harry Lindau, MD 05/29/2024 2:21:58 PM Number of Addenda: 0

## 2024-05-29 NOTE — Progress Notes (Signed)
  Progress Note   Patient: Charles Santos ZOX:[id number] DOB: Sep 13, 1971 DOA: 05/28/2024     0 DOS: the patient was seen and examined on 05/29/2024   Brief hospital course: 53 y.o. male with medical history significant of acute low back pain with bilateral sciatica, anxiety, depression, osteoarthritis, colon cancer, partial colectomy, history of chemotherapy, lung emphysema, GERD, headache, inguinal hernia, hyperlipidemia, hypertension, pneumonia, vitamin D  deficiency who presented to the emergency department with a history of multiple daily melanotic stools and rectal bleeding since Thursday.  No   Assessment and Plan:  Acute lower GI bleeding Hematochezia/melena - No further episodes of hematochezia or melena this morning. Hemoglobin level at 12.1. Continue PPI twice daily CT angio renal scan showed no evidence of acute bleeding Patient is scheduled for EGD today.  Hypertriglyceridemia Will hold fenofibrate  for now.     Essential hypertension Hold diuretic. Monitor blood pressure.     Gastroesophageal reflux disease  On pantoprazole  as above.  Subjective: Seen at bedside this morning, he reports having a sharp abdominal pain this morning which resolved on its own.  Has any further episodes of melena/hematochezia.  Hemoglobin remained stable at 12.  Physical Exam: Vitals:   05/28/24 1955 05/29/24 0019 05/29/24 0520 05/29/24 1253  BP: (!) 133/55 132/68 120/73 132/62  Pulse: (!) 56 (!) 57 60   Resp: 18 17 17 17   Temp: (!) 97.5 F (36.4 C) 98.3 F (36.8 C) 98.2 F (36.8 C) (!) 97.3 F (36.3 C)  TempSrc: Oral Oral Oral Temporal  SpO2: 100% 98% 96% 97%  Weight:    104.3 kg  Height:    5' 10 (1.778 m)  Vitals and nursing note reviewed.  Constitutional:      General: He is not in acute distress.    Appearance: Normal appearance. He is well-developed. He is not ill-appearing.  HENT:     Head: Normocephalic and atraumatic.   Eyes:     Conjunctiva/sclera: Conjunctivae normal.    Cardiovascular:     Rate and Rhythm: Normal rate and regular rhythm.     Heart sounds: Normal heart sounds. No murmur heard. Pulmonary:     Effort: Pulmonary effort is normal. No respiratory distress.     Breath sounds: Normal breath sounds.  Abdominal:     General: There is no distension.     Palpations: Abdomen is soft. There is no mass.     Tenderness: There is no abdominal tenderness.     Hernia: No hernia is present.    Musculoskeletal:        General: No swelling.     Cervical back: Neck supple.   Skin:    General: Skin is warm and dry.     Capillary Refill: Capillary refill takes less than 2 seconds.   Neurological:     General: No focal deficit present.     Mental Status: He is alert.   Psychiatric:        Mood and Affect: Mood normal.        Time spent: 35 minutes  Author: Volney Grumbles, MD 05/29/2024 1:03 PM  For on call review [email or web address].

## 2024-05-29 NOTE — Progress Notes (Addendum)
 Patient given Sprite and a small cup of chicken broth after 5 PM.

## 2024-05-29 NOTE — Progress Notes (Signed)
 [facility] Progress Note  CC:  Black stools and bright red hematochezia     Subjective: No further melena or hematochezia overnight. He had brief left mid/LLQ pain this am lasted for a few seconds then abated without recurrence. No CP or SOB. Remains NPO for EGD this afternoon.    Objective:  Vital signs in last 24 hours: Temp:  [97.5 F (36.4 C)-98.7 F (37.1 C)] 98.2 F (36.8 C) (06/17 0520) Pulse Rate:  [56-73] 60 (06/17 0520) Resp:  [13-18] 17 (06/17 0520) BP: (115-157)/(55-83) 120/73 (06/17 0520) SpO2:  [96 %-100 %] 96 % (06/17 0520) Weight:  [104.3 kg] 104.3 kg (06/16 1107) Last BM Date : 05/28/24 General: Alert 53 year old male in NAD. Heart: Regular rate and rhythm, no murmur.  Pulm: Breath sounds clear throughout.  Abdomen: Soft, nondistended. Nontender. Positive bowel sounds x 4 quadrants. Midline abd scar intact. No bruit.  Extremities:  No edema. Neurologic:  Alert and oriented x 5. Speech is clear. Moves all extremities equally.  Psych:  Alert and cooperative. Normal mood and affect.  Intake/Output from previous day: 06/16 0701 - 06/17 0700 In: 1743.7 [P.O.:860; I.V.:883.7] Out: 600 [Stool:600] Intake/Output this shift: No intake/output data recorded.  Lab Results: Recent Labs    05/28/24 1143 05/28/24 1632 05/28/24 2120 05/29/24 0449  WBC 6.2  --   --  5.5  HGB 12.9* 12.6* 11.7* 12.1*  HCT 38.0* 38.0* 35.6* 37.4*  PLT 196  --   --  184   BMET Recent Labs    05/28/24 1143 05/29/24 0449  NA 138 139  K 3.5 3.7  CL 104 105  CO2 25 24  GLUCOSE 111* 110*  BUN 26* 16  CREATININE 0.92 0.89  CALCIUM  8.9 8.9   LFT Recent Labs    05/29/24 0449  PROT 6.1*  ALBUMIN 3.2*  AST 34  ALT 33  ALKPHOS 44  BILITOT 0.6   PT/INR No results for input(s): LABPROT, INR in the last 72 hours. Hepatitis Panel No results for input(s): HEPBSAG, HCVAB, HEPAIGM, HEPBIGM in the last 72 hours.  CT ANGIO GI BLEED Result Date:  05/28/2024 CLINICAL DATA:  Dark red rectal bleeding for 4 days. Weakness. History colon cancer. EXAM: CTA ABDOMEN AND PELVIS WITHOUT AND WITH CONTRAST TECHNIQUE: Multidetector CT imaging of the abdomen and pelvis was performed using the standard protocol during bolus administration of intravenous contrast. Multiplanar reconstructed images and MIPs were obtained and reviewed to evaluate the vascular anatomy. RADIATION DOSE REDUCTION: This exam was performed according to the departmental dose-optimization program which includes automated exposure control, adjustment of the mA and/or kV according to patient size and/or use of iterative reconstruction technique. CONTRAST:  OMNIPAQUE  IOHEXOL  350 MG/ML SOLN COMPARISON:  CT abdomen/pelvis dated 11/25/2023. FINDINGS: VASCULAR Aorta: Normal caliber aorta without aneurysm, dissection, or significant stenosis. Scattered aortic atherosclerosis. Celiac: Patent without evidence of aneurysm, dissection, or significant stenosis. SMA: Patent without evidence of aneurysm, dissection, or significant stenosis. Renals: Patent without evidence of aneurysm, dissection, or significant stenosis. IMA: Patent without evidence of aneurysm, dissection, or significant stenosis. Inflow: Patent without evidence of aneurysm, dissection, or significant stenosis. Proximal Outflow: Bilateral common femoral and visualized portions of the superficial and profunda femoral arteries are patent without evidence of aneurysm, dissection, or significant stenosis. Veins: Patent without appreciable abnormality. Review of the MIP images confirms the above findings. NON-VASCULAR Lower chest: No acute abnormality. Hepatobiliary: No focal liver abnormality is seen. Small calcified gallstone. No gallbladder wall thickening or  pericholecystic fluid. No biliary dilatation. Pancreas: Unremarkable. No pancreatic ductal dilatation or surrounding inflammatory changes. Spleen: Normal in size without focal abnormality.  Adrenals/Urinary Tract: Adrenal glands are unremarkable. Kidneys are normal, without renal calculi, focal lesion, or hydronephrosis. Bladder is unremarkable. Stomach/Bowel: Stomach is within normal limits. No evidence of bowel obstruction. Prior subtotal colectomy with ileocolic anastomosis in the right hemipelvis. Anastomosis appears intact. Following intravenous contrast administration, there is no intraluminal contrast extravasation to suggest active bleeding on CT. Lymphatic: No pathologically enlarged abdominal or pelvic lymph nodes. Reproductive: Prostate is unremarkable. Other: No abdominopelvic ascites.  No intraperitoneal free air. Musculoskeletal: Posterior fusion at L4-L5. No acute osseous abnormality or suspicious osseous lesion. IMPRESSION: VASCULAR No evidence of active GI bleeding on CT. NON-VASCULAR 1. No acute localizing findings in the abdomen or pelvis. 2. Prior subtotal colectomy with ileocolic anastomosis in the right hemipelvis. 3. Cholelithiasis without evidence of acute cholecystitis. Aortic Atherosclerosis (ICD10-I70.0). Electronically Signed   By: Mannie Seek M.D.   On: 05/28/2024 14:02   Patient Profile:  Charles Santos is a 53 y.o. 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 Admitted 05/28/2024 with melena and bright red hematochezia, UGI bleed suspected.   Assessment / Plan:  53 year old male with a history of GERD, Barrett's esophagus and a duodenal adenoma presenting with melena and bright red hematochezia. UGI bleed suspected. Hg 12.9 (baseline Hg 15.6) -> 11.7 -> today Hg 12.1. BUN 26 -> 16. CTA negative for active GI bleed. On  Naproxen  500mg  bid x 1 year for neck/back/sciatica pain. His most recent surveillance EGD 02/2024 showed a 2 cm hiatal hernia, Barrett's esophagus and the duodenal bulb was unremarkable. No further melena or red hematochezia overnight or thus far this am. Hemodynamically stable.  -NPO -IV fluids per the hospitalist  -Patient to proceed with EGD today with Dr. Karene Oto -EGD benefits and risks discussed including risk with sedation, risk of bleeding, perforation and infection  -Continue Pantoprazole  40 mg IV twice daily -CBC in am -Transfuse for Hg <  less than 7 or as needed if symptomatic -No NSAIDs    History of [name] diagnosed with colon cancer at the age of 85 then adenocarcinoma in the transverse colon in 2017 stage II s/p subtotal colectomy 09/2016.  Bright red hematochezia x 3 episodes on 6/14.  His most recent flexible sigmoidoscopy 02/2024 showed a patent functional end-to-end ileocolonic anastomosis and nonbleeding internal hemorrhoids. -Consider flex sig  6/18 if EGD unrevealing    Alcohol use disorder -Patient counseled to continue to reduce alcohol intake with goal of complete alcohol cessation        Principal Problem:   Acute lower GI bleeding Active Problems:   Hematochezia   Hypertriglyceridemia   Essential hypertension   Melena   GERD (gastroesophageal reflux disease)     LOS: 0 days   Charles Santos  05/29/2024, 8:33 AM

## 2024-05-29 NOTE — Anesthesia Preprocedure Evaluation (Addendum)
 Anesthesia Evaluation  Patient identified by MRN, date of birth, ID band Patient awake    Reviewed: Allergy & Precautions, NPO status , Patient's Chart, lab work & pertinent test results  Airway Mallampati: II  TM Distance: >3 FB Neck ROM: Full    Dental no notable dental hx.    Pulmonary COPD, former smoker   Pulmonary exam normal        Cardiovascular hypertension, Normal cardiovascular exam     Neuro/Psych  Headaches PSYCHIATRIC DISORDERS Anxiety Depression     Neuromuscular disease    GI/Hepatic Neg liver ROS,GERD  Medicated and Controlled,,  Endo/Other  negative endocrine ROS    Renal/GU negative Renal ROS     Musculoskeletal  (+) Arthritis ,    Abdominal  (+) + obese  Peds  Hematology  (+) Blood dyscrasia, anemia   Anesthesia Other Findings UGI bleed Melena  Reproductive/Obstetrics                             Anesthesia Physical Anesthesia Plan  ASA: 2  Anesthesia Plan: MAC   Post-op Pain Management:    Induction:   PONV Risk Score and Plan: 1 and Propofol  infusion and Treatment may vary due to age or medical condition  Airway Management Planned: Nasal Cannula  Additional Equipment:   Intra-op Plan:   Post-operative Plan:   Informed Consent: I have reviewed the patients History and Physical, chart, labs and discussed the procedure including the risks, benefits and alternatives for the proposed anesthesia with the patient or authorized representative who has indicated his/her understanding and acceptance.     Dental advisory given  Plan Discussed with: CRNA  Anesthesia Plan Comments:        Anesthesia Quick Evaluation

## 2024-05-29 NOTE — Transfer of Care (Signed)
 Immediate Anesthesia Transfer of Care Note  Patient: Charles Santos  Procedure(s) Performed: EGD (ESOPHAGOGASTRODUODENOSCOPY)  Patient Location: Endoscopy Unit  Anesthesia Type:MAC  Level of Consciousness: drowsy and patient cooperative  Airway & Oxygen Therapy: Patient Spontanous Breathing  Post-op Assessment: Report given to RN and Post -op Vital signs reviewed and stable  Post vital signs: Reviewed and stable  Last Vitals:  Vitals Value Taken Time  BP    Temp    Pulse 70 05/29/24 14:14  Resp 18 05/29/24 14:14  SpO2 97 % 05/29/24 14:14  Vitals shown include unfiled device data.  Last Pain:  Vitals:   05/29/24 1253  TempSrc: Temporal  PainSc: 0-No pain      Patients Stated Pain Goal: 0 (05/29/24 1253)  Complications: No notable events documented.

## 2024-05-30 ENCOUNTER — Telehealth: Payer: Self-pay

## 2024-05-30 DIAGNOSIS — K625 Hemorrhage of anus and rectum: Secondary | ICD-10-CM

## 2024-05-30 DIAGNOSIS — K921 Melena: Secondary | ICD-10-CM | POA: Diagnosis not present

## 2024-05-30 DIAGNOSIS — K633 Ulcer of intestine: Secondary | ICD-10-CM | POA: Diagnosis not present

## 2024-05-30 LAB — CBC
HCT: 36.6 % — ABNORMAL LOW (ref 39.0–52.0)
Hemoglobin: 12 g/dL — ABNORMAL LOW (ref 13.0–17.0)
MCH: 32.2 pg (ref 26.0–34.0)
MCHC: 32.8 g/dL (ref 30.0–36.0)
MCV: 98.1 fL (ref 80.0–100.0)
Platelets: 190 10*3/uL (ref 150–400)
RBC: 3.73 MIL/uL — ABNORMAL LOW (ref 4.22–5.81)
RDW: 13.7 % (ref 11.5–15.5)
WBC: 7.3 10*3/uL (ref 4.0–10.5)
nRBC: 0 % (ref 0.0–0.2)

## 2024-05-30 LAB — SURGICAL PATHOLOGY

## 2024-05-30 MED ORDER — PANTOPRAZOLE SODIUM 40 MG PO TBEC: 40.0000 mg | DELAYED_RELEASE_TABLET | Freq: Two times a day (BID) | ORAL | 5 refills | Status: DC

## 2024-05-30 NOTE — Progress Notes (Addendum)
 [facility] Progress Note  CC: Black stools and bright red hematochezia   Subjective: He passed a brown loose loose stool yesterday afternoon which is consistent with his normal bowel pattern. No bloody or black stools. No abdominal pain. No nausea or vomiting. No chest pain or shortness of breath. He endorses having right hip and right leg pain which is chronic.   Objective:   EGD 05/29/2024: - Esophageal mucosal changes classified as Barrett's stage C2-M3 per Prague criteria. - Normal upper third of esophagus and middle third of esophagus.  - 2 cm hiatal hernia.  - Gastritis.  - Mucosal changes in the duodenum. Biopsied.  - A tattoo was seen in the duodenum.  - Normal duodenal bulb, second portion of the duodenum and third portion of the duodenum.  - Made several passes from the esophagus through the third portion of the duodenum. No active bleeding, heme, or high-grade stigmata of bleeding found on today's exam.   Vital signs in last 24 hours: Temp:  [97.3 F (36.3 C)-98.3 F (36.8 C)] 98.3 F (36.8 C) (06/18 0423) Pulse Rate:  [59-70] 64 (06/18 0423) Resp:  [12-19] 16 (06/18 0423) BP: (110-132)/(48-64) 118/59 (06/18 0423) SpO2:  [96 %-100 %] 100 % (06/18 0423) Weight:  [104.3 kg] 104.3 kg (06/17 1457) Last BM Date : 05/28/24  General: Alert and oriented in no acute distress. Heart: Regular rate and rhythm, no murmurs. Pulm: Breath sounds clear throughout. Abdomen: Soft, nondistended.  Nontender.  Positive bowel sounds to all 4 quadrants.  Midline abdominal scar intact. Extremities: No edema. Neurologic:  Alert and  oriented x 4. Grossly normal neurologically. Psych:  Alert and cooperative. Normal mood and affect.  Intake/Output from previous day: 06/17 0701 - 06/18 0700 In: 811.3 [P.O.:400; I.V.:411.3] Out: -  Intake/Output this shift: No intake/output data recorded.  Lab Results: Recent Labs    05/28/24 1143 05/28/24 1632 05/28/24 2120  05/29/24 0449 05/30/24 0409  WBC 6.2  --   --  5.5 7.3  HGB 12.9*   < > 11.7* 12.1* 12.0*  HCT 38.0*   < > 35.6* 37.4* 36.6*  PLT 196  --   --  184 190   < > = values in this interval not displayed.   BMET Recent Labs    05/28/24 1143 05/29/24 0449  NA 138 139  K 3.5 3.7  CL 104 105  CO2 25 24  GLUCOSE 111* 110*  BUN 26* 16  CREATININE 0.92 0.89  CALCIUM  8.9 8.9   LFT Recent Labs    05/29/24 0449  PROT 6.1*  ALBUMIN 3.2*  AST 34  ALT 33  ALKPHOS 44  BILITOT 0.6     CT ANGIO GI BLEED Result Date: 05/28/2024 CLINICAL DATA:  Dark red rectal bleeding for 4 days. Weakness. History colon cancer. EXAM: CTA ABDOMEN AND PELVIS WITHOUT AND WITH CONTRAST TECHNIQUE: Multidetector CT imaging of the abdomen and pelvis was performed using the standard protocol during bolus administration of intravenous contrast. Multiplanar reconstructed images and MIPs were obtained and reviewed to evaluate the vascular anatomy. RADIATION DOSE REDUCTION: This exam was performed according to the departmental dose-optimization program which includes automated exposure control, adjustment of the mA and/or kV according to patient size and/or use of iterative reconstruction technique. CONTRAST:  OMNIPAQUE  IOHEXOL  350 MG/ML SOLN COMPARISON:  CT abdomen/pelvis dated 11/25/2023. FINDINGS: VASCULAR Aorta: Normal caliber aorta without aneurysm, dissection, or significant stenosis. Scattered aortic atherosclerosis. Celiac: Patent without evidence of aneurysm, dissection, or significant  stenosis. SMA: Patent without evidence of aneurysm, dissection, or significant stenosis. Renals: Patent without evidence of aneurysm, dissection, or significant stenosis. IMA: Patent without evidence of aneurysm, dissection, or significant stenosis. Inflow: Patent without evidence of aneurysm, dissection, or significant stenosis. Proximal Outflow: Bilateral common femoral and visualized portions of the superficial and profunda  femoral arteries are patent without evidence of aneurysm, dissection, or significant stenosis. Veins: Patent without appreciable abnormality. Review of the MIP images confirms the above findings. NON-VASCULAR Lower chest: No acute abnormality. Hepatobiliary: No focal liver abnormality is seen. Small calcified gallstone. No gallbladder wall thickening or pericholecystic fluid. No biliary dilatation. Pancreas: Unremarkable. No pancreatic ductal dilatation or surrounding inflammatory changes. Spleen: Normal in size without focal abnormality. Adrenals/Urinary Tract: Adrenal glands are unremarkable. Kidneys are normal, without renal calculi, focal lesion, or hydronephrosis. Bladder is unremarkable. Stomach/Bowel: Stomach is within normal limits. No evidence of bowel obstruction. Prior subtotal colectomy with ileocolic anastomosis in the right hemipelvis. Anastomosis appears intact. Following intravenous contrast administration, there is no intraluminal contrast extravasation to suggest active bleeding on CT. Lymphatic: No pathologically enlarged abdominal or pelvic lymph nodes. Reproductive: Prostate is unremarkable. Other: No abdominopelvic ascites.  No intraperitoneal free air. Musculoskeletal: Posterior fusion at L4-L5. No acute osseous abnormality or suspicious osseous lesion. IMPRESSION: VASCULAR No evidence of active GI bleeding on CT. NON-VASCULAR 1. No acute localizing findings in the abdomen or pelvis. 2. Prior subtotal colectomy with ileocolic anastomosis in the right hemipelvis. 3. Cholelithiasis without evidence of acute cholecystitis. Aortic Atherosclerosis (ICD10-I70.0). Electronically Signed   By: Mannie Seek M.D.   On: 05/28/2024 14:02   Patient Profile:  Charles Santos is a 53 y.o. 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 Admitted 05/28/2024 with melena and bright red hematochezia, UGI bleed suspected  Assessment / Plan:  53 year old male with a history of GERD, Barrett's esophagus and a duodenal adenoma presenting with melena and bright red hematochezia. UGI bleed suspected. Hg 12.9 (baseline Hg 15.6) -> 11.7 -> Hg 12.1 -> today Hg 12. BUN 26 -> 16. CTA negative for active GI bleed. On Naproxen  500mg  bid x 1 year for neck/back/sciatica pain. EGD 6/17 showed a 2 cm hiatal hernia, evidence of Barrett's esophagus, gastritis and mucosal changes in the duodenum without stigmata or recent bleeding. VCE was placed, results pending. No further melena or hematochezia overnight or thus far this am. Hemodynamically stable.  -Await small bowel video capsule endoscopy results -Await EGD path result  -Heart healthy diet -Change PPI IV bid to po bid -CBC in am -Transfuse for Hg <  less than 7 or as needed if symptomatic -No NSAIDs    History of [name] diagnosed with colon cancer at the age of 27 then adenocarcinoma in the transverse colon in 2017 stage II s/p subtotal colectomy 09/2016.  Melena and hematochezia episodes on 6/14.  His most recent flexible sigmoidoscopy 02/2024 showed a patent functional end-to-end ileocolonic anastomosis and nonbleeding internal hemorrhoids. -Flex sig deferred for now    Alcohol use disorder -Patient counseled to continue to reduce alcohol intake with goal of complete alcohol cessation  Right hip and leg pain, chronic  -Management per the hospitalist  LOS: 0 days   Tory Freiberg  05/30/2024, 8:44 AM    [facility] Attending   I have taken an interval history, reviewed the chart and examined the patient. I agree with the Advanced Practitioner's note, impression and recommendations with the  following additions:   Capsule endoscopy revealed 2 possible ileal  lesions and a small terminal ileal ulcer  The possible lesions could be debris/food. Small ulcer not likely a source.  Bleeding appears to have stopped.  I think he can dc and we will coordinate a repeat capsule endoscopy w/ prep as outpatient.  He should avoid NSAIDs  Kenney Peacemaker, MD, [facility] See Tilford Foley on call - gastroenterology for best contact person 05/30/2024 3:24 PM

## 2024-05-30 NOTE — Discharge Summary (Signed)
 Physician Discharge Summary   Patient: Charles Santos MRN: [id number] DOB: 03-02-1971  Admit date:     05/28/2024  Discharge date: 05/30/24  Discharge Physician: Vernona Goods Milarose Savich   PCP: Roise Cleaver, MD   Recommendations at discharge:      Discharge Diagnoses: Principal Problem:   Acute lower GI bleeding Active Problems:   Hematochezia   Hypertriglyceridemia   Essential hypertension   Melena   GERD (gastroesophageal reflux disease)   ABLA (acute blood loss anemia)   Gastritis and gastroduodenitis   Barrett's esophagus without dysplasia   History of colon cancer  Resolved Problems:   * No resolved hospital problems. *  Hospital Course: 53 y.o. male with medical history significant of acute low back pain with bilateral sciatica, anxiety, depression, osteoarthritis, colon cancer, partial colectomy, history of chemotherapy, lung emphysema, GERD, headache, inguinal hernia, hyperlipidemia, hypertension, pneumonia, vitamin D  deficiency who presented to the emergency department with a history of multiple daily melanotic stools and rectal bleeding since Thursday.   Assessment and Plan: Acute lower GI bleeding Hematochezia/melena - No further episodes of hematochezia or melena this morning. Hemoglobin level at 12.1. Continue PPI po twice daily CT angio bleed scan showed no evidence of acute bleeding  EGD 05/29/2024: - Esophageal mucosal changes classified as Barrett's stage C2-M3 per Prague criteria. - Normal upper third of esophagus and middle third of esophagus.  - 2 cm hiatal hernia.  - Gastritis.  - Mucosal changes in the duodenum. Biopsied.  - A tattoo was seen in the duodenum.  - Normal duodenal bulb, second portion of the duodenum and third portion of the duodenum.  - Made several passes from the esophagus through the third portion of the duodenum. No active bleeding, heme, or high-grade stigmata of bleeding   No further melena or hematochezia overnight and morning. Hb  is stable at 12.0  Video capsule Endoscopy revealed 2 possible ileal  lesions and a small terminal ileal ulcer   The possible lesions could be debris/food. Small ulcer not likely a source.   Bleeding appears to have stopped.   Per GI pt can dc and we will coordinate a repeat capsule endoscopy w/ prep as outpatient.   He should avoid NSAIDs  Hypertriglyceridemia cont fenofibrate  for now.     Essential hypertension Stable  Monitor blood pressure.     Gastroesophageal reflux disease  On pantoprazole  as above.          Consultants: GI Procedures performed: egd, vce  Disposition: Home Diet recommendation:  Regular diet DISCHARGE MEDICATION:Pain control - [place]  Controlled Substance Reporting System database was reviewed. and patient was instructed, not to drive, operate heavy machinery, perform activities at heights, swimming or participation in water activities or provide baby-sitting services while on Pain, Sleep and Anxiety Medications; until their outpatient Physician has advised to do so again. Also recommended to not to take more than prescribed Pain, Sleep and Anxiety Medications.  Allergies as of 05/30/2024       Reactions   Hydrocodone     Latex    Oxycodone  Other (See Comments)   Made pt feel like a ZOMBIE   Adhesive [tape] Rash, Other (See Comments)   Prefers paper tape        Medication List     STOP taking these medications    naproxen  500 MG tablet Commonly known as: NAPROSYN        TAKE these medications    acetaminophen  500 MG tablet Commonly known as: TYLENOL  Take 500-1,000 mg by mouth  every 6 (six) hours as needed for moderate pain (pain score 4-6).   fenofibrate  160 MG tablet Take 1 tablet (160 mg total) by mouth daily. For cholesterol and triglyceride   Fish Oil 1200 MG Caps Take 1,200 mg by mouth See admin instructions. 1am 2apm   multivitamin with minerals Tabs tablet Take 1 tablet by mouth daily.   pantoprazole  40 MG  tablet Commonly known as: PROTONIX  Take 1 tablet (40 mg total) by mouth 2 (two) times daily. What changed: when to take this   pregabalin  200 MG capsule Commonly known as: LYRICA  Take 1 capsule (200 mg total) by mouth at bedtime.   QUEtiapine  100 MG tablet Commonly known as: SEROQUEL  Take 1 tablet (100 mg total) by mouth at bedtime. What changed:  when to take this reasons to take this   rosuvastatin  20 MG tablet Commonly known as: CRESTOR  Take 1 tablet (20 mg total) by mouth daily.   triamterene -hydrochlorothiazide 37.5-25 MG tablet Commonly known as: MAXZIDE-25 TAKE ONE TABLET DAILY FOR BLOOD PRESSURE AND fluid        Discharge Exam: Filed Weights   05/28/24 1107 05/29/24 1253 05/29/24 1457  Weight: 104.3 kg 104.3 kg 104.3 kg  General: Alert and oriented in no acute distress. CV: Regular rate and rhythm, no murmurs. Pulm: Breath sounds clear throughout. Abdomen: Soft, nondistended.  Nontender.  Positive bowel sounds  Extremities: No edema. Neurologic:  Alert and  oriented x 4. Grossly normal neurologically. Psych:   Normal mood and affect.     Condition at discharge: good  The results of significant diagnostics from this hospitalization (including imaging, microbiology, ancillary and laboratory) are listed below for reference.   Imaging Studies: CT ANGIO GI BLEED Result Date: 05/28/2024 CLINICAL DATA:  Dark red rectal bleeding for 4 days. Weakness. History colon cancer. EXAM: CTA ABDOMEN AND PELVIS WITHOUT AND WITH CONTRAST TECHNIQUE: Multidetector CT imaging of the abdomen and pelvis was performed using the standard protocol during bolus administration of intravenous contrast. Multiplanar reconstructed images and MIPs were obtained and reviewed to evaluate the vascular anatomy. RADIATION DOSE REDUCTION: This exam was performed according to the departmental dose-optimization program which includes automated exposure control, adjustment of the mA and/or kV according  to patient size and/or use of iterative reconstruction technique. CONTRAST:  OMNIPAQUE  IOHEXOL  350 MG/ML SOLN COMPARISON:  CT abdomen/pelvis dated 11/25/2023. FINDINGS: VASCULAR Aorta: Normal caliber aorta without aneurysm, dissection, or significant stenosis. Scattered aortic atherosclerosis. Celiac: Patent without evidence of aneurysm, dissection, or significant stenosis. SMA: Patent without evidence of aneurysm, dissection, or significant stenosis. Renals: Patent without evidence of aneurysm, dissection, or significant stenosis. IMA: Patent without evidence of aneurysm, dissection, or significant stenosis. Inflow: Patent without evidence of aneurysm, dissection, or significant stenosis. Proximal Outflow: Bilateral common femoral and visualized portions of the superficial and profunda femoral arteries are patent without evidence of aneurysm, dissection, or significant stenosis. Veins: Patent without appreciable abnormality. Review of the MIP images confirms the above findings. NON-VASCULAR Lower chest: No acute abnormality. Hepatobiliary: No focal liver abnormality is seen. Small calcified gallstone. No gallbladder wall thickening or pericholecystic fluid. No biliary dilatation. Pancreas: Unremarkable. No pancreatic ductal dilatation or surrounding inflammatory changes. Spleen: Normal in size without focal abnormality. Adrenals/Urinary Tract: Adrenal glands are unremarkable. Kidneys are normal, without renal calculi, focal lesion, or hydronephrosis. Bladder is unremarkable. Stomach/Bowel: Stomach is within normal limits. No evidence of bowel obstruction. Prior subtotal colectomy with ileocolic anastomosis in the right hemipelvis. Anastomosis appears intact. Following intravenous contrast administration, there is  no intraluminal contrast extravasation to suggest active bleeding on CT. Lymphatic: No pathologically enlarged abdominal or pelvic lymph nodes. Reproductive: Prostate is unremarkable. Other: No  abdominopelvic ascites.  No intraperitoneal free air. Musculoskeletal: Posterior fusion at L4-L5. No acute osseous abnormality or suspicious osseous lesion. IMPRESSION: VASCULAR No evidence of active GI bleeding on CT. NON-VASCULAR 1. No acute localizing findings in the abdomen or pelvis. 2. Prior subtotal colectomy with ileocolic anastomosis in the right hemipelvis. 3. Cholelithiasis without evidence of acute cholecystitis. Aortic Atherosclerosis (ICD10-I70.0). Electronically Signed   By: Mannie Seek M.D.   On: 05/28/2024 14:02    Microbiology: Results for orders placed or performed in visit on 01/27/24  COVID-19, Flu A+B and RSV     Status: None   Collection Time: 01/27/24 10:44 AM   Specimen: Nasopharyngeal(NP) swabs in vial transport medium  Result Value Ref Range Status   SARS-CoV-2, NAA Not Detected Not Detected Final   Influenza A, NAA Not Detected Not Detected Final   Influenza B, NAA Not Detected Not Detected Final   RSV, NAA Not Detected Not Detected Final   Test Information: Comment  Final    Comment: This nucleic acid amplification test was developed and its performance characteristics determined by [employer]. Nucleic acid amplification tests include RT-PCR and TMA. This test has not been FDA cleared or approved. This test has been authorized by FDA under an Emergency Use Authorization (EUA). This test is only authorized for the duration of time the declaration that circumstances exist justifying the authorization of the emergency use of in vitro diagnostic tests for detection of SARS-CoV-2 virus and/or diagnosis of COVID-19 infection under section 564(b)(1) of the Act, 21 U.S.C. 409WJX-9(J) (1), unless the authorization is terminated or revoked sooner. When diagnostic testing is negative, the possibility of a false negative result should be considered in the context of a patient's recent exposures and the presence of clinical signs and symptoms consistent with  COVID-19. An individual without symptoms of COVID-19 and who is not shedding SARS-CoV-2 virus wo uld expect to have a negative (not detected) result in this assay.     Labs: CBC: Recent Labs  Lab 05/28/24 1143 05/28/24 1632 05/28/24 2120 05/29/24 0449 05/30/24 0409  WBC 6.2  --   --  5.5 7.3  HGB 12.9* 12.6* 11.7* 12.1* 12.0*  HCT 38.0* 38.0* 35.6* 37.4* 36.6*  MCV 93.4  --   --  97.1 98.1  PLT 196  --   --  184 190   Basic Metabolic Panel: Recent Labs  Lab 05/28/24 1143 05/29/24 0449  NA 138 139  K 3.5 3.7  CL 104 105  CO2 25 24  GLUCOSE 111* 110*  BUN 26* 16  CREATININE 0.92 0.89  CALCIUM  8.9 8.9   Liver Function Tests: Recent Labs  Lab 05/28/24 1143 05/29/24 0449  AST 38 34  ALT 37 33  ALKPHOS 47 44  BILITOT 0.7 0.6  PROT 6.7 6.1*  ALBUMIN 3.7 3.2*   CBG: No results for input(s): GLUCAP in the last 168 hours.  Discharge time spent: greater than 30 minutes.  Signed: Volney Grumbles, MD [facility] 05/30/2024

## 2024-05-30 NOTE — Anesthesia Postprocedure Evaluation (Signed)
 Anesthesia Post Note  Patient: Charles Santos  Procedure(s) Performed: EGD (ESOPHAGOGASTRODUODENOSCOPY) IMAGING PROCEDURE, GI TRACT, INTRALUMINAL, VIA CAPSULE BIOPSY, SKIN, SUBCUTANEOUS TISSUE, OR MUCOUS MEMBRANE     Patient location during evaluation: Endoscopy Anesthesia Type: MAC Level of consciousness: awake Pain management: pain level controlled Vital Signs Assessment: post-procedure vital signs reviewed and stable Respiratory status: spontaneous breathing, nonlabored ventilation and respiratory function stable Cardiovascular status: blood pressure returned to baseline and stable Postop Assessment: no apparent nausea or vomiting Anesthetic complications: no   No notable events documented.  Last Vitals:  Vitals:   05/29/24 2027 05/30/24 0423  BP: 125/64 (!) 118/59  Pulse: 61 64  Resp: 17 16  Temp: 36.7 C 36.8 C  SpO2: 100% 100%    Last Pain:  Vitals:   05/30/24 0423  TempSrc: Oral  PainSc:                  Tommie Frame Tiaira Arambula

## 2024-05-30 NOTE — Progress Notes (Signed)
 Patient's Given Capsule device stopped blinking light. This nurse called on call endoscopy nurse and said it is fine and instructed this nurse to remove the leads with box. Device was placed in a bag and left at the bedside.

## 2024-05-30 NOTE — Procedures (Signed)
 GIVENS CAPSULE STUDY SMALL INTESTINE  Indication: GI bleeding melena with hematochezia.  EGD did not reveal cause.  EGD just before capsule demonstrated Barrett's esophagus, hiatal hernia, gastritis, and some unknown certain mucosal changes in the duodenum that were biopsied.  Patient has a history of [name] and has had 2 colon resections for 2 colon cancers, status post a subtotal colectomy at this point.  He also has had a duodenal adenoma resected endoscopically in the past.  He has been using NSAIDs regularly.  Gastric passage time 1 hour 9-minutes Small bowel passage time 4 hours 14 minutes Colon passage time 2 hours 13 minutes   Findings:  #1 polypoid lesion versus debris with suspected blood/blood clot at 4 hours and 10 minutes, I think this would be ileum  #2 mucosal changes versus debris at 5 hours, distal ileum  #3 small ulcer at 5 hours and 20 minutes likely terminal ileum, no stigmata of bleeding  #4 fluid and debris in distal small bowel after about 4 hours limits exam  #5 no signs of melena or blood in remaining colon and rectum  #6 nonbleeding internal hemorrhoids   Summary and recommendations:  There are 2 possible lesions 1 with suspected blood/blood clots but uncertain if these are truly lesions or debris/partially digested food.  Small terminal ileal ulcer without signs of bleeding   -----------------------------------------------------------------------------------------------------  At this point brown stool has been reported.  Hemoglobin stable.  Doubt bleeding was from the small ulcer in the terminal ileum though question if he has more of those that were not seen so possible cause.  Since bleeding appears to have stopped him inclined to observe and repeat capsule with prep which can be done as an outpatient.

## 2024-05-30 NOTE — Telephone Encounter (Signed)
-----   Message from Loy Ruff sent at 05/30/2024  3:27 PM EDT ----- Regarding: needs capsule w/ prep Dear POD C RN's  This man will be discharged today or tomorrow.  He had a capsule endoscopy and there were some possible lesions seen but could be due to debris/food in intestine.  He needs to have a capsule endoscopy with a prep as an outpatient. This is not urgent but approximately within next month.  Gabe and Vito FYI  Diagnosis is melena/hematochezia  Thanks  [facility]

## 2024-05-31 ENCOUNTER — Ambulatory Visit: Payer: Self-pay

## 2024-05-31 NOTE — Telephone Encounter (Signed)
 FYI Only or Action Required?: Action required by provider: request for appointment and clinical question for provider.  Patient was last seen in primary care on 03/13/2024 by Roise Cleaver, MD. Called Nurse Triage reporting Medication Question. Symptoms began pain x a month. Interventions attempted: OTC medications: Naproxen --but patient was advised to stop taking this and Rest, hydration, or home remedies. Symptoms are: gradually worsening.  Triage Disposition: See HCP Within 4 Hours (Or PCP Triage)  Patient/caregiver understands and will follow disposition?: No, wishes to speak with PCP     Patient wants to know what else he can take for pain because he was advised to stop taking Naproxen  due to being in the hospital in the past few days for GI bleeding. He also wanted to know about the increase with his antacids. Patient also having right hip pain down the right leg and some right lower back pain that has been going on for a month.              Copied from CRM [id number]. Topic: General - Other >> May 31, 2024 12:20 PM Tiffany H wrote: Patient called back to speak with Triage.  Reason for Disposition  [1] SEVERE pain (e.g., excruciating, unable to do any normal activities) AND [2] not improved after 2 hours of pain medicine  Answer Assessment - Initial Assessment Questions 1. LOCATION and RADIATION: Where is the pain located?      Right hip and down right leg---pain also in lower right side of back 2. QUALITY: What does the pain feel like?  (e.g., sharp, dull, aching, burning)     Just hurts 3. SEVERITY: How bad is the pain? What does it keep you from doing?   (Scale 1-10; or mild, moderate, severe)   -  MILD (1-3): doesn't interfere with normal activities    -  MODERATE (4-7): interferes with normal activities (e.g., work or school) or awakens from sleep, limping    -  SEVERE (8-10): excruciating pain, unable to do any normal activities, unable to walk     8 4.  ONSET: When did the pain start? Does it come and go, or is it there all the time?     About a month ago 5. WORK OR EXERCISE: Has there been any recent work or exercise that involved this part of the body?      Daily use 6. CAUSE: What do you think is causing the hip pain?      unknown 7. AGGRAVATING FACTORS: What makes the hip pain worse? (e.g., walking, climbing stairs, running)     Walking around helps a little but not alot 8. OTHER SYMPTOMS: Do you have any other symptoms? (e.g., back pain, pain shooting down leg,  fever, rash)     Lower right sided back pain and pain down the right leg    With no appointments available at the PCP office with patient's provider or any other providers---Patient is advised that Urgent Care is another option.  Patient didn't want to go to Urgent Care and states that he wants to see what his PCP Dr Roise Cleaver advises because he doesn't want to take anything that might interfere with his other medications.  Patient is advised that if anything gets worse to go to the Emergency Room. He verbalized understanding of this.  Answer Assessment - Initial Assessment Questions Patient went to the hospital on 05/28/2024 and was released yesterday 05/30/2024. ---there for GI bleeding. Patient will be going back in as an  outpatient for further evaluation Patient was advised to quit taking Naproxen  and they also wanted him to increase his antacid medication but he hasn't heard anything from the pharmacy about it. Patient wants to know what he can take for pain that won't cause bleeding.  Protocols used: Medication Question Call-A-AH, Hip Pain-A-AH

## 2024-05-31 NOTE — Telephone Encounter (Signed)
Pt. Needs to be seen for this. Thanks, WS 

## 2024-05-31 NOTE — Telephone Encounter (Signed)
 Copied from CRM [id number]. Topic: Clinical - Red Word Triage >> May 31, 2024  3:15 PM Tiffany H wrote: Patient/patient representative is calling to schedule an appointment. Refer to attachments for appointment information.    Reason for CRM: Patient called in reporting pain. Dr. Veleta Gerold advised patient should be scheduled ASAP. Nothing available before 06/12/24.   Call transferred to [facility] at clinic.

## 2024-05-31 NOTE — Telephone Encounter (Signed)
 LMTCB

## 2024-05-31 NOTE — Telephone Encounter (Signed)
 Copied from CRM [id number]. Topic: General - Other >> May 31, 2024 11:43 AM Tiffany S wrote: Reason for CRM: Patient was in the hospital for bleeding and the ER doctor suggested he stop the naproxen  please follow up with patient as he wants to know what other medication he can take without causing bleeding

## 2024-05-31 NOTE — Telephone Encounter (Signed)
 Duplicate encounter---see other CRM for RN Triage Notes

## 2024-05-31 NOTE — Telephone Encounter (Signed)
 This encounter was created in error - please disregard.

## 2024-05-31 NOTE — Telephone Encounter (Signed)
 Patient returned call. Scheduled to be seen by Dr. Veleta Gerold 06/24 at 2:25 pm

## 2024-06-01 ENCOUNTER — Ambulatory Visit: Payer: Self-pay | Admitting: Gastroenterology

## 2024-06-01 ENCOUNTER — Encounter (HOSPITAL_COMMUNITY): Payer: Self-pay | Admitting: Gastroenterology

## 2024-06-04 ENCOUNTER — Telehealth: Payer: Self-pay | Admitting: Gastroenterology

## 2024-06-04 NOTE — Telephone Encounter (Signed)
 Will call patient back tomorrow to discuss capsule endoscopy instructions.

## 2024-06-04 NOTE — Telephone Encounter (Signed)
 Inbound call from patient returning phone call regarding 6/18 results follow up note. States he will be at work today and may be able to answer better tomorrow due to not being at work. Please advise, thank you.

## 2024-06-04 NOTE — Telephone Encounter (Signed)
 Capsule endoscopy scheduled for 06/25/2024. Amb referral order placed. Instructions sent to my chart and mailed out to patient. Attempted to reach patient to discuss date and time. No answer, left vm for patient to return call.

## 2024-06-05 ENCOUNTER — Encounter: Payer: Self-pay | Admitting: Family Medicine

## 2024-06-05 ENCOUNTER — Ambulatory Visit (INDEPENDENT_AMBULATORY_CARE_PROVIDER_SITE_OTHER): Admitting: Family Medicine

## 2024-06-05 VITALS — BP 117/66 | HR 76 | Temp 98.2°F | Ht <= 58 in | Wt 236.0 lb

## 2024-06-05 DIAGNOSIS — F17210 Nicotine dependence, cigarettes, uncomplicated: Secondary | ICD-10-CM

## 2024-06-05 DIAGNOSIS — K2990 Gastroduodenitis, unspecified, without bleeding: Secondary | ICD-10-CM

## 2024-06-05 DIAGNOSIS — E782 Mixed hyperlipidemia: Secondary | ICD-10-CM | POA: Diagnosis not present

## 2024-06-05 DIAGNOSIS — K2970 Gastritis, unspecified, without bleeding: Secondary | ICD-10-CM

## 2024-06-05 MED ORDER — PREGABALIN 300 MG PO CAPS: 300.0000 mg | ORAL_CAPSULE | Freq: Every day | ORAL | 1 refills | Status: DC

## 2024-06-05 MED ORDER — TRIAMTERENE-HCTZ 37.5-25 MG PO TABS: 1.0000 | ORAL_TABLET | Freq: Every day | ORAL | 3 refills | Status: AC

## 2024-06-05 NOTE — Telephone Encounter (Signed)
 Spoke with patient discussed scheduled capsule endoscopy. Patient reports that he will be working on 07/14. Rescheduled patient for 06/28/2024 to accommodate work schedule. New instructions sent via my chart and mail. Discussed instructions with patient verbally over telephone. Patient verbalized understanding of all instructions.

## 2024-06-05 NOTE — Progress Notes (Signed)
 Subjective:  Patient ID: Charles Santos, male    DOB: 12-Sep-1971  Age: 53 y.o. MRN: [id number]  CC: pain meds (Told to stop naproxen  due to causing ulcers and possible causing him to bleed internally. ) and Shoulder Pain (Hurts to touch right shoulder or with over use of that muscle.  Feels like there is a lot of tension.)   HPI Charles Santos presents for  follow-up of hypertension. Patient has no history of headache chest pain or shortness of breath or recent cough. Patient also denies symptoms of TIA such as focal numbness or weakness. Patient denies side effects from medication. States taking it regularly.    in for follow-up of elevated cholesterol. Doing well without complaints on current medication. Denies side effects of fenofibrate  including myalgia and arthralgia and nausea. Currently no chest pain, shortness of breath or other cardiovascular related symptoms noted.  Negative scan for GI bleed reviewed. Has a lot of heartburn. Recent increase of pantoprazole  to BID  History Charles Santos has a past medical history of Acute midline low back pain with bilateral sciatica (05/16/2020), Anxiety, Arthritis, Cancer of transverse colon (HCC) (09/17/2016), Colon cancer (HCC) (1998), Complication of anesthesia, Depression, Emphysema of lung (HCC), Family hx of colon cancer, GERD (gastroesophageal reflux disease), Headache, Hernia, inguinal, Hyperlipidemia, Hypertension, Pneumonia, and Vitamin D  deficiency.   He has a past surgical history that includes colon resectomy (1998); Appendectomy; Laparoscopic subtotal colectomy (N/A, 09/17/2016); Proctoscopy (09/17/2016); Inguinal hernia repair; Upper gi endoscopy; polyp removal at [facility] (2017); Flexible sigmoidoscopy (N/A, 05/19/2017); Esophagogastroduodenoscopy (egd) with propofol  (N/A, 05/19/2017); Colonoscopy; Transforaminal lumbar interbody fusion w/ mis 1 level (Right, 02/02/2021); Polypectomy; Sigmoidoscopy; Upper gastrointestinal endoscopy; Flexible  sigmoidoscopy (N/A, 06/04/2021); Esophagogastroduodenoscopy (egd) with propofol  (N/A, 06/04/2021); Flexible sigmoidoscopy (N/A, 08/12/2022); biopsy (08/12/2022); polypectomy (08/12/2022); Back surgery; Inguinal hernia repair (Left, 01/07/2023); Esophagogastroduodenoscopy (egd) with propofol  (N/A, 03/01/2024); Colonoscopy with propofol  (N/A, 03/01/2024); Muscle biopsy (03/01/2024); Esophagogastroduodenoscopy (N/A, 05/29/2024); Givens capsule study (N/A, 05/29/2024); and Biopsy of skin subcutaneous tissue and/or mucous membrane (05/29/2024).   His family history includes Colon cancer in his father.He reports that he quit smoking about 3 years ago. His smoking use included cigarettes. He started smoking about 26 years ago. He has a 23 pack-year smoking history. He has never used smokeless tobacco. He reports current alcohol use of about 63.0 standard drinks of alcohol per week. He reports that he does not use drugs.  Current Outpatient Medications on File Prior to Visit  Medication Sig Dispense Refill   acetaminophen  (TYLENOL ) 500 MG tablet Take 500-1,000 mg by mouth every 6 (six) hours as needed for moderate pain (pain score 4-6).     fenofibrate  160 MG tablet Take 1 tablet (160 mg total) by mouth daily. For cholesterol and triglyceride 90 tablet 3   Multiple Vitamin (MULTIVITAMIN WITH MINERALS) TABS tablet Take 1 tablet by mouth daily.     Omega-3 Fatty Acids (FISH OIL) 1200 MG CAPS Take 1,200 mg by mouth See admin instructions. 1am 2apm     pantoprazole  (PROTONIX ) 40 MG tablet Take 1 tablet (40 mg total) by mouth 2 (two) times daily. 30 tablet 5   QUEtiapine  (SEROQUEL ) 100 MG tablet Take 1 tablet (100 mg total) by mouth at bedtime. 90 tablet 3   rosuvastatin  (CRESTOR ) 20 MG tablet Take 1 tablet (20 mg total) by mouth daily. 90 tablet 3   No current facility-administered medications on file prior to visit.    ROS Review of Systems  Constitutional:  Negative for fever.  Respiratory:  Negative  for  shortness of breath.   Cardiovascular:  Negative for chest pain.  Musculoskeletal:  Negative for arthralgias.  Skin:  Negative for rash.    Objective:  BP 117/66   Pulse 76   Temp 98.2 F (36.8 C)   Ht 2' (0.61 m)   Wt 236 lb (107 kg)   SpO2 98%   BMI 288.07 kg/m   BP Readings from Last 3 Encounters:  06/05/24 117/66  05/30/24 (!) 122/56  03/13/24 (!) 99/53    Wt Readings from Last 3 Encounters:  06/05/24 236 lb (107 kg)  05/29/24 230 lb 0.2 oz (104.3 kg)  03/13/24 235 lb (106.6 kg)     Physical Exam Vitals reviewed.  Constitutional:      Appearance: He is well-developed.  HENT:     Head: Normocephalic and atraumatic.     Right Ear: External ear normal.     Left Ear: External ear normal.     Mouth/Throat:     Pharynx: No oropharyngeal exudate or posterior oropharyngeal erythema.   Eyes:     Pupils: Pupils are equal, round, and reactive to light.    Cardiovascular:     Rate and Rhythm: Normal rate and regular rhythm.     Heart sounds: No murmur heard. Pulmonary:     Effort: No respiratory distress.     Breath sounds: Normal breath sounds.   Musculoskeletal:     Cervical back: Normal range of motion and neck supple.   Neurological:     Mental Status: He is alert and oriented to person, place, and time.       Assessment & Plan:  Gastritis and gastroduodenitis  Smoking greater than 20 pack years -     Ambulatory Referral for Lung Cancer Scre  Mixed hyperlipidemia  Other orders -     Triamterene -HCTZ; Take 1 tablet by mouth daily.  Dispense: 90 tablet; Refill: 3 -     Pregabalin ; Take 1 capsule (300 mg total) by mouth at bedtime.  Dispense: 90 capsule; Refill: 1    Allergies as of 06/05/2024       Reactions   Hydrocodone     Latex    Oxycodone  Other (See Comments)   Made pt feel like a ZOMBIE   Adhesive [tape] Rash, Other (See Comments)   Prefers paper tape        Medication List        Accurate as of June 05, 2024 10:03 PM. If  you have any questions, ask your nurse or doctor.          acetaminophen  500 MG tablet Commonly known as: TYLENOL  Take 500-1,000 mg by mouth every 6 (six) hours as needed for moderate pain (pain score 4-6).   fenofibrate  160 MG tablet Take 1 tablet (160 mg total) by mouth daily. For cholesterol and triglyceride   Fish Oil 1200 MG Caps Take 1,200 mg by mouth See admin instructions. 1am 2apm   multivitamin with minerals Tabs tablet Take 1 tablet by mouth daily.   pantoprazole  40 MG tablet Commonly known as: PROTONIX  Take 1 tablet (40 mg total) by mouth 2 (two) times daily.   pregabalin  300 MG capsule Commonly known as: LYRICA  Take 1 capsule (300 mg total) by mouth at bedtime. What changed:  medication strength how much to take Changed by: Tally Mattox   QUEtiapine  100 MG tablet Commonly known as: SEROQUEL  Take 1 tablet (100 mg total) by mouth at bedtime.   rosuvastatin  20 MG tablet Commonly known as: CRESTOR   Take 1 tablet (20 mg total) by mouth daily.   triamterene -hydrochlorothiazide 37.5-25 MG tablet Commonly known as: MAXZIDE-25 Take 1 tablet by mouth daily. What changed: See the new instructions. Changed by: Butler Hareem Surowiec         Follow-up: Return in about 4 months (around 10/15/2024) for hypertension.  Butler Der, M.D.

## 2024-06-06 NOTE — Telephone Encounter (Signed)
 Attempted to reach patient. No answer, left vm for patient to return call.

## 2024-06-06 NOTE — Telephone Encounter (Signed)
 Patient called and stated that he is returning a call back to [place]. Patient stated that he is not able to have his phone on him at work, but if Daphne can leave a detailed VM and he will call back with a better response for her. Patient is requesting a call back. Please advise.

## 2024-06-06 NOTE — Telephone Encounter (Signed)
 Patient requesting f/u call to discuss previous note. Please advise.

## 2024-06-06 NOTE — Telephone Encounter (Signed)
 Returned call to patient, left detailed message. Also sent My Chart message to patient.

## 2024-06-07 NOTE — Telephone Encounter (Signed)
 Patient called back and stated that he can not do the 17 th and would need a Wednesday or Thursday. Patient stated that he works a 12 hour shift on Tuesday. Patient also stated that he is needing to have the miralax  sent over to medicine pharmacy. Patient is requesting to pick up his instruction tomorrow. Patient is requesting a call back. Please advise.

## 2024-06-07 NOTE — Telephone Encounter (Signed)
 Left another vm for patient. Patient's appointment is scheduled for July 17 which is a Thursday. We discussed this on the first phone call that patient works 12 hour shift so he would not be able to prep Tuesday night after his shift. We scheduled the appointment for a Thursday since the patient is also off on Wednesday to prep. Advised patient that Miralax  is sold as an otc medication. He can pick it up at any pharmacy. Patient's instruction were sent via mail, and uploaded to My Chart. All of this information was relayed to patient in the vm, as patient requested detailed vms be left.

## 2024-06-08 ENCOUNTER — Other Ambulatory Visit: Payer: Self-pay

## 2024-06-08 MED ORDER — POLYETHYLENE GLYCOL 3350 17 GM/SCOOP PO POWD: 238.0000 g | Freq: Every day | ORAL | 0 refills | Status: AC

## 2024-06-08 NOTE — Telephone Encounter (Signed)
 Spoke with patient. Verified date of procedure with patient. Patient requesting Miralax  sent to pharmacy. Rx sent, advised patient that insurance may not pay for it due to it being an otc medication. Verbalized understanding.

## 2024-06-20 ENCOUNTER — Telehealth: Payer: Self-pay | Admitting: Family Medicine

## 2024-06-20 NOTE — Telephone Encounter (Signed)
 Referral sent to: [facility] at [facility] [address] [place], [place] - [place] [address] [phone]  Mychart Message sent to Patient with Specialty Office contact information.  [facility] will call the Patient once they have reviewed and gain PA for exam.

## 2024-06-20 NOTE — Telephone Encounter (Signed)
 Pt. Needs to be seen to work on further possibilities for pain. Thanks, WS

## 2024-06-20 NOTE — Telephone Encounter (Signed)
 Left detailed message per signed DPR. Encouraged call back or send us  a Mychart message if there are any questions.

## 2024-06-20 NOTE — Telephone Encounter (Signed)
 Copied from CRM [id number]. Topic: Referral - Status >> Jun 20, 2024  9:05 AM Ivette P wrote: Reason for CRM: PT called in about Lung scan referral. Pt has not yet to receive call.   Referral shows no provider, pt would like to know when he can be scheduled.  Pls follow up with pt [phone]

## 2024-06-20 NOTE — Telephone Encounter (Signed)
 Copied from CRM [id number]. Topic: Clinical - Medical Advice >> Jun 20, 2024  9:06 AM Ivette P wrote: Reason for CRM: Pt wanted to notify primary Stacks, Butler of upcoming procedure.   Wednesday will be cleaned out Thursday - will swallow camera capsule and take images of colon and bleeding before.    Prescription that pt is taking pregabalin  (LYRICA ) 300 MG capsule and says it is not taking away the pain. Pt would like to know if another option is available.    Pt is available for callback, pt is at work and if does not answer asking to leave a detailed message.

## 2024-06-25 ENCOUNTER — Encounter

## 2024-06-28 ENCOUNTER — Ambulatory Visit: Admitting: Gastroenterology

## 2024-06-28 DIAGNOSIS — K529 Noninfective gastroenteritis and colitis, unspecified: Secondary | ICD-10-CM

## 2024-06-28 DIAGNOSIS — K633 Ulcer of intestine: Secondary | ICD-10-CM | POA: Diagnosis not present

## 2024-06-28 DIAGNOSIS — D649 Anemia, unspecified: Secondary | ICD-10-CM

## 2024-06-28 DIAGNOSIS — K921 Melena: Secondary | ICD-10-CM | POA: Diagnosis not present

## 2024-06-28 DIAGNOSIS — K3189 Other diseases of stomach and duodenum: Secondary | ICD-10-CM

## 2024-06-28 DIAGNOSIS — D62 Acute posthemorrhagic anemia: Secondary | ICD-10-CM

## 2024-06-28 NOTE — Progress Notes (Unsigned)
 SN: [id number] Exp: 2025-02-22 LOT: [id number]  Patient arrived for VCE. Reported the prep went well. This RN explained capsule dietary restrictions for the next few hours. Pt advised to return at 4 pm to return capsule equipment.  Patient verbalized understanding. Opened capsule, ensured capsule was flashing prior to the patient swallowing the capsule. Patient swallowed capsule without difficulty.  Patient told to call the office with any questions and if capsule has not passed after 72 hours. No further questions by the conclusion of the visit.

## 2024-06-28 NOTE — Patient Instructions (Signed)
You may have clear liquids beginning at 10:30 am after ingesting the capsule.    You can have a light lunch at 12:30 pm; sandwich and half bowl of soup.  Return to the office at 4 pm to return the equipment.   Return to you normal diet at 5 pm.   Call [phone] and ask for Glendora Score, RN if you have any questions.  You should pass the capsule in your stool 8-48 hours after ingestion. If you have not passed the capsule, after 72 hours, please contact the office at [phone].

## 2024-07-12 ENCOUNTER — Ambulatory Visit: Payer: Self-pay | Admitting: *Deleted

## 2024-07-12 ENCOUNTER — Telehealth: Payer: Self-pay | Admitting: Gastroenterology

## 2024-07-12 NOTE — Telephone Encounter (Signed)
 Patient is calling to see if we have any received the results form his Capsule endoscopy. Patient stated that he would be able to receive our call if we decide to call him back today but if not to please leave a detailed VM if we happen to call tomorrow due to him working from 7-7. Please advise.

## 2024-07-12 NOTE — Telephone Encounter (Signed)
 Patient calling regarding his 2nd capsule endoscopy which was performed on 06/28/2024. Advised patient that we have not received those results yet but we would let him know when they arrived.

## 2024-07-12 NOTE — Telephone Encounter (Signed)
 Copied from CRM #[id number]. Topic: Clinical - Red Word Triage >> Jul 12, 2024 11:27 AM Charles Santos wrote: [facility] that prompted transfer to Nurse Triage: did a breathing test, to wear a mask and the smoke and inhalation to breathe in. work sent over to the doctor. get in contact with stacks. to get an inhaler about being able to breathe better. SOB     Back and hip  - had history back surgery. going on for 2 months. Reason for Disposition  Back pain is a chronic symptom (recurrent or ongoing AND present > 4 weeks)  Answer Assessment - Initial Assessment Questions 1. ONSET: When did the pain begin? (e.g., minutes, hours, days)     I'm having back and hip pain.   I rolled in bed and my leg pushed out from my body and felt out of place and been hurting ever since.  This happened 2-3 months ago.   Dr. Butler said if I needed pain medication to call him that he would need to see me.   I'm needing an appt to see Dr. Zollie.    They took me off of my pain medicine due to my colon cancer.   I was on Naproxen .   They took me off of that.     2. LOCATION: Where does it hurt? (upper, mid or lower back)     I'm on my feet all day at work 12 hr shifts on concrete.    I had back surgery 4 yrs ago.  It's my lower back.   They put pins in my back. 3. SEVERITY: How bad is the pain?  (e.g., Scale 1-10; mild, moderate, or severe)     It's getting worse the pain in my back. 4. PATTERN: Is the pain constant? (e.g., yes, no; constant, intermittent)      constant 5. RADIATION: Does the pain shoot into your legs or somewhere else?     Whenever I pick up my right leg it starts hurting in my hip area.  It's my right hip. 6. CAUSE:  What do you think is causing the back pain?      I've had back surgery 4 yrs ago 7. BACK OVERUSE:  Any recent lifting of heavy objects, strenuous work or exercise?     I work 12 hr shifts on my job. 8. MEDICINES: What have you taken so far for the pain? (e.g., nothing,  acetaminophen , NSAIDS)     They took me off naproxen  due to stomach bleeding. 9. NEUROLOGIC SYMPTOMS: Do you have any weakness, numbness, or problems with bowel/bladder control?     No 10. OTHER SYMPTOMS: Do you have any other symptoms? (e.g., fever, abdomen pain, burning with urination, blood in urine)       I need to get an inhaler.   My work sent me to a clinic today because we will be wearing a hood over my head.   They did a test to see if I could wear the hood.   But they suggested an inhaler to help open my lungs up. Dr. Zollie set up for a CT scan of my lungs.  I need to call and set it up.  11. PREGNANCY: Is there any chance you are pregnant? When was your last menstrual period?       N/A  Protocols used: Back Pain-A-AH       FYI Only or Action Required?: FYI only for provider.  Patient was last seen in primary care  on 06/05/2024 by Charles Lowers, MD.  Called Nurse Triage reporting Back Pain.  Symptoms began several years ago. Needing pain medication for the day time since we works 12 hr shifts walking on concrete.   The Naproxen  was discontinued so Dr. Zollie told him if he needed pain medication he would need to come in and be seen.   Appt scheduled on Aug. 13 at his request.   He needs an inhaler to help him breath better while wearing a hood on his job per the doctor that did a breathing test on him today 7/31.   Wants to discuss this too.     Interventions attempted: OTC medications: Not help.  Symptoms are: gradually worsening The pain in his lower back and left hip..  Triage Disposition: See PCP Within 2 Weeks  Patient/caregiver understands and will follow disposition?: Yes

## 2024-07-12 NOTE — Telephone Encounter (Signed)
 Dr Wilhelmenia have you seen capsule results from 7/17?

## 2024-07-12 NOTE — Telephone Encounter (Signed)
 [name], I have not seen them as of yet.  Not sure if Amy has read it as of yet.  Do you know who the capsule MD of the week is in case the capsule is on their desk? Thanks. GM

## 2024-07-12 NOTE — Telephone Encounter (Signed)
 I called and spoke with patient regarding this and he says he is not having SOB. He says soon he has to be fitted for this helmet type hat that he will have to wear everyday at work and another doctor/nurse that he spoke with this morning, recommended he contact his PCP and request a PRN inhaler in case he has any SOB while wearing the helmet. Patient is scheduled to see PCP on 07/25/24 and will discuss with him at that time.

## 2024-07-16 ENCOUNTER — Telehealth: Payer: Self-pay | Admitting: *Deleted

## 2024-07-16 ENCOUNTER — Other Ambulatory Visit: Payer: Self-pay | Admitting: *Deleted

## 2024-07-16 DIAGNOSIS — Z122 Encounter for screening for malignant neoplasm of respiratory organs: Secondary | ICD-10-CM

## 2024-07-16 DIAGNOSIS — Z87891 Personal history of nicotine dependence: Secondary | ICD-10-CM

## 2024-07-16 NOTE — Telephone Encounter (Signed)
 Lung Cancer Screening Narrative/Criteria Questionnaire (Cigarette Smokers Only- No Cigars/Pipes/vapes)   Charles Santos   SDMV:07/26/24 11:00- [place]                                           1971/03/14              LDCT: 07/30/24 9:30 AP    52 y.o.   Phone: [phone]  Lung Screening Narrative (confirm age 46-77 yrs Medicare / 50-80 yrs Private pay insurance)   Insurance information:[employer]   Referring Provider:[facility]   This screening involves an initial phone call with a team member from our program. It is called a shared decision making visit. The initial meeting is required by insurance and Medicare to make sure you understand the program. This appointment takes about 15-20 minutes to complete. The CT scan will completed at a separate date/time. This scan takes about 5-10 minutes to complete and you may eat and drink before and after the scan.  Criteria questions for Lung Cancer Screening:   Are you a current or former smoker? Former Age began smoking: 9   If you are a former smoker, what year did you quit smoking? 2020 (within 15 yrs)   To calculate your smoking history, I need an accurate estimate of how many packs of cigarettes you smoked per day and for how many years. (Not just the number of PPD you are now smoking)   Years smoking 38 x Packs per day 1 = Pack years 38   (at least 20 pack yrs)   (Make sure they understand that we need to know how much they have smoked in the past, not just the number of PPD they are smoking now)  Do you have a personal history of cancer?  Yes - (type and when diagnosed - 5 yrs cancer free) colon 1998 & 2017- Surgery    Do you have a family history of cancer? Father (colon)  Are you coughing up blood?  No  Have you had unexplained weight loss of 15 lbs or more in the last 6 months? No  It looks like you meet all criteria.     Additional information: N/A

## 2024-07-25 ENCOUNTER — Encounter: Payer: Self-pay | Admitting: Family Medicine

## 2024-07-25 ENCOUNTER — Ambulatory Visit (INDEPENDENT_AMBULATORY_CARE_PROVIDER_SITE_OTHER): Admitting: Family Medicine

## 2024-07-25 ENCOUNTER — Telehealth: Payer: Self-pay

## 2024-07-25 VITALS — BP 120/56 | HR 74 | Ht 70.0 in | Wt 237.8 lb

## 2024-07-25 DIAGNOSIS — F3341 Major depressive disorder, recurrent, in partial remission: Secondary | ICD-10-CM | POA: Diagnosis not present

## 2024-07-25 DIAGNOSIS — G8929 Other chronic pain: Secondary | ICD-10-CM

## 2024-07-25 DIAGNOSIS — M5010 Cervical disc disorder with radiculopathy, unspecified cervical region: Secondary | ICD-10-CM | POA: Diagnosis not present

## 2024-07-25 DIAGNOSIS — M5441 Lumbago with sciatica, right side: Secondary | ICD-10-CM | POA: Diagnosis not present

## 2024-07-25 DIAGNOSIS — I10 Essential (primary) hypertension: Secondary | ICD-10-CM | POA: Diagnosis not present

## 2024-07-25 DIAGNOSIS — M5416 Radiculopathy, lumbar region: Secondary | ICD-10-CM

## 2024-07-25 DIAGNOSIS — K2100 Gastro-esophageal reflux disease with esophagitis, without bleeding: Secondary | ICD-10-CM

## 2024-07-25 MED ORDER — PREGABALIN 200 MG PO CAPS: 400.0000 mg | ORAL_CAPSULE | Freq: Every day | ORAL | 1 refills | Status: DC

## 2024-07-25 MED ORDER — LANSOPRAZOLE 30 MG PO CPDR
30.0000 mg | DELAYED_RELEASE_CAPSULE | Freq: Two times a day (BID) | ORAL | 5 refills | Status: AC
Start: 2024-07-25 — End: ?

## 2024-07-25 MED ORDER — NABUMETONE 500 MG PO TABS
1000.0000 mg | ORAL_TABLET | Freq: Two times a day (BID) | ORAL | 1 refills | Status: AC
Start: 2024-07-25 — End: ?

## 2024-07-25 NOTE — Progress Notes (Signed)
 Subjective:  Patient ID: Charles Santos, male    DOB: 06-11-1971  Age: 53 y.o. MRN: [id number]  CC: Back Pain (Radiates down right leg)   HPI  Discussed the use of AI scribe software for clinical note transcription with the patient, who gave verbal consent to proceed.  History of Present Illness   Charles Santos is a 53 year old male with chronic neck and back pain who presents with worsening pain despite medication adjustments.  He experiences worsening neck pain despite an increase in Lyrica  (pregabalin ) to 300 mg. The pain is located on the left side of his neck and radiates down into his chest bone, although the chest pain has resolved. Increased pain occurs when sitting for prolonged periods, particularly affecting his right hip. He describes a sensation of tendons stretching in his right leg and hip when walking slowly, and he has difficulty lifting his right leg to put on shoes due to pain.  He has a history of back surgery and reports tingling sensations, which he is unsure if they are related to the surgery. His neck pain limits his ability to turn his head to the left. An MRI from last year showed four crushed vertebrae in his neck, pinching a nerve.  He also experiences lower back pain, which has improved but persists. Sitting for long periods exacerbates the pain, and he has a history of pins placed in his lower back to relieve pressure on the sciatic nerve. The area is no longer numb, but stiffness and pain occur after prolonged sitting.  He has been using naproxen  for pain relief, which was effective but caused ulcers, leading to its discontinuation. He has tried over-the-counter Advil with some relief. He is currently taking pantoprazole  for acid reflux, which was diagnosed after a camera pill test revealed inflammation at the junction of his esophagus and stomach, suspected to be the source of previous bleeding.  He reports occasional shortness of breath when walking long  distances or carrying heavy objects. He underwent a pulmonary function test at work, which was normal, and uses a respirator to protect against smoke and dust at his workplace. He finds the respirator uncomfortable due to heat but acknowledges its necessity for safety.       presents for  follow-up of hypertension. Patient has no history of headache chest pain or shortness of breath or recent cough. Patient also denies symptoms of TIA such as focal numbness or weakness. Patient denies side effects from medication. States taking it regularly.  Patient in for follow-up of GERD. Currently asymptomatic taking  PPI daily. There is no chest pain or heartburn. No hematemesis and no melena. No dysphagia or choking. Onset is remote. Progression is stable. Complicating factors, none.      03/13/2024    3:12 PM 01/27/2024    9:53 AM 04/07/2023    8:45 AM 03/18/2023    3:35 PM  GAD 7 : Generalized Anxiety Score  Nervous, Anxious, on Edge 0 0 0 0  Control/stop worrying 0 0 0 0  Worry too much - different things 0 0 0 0  Trouble relaxing 0 0 0 0  Restless 0 0 0 0  Easily annoyed or irritable 0 0 0 0  Afraid - awful might happen 0 0 0 0  Total GAD 7 Score 0 0 0 0  Anxiety Difficulty Not difficult at all  Not difficult at all Not difficult at all          07/25/2024  9:46 AM 03/13/2024    3:12 PM 01/27/2024    9:53 AM  Depression screen PHQ 2/9  Decreased Interest 0 0 0  Down, Depressed, Hopeless 0 0 0  PHQ - 2 Score 0 0 0  Altered sleeping  0   Tired, decreased energy  0   Change in appetite  0   Feeling bad or failure about yourself   0   Trouble concentrating  0   Moving slowly or fidgety/restless  0   Suicidal thoughts  0   PHQ-9 Score  0   Difficult doing work/chores  Not difficult at all     History Hermenegildo has a past medical history of Acute midline low back pain with bilateral sciatica (05/16/2020), Anxiety, Arthritis, Cancer of transverse colon (HCC) (09/17/2016), CAP (community  acquired pneumonia) (11/07/2020), Colon cancer (HCC) (1998), Complication of anesthesia, Depression, Emphysema of lung (HCC), Family hx of colon cancer, GERD (gastroesophageal reflux disease), Headache, Hernia, inguinal, Hyperlipidemia, Hypertension, Pneumonia, and Vitamin D  deficiency.   He has a past surgical history that includes colon resectomy (1998); Appendectomy; Laparoscopic subtotal colectomy (N/A, 09/17/2016); Proctoscopy (09/17/2016); Inguinal hernia repair; Upper gi endoscopy; polyp removal at [facility] (2017); Flexible sigmoidoscopy (N/A, 05/19/2017); Esophagogastroduodenoscopy (egd) with propofol  (N/A, 05/19/2017); Colonoscopy; Transforaminal lumbar interbody fusion w/ mis 1 level (Right, 02/02/2021); Polypectomy; Sigmoidoscopy; Upper gastrointestinal endoscopy; Flexible sigmoidoscopy (N/A, 06/04/2021); Esophagogastroduodenoscopy (egd) with propofol  (N/A, 06/04/2021); Flexible sigmoidoscopy (N/A, 08/12/2022); biopsy (08/12/2022); polypectomy (08/12/2022); Back surgery; Inguinal hernia repair (Left, 01/07/2023); Esophagogastroduodenoscopy (egd) with propofol  (N/A, 03/01/2024); Colonoscopy with propofol  (N/A, 03/01/2024); Muscle biopsy (03/01/2024); Esophagogastroduodenoscopy (N/A, 05/29/2024); Givens capsule study (N/A, 05/29/2024); and Biopsy of skin subcutaneous tissue and/or mucous membrane (05/29/2024).   His family history includes Colon cancer in his father.He reports that he quit smoking about 3 years ago. His smoking use included cigarettes. He started smoking about 26 years ago. He has a 23 pack-year smoking history. He has never used smokeless tobacco. He reports current alcohol use of about 63.0 standard drinks of alcohol per week. He reports that he does not use drugs.    ROS Review of Systems  Constitutional:  Negative for fever.  Respiratory:  Negative for shortness of breath.   Cardiovascular:  Negative for chest pain.  Musculoskeletal:  Positive for back pain and neck pain.  Negative for arthralgias.  Skin:  Negative for rash.    Objective:  BP (!) 120/56   Pulse 74   Ht 5' 10 (1.778 m)   Wt 237 lb 12.8 oz (107.9 kg)   SpO2 97%   BMI 34.12 kg/m   BP Readings from Last 3 Encounters:  07/25/24 (!) 120/56  06/05/24 117/66  05/30/24 (!) 122/56    Wt Readings from Last 3 Encounters:  07/25/24 237 lb 12.8 oz (107.9 kg)  06/05/24 236 lb (107 kg)  05/29/24 230 lb 0.2 oz (104.3 kg)     Physical Exam Vitals reviewed.  Constitutional:      Appearance: He is well-developed.  HENT:     Head: Normocephalic and atraumatic.     Right Ear: External ear normal.     Left Ear: External ear normal.     Mouth/Throat:     Pharynx: No oropharyngeal exudate or posterior oropharyngeal erythema.  Eyes:     Pupils: Pupils are equal, round, and reactive to light.  Cardiovascular:     Rate and Rhythm: Normal rate and regular rhythm.     Heart sounds: No murmur heard. Pulmonary:     Effort: No  respiratory distress.     Breath sounds: Normal breath sounds.  Musculoskeletal:        General: Tenderness (posterior neck) present.     Cervical back: Normal range of motion and neck supple.  Neurological:     Mental Status: He is alert and oriented to person, place, and time.      Assessment & Plan:  Cervical disc disorder with radiculopathy  Chronic right-sided low back pain with right-sided sciatica  Recurrent major depressive disorder, in partial remission (HCC)  Essential hypertension  Lumbar radiculopathy  Other orders -     Pregabalin ; Take 2 capsules (400 mg total) by mouth at bedtime.  Dispense: 180 capsule; Refill: 1 -     Nabumetone ; Take 2 tablets (1,000 mg total) by mouth 2 (two) times daily. For muscle and joint pain  Dispense: 360 tablet; Refill: 1 -     Lansoprazole ; Take 1 capsule (30 mg total) by mouth 2 (two) times daily. On an empty stomach. Wait One hour before eating.  Dispense: 60 capsule; Refill: 5    Assessment and Plan     Chronic neck pain with cervical radiculopathy due to crushed cervical vertebrae Chronic neck pain with radiculopathy due to crushed cervical vertebrae. Pain worsened despite treatment. Surgical intervention declined due to potential loss of neck mobility. - Increase pregabalin  to 400 mg daily for pain management. - Refer to a specialist for further evaluation and management options.  Chronic low back pain with lumbar surgery and hardware Chronic low back pain post-lumbar surgery with hardware. Pain persists, especially after prolonged sitting.  Right hip pain and limited mobility Right hip pain with limited mobility, possibly related to previous lumbar surgery.  Esophagitis with prior upper GI bleeding Esophagitis with prior upper GI bleeding, likely exacerbated by naproxen . Endoscopy showed inflammation at gastroesophageal junction. Nabumetone  considered safer alternative. - Discontinue naproxen  due to risk of GI bleeding. - Start nabumetone , 2 tablets every morning and evening with food. - Switch from pantoprazole  to lansoprazole  (Prevacid ), 1 tablet twice daily on an empty stomach.  Obesity Obesity with weight of 237 pounds at height of 5'10. Advised to lose 50-60 pounds to alleviate pain and improve health. - Advise significant weight loss through dietary changes and increased physical activity.          Follow-up: Return in about 1 month (around 08/25/2024).  Butler Der, M.D.

## 2024-07-25 NOTE — Telephone Encounter (Signed)
 Copied from CRM [id number]. Topic: General - Other >> Jul 25, 2024  3:58 PM Charles Santos wrote: Reason for CRM: Pt called wants to know what medications is he supposed to stop taking? Please call pt at [phone].

## 2024-07-25 NOTE — Telephone Encounter (Signed)
 Spoke with patient and made aware to discontinue Naproxen  and Pantoprazole .

## 2024-07-26 ENCOUNTER — Ambulatory Visit (INDEPENDENT_AMBULATORY_CARE_PROVIDER_SITE_OTHER): Admitting: Adult Health

## 2024-07-26 ENCOUNTER — Encounter: Payer: Self-pay | Admitting: Adult Health

## 2024-07-26 DIAGNOSIS — Z87891 Personal history of nicotine dependence: Secondary | ICD-10-CM

## 2024-07-26 NOTE — Progress Notes (Signed)
  Virtual Visit via Telephone Note  I connected with Charles Santos , 07/26/24 11:08 AM by a telemedicine application and verified that I am speaking with the correct person using two identifiers.  Location: Patient: home Provider: home   I discussed the limitations of evaluation and management by telemedicine and the availability of in person appointments. The patient expressed understanding and agreed to proceed.   Shared Decision Making Visit Lung Cancer Screening Program [phone])   Eligibility: 53 y.o. Pack Years Smoking History Calculation = 38 pack years (# packs/per year x # years smoked) Recent History of coughing up blood  no Unexplained weight loss? no ( >Than 15 pounds within the last 6 months ) Prior History Lung / other cancer yes (Diagnosis within the last 5 years already requiring surveillance chest CT Scans). Colon cancer 2020 Smoking Status Former Smoker Former Smokers: Years since quit: 5 years  Quit Date: 2020  Visit Components: Discussion included one or more decision making aids. YES Discussion included risk/benefits of screening. YES Discussion included potential follow up diagnostic testing for abnormal scans. YES Discussion included meaning and risk of over diagnosis. YES Discussion included meaning and risk of False Positives. YES Discussion included meaning of total radiation exposure. YES  Counseling Included: Importance of adherence to annual lung cancer LDCT screening. YES Impact of comorbidities on ability to participate in the program. YES Ability and willingness to under diagnostic treatment. YES  Smoking Cessation Counseling: Former Smokers:  Discussed the importance of maintaining cigarette abstinence. yes Diagnosis Code: Personal History of Nicotine  Dependence. S12.108 Information about tobacco cessation classes and interventions provided to patient. Yes Patient provided with ticket for LDCT Scan. yes Written Order for Lung Cancer  Screening with LDCT placed in Epic. Yes (CT Chest Lung Cancer Screening Low Dose W/O CM) [id number]  Z12.2-Screening of respiratory organs Z87.891-Personal history of nicotine  dependence   Charles Santos 07/26/24

## 2024-07-26 NOTE — Patient Instructions (Signed)

## 2024-07-27 NOTE — Progress Notes (Signed)
 Full video capsule endoscopy report to be scanned into the chart  Results show evidence of a complete VCE with adequate prep. Duodenum with small erosions. Multiple aphthous ulcerations and ulcers noted throughout the small bowel. No evidence of any active GI bleeding  Etiology remains unclear though NSAID related medication effect is most likely.  I called and spoke with the patient about the results of the findings. He remains off ibuprofen.  I recommend a CBC/iron/TIBC/ferritin/ESR/CRP to evaluate for inflammatory markers that could be concerning for enteritis etiology.  I recommend a fecal calprotectin.  Based on these results we will consider the role of CT enterography versus repeat video capsule endoscopy.  Patient agrees with this plan of action and will be coming in within the next 7 to 10 days to have his laboratories completed and stool studies to be completed.   Aloha Finner, MD [facility] Office # [phone]

## 2024-07-27 NOTE — Addendum Note (Signed)
 Addended by: WILHELMENIA ROERS on: 07/27/2024 05:06 PM   Modules accepted: Orders

## 2024-07-30 ENCOUNTER — Ambulatory Visit (HOSPITAL_COMMUNITY)

## 2024-08-07 ENCOUNTER — Telehealth: Payer: Self-pay | Admitting: Gastroenterology

## 2024-08-07 NOTE — Telephone Encounter (Signed)
 The pt has been advised that he needs labs and stool studies. All orders have been entered   He will come in Thursday

## 2024-08-07 NOTE — Telephone Encounter (Signed)
 Inbound call from patient stating that he is aware that he needs to come in for labs and he thought there was something else he needed to do but could not remember. Patient is requesting a call back to discuss. Please advise.

## 2024-08-08 ENCOUNTER — Encounter: Payer: Self-pay | Admitting: Gastroenterology

## 2024-08-09 ENCOUNTER — Other Ambulatory Visit (INDEPENDENT_AMBULATORY_CARE_PROVIDER_SITE_OTHER)

## 2024-08-09 ENCOUNTER — Ambulatory Visit (HOSPITAL_COMMUNITY)
Admission: RE | Admit: 2024-08-09 | Discharge: 2024-08-09 | Disposition: A | Discharge status: Home / Self Care | Source: Ambulatory Visit | Attending: Acute Care | Admitting: Acute Care

## 2024-08-09 ENCOUNTER — Other Ambulatory Visit: Payer: Self-pay

## 2024-08-09 DIAGNOSIS — Z122 Encounter for screening for malignant neoplasm of respiratory organs: Secondary | ICD-10-CM | POA: Insufficient documentation

## 2024-08-09 DIAGNOSIS — K529 Noninfective gastroenteritis and colitis, unspecified: Secondary | ICD-10-CM | POA: Diagnosis not present

## 2024-08-09 DIAGNOSIS — K633 Ulcer of intestine: Secondary | ICD-10-CM

## 2024-08-09 DIAGNOSIS — D62 Acute posthemorrhagic anemia: Secondary | ICD-10-CM

## 2024-08-09 DIAGNOSIS — K625 Hemorrhage of anus and rectum: Secondary | ICD-10-CM

## 2024-08-09 DIAGNOSIS — Z85038 Personal history of other malignant neoplasm of large intestine: Secondary | ICD-10-CM

## 2024-08-09 DIAGNOSIS — Z87891 Personal history of nicotine dependence: Secondary | ICD-10-CM | POA: Diagnosis present

## 2024-08-09 DIAGNOSIS — K921 Melena: Secondary | ICD-10-CM | POA: Diagnosis not present

## 2024-08-09 DIAGNOSIS — Z1509 Genetic susceptibility to other malignant neoplasm: Secondary | ICD-10-CM

## 2024-08-09 DIAGNOSIS — R1032 Left lower quadrant pain: Secondary | ICD-10-CM

## 2024-08-09 DIAGNOSIS — D132 Benign neoplasm of duodenum: Secondary | ICD-10-CM

## 2024-08-09 LAB — IBC + FERRITIN
Ferritin: 33 ng/mL (ref 22.0–322.0)
Iron: 66 ug/dL (ref 42–165)
Saturation Ratios: 12.6 % — ABNORMAL LOW (ref 20.0–50.0)
TIBC: 523.6 ug/dL — ABNORMAL HIGH (ref 250.0–450.0)
Transferrin: 374 mg/dL — ABNORMAL HIGH (ref 212.0–360.0)

## 2024-08-09 LAB — CBC
HCT: 39.8 % (ref 39.0–52.0)
Hemoglobin: 13.4 g/dL (ref 13.0–17.0)
MCHC: 33.5 g/dL (ref 30.0–36.0)
MCV: 86.8 fl (ref 78.0–100.0)
Platelets: 198 K/uL (ref 150.0–400.0)
RBC: 4.59 Mil/uL (ref 4.22–5.81)
RDW: 14.6 % (ref 11.5–15.5)
WBC: 6.2 K/uL (ref 4.0–10.5)

## 2024-08-09 LAB — SEDIMENTATION RATE: Sed Rate: 25 mm/h — ABNORMAL HIGH (ref 0–20)

## 2024-08-09 LAB — C-REACTIVE PROTEIN: CRP: <1 mg/dL (ref 0.5–20.0)

## 2024-08-14 ENCOUNTER — Ambulatory Visit: Payer: Self-pay | Admitting: Gastroenterology

## 2024-08-15 ENCOUNTER — Other Ambulatory Visit: Payer: Self-pay

## 2024-08-15 DIAGNOSIS — Z85038 Personal history of other malignant neoplasm of large intestine: Secondary | ICD-10-CM

## 2024-08-15 DIAGNOSIS — K625 Hemorrhage of anus and rectum: Secondary | ICD-10-CM

## 2024-08-15 DIAGNOSIS — K921 Melena: Secondary | ICD-10-CM

## 2024-08-15 DIAGNOSIS — R1032 Left lower quadrant pain: Secondary | ICD-10-CM

## 2024-08-21 ENCOUNTER — Other Ambulatory Visit: Payer: Self-pay

## 2024-08-21 DIAGNOSIS — Z87891 Personal history of nicotine dependence: Secondary | ICD-10-CM

## 2024-08-21 DIAGNOSIS — Z122 Encounter for screening for malignant neoplasm of respiratory organs: Secondary | ICD-10-CM

## 2024-08-29 ENCOUNTER — Ambulatory Visit: Admitting: Family Medicine

## 2024-09-05 ENCOUNTER — Ambulatory Visit (INDEPENDENT_AMBULATORY_CARE_PROVIDER_SITE_OTHER): Admitting: Family Medicine

## 2024-09-05 ENCOUNTER — Encounter: Payer: Self-pay | Admitting: Family Medicine

## 2024-09-05 VITALS — BP 104/62 | HR 62 | Temp 98.3°F | Ht 70.0 in | Wt 238.0 lb

## 2024-09-05 DIAGNOSIS — J449 Chronic obstructive pulmonary disease, unspecified: Secondary | ICD-10-CM | POA: Diagnosis not present

## 2024-09-05 DIAGNOSIS — I10 Essential (primary) hypertension: Secondary | ICD-10-CM | POA: Diagnosis not present

## 2024-09-05 DIAGNOSIS — M5416 Radiculopathy, lumbar region: Secondary | ICD-10-CM | POA: Diagnosis not present

## 2024-09-07 NOTE — Progress Notes (Signed)
 Subjective:  Patient ID: Charles Santos, male    DOB: [birth year]  Age: 53 y.o. MRN: [id number]  CC: Medical Management of Chronic Issues (Follow up CT scan/Chronic pain)   HPI  Discussed the use of AI scribe software for clinical note transcription with the patient, who gave verbal consent to proceed.  History of Present Illness Charles Santos is a 53 year old male with emphysema and spinal arthritis who presents with right-sided hip and back pain.  He experiences right-sided pain that extends into his hip, worsening after sitting for 30 minutes to an hour. This makes walking difficult initially, but the pain improves with movement. Occasionally, the pain causes him to limp. He recalls an incident where lying on his left side and attempting to turn over caused a pulling sensation, exacerbating the pain.  He was prescribed nabumetone  in August for this issue, but he misunderstood the dosage and has been taking only two tablets per day. However, he misunderstood the dosage and has been taking only two tablets per day. He is also taking pregabalin  (Lyrica ), two capsules at bedtime, which helps with his back pain.  He has a history of emphysema, having quit smoking about four or five years ago. He mentions a history of arthritis in his spine, noted in previous imaging studies. He also has a gallstone, but it has not caused any recent symptoms such as pain, heartburn, or belching.  He reports recent weight loss, which he perceives as beneficial.          09/05/2024    2:22 PM 09/05/2024    2:21 PM 07/25/2024    9:46 AM  Depression screen PHQ 2/9  Decreased Interest 0 0 0  Down, Depressed, Hopeless 0 0 0  PHQ - 2 Score 0 0 0  Altered sleeping 0    Tired, decreased energy 0    Change in appetite 0    Feeling bad or failure about yourself  0    Trouble concentrating 0    Moving slowly or fidgety/restless 0    Suicidal thoughts 0    PHQ-9 Score 0    Difficult doing work/chores Not  difficult at all      History Angelos has a past medical history of Acute midline low back pain with bilateral sciatica (05/16/2020), Anxiety, Arthritis, Cancer of transverse colon (HCC) (09/17/2016), CAP (community acquired pneumonia) (11/07/2020), Colon cancer (HCC) (1998), Complication of anesthesia, Depression, Emphysema of lung (HCC), Family hx of colon cancer, GERD (gastroesophageal reflux disease), Headache, Hernia, inguinal, Hyperlipidemia, Hypertension, Pneumonia, and Vitamin D  deficiency.   He has a past surgical history that includes colon resectomy (1998); Appendectomy; Laparoscopic subtotal colectomy (N/A, 09/17/2016); Proctoscopy (09/17/2016); Inguinal hernia repair; Upper gi endoscopy; polyp removal at [facility] (2017); Flexible sigmoidoscopy (N/A, 05/19/2017); Esophagogastroduodenoscopy (egd) with propofol  (N/A, 05/19/2017); Colonoscopy; Transforaminal lumbar interbody fusion w/ mis 1 level (Right, 02/02/2021); Polypectomy; Sigmoidoscopy; Upper gastrointestinal endoscopy; Flexible sigmoidoscopy (N/A, 06/04/2021); Esophagogastroduodenoscopy (egd) with propofol  (N/A, 06/04/2021); Flexible sigmoidoscopy (N/A, 08/12/2022); biopsy (08/12/2022); polypectomy (08/12/2022); Back surgery; Inguinal hernia repair (Left, 01/07/2023); Esophagogastroduodenoscopy (egd) with propofol  (N/A, 03/01/2024); Colonoscopy with propofol  (N/A, 03/01/2024); Muscle biopsy (03/01/2024); Esophagogastroduodenoscopy (N/A, 05/29/2024); Givens capsule study (N/A, 05/29/2024); and Biopsy of skin subcutaneous tissue and/or mucous membrane (05/29/2024).   His family history includes Colon cancer in his father.He reports that he quit smoking about 3 years ago. His smoking use included cigarettes. He started smoking about 26 years ago. He has a 23 pack-year smoking history. He has never used smokeless tobacco.  He reports current alcohol use of about 63.0 standard drinks of alcohol per week. He reports that he does not use  drugs.    ROS Review of Systems  Objective:  BP 104/62   Pulse 62   Temp 98.3 F (36.8 C)   Ht 5' 10 (1.778 m)   Wt 238 lb (108 kg)   SpO2 95%   BMI 34.15 kg/m   BP Readings from Last 3 Encounters:  09/05/24 104/62  07/25/24 (!) 120/56  06/05/24 117/66    Wt Readings from Last 3 Encounters:  09/05/24 238 lb (108 kg)  07/25/24 237 lb 12.8 oz (107.9 kg)  06/05/24 236 lb (107 kg)     Physical Exam Physical Exam GENERAL: Alert, cooperative, well developed, no acute distress HEENT: Normocephalic, normal oropharynx, moist mucous membranes CHEST: Clear to auscultation bilaterally, No wheezes, rhonchi, or crackles CARDIOVASCULAR: Normal heart rate and rhythm, S1 and S2 normal without murmurs ABDOMEN: Soft, non-tender, non-distended, without organomegaly, Normal bowel sounds EXTREMITIES: No cyanosis or edema NEUROLOGICAL: Cranial nerves grossly intact, Moves all extremities without gross motor or sensory deficit   Assessment & Plan:  There are no diagnoses linked to this encounter.  Assessment and Plan Assessment & Plan Chronic right-sided low back pain with right-sided sciatica and lumbar radiculopathy   Pain worsens with prolonged sitting and improves with movement. He did not take nabumetone  as directed, possibly leading to poor pain control. Lyrica  (pregabalin ) is taken as prescribed and provides some relief. Instruct him to take nabumetone  2 tablets twice daily as prescribed. Continue Lyrica  2 capsules at bedtime.  Emphysema   Recent imaging confirmed emphysema, consistent with his smoking history. Imaging shows no signs of lung cancer, but continued monitoring is necessary due to his smoking history. The risk of lung cancer persists until 15 years after smoking cessation. Continue annual or biennial imaging to monitor for lung cancer until 15 years post-smoking cessation. Encourage smoking cessation to prevent emphysema progression.       Follow-up: Return in  about 6 months (around 03/05/2025) for [facility] physical.  Butler Der, M.D.

## 2024-09-17 ENCOUNTER — Encounter: Admitting: Family Medicine

## 2024-09-19 ENCOUNTER — Encounter: Admitting: Family Medicine

## 2024-10-15 ENCOUNTER — Encounter: Payer: Self-pay | Admitting: Radiology

## 2024-10-18 ENCOUNTER — Other Ambulatory Visit: Payer: Self-pay | Admitting: Family Medicine

## 2024-10-18 ENCOUNTER — Encounter: Payer: Self-pay | Admitting: Family Medicine

## 2024-10-18 ENCOUNTER — Ambulatory Visit: Payer: Self-pay | Admitting: Family Medicine

## 2024-10-18 ENCOUNTER — Telehealth: Payer: Self-pay | Admitting: Family Medicine

## 2024-10-18 VITALS — BP 132/74 | HR 71 | Temp 98.0°F | Ht 70.0 in | Wt 235.0 lb

## 2024-10-18 DIAGNOSIS — Z15060 Genetic susceptibility to colorectal cancer: Secondary | ICD-10-CM

## 2024-10-18 DIAGNOSIS — Z0000 Encounter for general adult medical examination without abnormal findings: Secondary | ICD-10-CM

## 2024-10-18 DIAGNOSIS — I10 Essential (primary) hypertension: Secondary | ICD-10-CM

## 2024-10-18 DIAGNOSIS — Z0001 Encounter for general adult medical examination with abnormal findings: Secondary | ICD-10-CM | POA: Diagnosis not present

## 2024-10-18 DIAGNOSIS — E782 Mixed hyperlipidemia: Secondary | ICD-10-CM | POA: Diagnosis not present

## 2024-10-18 DIAGNOSIS — E559 Vitamin D deficiency, unspecified: Secondary | ICD-10-CM

## 2024-10-18 DIAGNOSIS — Z1507 Genetic susceptibility to malignant neoplasm of urinary tract: Secondary | ICD-10-CM

## 2024-10-18 DIAGNOSIS — Z125 Encounter for screening for malignant neoplasm of prostate: Secondary | ICD-10-CM

## 2024-10-18 DIAGNOSIS — Z1509 Genetic susceptibility to other malignant neoplasm: Secondary | ICD-10-CM

## 2024-10-18 DIAGNOSIS — Z15068 Genetic susceptibility to other malignant neoplasm of digestive system: Secondary | ICD-10-CM

## 2024-10-18 DIAGNOSIS — K2100 Gastro-esophageal reflux disease with esophagitis, without bleeding: Secondary | ICD-10-CM

## 2024-10-18 DIAGNOSIS — M5417 Radiculopathy, lumbosacral region: Secondary | ICD-10-CM | POA: Diagnosis not present

## 2024-10-18 LAB — LIPID PANEL

## 2024-10-18 LAB — URINALYSIS
Bilirubin, UA: NEGATIVE
Glucose, UA: NEGATIVE
Leukocytes,UA: NEGATIVE
Nitrite, UA: NEGATIVE
Protein,UA: NEGATIVE
RBC, UA: NEGATIVE
Specific Gravity, UA: 1.02 (ref 1.005–1.030)
Urobilinogen, Ur: 0.2 mg/dL (ref 0.2–1.0)
pH, UA: 5.5 (ref 5.0–7.5)

## 2024-10-18 MED ORDER — PREGABALIN 200 MG PO CAPS: 400.0000 mg | ORAL_CAPSULE | Freq: Every day | ORAL | 1 refills | Status: AC

## 2024-10-18 NOTE — Telephone Encounter (Unsigned)
 Copied from CRM #[id number]. Topic: Clinical - Medication Question >> Oct 18, 2024  3:09 PM Yolanda T wrote: Reason for CRM: patient called stated the pharmacy is needing the provider to call and let them know it is ok to fill his script for pregabalin  (LYRICA ) 200 MG capsule ealry. Patient is working 7a-7p Fri, Sat and Sun but will run out of medication on Sat night.

## 2024-10-18 NOTE — Telephone Encounter (Signed)
 Copied from CRM [id number]. Topic: Clinical - Medication Refill >> Oct 18, 2024  9:34 AM Tinnie C wrote: Medication: pregabalin  (LYRICA ) 200 MG capsule   Has the patient contacted their pharmacy? Yes It was too early for refill, but he will be out on Saturday.  This is the patient's preferred pharmacy:  [facility] [place], [place] - [address] [place] [address] Phone: [phone] Fax: [phone]  Is this the correct pharmacy for this prescription? Yes If no, delete pharmacy and type the correct one.   Has the prescription been filled recently? Yes  Is the patient out of the medication? No  Has the patient been seen for an appointment in the last year OR does the patient have an upcoming appointment? Yes  Can we respond through MyChart? No, please call [phone]  Agent: Please be advised that Rx refills may take up to 3 business days. We ask that you follow-up with your pharmacy.

## 2024-10-18 NOTE — Progress Notes (Signed)
 Subjective:  Patient ID: Charles Santos, male    DOB: 02-25-1971  Age: 53 y.o. MRN: [id number]  CC: Annual Exam (Pregabalin  is going to run out on Saturday but pharmacy closed on sat and sun. ) and Groin Pain (Right hip/ legs connects. Happened a month ago while pt was lifting. Pt was pressing on that area yesterday and it is still sore. )   HPI  Discussed the use of AI scribe software for clinical note transcription with the patient, who gave verbal consent to proceed.  History of Present Illness Charles Santos is a 53 year old male with lumbar spondylosis and foramen stenosis who presents with persistent leg pain and medication management.  He experiences a persistent burning sensation in his leg, exacerbated by prolonged sitting or standing. He reports pain in his buttocks and down his leg. A specific spot on his buttock elicits severe pain when pressed. He is currently taking nabumetone , two tablets in the morning and two in the evening, which helps manage his pain and allows him to perform daily activities such as dressing. However, he still experiences muscle tension and difficulty when walking long distances at work, particularly when changing into steel-toed boots. He is also on pregabalin , 200 mg twice a day. He is running low on this medication, with enough supply only until Saturday.  He has a history of [name] and has undergone regular colonoscopies and capsule endoscopies. He reports no recent issues related to this condition. He experiences chronic diarrhea, attributed to having only 18 inches of colon remaining after previous surgeries, and manages this by adjusting his diet, consuming more bread to thicken his stools.  He reports a recent incident at work where lifting a heavy object caused sharp pain in his right groin area, where he had a hernia repair in his late twenties. He noticed a protrusion in the area while showering, raising concerns about a possible  recurrence.  No recent chest pain but notes occasional wheezing and shortness of breath, particularly after walking long distances, which he attributes to being overweight. No issues with urination.          09/05/2024    2:22 PM 09/05/2024    2:21 PM 07/25/2024    9:46 AM  Depression screen PHQ 2/9  Decreased Interest 0 0 0  Down, Depressed, Hopeless 0 0 0  PHQ - 2 Score 0 0 0  Altered sleeping 0    Tired, decreased energy 0    Change in appetite 0    Feeling bad or failure about yourself  0    Trouble concentrating 0    Moving slowly or fidgety/restless 0    Suicidal thoughts 0    PHQ-9 Score 0     Difficult doing work/chores Not difficult at all       Data saved with a previous flowsheet row definition    History Charles Santos has a past medical history of Acute midline low back pain with bilateral sciatica (05/16/2020), Anxiety, Arthritis, Cancer of transverse colon (HCC) (09/17/2016), CAP (community acquired pneumonia) (11/07/2020), Colon cancer (HCC) (1998), Complication of anesthesia, Depression, Emphysema of lung (HCC), Family hx of colon cancer, GERD (gastroesophageal reflux disease), Headache, Hernia, inguinal, Hyperlipidemia, Hypertension, Pneumonia, Sepsis (HCC) (11/07/2020), and Vitamin D  deficiency.   He has a past surgical history that includes colon resectomy (1998); Appendectomy; Laparoscopic subtotal colectomy (N/A, 09/17/2016); Proctoscopy (09/17/2016); Inguinal hernia repair; Upper gi endoscopy; polyp removal at [facility] (2017); Flexible sigmoidoscopy (N/A, 05/19/2017); Esophagogastroduodenoscopy (egd) with propofol  (  N/A, 05/19/2017); Colonoscopy; Transforaminal lumbar interbody fusion w/ mis 1 level (Right, 02/02/2021); Polypectomy; Sigmoidoscopy; Upper gastrointestinal endoscopy; Flexible sigmoidoscopy (N/A, 06/04/2021); Esophagogastroduodenoscopy (egd) with propofol  (N/A, 06/04/2021); Flexible sigmoidoscopy (N/A, 08/12/2022); biopsy (08/12/2022); polypectomy  (08/12/2022); Back surgery; Inguinal hernia repair (Left, 01/07/2023); Esophagogastroduodenoscopy (egd) with propofol  (N/A, 03/01/2024); Colonoscopy with propofol  (N/A, 03/01/2024); Muscle biopsy (03/01/2024); Esophagogastroduodenoscopy (N/A, 05/29/2024); Givens capsule study (N/A, 05/29/2024); and Biopsy of skin subcutaneous tissue and/or mucous membrane (05/29/2024).   His family history includes Colon cancer in his father.He reports that he quit smoking about 4 years ago. His smoking use included cigarettes. He started smoking about 27 years ago. He has a 23 pack-year smoking history. He has never used smokeless tobacco. He reports current alcohol use of about 63.0 standard drinks of alcohol per week. He reports that he does not use drugs.    ROS Review of Systems  Constitutional:  Negative for activity change, fatigue and unexpected weight change.  HENT:  Negative for congestion, ear pain, hearing loss, postnasal drip and trouble swallowing.   Eyes:  Negative for pain and visual disturbance.  Respiratory:  Positive for shortness of breath. Negative for cough and chest tightness.   Cardiovascular:  Negative for chest pain, palpitations and leg swelling.  Gastrointestinal:  Positive for abdominal pain and diarrhea. Negative for abdominal distention, blood in stool, constipation, nausea and vomiting.  Endocrine: Negative for cold intolerance, heat intolerance and polydipsia.  Genitourinary:  Negative for difficulty urinating, dysuria, flank pain, frequency and urgency.  Musculoskeletal:  Positive for back pain. Negative for arthralgias and joint swelling.  Skin:  Negative for color change, rash and wound.  Neurological:  Negative for dizziness, syncope, speech difficulty, weakness, light-headedness, numbness and headaches.  Hematological:  Does not bruise/bleed easily.  Psychiatric/Behavioral:  Negative for confusion, decreased concentration, dysphoric mood and sleep disturbance. The patient is not  nervous/anxious.     Objective:  BP 132/74   Pulse 71   Temp 98 F (36.7 C)   Ht 5' 10 (1.778 m)   Wt 235 lb (106.6 kg)   SpO2 98%   BMI 33.72 kg/m   BP Readings from Last 3 Encounters:  10/18/24 132/74  09/05/24 104/62  07/25/24 (!) 120/56    Wt Readings from Last 3 Encounters:  10/18/24 235 lb (106.6 kg)  09/05/24 238 lb (108 kg)  07/25/24 237 lb 12.8 oz (107.9 kg)     Physical Exam Constitutional:      Appearance: He is well-developed.  HENT:     Head: Normocephalic and atraumatic.  Eyes:     Pupils: Pupils are equal, round, and reactive to light.  Neck:     Thyroid : No thyromegaly.     Trachea: No tracheal deviation.  Cardiovascular:     Rate and Rhythm: Normal rate and regular rhythm.     Heart sounds: Normal heart sounds. No murmur heard.    No friction rub. No gallop.  Pulmonary:     Breath sounds: Normal breath sounds. No wheezing or rales.  Abdominal:     General: Bowel sounds are normal. There is no distension.     Palpations: Abdomen is soft. There is no mass.     Tenderness: There is no abdominal tenderness.     Hernia: There is no hernia in the left inguinal area.  Genitourinary:    Penis: Normal.      Testes: Normal.  Musculoskeletal:        General: Normal range of motion.     Cervical back:  Normal range of motion.  Lymphadenopathy:     Cervical: No cervical adenopathy.  Skin:    General: Skin is warm and dry.  Neurological:     Mental Status: He is alert and oriented to person, place, and time.    Physical Exam GENERAL: Alert, cooperative, well developed, no acute distress. HEENT: Normocephalic, normal oropharynx, moist mucous membranes. CHEST: Clear to auscultation bilaterally, no wheezes, rhonchi, or crackles. CARDIOVASCULAR: Normal heart rate and rhythm, S1 and S2 normal without murmurs. ABDOMEN: Soft, non-tender, non-distended, without organomegaly, normal bowel sounds. GENITOURINARY: Palpable protrusion in right groin  area. EXTREMITIES: No cyanosis or edema. NEUROLOGICAL: Cranial nerves grossly intact, moves all extremities without gross motor or sensory deficit.   Assessment & Plan:  Well adult exam -     CBC with Differential/Platelet -     CMP14+EGFR -     Urinalysis  Radiculopathy of lumbosacral region -     CBC with Differential/Platelet -     CMP14+EGFR  Mixed hyperlipidemia -     CBC with Differential/Platelet -     CMP14+EGFR -     Lipid panel  [name] -     CBC with Differential/Platelet -     CMP14+EGFR  Essential hypertension -     CBC with Differential/Platelet -     CMP14+EGFR -     Urinalysis  Gastroesophageal reflux disease with esophagitis without hemorrhage -     CBC with Differential/Platelet -     CMP14+EGFR  Screening for prostate cancer -     PSA, total and free  Vitamin D  deficiency -     VITAMIN D  25 Hydroxy (Vit-D Deficiency, Fractures)  Other orders -     Pregabalin ; Take 2 capsules (400 mg total) by mouth at bedtime.  Dispense: 180 capsule; Refill: 1    Assessment and Plan Assessment & Plan Adult Wellness Visit   This routine wellness visit focused on ongoing management of chronic conditions. Continue current medications and management plans.  Chronic right-sided low back pain with right-sided sciatica and lumbar radiculopathy due to lumbar spondylosis and foraminal stenosis   He experiences persistent burning sensation in the leg and hip pain, likely due to lumbar spondylosis and foraminal stenosis, exacerbated by prolonged sitting or standing. Nabumetone  provides some relief. Pregabalin  dosage increased to 400 mg daily. No current orthopedic follow-up. Continue nabumetone  2 tablets twice daily and pregabalin  200 mg twice daily. Referred to orthopedist for further evaluation of hip and back pain.  Genetic susceptibility to colorectal cancer ([name])   No new issues related to [name]. Regular surveillance with flexible  sigmoidoscopy and capsule endoscopy. Due for next colonoscopy in March. Will schedule colonoscopy in March and follow up with Dr. Darilyn office for scheduling.  Chronic diarrhea secondary to prior colon resection   Chronic diarrhea persists due to prior colon resection. Manages symptoms by dietary adjustments, such as increasing bread intake. Continue dietary management to manage diarrhea.  Emphysema   Intermittent wheezing, particularly with exertion. No recent chest pain or significant shortness of breath.       Follow-up: Return in about 6 months (around 04/17/2025).  Butler Der, M.D.

## 2024-10-19 LAB — CBC WITH DIFFERENTIAL/PLATELET
Basophils Absolute: 0.1 x10E3/uL (ref 0.0–0.2)
Basos: 1 %
EOS (ABSOLUTE): 0.2 x10E3/uL (ref 0.0–0.4)
Eos: 3 %
Hematocrit: 46.7 % (ref 37.5–51.0)
Hemoglobin: 15.3 g/dL (ref 13.0–17.7)
Immature Grans (Abs): 0 x10E3/uL (ref 0.0–0.1)
Immature Granulocytes: 0 %
Lymphocytes Absolute: 2.6 x10E3/uL (ref 0.7–3.1)
Lymphs: 37 %
MCH: 29 pg (ref 26.6–33.0)
MCHC: 32.8 g/dL (ref 31.5–35.7)
MCV: 89 fL (ref 79–97)
Monocytes Absolute: 0.7 x10E3/uL (ref 0.1–0.9)
Monocytes: 10 %
Neutrophils Absolute: 3.4 x10E3/uL (ref 1.4–7.0)
Neutrophils: 49 %
Platelets: 201 x10E3/uL (ref 150–450)
RBC: 5.27 x10E6/uL (ref 4.14–5.80)
RDW: 15.5 % — ABNORMAL HIGH (ref 11.6–15.4)
WBC: 7 x10E3/uL (ref 3.4–10.8)

## 2024-10-19 LAB — CMP14+EGFR
ALT: 39 IU/L (ref 0–44)
AST: 44 IU/L — ABNORMAL HIGH (ref 0–40)
Albumin: 4.4 g/dL (ref 3.8–4.9)
Alkaline Phosphatase: 73 IU/L (ref 47–123)
BUN/Creatinine Ratio: 16 (ref 9–20)
BUN: 17 mg/dL (ref 6–24)
Bilirubin Total: 0.4 mg/dL (ref 0.0–1.2)
CO2: 25 mmol/L (ref 20–29)
Calcium: 9.5 mg/dL (ref 8.7–10.2)
Chloride: 99 mmol/L (ref 96–106)
Creatinine, Ser: 1.05 mg/dL (ref 0.76–1.27)
Globulin, Total: 2.8 g/dL (ref 1.5–4.5)
Glucose: 99 mg/dL (ref 70–99)
Potassium: 4.1 mmol/L (ref 3.5–5.2)
Sodium: 138 mmol/L (ref 134–144)
Total Protein: 7.2 g/dL (ref 6.0–8.5)
eGFR: 85 mL/min/1.73 (ref >59)

## 2024-10-19 LAB — LIPID PANEL
Cholesterol, Total: 147 mg/dL (ref 100–199)
HDL: 46 mg/dL (ref >39)
LDL CALC COMMENT:: 3.2 ratio (ref 0.0–5.0)
LDL Chol Calc (NIH): 35 mg/dL (ref 0–99)
Triglycerides: 469 mg/dL — AB (ref 0–149)
VLDL Cholesterol Cal: 66 mg/dL — AB (ref 5–40)

## 2024-10-19 LAB — VITAMIN D 25 HYDROXY (VIT D DEFICIENCY, FRACTURES): Vit D, 25-Hydroxy: 23.6 ng/mL — AB (ref 30.0–100.0)

## 2024-10-19 LAB — PSA, TOTAL AND FREE
PSA, Free Pct: 60 %
PSA, Free: 0.48 ng/mL
Prostate Specific Ag, Serum: 0.8 ng/mL (ref 0.0–4.0)

## 2024-10-22 ENCOUNTER — Other Ambulatory Visit: Payer: Self-pay

## 2024-10-22 ENCOUNTER — Ambulatory Visit: Payer: Self-pay | Admitting: Family Medicine

## 2024-10-22 ENCOUNTER — Ambulatory Visit: Payer: Self-pay

## 2024-10-22 ENCOUNTER — Other Ambulatory Visit: Payer: Self-pay | Admitting: Family Medicine

## 2024-10-22 DIAGNOSIS — E782 Mixed hyperlipidemia: Secondary | ICD-10-CM

## 2024-10-22 DIAGNOSIS — R1031 Right lower quadrant pain: Secondary | ICD-10-CM

## 2024-10-22 NOTE — Telephone Encounter (Signed)
 Copied from CRM [id number]. Topic: Clinical - Request for Lab/Test Order >> Oct 22, 2024  1:50 PM Rosaria BRAVO wrote: Reason for CRM: pt called for update on sonogram, says he discussed this with his PCP.

## 2024-10-22 NOTE — Telephone Encounter (Signed)
 [place] ordered

## 2024-10-22 NOTE — Telephone Encounter (Signed)
 Asked to review labs again, verbalizes understanding.    Copied from CRM [id number]. Topic: Clinical - Lab/Test Results >> Oct 22, 2024  1:51 PM Rosaria BRAVO wrote: Reason for CRM: Has questions about his labs Reason for Disposition  Health information question, no triage required and triager able to answer question  Answer Assessment - Initial Assessment Questions 1. REASON FOR CALL: What is the main reason for your call? or How can I best help you?     Asked to review labs again. 2. SYMPTOMS : Do you have any symptoms?      N/a 3. OTHER QUESTIONS: Do you have any other questions?     no  Protocols used: Information Only Call - No Triage-A-AH

## 2024-10-22 NOTE — Telephone Encounter (Signed)
 Completed 10/18/24 in an orders only encounter

## 2024-10-23 NOTE — Telephone Encounter (Signed)
Pt notified.    LS

## 2024-10-26 ENCOUNTER — Telehealth: Payer: Self-pay | Admitting: Family Medicine

## 2024-10-26 ENCOUNTER — Other Ambulatory Visit

## 2024-10-26 DIAGNOSIS — E782 Mixed hyperlipidemia: Secondary | ICD-10-CM

## 2024-10-26 LAB — LIPID PANEL
Chol/HDL Ratio: 2.8 ratio (ref 0.0–5.0)
Cholesterol, Total: 139 mg/dL (ref 100–199)
HDL: 49 mg/dL (ref >39)
LDL Chol Calc (NIH): 64 mg/dL (ref 0–99)
Triglycerides: 155 mg/dL — ABNORMAL HIGH (ref 0–149)
VLDL Cholesterol Cal: 26 mg/dL (ref 5–40)

## 2024-10-26 NOTE — Telephone Encounter (Signed)
 REFERRAL REQUEST Telephone Note  Have you been seen at our office for this problem? yes (Advise that they may need an appointment with their PCP before a referral can be done)  Reason for Referral: pelvic pain/ultrasound needed Referral discussed with patient: yes  Best contact number of patient for referral team: [phone]    Has patient been seen by a specialist for this issue before: a very long time ago  Patient provider preference for referral: doesn't matter Patient location preference for referral: none   Patient notified that referrals can take up to a week or longer to process. If they haven't heard anything within a week they should call back and speak with the referral department.

## 2024-10-28 ENCOUNTER — Ambulatory Visit: Payer: Self-pay | Admitting: Family Medicine

## 2024-10-28 NOTE — Progress Notes (Signed)
Hello [name],  Your lab result is normal and/or stable.Some minor variations that are not significant are commonly marked abnormal, but do not represent any medical problem for you.  Best regards, Warren Stacks, M.D.

## 2024-10-30 NOTE — Telephone Encounter (Signed)
 LM for Patient in regards to US  Appt.

## 2024-11-05 ENCOUNTER — Other Ambulatory Visit: Payer: Self-pay | Admitting: Family Medicine

## 2024-11-05 ENCOUNTER — Ambulatory Visit (HOSPITAL_COMMUNITY)
Admission: RE | Admit: 2024-11-05 | Discharge: 2024-11-05 | Disposition: A | Discharge status: Home / Self Care | Source: Ambulatory Visit | Attending: Family Medicine | Admitting: Family Medicine

## 2024-11-05 DIAGNOSIS — R1031 Right lower quadrant pain: Secondary | ICD-10-CM | POA: Diagnosis present

## 2024-11-06 ENCOUNTER — Telehealth: Payer: Self-pay

## 2024-11-06 NOTE — Telephone Encounter (Signed)
 Spoke with pt, he had one ultrasound done yesterday but there is another ultrasound order in his chart for scrotum US . Informed pt I will send a message to Dr Zollie to see if he needs this ultrasound done as well because I do not see any documentation where someone would have called him other than on 11/18 when referral team contacted him about the US  he already had done. Pt verbalized understanding

## 2024-11-06 NOTE — Telephone Encounter (Signed)
 Copied from CRM #[id number]. Topic: Clinical - Medical Advice >> Nov 06, 2024 12:44 PM Harlene ORN wrote: Reason for CRM: left a voicemail saying PCP scheduled him an ultrasound at [facility], but he's already had it done on 11/24. Please call back patient if he's meant to get another ultrasound.

## 2024-11-06 NOTE — Telephone Encounter (Signed)
 Contacted patient. Notified patient. Patient verbalized understanding.

## 2024-11-06 NOTE — Telephone Encounter (Signed)
 No need to go for the second test

## 2024-11-12 ENCOUNTER — Ambulatory Visit: Payer: Self-pay | Admitting: Family Medicine

## 2024-11-13 ENCOUNTER — Ambulatory Visit (HOSPITAL_COMMUNITY)

## 2024-11-14 NOTE — Telephone Encounter (Signed)
 Copied from CRM [id number]. Topic: Clinical - Lab/Test Results >> Nov 14, 2024 12:35 PM Larissa S wrote: Reason for CRM: Returning call for US  results. Patient request a callback.

## 2024-11-28 ENCOUNTER — Other Ambulatory Visit: Payer: Self-pay

## 2024-11-28 ENCOUNTER — Ambulatory Visit: Admitting: Physical Therapy

## 2024-11-28 DIAGNOSIS — R293 Abnormal posture: Secondary | ICD-10-CM | POA: Insufficient documentation

## 2024-11-28 DIAGNOSIS — M62830 Muscle spasm of back: Secondary | ICD-10-CM | POA: Insufficient documentation

## 2024-11-28 DIAGNOSIS — M5459 Other low back pain: Secondary | ICD-10-CM | POA: Diagnosis present

## 2024-11-28 NOTE — Therapy (Signed)
 OUTPATIENT PHYSICAL THERAPY THORACOLUMBAR EVALUATION   Patient Name: Charles Santos MRN: [id number] DOB:1971/09/06, 53 y.o., male Today's Date: 11/28/2024  END OF SESSION:  PT End of Session - 11/28/24 1410     Visit Number 1    Number of Visits 8    Date for Recertification  01/02/25    PT Start Time 0144    PT Stop Time 0230    PT Time Calculation (min) 46 min    Activity Tolerance Patient tolerated treatment well    Behavior During Therapy [facility] for tasks assessed/performed          Past Medical History:  Diagnosis Date   Acute midline low back pain with bilateral sciatica 05/16/2020   Anxiety    Arthritis    Cancer of transverse colon (HCC) 09/17/2016   surgery done   CAP (community acquired pneumonia) 11/07/2020   Colon cancer (HCC) 1998   surgery and chemo   Complication of anesthesia    not fully under for polyp removal at [facility] 2017   Depression    Emphysema of lung (HCC)    Family hx of colon cancer    GERD (gastroesophageal reflux disease)    none recent   Headache    Hernia, inguinal    Left   Hyperlipidemia    Hypertension    Pneumonia    Sepsis (HCC) 11/07/2020   Vitamin D  deficiency    Past Surgical History:  Procedure Laterality Date   APPENDECTOMY     BACK SURGERY     L4-L5 minimally invasive transforaminal lumbar interbody fusion with bilateral L4-L5 pedicle screw placement   BIOPSY  08/12/2022   Procedure: BIOPSY;  Surgeon: Wilhelmenia Aloha Raddle., MD;  Location: [facility];  Service: Gastroenterology;;   BIOPSY OF SKIN SUBCUTANEOUS TISSUE AND/OR MUCOUS MEMBRANE  05/29/2024   Procedure: BIOPSY, SKIN, SUBCUTANEOUS TISSUE, OR MUCOUS MEMBRANE;  Surgeon: San Sandor GAILS, DO;  Location: [facility];  Service: Gastroenterology;;   colon resectomy  1998   2 degree herida   COLONOSCOPY     COLONOSCOPY WITH PROPOFOL  N/A 03/01/2024   Procedure: COLONOSCOPY WITH PROPOFOL ;  Surgeon: Wilhelmenia Aloha Raddle., MD;  Location: [facility];   Service: Gastroenterology;  Laterality: N/A;   ESOPHAGOGASTRODUODENOSCOPY N/A 05/29/2024   Procedure: EGD (ESOPHAGOGASTRODUODENOSCOPY);  Surgeon: San Sandor GAILS, DO;  Location: [facility];  Service: Gastroenterology;  Laterality: N/A;   ESOPHAGOGASTRODUODENOSCOPY (EGD) WITH PROPOFOL  N/A 05/19/2017   Procedure: ESOPHAGOGASTRODUODENOSCOPY (EGD) WITH PROPOFOL ;  Surgeon: Teressa Toribio SQUIBB, MD;  Location: [facility];  Service: Endoscopy;  Laterality: N/A;   ESOPHAGOGASTRODUODENOSCOPY (EGD) WITH PROPOFOL  N/A 06/04/2021   Procedure: ESOPHAGOGASTRODUODENOSCOPY (EGD) WITH PROPOFOL ;  Surgeon: Teressa Toribio SQUIBB, MD;  Location: [facility];  Service: Endoscopy;  Laterality: N/A;   ESOPHAGOGASTRODUODENOSCOPY (EGD) WITH PROPOFOL  N/A 03/01/2024   Procedure: ESOPHAGOGASTRODUODENOSCOPY (EGD) WITH PROPOFOL ;  Surgeon: Wilhelmenia Aloha Raddle., MD;  Location: [facility];  Service: Gastroenterology;  Laterality: N/A;   FLEXIBLE SIGMOIDOSCOPY N/A 05/19/2017   Procedure: FLEXIBLE SIGMOIDOSCOPY;  Surgeon: Teressa Toribio SQUIBB, MD;  Location: [facility];  Service: Endoscopy;  Laterality: N/A;   FLEXIBLE SIGMOIDOSCOPY N/A 06/04/2021   Procedure: FLEXIBLE SIGMOIDOSCOPY;  Surgeon: Teressa Toribio SQUIBB, MD;  Location: [facility];  Service: Endoscopy;  Laterality: N/A;   FLEXIBLE SIGMOIDOSCOPY N/A 08/12/2022   Procedure: FLEXIBLE SIGMOIDOSCOPY;  Surgeon: Wilhelmenia Aloha Raddle., MD;  Location: [facility];  Service: Gastroenterology;  Laterality: N/A;   GIVENS CAPSULE STUDY N/A 05/29/2024   Procedure: IMAGING PROCEDURE, GI TRACT, INTRALUMINAL, VIA CAPSULE;  Surgeon: San Sandor GAILS, DO;  Location: [facility];  Service: Gastroenterology;  Laterality: N/A;   INGUINAL HERNIA REPAIR     INGUINAL HERNIA REPAIR Left 01/07/2023   Procedure: OPEN LEFT INGUINAL HERNIA REPAIR;  Surgeon: Signe Mitzie LABOR, MD;  Location: [facility];  Service: General;  Laterality: Left;   LAPAROSCOPIC SUBTOTAL COLECTOMY N/A 09/17/2016   Procedure:  DIAGNOSTIC LAPAROSCOPY EXPLORATORY LAPAROTOMY SUBTOTAL COLECTOMY PROCTOSCOPY;  Surgeon: Elon Pacini, MD;  Location: [facility];  Service: General;  Laterality: N/A;   MUSCLE BIOPSY  03/01/2024   Procedure: BIOPSY;  Surgeon: Wilhelmenia Aloha Raddle., MD;  Location: [facility];  Service: Gastroenterology;;   polyp removal at [facility]  2017   POLYPECTOMY     POLYPECTOMY  08/12/2022   Procedure: POLYPECTOMY;  Surgeon: Wilhelmenia Aloha Raddle., MD;  Location: [facility];  Service: Gastroenterology;;   PROCTOSCOPY  09/17/2016   Procedure: PROCTOSCOPY;  Surgeon: Elon Pacini, MD;  Location: [facility];  Service: General;;   SIGMOIDOSCOPY     TRANSFORAMINAL LUMBAR INTERBODY FUSION W/ MIS 1 LEVEL Right 02/02/2021   Procedure: Right Lumbar four -lumbar five minimally invasive transforaminal lumbar interbody fusion;  Surgeon: Cheryle Debby LABOR, MD;  Location: [facility];  Service: Neurosurgery;  Laterality: Right;   UPPER GASTROINTESTINAL ENDOSCOPY     UPPER GI ENDOSCOPY     Patient Active Problem List   Diagnosis Date Noted   Gastritis and gastroduodenitis 05/29/2024   Barrett's esophagus without dysplasia 05/29/2024   History of colon cancer 05/29/2024   Acute lower GI bleeding 05/28/2024   Personal history of colon cancer 03/01/2024   Cervical disc disorder with radiculopathy 09/02/2023   Spinal stenosis in cervical region 09/02/2023   Gastroesophageal reflux disease with esophagitis without hemorrhage 01/13/2023   Difficult intubation 02/23/2021   Elevated LFTs 02/07/2021   Lumbar radiculopathy 02/02/2021   Hyponatremia 11/07/2020   Hypokalemia 11/07/2020   Foraminal stenosis of lumbar region 12/28/2019   Lumbar spondylolysis 12/28/2019   Radiculopathy of lumbosacral region 12/05/2019   Essential hypertension 09/26/2019   Chronic right-sided low back pain with right-sided sciatica 09/26/2019   Recurrent right inguinal hernia 06/26/2019   Hypertriglyceridemia 11/24/2018   Anxiety  02/16/2018   Elevated CEA    [name]    Cancer of transverse colon (HCC) 09/17/2016   S/P colonoscopic polypectomy    Hematochezia    Depression 06/01/2016   Alcohol abuse, in remission 06/01/2016   Tobacco abuse 06/01/2016   Vitamin D  deficiency 10/17/2015   Hyperlipidemia 10/17/2015   History of malignant neoplasm of colon without staging 09/22/2010    REFERRING PROVIDER: Dorn FABIENE Glade MD  REFERRING DIAG: Lumbar pseudoarthrosis  Rationale for Evaluation and Treatment: Rehabilitation  THERAPY DIAG:  Other low back pain  Abnormal posture  Muscle spasm of back  ONSET DATE: 2 months ago.  SUBJECTIVE:  SUBJECTIVE STATEMENT: The patient presents to the clinic with c/o right-sided low back pain with radiation into his right buttock, posterior thigh and calf.  He states this began about 2 months ago after helping his son lift a bed of a truck. While seated his pain is rated at a 4/10 but rise to much higher levels when standing and walking.    PERTINENT HISTORY:  Prior lumbar surgery.    PAIN:  Are you having pain? Yes: NPRS scale: 4/10. Pain location: Right low back.  Pain description: Ache, throbbing, shooting.   Aggravating factors: As above.   Relieving factors: Sitting.    PRECAUTIONS: None  RED FLAGS: None   WEIGHT BEARING RESTRICTIONS: No  FALLS:  Has patient fallen in last 6 months? Yes. Number of falls 1.    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    PLOF: Independent  PATIENT GOALS: Decrease pain and move better.     OBJECTIVE:   PATIENT SURVEYS:  ODI:  23/50.   POSTURE: rounded shoulders, forward head, and flexed trunk   PALPATION: Very tender to palpation  over right lower lumbar/SIJ region.   LUMBAR ROM:   Full flexion and extension limited to 10 degrees.   LOWER EXTREMITY MMT:    Right hip flexion decreased (4/5) in response to pain.    LUMBAR SPECIAL TESTS:  Pain with a right SLR and FABER test.     GAIT: When standing from chair and beginning to walk his gait was very antalgic with a notable limp.    TREATMENT DATE: 11/28/24:  HMP and IFC at 80-150 Hz on 40% scanx 20 minutes.    Normal modality resposne following removal of modality.                                                                                                                                 PATIENT EDUCATION:  Education details:  Person educated:  International aid/development worker:  Education comprehension:   HOME EXERCISE PROGRAM:   ASSESSMENT:  CLINICAL IMPRESSION: The patient presents to [facility] with c/o right-sided low back pain and radiation into his right buttock, posterior thigh and calf.  When rising from a seated position his gait was very antalgic in nature.  He had pain reproduction with a right SLR and FABER test.  He is very tender to palpation over right lower lumbar/SIJ region. His ODI score is 23/50.    OBJECTIVE IMPAIRMENTS: Abnormal gait, decreased activity tolerance, difficulty walking, decreased ROM, increased muscle spasms, postural dysfunction, and pain.   ACTIVITY LIMITATIONS: carrying, lifting, bending, standing, transfers, and locomotion level  PARTICIPATION LIMITATIONS: meal prep, cleaning, laundry, occupation, and yard work  PERSONAL FACTORS: Time since onset of injury/illness/exacerbation and 1 comorbidity: prior lumbar surgery are also affecting patient's functional outcome.   REHAB POTENTIAL: Fair /Fair+  CLINICAL DECISION MAKING: Evolving/moderate complexity  EVALUATION COMPLEXITY: Low  GOALS:  SHORT TERM GOALS: Target date: 01/02/25  Ind with a HEP. Goal status: INITIAL  2.  Eliminate right LE pain.  Goal status:  INITIAL  3.  Perform ADL's with pain not > 4/10.  Goal status: INITIAL  4.  Improve LEFS score by at least 5 points.  Goal status: INITIAL  Goal status: INITIAL  PLAN:  PT FREQUENCY: 2x/week  PT DURATION: 4 weeks  PLANNED INTERVENTIONS: 97110-Therapeutic exercises, 97530- Therapeutic activity, [id number]- Neuromuscular re-education, 97535- Self Care, [id number]- Manual therapy, and Patient/Family education.  Electrical stimulation, ultrasound, moist heat.    PLAN FOR NEXT SESSION: Modalities and STW/M as needed.  Core exercise progression.  Body mechanics training and spinal protection techniques.      Jaisen Wiltrout, PT 11/28/2024, 3:36 PM

## 2024-12-03 ENCOUNTER — Ambulatory Visit: Admitting: Physical Therapy

## 2024-12-03 ENCOUNTER — Encounter: Payer: Self-pay | Admitting: Physical Therapy

## 2024-12-03 DIAGNOSIS — M5459 Other low back pain: Secondary | ICD-10-CM

## 2024-12-03 DIAGNOSIS — R293 Abnormal posture: Secondary | ICD-10-CM

## 2024-12-03 DIAGNOSIS — M62830 Muscle spasm of back: Secondary | ICD-10-CM

## 2024-12-03 NOTE — Therapy (Signed)
 " OUTPATIENT PHYSICAL THERAPY TREATMENT   Patient Name: Charles Santos MRN: [id number] DOB:[birth year], 53 y.o., male Today's Date: 12/03/2024  END OF SESSION:  PT End of Session - 12/03/24 1515     Visit Number 2    Number of Visits 8    Date for Recertification  01/02/25    PT Start Time 1515    PT Stop Time 1600    PT Time Calculation (min) 45 min    Activity Tolerance Patient tolerated treatment well    Behavior During Therapy [facility] for tasks assessed/performed           Past Medical History:  Diagnosis Date   Acute midline low back pain with bilateral sciatica 05/16/2020   Anxiety    Arthritis    Cancer of transverse colon (HCC) 09/17/2016   surgery done   CAP (community acquired pneumonia) 11/07/2020   Colon cancer (HCC) 1998   surgery and chemo   Complication of anesthesia    not fully under for polyp removal at [facility] 2017   Depression    Emphysema of lung (HCC)    Family hx of colon cancer    GERD (gastroesophageal reflux disease)    none recent   Headache    Hernia, inguinal    Left   Hyperlipidemia    Hypertension    Pneumonia    Sepsis (HCC) 11/07/2020   Vitamin D  deficiency    Past Surgical History:  Procedure Laterality Date   APPENDECTOMY     BACK SURGERY     L4-L5 minimally invasive transforaminal lumbar interbody fusion with bilateral L4-L5 pedicle screw placement   BIOPSY  08/12/2022   Procedure: BIOPSY;  Surgeon: Wilhelmenia Aloha Raddle., MD;  Location: [facility];  Service: Gastroenterology;;   BIOPSY OF SKIN SUBCUTANEOUS TISSUE AND/OR MUCOUS MEMBRANE  05/29/2024   Procedure: BIOPSY, SKIN, SUBCUTANEOUS TISSUE, OR MUCOUS MEMBRANE;  Surgeon: San Sandor GAILS, DO;  [facility];  Service: Gastroenterology;;   colon resectomy  1998   2 degree herida   COLONOSCOPY     COLONOSCOPY WITH PROPOFOL  N/A 03/01/2024   Procedure: COLONOSCOPY WITH PROPOFOL ;  Surgeon: Wilhelmenia Aloha Raddle., MD;  Location: [facility];  Service:  Gastroenterology;  Laterality: N/A;   ESOPHAGOGASTRODUODENOSCOPY N/A 05/29/2024   Procedure: EGD (ESOPHAGOGASTRODUODENOSCOPY);  Surgeon: San Sandor GAILS, DO;  [facility];  Service: Gastroenterology;  Laterality: N/A;   ESOPHAGOGASTRODUODENOSCOPY (EGD) WITH PROPOFOL  N/A 05/19/2017   Procedure: ESOPHAGOGASTRODUODENOSCOPY (EGD) WITH PROPOFOL ;  Surgeon: Teressa Toribio SQUIBB, MD;  [facility];  Service: Endoscopy;  Laterality: N/A;   ESOPHAGOGASTRODUODENOSCOPY (EGD) WITH PROPOFOL  N/A 06/04/2021   Procedure: ESOPHAGOGASTRODUODENOSCOPY (EGD) WITH PROPOFOL ;  Surgeon: Teressa Toribio SQUIBB, MD;  [facility];  Service: Endoscopy;  Laterality: N/A;   ESOPHAGOGASTRODUODENOSCOPY (EGD) WITH PROPOFOL  N/A 03/01/2024   Procedure: ESOPHAGOGASTRODUODENOSCOPY (EGD) WITH PROPOFOL ;  Surgeon: Wilhelmenia Aloha Raddle., MD;  [facility];  Service: Gastroenterology;  Laterality: N/A;   FLEXIBLE SIGMOIDOSCOPY N/A 05/19/2017   Procedure: FLEXIBLE SIGMOIDOSCOPY;  Surgeon: Teressa Toribio SQUIBB, MD;  [facility];  Service: Endoscopy;  Laterality: N/A;   FLEXIBLE SIGMOIDOSCOPY N/A 06/04/2021   Procedure: FLEXIBLE SIGMOIDOSCOPY;  Surgeon: Teressa Toribio SQUIBB, MD;  [facility];  Service: Endoscopy;  Laterality: N/A;   FLEXIBLE SIGMOIDOSCOPY N/A 08/12/2022   Procedure: FLEXIBLE SIGMOIDOSCOPY;  Surgeon: Wilhelmenia Aloha Raddle., MD;  Location: [facility];  Service: Gastroenterology;  Laterality: N/A;   GIVENS CAPSULE STUDY N/A 05/29/2024   Procedure: IMAGING PROCEDURE, GI TRACT, INTRALUMINAL, VIA CAPSULE;  Surgeon: San Sandor GAILS, DO;  [facility];  Service: Gastroenterology;  Laterality: N/A;   INGUINAL HERNIA REPAIR     INGUINAL HERNIA REPAIR Left 01/07/2023   Procedure: OPEN LEFT INGUINAL HERNIA REPAIR;  Surgeon: Signe Mitzie LABOR, MD;  [facility];  Service: General;  Laterality: Left;   LAPAROSCOPIC SUBTOTAL COLECTOMY N/A 09/17/2016   Procedure: DIAGNOSTIC  LAPAROSCOPY EXPLORATORY LAPAROTOMY SUBTOTAL COLECTOMY PROCTOSCOPY;  Surgeon: Elon Pacini, MD;  [facility];  Service: General;  Laterality: N/A;   MUSCLE BIOPSY  03/01/2024   Procedure: BIOPSY;  Surgeon: Wilhelmenia Aloha Raddle., MD;  [facility];  Service: Gastroenterology;;   polyp removal at [facility]  2017   POLYPECTOMY     POLYPECTOMY  08/12/2022   Procedure: POLYPECTOMY;  Surgeon: Wilhelmenia Aloha Raddle., MD;  Location: [facility];  Service: Gastroenterology;;   PROCTOSCOPY  09/17/2016   Procedure: PROCTOSCOPY;  Surgeon: Elon Pacini, MD;  [facility];  Service: General;;   SIGMOIDOSCOPY     TRANSFORAMINAL LUMBAR INTERBODY FUSION W/ MIS 1 LEVEL Right 02/02/2021   Procedure: Right Lumbar four -lumbar five minimally invasive transforaminal lumbar interbody fusion;  Surgeon: Cheryle Debby LABOR, MD;  [facility];  Service: Neurosurgery;  Laterality: Right;   UPPER GASTROINTESTINAL ENDOSCOPY     UPPER GI ENDOSCOPY     Patient Active Problem List   Diagnosis Date Noted   Gastritis and gastroduodenitis 05/29/2024   Barrett's esophagus without dysplasia 05/29/2024   History of colon cancer 05/29/2024   Acute lower GI bleeding 05/28/2024   Personal history of colon cancer 03/01/2024   Cervical disc disorder with radiculopathy 09/02/2023   Spinal stenosis in cervical region 09/02/2023   Gastroesophageal reflux disease with esophagitis without hemorrhage 01/13/2023   Difficult intubation 02/23/2021   Elevated LFTs 02/07/2021   Lumbar radiculopathy 02/02/2021   Hyponatremia 11/07/2020   Hypokalemia 11/07/2020   Foraminal stenosis of lumbar region 12/28/2019   Lumbar spondylolysis 12/28/2019   Radiculopathy of lumbosacral region 12/05/2019   Essential hypertension 09/26/2019   Chronic right-sided low back pain with right-sided sciatica 09/26/2019   Recurrent right inguinal hernia 06/26/2019   Hypertriglyceridemia 11/24/2018   Anxiety 02/16/2018    Elevated CEA    [name]    Cancer of transverse colon (HCC) 09/17/2016   S/P colonoscopic polypectomy    Hematochezia    Depression 06/01/2016   Alcohol abuse, in remission 06/01/2016   Tobacco abuse 06/01/2016   Vitamin D  deficiency 10/17/2015   Hyperlipidemia 10/17/2015   History of malignant neoplasm of colon without staging 09/22/2010    REFERRING PROVIDER: Dorn FABIENE Glade MD  REFERRING DIAG: Lumbar pseudoarthrosis  Rationale for Evaluation and Treatment: Rehabilitation  THERAPY DIAG:  Other low back pain  Abnormal posture  Muscle spasm of back  ONSET DATE: 2 months ago.  SUBJECTIVE:  SUBJECTIVE STATEMENT: Pt states his R knee is bothering him more today. Feels it most in the front of his knee. Pain mostly occurs with weight bearing.   PERTINENT HISTORY:  Prior lumbar surgery.    PAIN:  Are you having pain? Yes: NPRS scale: 4/10. Pain location: Right low back.  Pain description: Ache, throbbing, shooting.   Aggravating factors: As above.   Relieving factors: Sitting.    PRECAUTIONS: None  RED FLAGS: None   WEIGHT BEARING RESTRICTIONS: No  FALLS:  Has patient fallen in last 6 months? Yes. Number of falls 1.    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    PLOF: Independent  PATIENT GOALS: Decrease pain and move better.     OBJECTIVE:   PATIENT SURVEYS:  ODI:  23/50.   POSTURE: rounded shoulders, forward head, and flexed trunk   PALPATION: Very tender to palpation over right lower lumbar/SIJ region.   LUMBAR ROM:   Full flexion and extension limited to 10 degrees.   LOWER EXTREMITY MMT:    Right hip flexion decreased (4/5) in response to pain.    LUMBAR  SPECIAL TESTS:  Pain with a right SLR and FABER test.     GAIT: When standing from chair and beginning to walk his gait was very antalgic with a notable limp.    TREATMENT DATE:  12/03/24: Nustep L5 x 10 min UEs/LEs Supine bent knee fall outs 2x10 Supine hip flexor/quad stretch 2x 30 Supine heel slide 2x10 Supine PPT 2x10 Supine hip flexion and ER PROM Supine hip distraction shotgun technique Supine inferior hip mob grade II to III Supine posterior hip mob grade II to III Sidelying STM & TPR R lumbar paraspinals, quadratus lumborum, into glute max HMP and using pt's TENS unit x 15 min along quadratus lumborum   11/28/24:  HMP and IFC at 80-150 Hz on 40% scanx 20 minutes.    Normal modality resposne following removal of modality.                                                                                                                                 PATIENT EDUCATION:  Education details: HEP Person educated: Patient Education method: verbal, handout Education comprehension: Demonstration  HOME EXERCISE PROGRAM: Access Code: [email or web address] URL: [email or web address] Date: 12/03/2024 Prepared by: Schylar Allard April Earnie Starring  Exercises - Bent Knee Fallouts  - 1 x daily - 7 x weekly - 2 sets - 10 reps - 3 sec hold - Supine Posterior Pelvic Tilt  - 1 x daily - 7 x weekly - 2 sets - 10 reps - 3 sec hold  ASSESSMENT:  CLINICAL IMPRESSION: Treatment focused on gentle hip mobility. Initiated core strengthening with pt demonstrating good TSA activation today. Hypomobile in R femoracetabular joint -- provided gentle joint mobilizations today with pt feeling best when performing inferior mobs.   OBJECTIVE  IMPAIRMENTS: Abnormal gait, decreased activity tolerance, difficulty walking, decreased ROM, increased muscle spasms, postural dysfunction, and pain.   ACTIVITY LIMITATIONS: carrying, lifting, bending, standing, transfers, and locomotion level  PARTICIPATION  LIMITATIONS: meal prep, cleaning, laundry, occupation, and yard work  PERSONAL FACTORS: Time since onset of injury/illness/exacerbation and 1 comorbidity: prior lumbar surgery are also affecting patient's functional outcome.   REHAB POTENTIAL: Fair /Fair+  CLINICAL DECISION MAKING: Evolving/moderate complexity  EVALUATION COMPLEXITY: Low   GOALS:  SHORT TERM GOALS: Target date: 01/02/25  Ind with a HEP. Goal status: INITIAL  2.  Eliminate right LE pain.  Goal status: INITIAL  3.  Perform ADL's with pain not > 4/10.  Goal status: INITIAL  4.  Improve LEFS score by at least 5 points.  Goal status: INITIAL  Goal status: INITIAL  PLAN:  PT FREQUENCY: 2x/week  PT DURATION: 4 weeks  PLANNED INTERVENTIONS: 97110-Therapeutic exercises, 97530- Therapeutic activity, [id number]- Neuromuscular re-education, 97535- Self Care, [id number]- Manual therapy, and Patient/Family education.  Electrical stimulation, ultrasound, moist heat.    PLAN FOR NEXT SESSION: Modalities and STW/M as needed.  Core exercise progression.  Body mechanics training and spinal protection techniques.      Lyla Jasek April Ma L Benedetta Sundstrom, PT 12/03/2024, 3:18 PM  "

## 2024-12-12 ENCOUNTER — Ambulatory Visit

## 2024-12-12 DIAGNOSIS — R293 Abnormal posture: Secondary | ICD-10-CM

## 2024-12-12 DIAGNOSIS — M5459 Other low back pain: Secondary | ICD-10-CM

## 2024-12-12 DIAGNOSIS — M62830 Muscle spasm of back: Secondary | ICD-10-CM

## 2024-12-12 NOTE — Therapy (Signed)
 " OUTPATIENT PHYSICAL THERAPY TREATMENT   Patient Name: Charles Santos MRN: [id number] DOB:15-Sep-1971, 53 y.o., male Today's Date: 12/12/2024  END OF SESSION:  PT End of Session - 12/12/24 0848     Visit Number 3    Number of Visits 8    Date for Recertification  01/02/25    PT Start Time 0845    PT Stop Time 0939    PT Time Calculation (min) 54 min    Activity Tolerance Patient tolerated treatment well    Behavior During Therapy [facility] for tasks assessed/performed           Past Medical History:  Diagnosis Date   Acute midline low back pain with bilateral sciatica 05/16/2020   Anxiety    Arthritis    Cancer of transverse colon (HCC) 09/17/2016   surgery done   CAP (community acquired pneumonia) 11/07/2020   Colon cancer (HCC) 1998   surgery and chemo   Complication of anesthesia    not fully under for polyp removal [facility]   Depression    Emphysema of lung (HCC)    Family hx of colon cancer    GERD (gastroesophageal reflux disease)    none recent   Headache    Hernia, inguinal    Left   Hyperlipidemia    Hypertension    Pneumonia    Sepsis (HCC) 11/07/2020   Vitamin D  deficiency    Past Surgical History:  Procedure Laterality Date   APPENDECTOMY     BACK SURGERY     L4-L5 minimally invasive transforaminal lumbar interbody fusion with bilateral L4-L5 pedicle screw placement   BIOPSY  08/12/2022   Procedure: BIOPSY;  Surgeon: Wilhelmenia Aloha Raddle., MD;  [facility];  Service: Gastroenterology;;   BIOPSY OF SKIN SUBCUTANEOUS TISSUE AND/OR MUCOUS MEMBRANE  05/29/2024   Procedure: BIOPSY, SKIN, SUBCUTANEOUS TISSUE, OR MUCOUS MEMBRANE;  Surgeon: San Sandor GAILS, DO;  [facility];  Service: Gastroenterology;;   colon resectomy  1998   2 degree herida   COLONOSCOPY     COLONOSCOPY WITH PROPOFOL  N/A 03/01/2024   Procedure: COLONOSCOPY WITH PROPOFOL ;  Surgeon: Wilhelmenia Aloha Raddle., MD;  Location: [facility];  Service:  Gastroenterology;  Laterality: N/A;   ESOPHAGOGASTRODUODENOSCOPY N/A 05/29/2024   Procedure: EGD (ESOPHAGOGASTRODUODENOSCOPY);  Surgeon: San Sandor GAILS, DO;  [facility];  Service: Gastroenterology;  Laterality: N/A;   ESOPHAGOGASTRODUODENOSCOPY (EGD) WITH PROPOFOL  N/A 05/19/2017   Procedure: ESOPHAGOGASTRODUODENOSCOPY (EGD) WITH PROPOFOL ;  Surgeon: Teressa Toribio SQUIBB, MD;  [facility];  Service: Endoscopy;  Laterality: N/A;   ESOPHAGOGASTRODUODENOSCOPY (EGD) WITH PROPOFOL  N/A 06/04/2021   Procedure: ESOPHAGOGASTRODUODENOSCOPY (EGD) WITH PROPOFOL ;  Surgeon: Teressa Toribio SQUIBB, MD;  [facility];  Service: Endoscopy;  Laterality: N/A;   ESOPHAGOGASTRODUODENOSCOPY (EGD) WITH PROPOFOL  N/A 03/01/2024   Procedure: ESOPHAGOGASTRODUODENOSCOPY (EGD) WITH PROPOFOL ;  Surgeon: Wilhelmenia Aloha Raddle., MD;  [facility];  Service: Gastroenterology;  Laterality: N/A;   FLEXIBLE SIGMOIDOSCOPY N/A 05/19/2017   Procedure: FLEXIBLE SIGMOIDOSCOPY;  Surgeon: Teressa Toribio SQUIBB, MD;  [facility];  Service: Endoscopy;  Laterality: N/A;   FLEXIBLE SIGMOIDOSCOPY N/A 06/04/2021   Procedure: FLEXIBLE SIGMOIDOSCOPY;  Surgeon: Teressa Toribio SQUIBB, MD;  [facility];  Service: Endoscopy;  Laterality: N/A;   FLEXIBLE SIGMOIDOSCOPY N/A 08/12/2022   Procedure: FLEXIBLE SIGMOIDOSCOPY;  Surgeon: Wilhelmenia Aloha Raddle., MD;  Location: [facility];  Service: Gastroenterology;  Laterality: N/A;   GIVENS CAPSULE STUDY N/A 05/29/2024   Procedure: IMAGING PROCEDURE, GI TRACT, INTRALUMINAL, VIA CAPSULE;  Surgeon: San Sandor GAILS, DO;  [facility];  Service: Gastroenterology;  Laterality: N/A;   INGUINAL HERNIA REPAIR     INGUINAL HERNIA REPAIR Left 01/07/2023   Procedure: OPEN LEFT INGUINAL HERNIA REPAIR;  Surgeon: Signe Mitzie LABOR, MD;  [facility];  Service: General;  Laterality: Left;   LAPAROSCOPIC SUBTOTAL COLECTOMY N/A 09/17/2016   Procedure: DIAGNOSTIC  LAPAROSCOPY EXPLORATORY LAPAROTOMY SUBTOTAL COLECTOMY PROCTOSCOPY;  Surgeon: Elon Pacini, MD;  [facility];  Service: General;  Laterality: N/A;   MUSCLE BIOPSY  03/01/2024   Procedure: BIOPSY;  Surgeon: Wilhelmenia Aloha Raddle., MD;  [facility];  Service: Gastroenterology;;   polyp removal at [facility]  2017   POLYPECTOMY     POLYPECTOMY  08/12/2022   Procedure: POLYPECTOMY;  Surgeon: Wilhelmenia Aloha Raddle., MD;  Location: [facility];  Service: Gastroenterology;;   PROCTOSCOPY  09/17/2016   Procedure: PROCTOSCOPY;  Surgeon: Elon Pacini, MD;  [facility];  Service: General;;   SIGMOIDOSCOPY     TRANSFORAMINAL LUMBAR INTERBODY FUSION W/ MIS 1 LEVEL Right 02/02/2021   Procedure: Right Lumbar four -lumbar five minimally invasive transforaminal lumbar interbody fusion;  Surgeon: Cheryle Debby LABOR, MD;  Location: [facility];  Service: Neurosurgery;  Laterality: Right;   UPPER GASTROINTESTINAL ENDOSCOPY     UPPER GI ENDOSCOPY     Patient Active Problem List   Diagnosis Date Noted   Gastritis and gastroduodenitis 05/29/2024   Barrett's esophagus without dysplasia 05/29/2024   History of colon cancer 05/29/2024   Acute lower GI bleeding 05/28/2024   Personal history of colon cancer 03/01/2024   Cervical disc disorder with radiculopathy 09/02/2023   Spinal stenosis in cervical region 09/02/2023   Gastroesophageal reflux disease with esophagitis without hemorrhage 01/13/2023   Difficult intubation 02/23/2021   Elevated LFTs 02/07/2021   Lumbar radiculopathy 02/02/2021   Hyponatremia 11/07/2020   Hypokalemia 11/07/2020   Foraminal stenosis of lumbar region 12/28/2019   Lumbar spondylolysis 12/28/2019   Radiculopathy of lumbosacral region 12/05/2019   Essential hypertension 09/26/2019   Chronic right-sided low back pain with right-sided sciatica 09/26/2019   Recurrent right inguinal hernia 06/26/2019   Hypertriglyceridemia 11/24/2018   Anxiety 02/16/2018    Elevated CEA    [name]    Cancer of transverse colon (HCC) 09/17/2016   S/P colonoscopic polypectomy    Hematochezia    Depression 06/01/2016   Alcohol abuse, in remission 06/01/2016   Tobacco abuse 06/01/2016   Vitamin D  deficiency 10/17/2015   Hyperlipidemia 10/17/2015   History of malignant neoplasm of colon without staging 09/22/2010    REFERRING PROVIDER: Dorn FABIENE Glade MD  REFERRING DIAG: Lumbar pseudoarthrosis  Rationale for Evaluation and Treatment: Rehabilitation  THERAPY DIAG:  Other low back pain  Abnormal posture  Muscle spasm of back  ONSET DATE: 2 months ago.  SUBJECTIVE:  SUBJECTIVE STATEMENT: Pt reports 9/10 low back pain and tightness today.   PERTINENT HISTORY:  Prior lumbar surgery.    PAIN:  Are you having pain? Yes: NPRS scale: 9/10. Pain location: Right low back.  Pain description: Ache, throbbing, shooting.   Aggravating factors: As above.   Relieving factors: Sitting.    PRECAUTIONS: None  RED FLAGS: None   WEIGHT BEARING RESTRICTIONS: No  FALLS:  Has patient fallen in last 6 months? Yes. Number of falls 1.    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    PLOF: Independent  PATIENT GOALS: Decrease pain and move better.     OBJECTIVE:   PATIENT SURVEYS:  ODI:  23/50.   POSTURE: rounded shoulders, forward head, and flexed trunk   PALPATION: Very tender to palpation over right lower lumbar/SIJ region.   LUMBAR ROM:   Full flexion and extension limited to 10 degrees.   LOWER EXTREMITY MMT:    Right hip flexion decreased (4/5) in response to pain.    LUMBAR SPECIAL TESTS:  Pain with a right SLR and FABER test.     GAIT: When standing  from chair and beginning to walk his gait was very antalgic with a notable limp.    TREATMENT DATE:    12/12/24                                  EXERCISE LOG  Exercise Repetitions and Resistance Comments  Nustep Lvl 5 x 17 mins   SKTC 10 reps x 5 sec hold bil   LTR 10 reps x 5 sec hold bil        Blank cell = exercise not performed today   Modalities  Date:  Unattended Estim: Lumbar, IFC 80-150 hz, 20 mins, Pain and Tone Hot Pack: Lumbar, 20 mins, Pain and Tone    12/03/24: Nustep L5 x 10 min UEs/LEs Supine bent knee fall outs 2x10 Supine hip flexor/quad stretch 2x 30 Supine heel slide 2x10 Supine PPT 2x10 Supine hip flexion and ER PROM Supine hip distraction shotgun technique Supine inferior hip mob grade II to III Supine posterior hip mob grade II to III Sidelying STM & TPR R lumbar paraspinals, quadratus lumborum, into glute max HMP and using pt's TENS unit x 15 min along quadratus lumborum   11/28/24:  HMP and IFC at 80-150 Hz on 40% scanx 20 minutes.    Normal modality resposne following removal of modality.                                                                                                                                 PATIENT EDUCATION:  Education details: HEP Person educated: Patient Education method: verbal, handout Education comprehension: Demonstration  HOME EXERCISE PROGRAM: Access Code: [email or web address] URL: [email or web address] Date: 12/03/2024 Prepared  by: Gellen April Earnie Starring  Exercises - Bent Knee Fallouts  - 1 x daily - 7 x weekly - 2 sets - 10 reps - 3 sec hold - Supine Posterior Pelvic Tilt  - 1 x daily - 7 x weekly - 2 sets - 10 reps - 3 sec hold  ASSESSMENT:  CLINICAL IMPRESSION: Pt arrives for today's treatment session reporting 9/10 right low back pain.  Pt states that he has an MD appointment on Monday Jan 5.  Pt instructed in supine stretches with cues required for proper technique.  Normal responses to  estim and MH noted upon removal.  Pt reported 6/10 right low back pain at completion of today's treatment session.   OBJECTIVE IMPAIRMENTS: Abnormal gait, decreased activity tolerance, difficulty walking, decreased ROM, increased muscle spasms, postural dysfunction, and pain.   ACTIVITY LIMITATIONS: carrying, lifting, bending, standing, transfers, and locomotion level  PARTICIPATION LIMITATIONS: meal prep, cleaning, laundry, occupation, and yard work  PERSONAL FACTORS: Time since onset of injury/illness/exacerbation and 1 comorbidity: prior lumbar surgery are also affecting patient's functional outcome.   REHAB POTENTIAL: Fair /Fair+  CLINICAL DECISION MAKING: Evolving/moderate complexity  EVALUATION COMPLEXITY: Low   GOALS:  SHORT TERM GOALS: Target date: 01/02/25  Ind with a HEP. Goal status: INITIAL  2.  Eliminate right LE pain.  Goal status: INITIAL  3.  Perform ADL's with pain not > 4/10.  Goal status: INITIAL  4.  Improve LEFS score by at least 5 points.  Goal status: INITIAL  Goal status: INITIAL  PLAN:  PT FREQUENCY: 2x/week  PT DURATION: 4 weeks  PLANNED INTERVENTIONS: 97110-Therapeutic exercises, 97530- Therapeutic activity, [id number]- Neuromuscular re-education, 97535- Self Care, 02859- Manual therapy, and Patient/Family education.  Electrical stimulation, ultrasound, moist heat.    PLAN FOR NEXT SESSION: Modalities and STW/M as needed.  Core exercise progression.  Body mechanics training and spinal protection techniques.      Delon DELENA Gosling, PTA 12/12/2024, 9:43 AM  "

## 2024-12-18 ENCOUNTER — Ambulatory Visit: Admitting: Physical Therapy

## 2024-12-18 ENCOUNTER — Encounter: Payer: Self-pay | Admitting: Physical Therapy

## 2024-12-18 DIAGNOSIS — R293 Abnormal posture: Secondary | ICD-10-CM | POA: Insufficient documentation

## 2024-12-18 DIAGNOSIS — M5459 Other low back pain: Secondary | ICD-10-CM | POA: Insufficient documentation

## 2024-12-18 DIAGNOSIS — M62830 Muscle spasm of back: Secondary | ICD-10-CM | POA: Insufficient documentation

## 2024-12-18 NOTE — Therapy (Signed)
 " OUTPATIENT PHYSICAL THERAPY TREATMENT   Patient Name: Charles Santos MRN: [id number] DOB:June 28, 1971, 54 y.o., male Today's Date: 12/18/2024  END OF SESSION:  PT End of Session - 12/18/24 1433     Visit Number 4    Number of Visits 8    Date for Recertification  01/02/25    PT Start Time 1430    PT Stop Time 1510    PT Time Calculation (min) 40 min    Activity Tolerance Patient tolerated treatment well    Behavior During Therapy [facility] for tasks assessed/performed            Past Medical History:  Diagnosis Date   Acute midline low back pain with bilateral sciatica 05/16/2020   Anxiety    Arthritis    Cancer of transverse colon (HCC) 09/17/2016   surgery done   CAP (community acquired pneumonia) 11/07/2020   Colon cancer (HCC) 1998   surgery and chemo   Complication of anesthesia    not fully under for polyp removal at [facility] 2017   Depression    Emphysema of lung (HCC)    Family hx of colon cancer    GERD (gastroesophageal reflux disease)    none recent   Headache    Hernia, inguinal    Left   Hyperlipidemia    Hypertension    Pneumonia    Sepsis (HCC) 11/07/2020   Vitamin D  deficiency    Past Surgical History:  Procedure Laterality Date   APPENDECTOMY     BACK SURGERY     L4-L5 minimally invasive transforaminal lumbar interbody fusion with bilateral L4-L5 pedicle screw placement   BIOPSY  08/12/2022   Procedure: BIOPSY;  Surgeon: Wilhelmenia Aloha Raddle., MD;  Location: [facility];  Service: Gastroenterology;;   BIOPSY OF SKIN SUBCUTANEOUS TISSUE AND/OR MUCOUS MEMBRANE  05/29/2024   Procedure: BIOPSY, SKIN, SUBCUTANEOUS TISSUE, OR MUCOUS MEMBRANE;  Surgeon: San Sandor GAILS, DO;  Location: [facility];  Service: Gastroenterology;;   colon resectomy  1998   2 degree herida   COLONOSCOPY     COLONOSCOPY WITH PROPOFOL  N/A 03/01/2024   Procedure: COLONOSCOPY WITH PROPOFOL ;  Surgeon: Wilhelmenia Aloha Raddle., MD;  Location: [facility];  Service:  Gastroenterology;  Laterality: N/A;   ESOPHAGOGASTRODUODENOSCOPY N/A 05/29/2024   Procedure: EGD (ESOPHAGOGASTRODUODENOSCOPY);  Surgeon: San Sandor GAILS, DO;  [facility]: [facility];  Service: Gastroenterology;  Laterality: N/A;   ESOPHAGOGASTRODUODENOSCOPY (EGD) WITH PROPOFOL  N/A 05/19/2017   Procedure: ESOPHAGOGASTRODUODENOSCOPY (EGD) WITH PROPOFOL ;  Surgeon: Teressa Toribio SQUIBB, MD;  [facility]: [facility];  Service: Endoscopy;  Laterality: N/A;   ESOPHAGOGASTRODUODENOSCOPY (EGD) WITH PROPOFOL  N/A 06/04/2021   Procedure: ESOPHAGOGASTRODUODENOSCOPY (EGD) WITH PROPOFOL ;  Surgeon: Teressa Toribio SQUIBB, MD;  [facility];  Service: Endoscopy;  Laterality: N/A;   ESOPHAGOGASTRODUODENOSCOPY (EGD) WITH PROPOFOL  N/A 03/01/2024   Procedure: ESOPHAGOGASTRODUODENOSCOPY (EGD) WITH PROPOFOL ;  Surgeon: Wilhelmenia Aloha Raddle., MD;  [facility];  Service: Gastroenterology;  Laterality: N/A;   FLEXIBLE SIGMOIDOSCOPY N/A 05/19/2017   Procedure: FLEXIBLE SIGMOIDOSCOPY;  Surgeon: Teressa Toribio SQUIBB, MD;  [facility];  Service: Endoscopy;  Laterality: N/A;   FLEXIBLE SIGMOIDOSCOPY N/A 06/04/2021   Procedure: FLEXIBLE SIGMOIDOSCOPY;  Surgeon: Teressa Toribio SQUIBB, MD;  [facility];  Service: Endoscopy;  Laterality: N/A;   FLEXIBLE SIGMOIDOSCOPY N/A 08/12/2022   Procedure: FLEXIBLE SIGMOIDOSCOPY;  Surgeon: Wilhelmenia Aloha Raddle., MD;  Location: [facility];  Service: Gastroenterology;  Laterality: N/A;   GIVENS CAPSULE STUDY N/A 05/29/2024   Procedure: IMAGING PROCEDURE, GI TRACT, INTRALUMINAL, VIA  CAPSULE;  Surgeon: San Sandor GAILS, DO;  [facility];  Service: Gastroenterology;  Laterality: N/A;   INGUINAL HERNIA REPAIR     INGUINAL HERNIA REPAIR Left 01/07/2023   Procedure: OPEN LEFT INGUINAL HERNIA REPAIR;  Surgeon: Signe Mitzie LABOR, MD;  [facility]: [facility];  Service: General;  Laterality: Left;   LAPAROSCOPIC SUBTOTAL COLECTOMY N/A 09/17/2016   Procedure: DIAGNOSTIC  LAPAROSCOPY EXPLORATORY LAPAROTOMY SUBTOTAL COLECTOMY PROCTOSCOPY;  Surgeon: Elon Pacini, MD;  [facility];  Service: General;  Laterality: N/A;   MUSCLE BIOPSY  03/01/2024   Procedure: BIOPSY;  Surgeon: Wilhelmenia Aloha Raddle., MD;  [facility]: [facility];  Service: Gastroenterology;;   polyp removal at [facility]  2017   POLYPECTOMY     POLYPECTOMY  08/12/2022   Procedure: POLYPECTOMY;  Surgeon: Wilhelmenia Aloha Raddle., MD;  Location: [facility];  Service: Gastroenterology;;   PROCTOSCOPY  09/17/2016   Procedure: PROCTOSCOPY;  Surgeon: Elon Pacini, MD;  [facility]: [facility];  Service: General;;   SIGMOIDOSCOPY     TRANSFORAMINAL LUMBAR INTERBODY FUSION W/ MIS 1 LEVEL Right 02/02/2021   Procedure: Right Lumbar four -lumbar five minimally invasive transforaminal lumbar interbody fusion;  Surgeon: Cheryle Debby LABOR, MD;  Location: [facility];  Service: Neurosurgery;  Laterality: Right;   UPPER GASTROINTESTINAL ENDOSCOPY     UPPER GI ENDOSCOPY     Patient Active Problem List   Diagnosis Date Noted   Gastritis and gastroduodenitis 05/29/2024   Barrett's esophagus without dysplasia 05/29/2024   History of colon cancer 05/29/2024   Acute lower GI bleeding 05/28/2024   Personal history of colon cancer 03/01/2024   Cervical disc disorder with radiculopathy 09/02/2023   Spinal stenosis in cervical region 09/02/2023   Gastroesophageal reflux disease with esophagitis without hemorrhage 01/13/2023   Difficult intubation 02/23/2021   Elevated LFTs 02/07/2021   Lumbar radiculopathy 02/02/2021   Hyponatremia 11/07/2020   Hypokalemia 11/07/2020   Foraminal stenosis of lumbar region 12/28/2019   Lumbar spondylolysis 12/28/2019   Radiculopathy of lumbosacral region 12/05/2019   Essential hypertension 09/26/2019   Chronic right-sided low back pain with right-sided sciatica 09/26/2019   Recurrent right inguinal hernia 06/26/2019   Hypertriglyceridemia 11/24/2018   Anxiety 02/16/2018    Elevated CEA    [name]    Cancer of transverse colon (HCC) 09/17/2016   S/P colonoscopic polypectomy    Hematochezia    Depression 06/01/2016   Alcohol abuse, in remission 06/01/2016   Tobacco abuse 06/01/2016   Vitamin D  deficiency 10/17/2015   Hyperlipidemia 10/17/2015   History of malignant neoplasm of colon without staging 09/22/2010    REFERRING PROVIDER: Dorn FABIENE Glade MD  REFERRING DIAG: Lumbar pseudoarthrosis  Rationale for Evaluation and Treatment: Rehabilitation  THERAPY DIAG:  Other low back pain  Abnormal posture  Muscle spasm of back  ONSET DATE: 2 months ago.  SUBJECTIVE:  SUBJECTIVE STATEMENT: Pt states he got his MRI done -- will get a reading in the next 5 days. States it had hurt a lot. Was feeling fine yesterday after MRI until this morning. Has been using his TENS unit at home. Has been trying to sleep with pillow between his knees but it slips out when he turns at night.   PERTINENT HISTORY:  Prior lumbar surgery.    PAIN:  Are you having pain? Yes: NPRS scale: 9/10. Pain location: Right low back.  Pain description: Ache, throbbing, shooting.   Aggravating factors: As above.   Relieving factors: Sitting.    PRECAUTIONS: None  RED FLAGS: None   WEIGHT BEARING RESTRICTIONS: No  FALLS:  Has patient fallen in last 6 months? Yes. Number of falls 1.    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    PLOF: Independent  PATIENT GOALS: Decrease pain and move better.     OBJECTIVE:   PATIENT SURVEYS:  ODI:  23/50.   POSTURE: rounded shoulders, forward head, and flexed trunk   PALPATION: Very tender to palpation over right lower lumbar/SIJ region.   LUMBAR  ROM:  Full flexion and extension limited to 10 degrees.  LOWER EXTREMITY MMT:   Right hip flexion decreased (4/5) in response to pain.    LUMBAR SPECIAL TESTS:  Pain with a right SLR and FABER test.     GAIT: When standing from chair and beginning to walk his gait was very antalgic with a notable limp.    TREATMENT DATE:  12/18/24 Nustep L1 x 15 min Standing lumbar ext against counter x10 Standing L stretch x 10 Standing L<>R weight shift x10 Standing hip ext R x10 Standing hamstring curl R x10 Sidelying clamshell 2x10 Sidelying hip flexion AAROM x10 Sidelying hip ext AAROM x10 Manual therapy STM & TPR glute med/max and piriformis Unattended Estim: Lumbar, IFC 80-150 hz, 20 mins, Pain and Tone Hot Pack: Lumbar, 20 mins, Pain and Tone   12/12/24                                  EXERCISE LOG  Exercise Repetitions and Resistance Comments  Nustep Lvl 5 x 17 mins   SKTC 10 reps x 5 sec hold bil   LTR 10 reps x 5 sec hold bil        Blank cell = exercise not performed today   Modalities  Date:  Unattended Estim: Lumbar, IFC 80-150 hz, 20 mins, Pain and Tone Hot Pack: Lumbar, 20 mins, Pain and Tone    12/03/24: Nustep L5 x 10 min UEs/LEs Supine bent knee fall outs 2x10 Supine hip flexor/quad stretch 2x 30 Supine heel slide 2x10 Supine PPT 2x10 Supine hip flexion and ER PROM Supine hip distraction shotgun technique Supine inferior hip mob grade II to III Supine posterior hip mob grade II to III Sidelying STM & TPR R lumbar paraspinals, quadratus lumborum, into glute max HMP and using pt's TENS unit x 15 min along quadratus lumborum   11/28/24:  HMP and IFC at 80-150 Hz on 40% scanx 20 minutes.    Normal modality resposne following removal of modality.  PATIENT EDUCATION:  Education details: HEP Person educated: Patient Education  method: verbal, handout Education comprehension: Demonstration  HOME EXERCISE PROGRAM: Access Code: [email or web address] URL: [email or web address] Date: 12/03/2024 Prepared by: Paulita Licklider April Earnie Starring  Exercises - Bent Knee Fallouts  - 1 x daily - 7 x weekly - 2 sets - 10 reps - 3 sec hold - Supine Posterior Pelvic Tilt  - 1 x daily - 7 x weekly - 2 sets - 10 reps - 3 sec hold  ASSESSMENT:  CLINICAL IMPRESSION: Pt comes in with increased pain after sitting in waiting room. Highly antalgic gait pattern. Some R hip aggravation with Nustep today so did not perform as long. Manual therapy to try and address R hip/posterior pelvic pain. Continued e-stim. End of session, pt remained highly antalgic with difficulty placing weight on R LE without sharp shooting pain.   OBJECTIVE IMPAIRMENTS: Abnormal gait, decreased activity tolerance, difficulty walking, decreased ROM, increased muscle spasms, postural dysfunction, and pain.   ACTIVITY LIMITATIONS: carrying, lifting, bending, standing, transfers, and locomotion level  PARTICIPATION LIMITATIONS: meal prep, cleaning, laundry, occupation, and yard work  PERSONAL FACTORS: Time since onset of injury/illness/exacerbation and 1 comorbidity: prior lumbar surgery are also affecting patient's functional outcome.   REHAB POTENTIAL: Fair /Fair+  CLINICAL DECISION MAKING: Evolving/moderate complexity  EVALUATION COMPLEXITY: Low   GOALS:  SHORT TERM GOALS: Target date: 01/02/25  Ind with a HEP. Goal status: INITIAL  2.  Eliminate right LE pain.  Goal status: INITIAL  3.  Perform ADL's with pain not > 4/10.  Goal status: INITIAL  4.  Improve LEFS score by at least 5 points.  Goal status: INITIAL  Goal status: INITIAL  PLAN:  PT FREQUENCY: 2x/week  PT DURATION: 4 weeks  PLANNED INTERVENTIONS: 97110-Therapeutic exercises, 97530- Therapeutic activity, [id number]- Neuromuscular re-education, 97535- Self Care, [id number]- Manual therapy, and  Patient/Family education.  Electrical stimulation, ultrasound, moist heat.    PLAN FOR NEXT SESSION: Modalities and STW/M as needed.  Core exercise progression.  Body mechanics training and spinal protection techniques.      Eviana Sibilia April Ma L Quayshawn Nin, PT 12/18/2024, 3:20 PM  "

## 2024-12-19 ENCOUNTER — Ambulatory Visit: Payer: Self-pay | Admitting: Nurse Practitioner

## 2024-12-19 ENCOUNTER — Encounter: Payer: Self-pay | Admitting: Family Medicine

## 2024-12-19 ENCOUNTER — Ambulatory Visit: Admitting: Family Medicine

## 2024-12-19 ENCOUNTER — Ambulatory Visit: Payer: Self-pay | Admitting: *Deleted

## 2024-12-19 VITALS — BP 136/81 | HR 68 | Temp 97.8°F | Ht 70.0 in

## 2024-12-19 DIAGNOSIS — M5417 Radiculopathy, lumbosacral region: Secondary | ICD-10-CM

## 2024-12-19 MED ORDER — METHYLPREDNISOLONE ACETATE 80 MG/ML IJ SUSP
60.0000 mg | Freq: Once | INTRAMUSCULAR | Status: AC
  Administered 2024-12-19: 60 mg via INTRAMUSCULAR

## 2024-12-19 MED ORDER — METHYLPREDNISOLONE ACETATE 80 MG/ML IJ SUSP: 60.0000 mg | Freq: Once | INTRAMUSCULAR | Status: DC

## 2024-12-19 NOTE — Telephone Encounter (Signed)
 FYI Only or Action Required?: Action required by provider: request for appointment.  Patient was last seen in primary care on 10/18/2024 by Zollie Lowers, MD.  Called Nurse Triage reporting Leg Pain.  Symptoms began yesterday.  Interventions attempted: Rest, hydration, or home remedies.  Symptoms are: gradually worsening.  Triage Disposition: See HCP Within 4 Hours (Or PCP Triage)  Patient/caregiver understands and will follow disposition?: Yes Patient is calling from office parking lot- patient was unable to walk after PT yesterday and had to leave work today. Patient is in office parking lot - wants to see if he can be seen today. No open appointment- called CAL, lisa and see took the call.   Copied from CRM [id number]. Topic: Clinical - Red Word Triage >> Dec 19, 2024  7:56 AM Larissa RAMAN wrote: [facility] that prompted transfer to Nurse Triage: difficulty walking after PT, RT leg pain Reason for Disposition  [1] SEVERE pain (e.g., excruciating, unable to do any normal activities) AND [2] not improved after 2 hours of pain medicine  Answer Assessment - Initial Assessment Questions 1. ONSET: When did the pain start?      Patient states he had PT yesterday- could not walk afterward. Patient went to work today and had to leave due to pain and unable to walk. Patient fell over 1 month ago- tripped 2. LOCATION: Where is the pain located?      Knee into hip-right 3. PAIN: How bad is the pain?    (Scale 1-10; or mild, moderate, severe)     Severe with pressure to walk 4. WORK OR EXERCISE: Has there been any recent work or exercise that involved this part of the body?      Patient has injury 5. CAUSE: What do you think is causing the leg pain?     Not sure- injury to back 6. OTHER SYMPTOMS: Do you have any other symptoms? (e.g., chest pain, back pain, breathing difficulty, swelling, rash, fever, numbness, weakness)     no  Protocols used: Leg Pain-A-AH

## 2024-12-19 NOTE — Telephone Encounter (Signed)
 Noted. Patient is scheduled to be seen today.

## 2024-12-19 NOTE — Progress Notes (Signed)
 "  Acute Office Visit  Patient ID: Charles Santos, male    DOB: 1971/12/08, 54 y.o.   MRN: [id number]  PCP: Zollie Lowers, MD  Chief Complaint  Patient presents with   Leg Pain    Patient was doing PT yesterday when his right leg starting hurting. Patient says it hurts to even move his leg. On a scale of 1-10 patient reports pain at a 6 sitting down but when he stands up, the pain is a 10.     Subjective:     Leg Pain     Discussed the use of AI scribe software for clinical note transcription with the patient, who gave verbal consent to proceed.  History of Present Illness   Charles Santos is a 54 year old male with chronic back and leg pain who presents with worsening symptoms after a recent fall.  Back and leg pain - Chronic pain primarily on the right side, radiating from the lower back down the leg along the sciatic nerve distribution - Pain intensified after a fall approximately one month ago - Pain in the R knee down to the R ankle - Sitting provides partial relief; standing and walking exacerbate symptoms - Required wheelchair assistance after physical therapy session yesterday due to increased pain - No loss of bowel or bladder control  History of back surgery - Previous back surgery on the right side performed five to six years ago  Pain management and interventions - Daily use of Relafen  and Lyrica  for pain control - Followed by [facility] per patient - Recent lumbar MRI on 12/17/24, results pending.   Relevant medical history - No history of diabetes       ROS     Objective:    BP 136/81 (Cuff Size: Normal)   Pulse 68   Temp 97.8 F (36.6 C)   Ht 5' 10 (1.778 m)   SpO2 96%   BMI 33.72 kg/m    Physical Exam Vitals reviewed.  Constitutional:      Appearance: Normal appearance.     Comments: Patient in no observable distress  HENT:     Head: Normocephalic and atraumatic.  Eyes:     Extraocular Movements: Extraocular movements intact.      Conjunctiva/sclera: Conjunctivae normal.     Pupils: Pupils are equal, round, and reactive to light.  Cardiovascular:     Rate and Rhythm: Normal rate and regular rhythm.     Pulses: Normal pulses.     Heart sounds: Normal heart sounds. No murmur heard. Pulmonary:     Effort: Pulmonary effort is normal. No respiratory distress.     Breath sounds: Normal breath sounds.  Musculoskeletal:        General: No swelling or deformity.     Cervical back: Normal range of motion.     Comments: Decreased ROM to RLE due to pain with movement.   Patient required wheelchair while in office today due to R leg pain while ambulating.   Skin:    General: Skin is warm and dry.  Neurological:     General: No focal deficit present.     Mental Status: He is alert and oriented to person, place, and time.  Psychiatric:        Mood and Affect: Mood normal.        Behavior: Behavior normal.       No results found for any visits on 12/19/24.     Assessment & Plan:   Problem List Items  Addressed This Visit       Nervous and Auditory   Radiculopathy of lumbosacral region - Primary    Assessment and Plan    Lumbosacral radiculopathy Chronic right-sided lumbosacral radiculopathy. Recent exacerbation post-fall. MRI results pending. Followed by ortho.  - Administered 60 mg depomedrol in office today. Discussed potential side effects of steroids.  - Follow up with orthopedics. - Continue relafen  and lyrica .   Meds ordered this encounter  Medications   DISCONTD: methylPREDNISolone  acetate (DEPO-MEDROL ) injection 60 mg   methylPREDNISolone  acetate (DEPO-MEDROL ) injection 60 mg      Lochlyn Zullo W Deanna Wiater, FNP [facility]   "

## 2024-12-24 ENCOUNTER — Ambulatory Visit: Payer: Self-pay

## 2024-12-24 NOTE — Telephone Encounter (Signed)
 Patient reported symptoms have not improved and requested a doctor's note for work today. Okay for note?

## 2024-12-24 NOTE — Telephone Encounter (Signed)
 FYI Only or Action Required?: FYI only for provider: UC advised.  Patient was last seen in primary care on 12/19/2024 by Alcus Oneil ORN, FNP.  Called Nurse Triage reporting Leg Pain.  Symptoms began several months ago.  Interventions attempted: Other: steroid injection at OV on 12/19/24.  Symptoms are: unchanged.  Triage Disposition: See Physician Within 24 Hours  Patient/caregiver understands and will follow disposition?: Yes                                  1. ONSET: When did the pain start?      Ongoing for a couple of months, worsened last week at PT, OV on 12/19/24, no improvement since OV 2. LOCATION: Where is the pain located?      Right hip and back area, radiates to knee and calves when standing, new onset of right knee tension 3. PAIN: How bad is the pain?    (Scale 1-10; or mild, moderate, severe)     Sitting down- rates pain a 5, standing up- rates pain 8-10 5. CAUSE: What do you think is causing the leg pain?     Unsure 6. OTHER SYMPTOMS: Do you have any other symptoms? (e.g., chest pain, back pain, breathing difficulty, swelling, rash, fever, numbness, weakness)     Some numbness around buttock and hip, tension in right knee Denies redness, denies swelling, denies fever Able to ambulate with [place]    Patient was evaluated for symptoms at OV on 12/19/24. Patient reported symptoms have not improved and requested a doctor's note for work today. No availability at PCP office or surrounding offices within region today. This RN advised UC. Patient verbalized understanding and agreed to go.   Reason for Disposition  Numbness in a leg or foot (i.e., loss of sensation)  Protocols used: Leg Pain-A-AH  Copied from CRM #[id number]. Topic: Clinical - Red Word Triage >> Dec 24, 2024 12:32 PM Wess RAMAN wrote: Red Word that prompted transfer to Nurse Triage: Sharp pains in legs, difficulty walking  Would like appt with PCP

## 2024-12-26 ENCOUNTER — Other Ambulatory Visit: Payer: Self-pay

## 2024-12-26 ENCOUNTER — Ambulatory Visit: Admitting: Physical Therapy

## 2024-12-26 DIAGNOSIS — G959 Disease of spinal cord, unspecified: Secondary | ICD-10-CM

## 2025-01-04 ENCOUNTER — Other Ambulatory Visit

## 2025-01-10 ENCOUNTER — Ambulatory Visit
Admission: RE | Admit: 2025-01-10 | Discharge: 2025-01-10 | Disposition: A | Discharge status: Home / Self Care | Source: Ambulatory Visit

## 2025-01-10 DIAGNOSIS — G959 Disease of spinal cord, unspecified: Secondary | ICD-10-CM

## 2025-04-18 ENCOUNTER — Ambulatory Visit: Admitting: Family Medicine
